# Patient Record
Sex: Female | Born: 1980 | Race: Black or African American | Hispanic: No | Marital: Single | Smoking: Former smoker
Health system: Southern US, Community
[De-identification: ages and names within clinical notes are randomized; demographics above are authoritative.]

## PROBLEM LIST (undated history)

## (undated) DIAGNOSIS — B009 Herpesviral infection, unspecified: Secondary | ICD-10-CM

## (undated) DIAGNOSIS — O139 Gestational [pregnancy-induced] hypertension without significant proteinuria, unspecified trimester: Secondary | ICD-10-CM

## (undated) DIAGNOSIS — R011 Cardiac murmur, unspecified: Secondary | ICD-10-CM

## (undated) DIAGNOSIS — G35 Multiple sclerosis: Secondary | ICD-10-CM

## (undated) DIAGNOSIS — Z332 Encounter for elective termination of pregnancy: Secondary | ICD-10-CM

## (undated) DIAGNOSIS — B999 Unspecified infectious disease: Secondary | ICD-10-CM

## (undated) DIAGNOSIS — D649 Anemia, unspecified: Secondary | ICD-10-CM

## (undated) DIAGNOSIS — E559 Vitamin D deficiency, unspecified: Secondary | ICD-10-CM

## (undated) DIAGNOSIS — R937 Abnormal findings on diagnostic imaging of other parts of musculoskeletal system: Secondary | ICD-10-CM

## (undated) HISTORY — PX: WISDOM TOOTH EXTRACTION: SHX21

## (undated) HISTORY — DX: Vitamin D deficiency, unspecified: E55.9

## (undated) HISTORY — PX: THERAPEUTIC ABORTION: SHX798

## (undated) HISTORY — PX: CHOLECYSTECTOMY: SHX55

## (undated) HISTORY — DX: Abnormal findings on diagnostic imaging of other parts of musculoskeletal system: R93.7

## (undated) HISTORY — PX: TUBAL LIGATION: SHX77

---

## 2004-04-04 ENCOUNTER — Emergency Department (HOSPITAL_COMMUNITY): Admission: EM | Admit: 2004-04-04 | Discharge: 2004-04-04 | Payer: Self-pay | Admitting: *Deleted

## 2004-08-05 ENCOUNTER — Emergency Department: Payer: Self-pay | Admitting: Emergency Medicine

## 2004-10-25 ENCOUNTER — Emergency Department: Payer: Self-pay | Admitting: Emergency Medicine

## 2005-02-11 ENCOUNTER — Emergency Department: Payer: Self-pay | Admitting: Emergency Medicine

## 2005-08-22 ENCOUNTER — Observation Stay: Payer: Self-pay | Admitting: Obstetrics and Gynecology

## 2005-09-26 ENCOUNTER — Observation Stay: Payer: Self-pay

## 2005-09-27 ENCOUNTER — Observation Stay: Payer: Self-pay | Admitting: Obstetrics and Gynecology

## 2005-09-28 ENCOUNTER — Observation Stay: Payer: Self-pay | Admitting: Obstetrics and Gynecology

## 2005-10-05 ENCOUNTER — Inpatient Hospital Stay: Payer: Self-pay

## 2006-10-25 ENCOUNTER — Observation Stay: Payer: Self-pay

## 2006-11-24 ENCOUNTER — Observation Stay: Payer: Self-pay

## 2006-12-21 ENCOUNTER — Other Ambulatory Visit: Payer: Self-pay

## 2006-12-21 ENCOUNTER — Inpatient Hospital Stay: Payer: Self-pay | Admitting: Obstetrics & Gynecology

## 2007-05-22 ENCOUNTER — Emergency Department: Payer: Self-pay | Admitting: Emergency Medicine

## 2009-03-19 ENCOUNTER — Emergency Department: Payer: Self-pay | Admitting: Emergency Medicine

## 2009-03-26 ENCOUNTER — Ambulatory Visit: Payer: Self-pay | Admitting: General Surgery

## 2009-03-31 ENCOUNTER — Ambulatory Visit: Payer: Self-pay | Admitting: General Surgery

## 2009-10-25 ENCOUNTER — Emergency Department: Payer: Self-pay | Admitting: Internal Medicine

## 2010-03-22 ENCOUNTER — Emergency Department: Payer: Self-pay | Admitting: Internal Medicine

## 2010-06-24 ENCOUNTER — Emergency Department: Payer: Self-pay | Admitting: Emergency Medicine

## 2011-01-18 ENCOUNTER — Emergency Department: Payer: Self-pay

## 2011-02-16 ENCOUNTER — Observation Stay: Payer: Self-pay

## 2011-03-17 ENCOUNTER — Observation Stay: Payer: Self-pay | Admitting: Obstetrics and Gynecology

## 2011-03-31 ENCOUNTER — Observation Stay: Payer: Self-pay

## 2011-04-05 ENCOUNTER — Encounter (HOSPITAL_COMMUNITY): Payer: Self-pay | Admitting: *Deleted

## 2011-04-05 ENCOUNTER — Emergency Department (HOSPITAL_COMMUNITY)
Admission: EM | Admit: 2011-04-05 | Discharge: 2011-04-05 | Disposition: A | Discharge status: Other Acute Inpatient Hospital | Payer: Medicaid Other | Attending: Emergency Medicine | Admitting: Emergency Medicine

## 2011-04-05 ENCOUNTER — Inpatient Hospital Stay (HOSPITAL_COMMUNITY)
Admission: AD | Admit: 2011-04-05 | Discharge: 2011-04-06 | Disposition: A | Discharge status: Home / Self Care | Payer: Medicaid Other | Source: Ambulatory Visit | Attending: Obstetrics and Gynecology | Admitting: Obstetrics and Gynecology

## 2011-04-05 ENCOUNTER — Inpatient Hospital Stay (HOSPITAL_COMMUNITY): Payer: Medicaid Other

## 2011-04-05 DIAGNOSIS — N390 Urinary tract infection, site not specified: Secondary | ICD-10-CM | POA: Insufficient documentation

## 2011-04-05 DIAGNOSIS — O4700 False labor before 37 completed weeks of gestation, unspecified trimester: Secondary | ICD-10-CM | POA: Insufficient documentation

## 2011-04-05 DIAGNOSIS — O2390 Unspecified genitourinary tract infection in pregnancy, unspecified trimester: Secondary | ICD-10-CM | POA: Insufficient documentation

## 2011-04-05 DIAGNOSIS — O2340 Unspecified infection of urinary tract in pregnancy, unspecified trimester: Secondary | ICD-10-CM

## 2011-04-05 HISTORY — DX: Anemia, unspecified: D64.9

## 2011-04-05 LAB — URINE MICROSCOPIC-ADD ON

## 2011-04-05 LAB — URINALYSIS, ROUTINE W REFLEX MICROSCOPIC
Glucose, UA: NEGATIVE mg/dL
Hgb urine dipstick: NEGATIVE
Specific Gravity, Urine: 1.025 (ref 1.005–1.030)
Urobilinogen, UA: 4 mg/dL — ABNORMAL HIGH (ref 0.0–1.0)

## 2011-04-05 LAB — WET PREP, GENITAL: Clue Cells Wet Prep HPF POC: NONE SEEN

## 2011-04-05 MED ORDER — BETAMETHASONE SOD PHOS & ACET 6 (3-3) MG/ML IJ SUSP
12.0000 mg | Freq: Once | INTRAMUSCULAR | Status: AC
  Administered 2011-04-06: 12 mg via INTRAMUSCULAR
  Filled 2011-04-05: qty 2

## 2011-04-05 MED ORDER — NITROFURANTOIN MONOHYD MACRO 100 MG PO CAPS
100.0000 mg | ORAL_CAPSULE | Freq: Once | ORAL | Status: AC
  Administered 2011-04-06: 100 mg via ORAL
  Filled 2011-04-05: qty 1

## 2011-04-05 MED ORDER — NITROFURANTOIN MONOHYD MACRO 100 MG PO CAPS: 100.0000 mg | ORAL_CAPSULE | Freq: Once | ORAL | Status: AC

## 2011-04-05 NOTE — Progress Notes (Signed)
[name] ED phoned OB RR RN for 32 wk 5 day G5P4 OB pt with c/o leaking fluid and contractions.

## 2011-04-05 NOTE — Progress Notes (Signed)
Arrived pt on EFM. MD in to assess pt. FHR 145 moderate variability. Toco adjusted.

## 2011-04-05 NOTE — Progress Notes (Signed)
Fern slide collected. No vaginal leakage noted on perineum. Fern negative.

## 2011-04-05 NOTE — H&P (Signed)
Physician Addendum to NP H&P.  Pt stated she felt like she leaked fluid tonight.  Good FM, no ctxes.  Was seen at [facility] for cervical change to 3 cm on 8-29 and given one dose of Betamethasone.  Left before second dose.  Filed Vitals:   04/05/11 1930 04/05/11 2018  BP:  139/54  Pulse:  79  Temp:  98.7 F (37.1 C)  TempSrc:  Oral  Resp: 20 18   FHTS NST R Toco none. SSE per NP negative for rupture. SVE by me confirmed ext os 3/int os 2/ long and high.  U/S AFI 16.3 and vertex.  Some funnel of cervix consistant with SVE.  A/P D/W no cervical change and no rupture.  Pt desires to go home with precautions.  Will finish BMZ course and start macrobid for UTI.  Denise Moses A

## 2011-04-05 NOTE — ED Provider Notes (Signed)
Denise Moses is a 30 y.o. female presenting for preterm labor. She was transferred to MAU at [facility] from [facility]. She has been receiving prenatal care  At [facility] in [place], but moved to [place] this weekend. She started cramping and passed her mucus plug this morning pain 7/10, then had a mod gush of clear fluid that splashed on the floor. She continued to leak and small amount of fluid throughout the day requiring that she change a panty liner 4 times. She has experienced frequency x 4 months. She denies VB, fever, chills, dysuria, hematuria, malodorous discharge. She states that her cervical exam last Wednesday 03/31/11 was 3 cm.  Maternal Medical History:  Reason for admission: Reason for admission: rupture of membranes and contractions.  Contractions: Onset was 6-12 hours ago.   Frequency: irregular.   Perceived severity is moderate.   Resolved spontaneously ~ 2 hours ago   Fetal activity: Perceived fetal activity is normal.    Prenatal complications: Preterm labor.   Prenatal Complications - Diabetes: none.    OB History    Grav Para Term Preterm Abortions TAB SAB Ect Mult Living   5 4 3 1      4      Past Medical History  Diagnosis Date  . Anemia   . Hypertension    Past Surgical History  Procedure Date  . Cholecystectomy    Family History: family history is not on file. Social History:  reports that she has been smoking.  She does not have any smokeless tobacco history on file. She reports that she does not drink alcohol or use illicit drugs.  Review of Systems  Constitutional: Negative for fever and chills.  Genitourinary: Positive for frequency. Negative for dysuria, urgency, hematuria and flank pain.    Dilation: 3 Effacement (%): Thick Station: -3 Exam by:: V Michell Giuliano CNM Blood pressure 139/54, pulse 79, temperature 98.7 F (37.1 C), temperature source Oral, resp. rate 18. Maternal Exam:  Uterine Assessment: Contraction strength is mild.  Contraction  frequency is rare.   Abdomen: Patient reports no abdominal tenderness. Fundal height is S=D.   Fetal presentation: vertex  Introitus: Normal vulva. Vagina is positive for vaginal discharge (mod amount of creamy, white, mildly malodorous discharge. Neg pooling.).  Ferning test: negative.   Pelvis: adequate for delivery.   Cervix: Cervix evaluated by sterile speculum exam and digital exam.     Fetal Exam Fetal Monitor Review: Mode: ultrasound.   Baseline rate: 130.  Variability: moderate (6-25 bpm).   Pattern: accelerations present and no decelerations.    Fetal State Assessment: Category I - tracings are normal.     Physical Exam  Constitutional: She is oriented to person, place, and time. She appears well-developed and well-nourished. No distress.  Cardiovascular: Normal rate.   Respiratory: Effort normal.  GI: Soft.  Genitourinary: Uterus is not tender. Cervix exhibits discharge (mucus). Cervix exhibits no friability. No bleeding around the vagina. Vaginal discharge (mod amount of creamy, white, mildly malodorous discharge. Neg pooling.) found.  Neurological: She is alert and oriented to person, place, and time.  Skin: Skin is warm and dry.  Psychiatric: She has a normal mood and affect.    Results for orders placed during the hospital encounter of 04/05/11 (from the past 24 hour(s))  AMNISURE RUPTURE OF MEMBRANE (ROM)     Status: Normal   Collection Time   04/05/11  8:45 PM      Component Value Range   Amnisure ROM NEGATIVE    WET  PREP, GENITAL     Status: Abnormal   Collection Time   04/05/11  8:45 PM      Component Value Range   Yeast, Wet Prep NONE SEEN  NONE SEEN    Trich, Wet Prep NONE SEEN  NONE SEEN    Clue Cells, Wet Prep NONE SEEN  NONE SEEN    WBC, Wet Prep HPF POC FEW (*) NONE SEEN   URINALYSIS, ROUTINE W REFLEX MICROSCOPIC     Status: Abnormal   Collection Time   04/05/11 10:00 PM      Component Value Range   Color, Urine YELLOW  YELLOW    Appearance CLEAR   CLEAR    Specific Gravity, Urine 1.025  1.005 - 1.030    pH 6.0  5.0 - 8.0    Glucose, UA NEGATIVE  NEGATIVE (mg/dL)   Hgb urine dipstick NEGATIVE  NEGATIVE    Bilirubin Urine NEGATIVE  NEGATIVE    Ketones, ur 40 (*) NEGATIVE (mg/dL)   Protein, ur NEGATIVE  NEGATIVE (mg/dL)   Urobilinogen, UA 4.0 (*) 0.0 - 1.0 (mg/dL)   Nitrite POSITIVE (*) NEGATIVE    Leukocytes, UA TRACE (*) NEGATIVE   URINE MICROSCOPIC-ADD ON     Status: Abnormal   Collection Time   04/05/11 10:00 PM      Component Value Range   Squamous Epithelial / LPF FEW (*) RARE    WBC, UA 7-10  <3 (WBC/hpf)   Bacteria, UA MANY (*) RARE    Urine-Other MUCOUS PRESENT     Ultrasound: AFI 16.3 cm. CL 1.2 cm w/ funnel length 2.3 cm and funnel width 1.3 cm. Some beaking visualized. The exam was dynamic. Cephalic presentation. Placenta anterior above os.      Prenatal labs: ABO, Rh:   Antibody:   Rubella:   RPR:    HBsAg:    HIV:    GBS:     Assessment/Plan: Assessment: 1. Preterm UC's w/ cervical change, funneling, dynamic cervix, digital cervical exam stable from exam 03/31/11. S/P BMZ x 1 per pt 2. FHR category I  Plan: 1. Per consult w/ Dr. Henderson Cloud, D/C home. 2. BMZ second dose. 3. F/U at [facility] as scheduled in one week 4. Macrobid first dose now and Rx for BID x 7 days. 5. PTL precautions   Aymee Fomby 04/05/2011, 9:20 PM

## 2011-04-05 NOTE — Progress Notes (Signed)
ED MD notified of Dr. Henderson Cloud request to transfer to [facility] for evaluation of PROM and PTL.

## 2011-04-11 ENCOUNTER — Observation Stay: Payer: Self-pay | Admitting: Obstetrics and Gynecology

## 2011-04-17 ENCOUNTER — Encounter (HOSPITAL_COMMUNITY): Payer: Self-pay | Admitting: *Deleted

## 2011-04-17 ENCOUNTER — Inpatient Hospital Stay (HOSPITAL_COMMUNITY)
Admission: AD | Admit: 2011-04-17 | Discharge: 2011-04-17 | Disposition: A | Discharge status: Home / Self Care | Payer: Medicaid Other | Source: Ambulatory Visit | Attending: Obstetrics and Gynecology | Admitting: Obstetrics and Gynecology

## 2011-04-17 DIAGNOSIS — O99891 Other specified diseases and conditions complicating pregnancy: Secondary | ICD-10-CM | POA: Insufficient documentation

## 2011-04-17 DIAGNOSIS — O09219 Supervision of pregnancy with history of pre-term labor, unspecified trimester: Secondary | ICD-10-CM

## 2011-04-17 DIAGNOSIS — O479 False labor, unspecified: Secondary | ICD-10-CM

## 2011-04-17 LAB — AMNISURE RUPTURE OF MEMBRANE (ROM) NOT AT ARMC: Amnisure ROM: NEGATIVE

## 2011-04-17 MED ORDER — LANSOPRAZOLE 15 MG PO CPDR: 15.0000 mg | DELAYED_RELEASE_CAPSULE | Freq: Every day | ORAL | Status: DC

## 2011-04-17 MED ORDER — ACETAMINOPHEN 325 MG PO TABS: 650.0000 mg | ORAL_TABLET | ORAL | Status: DC | PRN

## 2011-04-17 NOTE — Progress Notes (Signed)
Patient presents with c/o contractions, when she woke up this morning had fluid leaking down her legs X 2, [redacted] weeks pregnant gets prenatal care at [facility] in [place]

## 2011-04-17 NOTE — ED Provider Notes (Signed)
History     Chief Complaint  Patient presents with  . Contractions  . Vaginal Discharge   HPI Pt presents with complaint of worsening contractions and two gushes of white milky fluid per vagina this AM around 7. She has a history of preterm labor and delivery. She receives her prenatal care in [place], but has recently moved to [place] and plans  To deliver at [facility]. She states that contractions are irregular but cause moderate pain in her lower abdomen and back. She denies continuous leakage of fluid. She also denies vaginal bleeding. She admits to positive fetal movement but it is a bit decreased from baseline.   OB History    Grav Para Term Preterm Abortions TAB SAB Ect Mult Living   5 4 3 1      4       Past Medical History  Diagnosis Date  . Anemia   . Hypertension     Past Surgical History  Procedure Date  . Cholecystectomy     No family history on file.  History  Substance Use Topics  . Smoking status: Current Everyday Smoker -- 0.2 packs/day  . Smokeless tobacco: Not on file  . Alcohol Use: No    Allergies: No Known Allergies  Prescriptions prior to admission  Medication Sig Dispense Refill  . Iron Combinations (IRON COMPLEX PO) Take 1 tablet by mouth daily.        . prenatal vitamin w/FE, FA (PRENATAL 1 + 1) 27-1 MG TABS Take 1 tablet by mouth daily.          ROS As per HPI.  GI: reflux Otherwise negative  Physical Exam   Blood pressure 145/84, pulse 96, temperature 98.4 F (36.9 C), temperature source Oral, resp. rate 16, height 5' 5.5" (1.664 m), weight 228 lb 12.8 oz (103.783 kg).  Physical Exam  Nursing note and vitals reviewed. Constitutional: She is oriented to person, place, and time. She appears well-developed and well-nourished. No distress.  Cardiovascular: Normal heart sounds and intact distal pulses.   GI:       Gravid, vertex on Leopold's about 7.5 lbs. + FM.   Genitourinary: Vagina normal.  Musculoskeletal: Normal range of  motion.  Neurological: She is alert and oriented to person, place, and time.  Skin: Skin is warm and dry.   Dilation: 3 Effacement (%): Thick Station: -3 Presentation: Vertex Exam by:: Dr. Armen Pickup  Fetal monitoring: FHR:135, mod variablity, + accels, no decels.  Toco: Irreg q 5-8 MAU Course  Procedures Fern: negative  Amnisure: negative    Assessment and Plan  A: Multrigravida at 34w 5d presents for rule out rupture.  Rupture ruled out. No change in cervical exam. 1. D/C to home with labor precautions and instructions to obtain prenatal records.  2. Prevacid for reflux. 3. Tylenol prn pain.    Jameire Kouba 04/17/2011, 11:30 AM

## 2011-04-30 ENCOUNTER — Observation Stay: Payer: Self-pay | Admitting: Obstetrics and Gynecology

## 2011-05-06 ENCOUNTER — Encounter (HOSPITAL_COMMUNITY): Payer: Self-pay | Admitting: *Deleted

## 2011-05-06 ENCOUNTER — Inpatient Hospital Stay (HOSPITAL_COMMUNITY)
Admission: AD | Admit: 2011-05-06 | Discharge: 2011-05-06 | Disposition: A | Discharge status: Home / Self Care | Payer: Medicaid Other | Source: Ambulatory Visit | Attending: Obstetrics & Gynecology | Admitting: Obstetrics & Gynecology

## 2011-05-06 DIAGNOSIS — O479 False labor, unspecified: Secondary | ICD-10-CM | POA: Insufficient documentation

## 2011-05-06 HISTORY — DX: Gestational (pregnancy-induced) hypertension without significant proteinuria, unspecified trimester: O13.9

## 2011-05-06 LAB — WET PREP, GENITAL

## 2011-05-06 NOTE — ED Provider Notes (Signed)
History     Chief Complaint  Patient presents with  . Contractions   HPI  Pt arrived via EMS due to chasing dog that got out; while chasing dog felt like bag of water broke around 1230.  Shortly after began having contractions.  Feels the contractions have slowed down.  Denies vaginal bleeding.  Receives prenatal care in [place].    Past Medical History  Diagnosis Date  . Anemia   . Pregnancy induced hypertension     2001    Past Surgical History  Procedure Date  . Cholecystectomy   . No past surgeries     No family history on file.  History  Substance Use Topics  . Smoking status: Current Everyday Smoker -- 0.2 packs/day  . Smokeless tobacco: Not on file  . Alcohol Use: No    Allergies: No Known Allergies  Prescriptions prior to admission  Medication Sig Dispense Refill  . acetaminophen (TYLENOL) 325 MG tablet Take 2 tablets (650 mg total) by mouth every 4 (four) hours as needed for pain.  100 tablet  2  . Iron Combinations (IRON COMPLEX PO) Take 1 tablet by mouth daily.        . lansoprazole (PREVACID) 15 MG capsule Take 1 capsule (15 mg total) by mouth daily.  30 capsule  1  . prenatal vitamin w/FE, FA (PRENATAL 1 + 1) 27-1 MG TABS Take 1 tablet by mouth daily.          Review of Systems  Genitourinary:       Vaginal discharge  All other systems reviewed and are negative.   Physical Exam   Blood pressure 133/78, pulse 99, temperature 99 F (37.2 C), temperature source Oral, resp. rate 18.  Physical Exam  Constitutional: She is oriented to person, place, and time. She appears well-developed and well-nourished.  HENT:  Head: Normocephalic.  Neck: Normal range of motion. Neck supple.  Cardiovascular: Normal rate and regular rhythm.   Murmur (Grade I SEM) heard. Respiratory: Effort normal and breath sounds normal.  Genitourinary: No bleeding around the vagina. Vaginal discharge (mucusy; no pooling) found.  Neurological: She is alert and oriented to  person, place, and time. She has normal reflexes.  Skin: Skin is warm and dry.  Cervix - external os 3cm, internal os 1cm FHR 120's, +accels, reactive Toco 4-6/50/60  MAU Course  Procedures  Wet Prep - Neg for Trich, clue cells, yeast, +WBCs  Assessment and Plan  IUP @ 37.[redacted] wks EGA False Labor Labor precautions discussed; pt has appt to f/u at her provider in [place] next week.  [facility] 05/06/2011, 3:59 PM

## 2011-05-06 NOTE — Progress Notes (Signed)
Pt states her dog had got loose and she was trying to get the dog from outside and she felt a gush of fluid 'around 1200".

## 2011-05-13 ENCOUNTER — Observation Stay: Payer: Self-pay | Admitting: Obstetrics and Gynecology

## 2011-05-17 ENCOUNTER — Observation Stay: Payer: Self-pay

## 2011-05-19 ENCOUNTER — Inpatient Hospital Stay: Payer: Self-pay

## 2011-06-14 ENCOUNTER — Encounter (HOSPITAL_COMMUNITY): Payer: Self-pay | Admitting: *Deleted

## 2012-02-27 ENCOUNTER — Encounter (HOSPITAL_COMMUNITY): Payer: Self-pay | Admitting: *Deleted

## 2012-02-27 ENCOUNTER — Emergency Department (HOSPITAL_COMMUNITY)
Admission: EM | Admit: 2012-02-27 | Discharge: 2012-02-27 | Disposition: A | Discharge status: Home / Self Care | Payer: Self-pay | Attending: Emergency Medicine | Admitting: Emergency Medicine

## 2012-02-27 DIAGNOSIS — F17200 Nicotine dependence, unspecified, uncomplicated: Secondary | ICD-10-CM | POA: Insufficient documentation

## 2012-02-27 DIAGNOSIS — D649 Anemia, unspecified: Secondary | ICD-10-CM | POA: Insufficient documentation

## 2012-02-27 DIAGNOSIS — IMO0002 Reserved for concepts with insufficient information to code with codable children: Secondary | ICD-10-CM | POA: Insufficient documentation

## 2012-02-27 DIAGNOSIS — L02412 Cutaneous abscess of left axilla: Secondary | ICD-10-CM

## 2012-02-27 MED ORDER — TRAMADOL HCL 50 MG PO TABS: 50.0000 mg | ORAL_TABLET | Freq: Three times a day (TID) | ORAL | Status: AC | PRN

## 2012-02-27 MED ORDER — TRAMADOL HCL 50 MG PO TABS
50.0000 mg | ORAL_TABLET | Freq: Once | ORAL | Status: AC
  Administered 2012-02-27: 50 mg via ORAL
  Filled 2012-02-27: qty 1

## 2012-02-27 NOTE — ED Notes (Signed)
Reports having several knots under left arm that are causing pain, hx of same. No acute distress noted at triage.

## 2012-02-27 NOTE — ED Provider Notes (Signed)
History    Scribed for Gerhard Munch, MD, the patient was seen in room TR10C/TR10C. This chart was scribed by Katha Cabal.   CSN: [id number]  Arrival date & time 02/27/12  1045   First MD Initiated Contact with Patient 02/27/12 1106      Chief Complaint  Patient presents with  . Abscess    (Consider location/radiation/quality/duration/timing/severity/associated sxs/prior treatment) HPI Gerhard Munch, MD entered patient's room at 11:57 AM   Denise Moses is a 31 y.o. female who presents to the Emergency Department complaining of intermittent abcesses in left underarm for past few years.  This episode began 3 weeks ago and pain radiates from left armpit to chest.  Pain is moderate to severe.  Reports pain when trying to move left arm.  Patient tried warm compresses without relief.   Symptoms are not associated with fever, chills, abdominal pain, sob, disorientation or confusion.        Past Medical History  Diagnosis Date  . Anemia   . Pregnancy induced hypertension     2001    Past Surgical History  Procedure Date  . Cholecystectomy   . No past surgeries     History reviewed. No pertinent family history.  History  Substance Use Topics  . Smoking status: Current Everyday Smoker -- 0.2 packs/day  . Smokeless tobacco: Not on file  . Alcohol Use: No    OB History    Grav Para Term Preterm Abortions TAB SAB Ect Mult Living   5 4 3 1      4       Review of Systems  All other systems reviewed and are negative.  Remaining review of systems negative except as noted in the HPI.    Allergies  Review of patient's allergies indicates no known allergies.  Home Medications   Current Outpatient Rx  Name Route Sig Dispense Refill  . TRAMADOL HCL 50 MG PO TABS Oral Take 1 tablet (50 mg total) by mouth every 8 (eight) hours as needed for pain. 12 tablet 0    BP 120/58  Pulse 81  Temp 98.2 F (36.8 C) (Oral)  Resp 16  SpO2 94%  LMP 01/28/2012  Physical  Exam  Nursing note and vitals reviewed. Constitutional: She is oriented to person, place, and time. She appears well-developed and well-nourished. No distress.  HENT:  Head: Normocephalic and atraumatic.  Eyes: Conjunctivae and EOM are normal.  Cardiovascular: Normal rate and regular rhythm.   Pulmonary/Chest: Effort normal and breath sounds normal. No stridor. No respiratory distress.  Abdominal: She exhibits no distension.  Musculoskeletal: She exhibits no edema.  Neurological: She is alert and oriented to person, place, and time. No cranial nerve deficit.  Skin: Skin is warm and dry.       Left axilla: scattered areas fluctuants and induration, most inferior is freely draining pus  Psychiatric: She has a normal mood and affect.    ED Course  INCISION AND DRAINAGE Date/Time: 02/27/2012 1:00 PM Performed by: Gerhard Munch Authorized by: Gerhard Munch Consent: Verbal consent obtained. Written consent not obtained. The procedure was performed in an emergent situation. Risks and benefits: risks, benefits and alternatives were discussed Consent given by: patient Patient identity confirmed: verbally with patient Time out: Immediately prior to procedure a "time out" was called to verify the correct patient, procedure, equipment, support staff and site/side marked as required. Type: abscess Anesthesia: local infiltration Local anesthetic: lidocaine 1% with epinephrine Anesthetic total: 10 ml Patient sedated: no Scalpel size: 11  Incision type: single straight Complexity: complex Drainage: purulent Drainage amount: copious Wound treatment: wound left open Packing material: 1/4 in iodoform gauze Patient tolerance: Patient tolerated the procedure well with no immediate complications. Comments: Multiple abscesses were drained, and the most prominent had significant underlying septations.   (including critical care time)      COORDINATION OF CARE: 12:01 PM  Physical exam  complete.  Will perform  I&D.     1:29 PM   I&D procedure complete.  Procedure tolerated well. Plan to discharge patient.  Patient agrees with plan.      LABS / RADIOLOGY:   Labs Reviewed - No data to display No results found.       MDM  This generally well female presents with concern of left axillary pain, and on exam has multiple abscess use in her left arm.  The patient had incision and drainage of the most notable of these abscess use.  The procedure was well tolerated, and absent fever, other abnormal vital signs, and with little suspicion of distress or immunocompromised state was discharged in stable condition with antibiotics.  Patient will return in 2 days for a wound check.      MEDICATIONS GIVEN IN THE E.D. Scheduled Meds:    . traMADol  50 mg Oral Once   Continuous Infusions:      IMPRESSION: 1. Abscess of axilla, left      NEW MEDICATIONS: New Prescriptions   TRAMADOL (ULTRAM) 50 MG TABLET    Take 1 tablet (50 mg total) by mouth every 8 (eight) hours as needed for pain.      I personally performed the services described in this documentation, which was scribed in my presence. The recorded information has been reviewed and considered.     Gerhard Munch, MD 02/27/12 [phone]

## 2012-04-12 ENCOUNTER — Emergency Department (HOSPITAL_COMMUNITY)
Admission: EM | Admit: 2012-04-12 | Discharge: 2012-04-12 | Disposition: A | Discharge status: Home / Self Care | Payer: Self-pay | Attending: Emergency Medicine | Admitting: Emergency Medicine

## 2012-04-12 ENCOUNTER — Encounter (HOSPITAL_COMMUNITY): Payer: Self-pay | Admitting: Emergency Medicine

## 2012-04-12 DIAGNOSIS — IMO0002 Reserved for concepts with insufficient information to code with codable children: Secondary | ICD-10-CM | POA: Insufficient documentation

## 2012-04-12 DIAGNOSIS — F17200 Nicotine dependence, unspecified, uncomplicated: Secondary | ICD-10-CM | POA: Insufficient documentation

## 2012-04-12 DIAGNOSIS — D649 Anemia, unspecified: Secondary | ICD-10-CM | POA: Insufficient documentation

## 2012-04-12 DIAGNOSIS — L02412 Cutaneous abscess of left axilla: Secondary | ICD-10-CM

## 2012-04-12 MED ORDER — HYDROCODONE-ACETAMINOPHEN 5-325 MG PO TABS: 1.0000 | ORAL_TABLET | ORAL | Status: AC | PRN

## 2012-04-12 MED ORDER — HYDROCODONE-ACETAMINOPHEN 5-325 MG PO TABS
1.0000 | ORAL_TABLET | Freq: Once | ORAL | Status: AC
  Administered 2012-04-12: 1 via ORAL
  Filled 2012-04-12: qty 1

## 2012-04-12 NOTE — ED Notes (Signed)
C/o abscess under L arm x 2 days. 

## 2012-04-12 NOTE — ED Provider Notes (Signed)
History     CSN: [id number]  Arrival date & time 04/12/12  2029   First MD Initiated Contact with Patient 04/12/12 2041      Chief Complaint  Patient presents with  . Abscess    (Consider location/radiation/quality/duration/timing/severity/associated sxs/prior treatment) HPI History provided by pt.   Pt c/o abscess L axilla x 2 days.  Gradually increasing in size and severity of pain.  Has applied warm compresses but no drainage.  No associated fever and otherwise feeling well.  Has had multiple abscesses in the past.  Past Medical History  Diagnosis Date  . Anemia   . Pregnancy induced hypertension     2001    Past Surgical History  Procedure Date  . Cholecystectomy   . No past surgeries     No family history on file.  History  Substance Use Topics  . Smoking status: Current Every Day Smoker -- 0.2 packs/day  . Smokeless tobacco: Not on file  . Alcohol Use: No    OB History    Grav Para Term Preterm Abortions TAB SAB Ect Mult Living   5 4 3 1      4       Review of Systems  All other systems reviewed and are negative.    Allergies  Review of patient's allergies indicates no known allergies.  Home Medications  No current outpatient prescriptions on file.  BP 141/68  Pulse 73  Temp 98.4 F (36.9 C) (Oral)  Resp 18  SpO2 98%  LMP 03/25/2012  Physical Exam  Nursing note and vitals reviewed. Constitutional: She is oriented to person, place, and time. She appears well-developed and well-nourished. No distress.  HENT:  Head: Normocephalic and atraumatic.  Eyes:       Normal appearance  Neck: Normal range of motion.  Pulmonary/Chest: Effort normal.  Musculoskeletal: Normal range of motion.  Neurological: She is alert and oriented to person, place, and time.  Skin:       2x4cm abscess L axilla.  No surrounding erythema or induration.  Severely ttp.    Psychiatric: She has a normal mood and affect. Her behavior is normal.    ED Course  Procedures  (including critical care time)  INCISION AND DRAINAGE Performed by: Otilio Miu Consent: Verbal consent obtained. Risks and benefits: risks, benefits and alternatives were discussed Type: abscess  Body area: L axilla  Anesthesia: local infiltration  Local anesthetic: lidocaine 2%  W/ epinephrine  Anesthetic total: 7 ml  Complexity: complex Blunt dissection to break up loculations  Drainage: purulent  Drainage amount: large  Packing material: 1/4 in iodoform gauze  Patient tolerance: Patient tolerated the procedure well with no immediate complications.    Labs Reviewed - No data to display No results found.   1. Abscess of axilla, left       MDM  31yo F presents w/ abscess L axilla.  No surrounding cellulitis.  I&D'd and pt d/c'd home w/ vicodin for pain.  She will f/u with [facility] in 2 days.  Return precautions discussed.        Arie Sabina [place], [place] 04/12/12 2114

## 2012-04-12 NOTE — ED Notes (Signed)
Suture cart at the bedside, I&D tray ready for provider

## 2012-04-15 NOTE — ED Provider Notes (Signed)
Medical screening examination/treatment/procedure(s) were performed by non-physician practitioner and as supervising physician I was immediately available for consultation/collaboration.  Jones Skene, M.D.     Jones Skene, MD 04/15/12 1639

## 2012-08-05 ENCOUNTER — Encounter (HOSPITAL_COMMUNITY): Payer: Self-pay | Admitting: Adult Health

## 2012-08-05 DIAGNOSIS — Z9089 Acquired absence of other organs: Secondary | ICD-10-CM | POA: Insufficient documentation

## 2012-08-05 DIAGNOSIS — Z862 Personal history of diseases of the blood and blood-forming organs and certain disorders involving the immune mechanism: Secondary | ICD-10-CM | POA: Insufficient documentation

## 2012-08-05 DIAGNOSIS — IMO0002 Reserved for concepts with insufficient information to code with codable children: Secondary | ICD-10-CM | POA: Insufficient documentation

## 2012-08-05 DIAGNOSIS — F17200 Nicotine dependence, unspecified, uncomplicated: Secondary | ICD-10-CM | POA: Insufficient documentation

## 2012-08-05 DIAGNOSIS — Z8742 Personal history of other diseases of the female genital tract: Secondary | ICD-10-CM | POA: Insufficient documentation

## 2012-08-05 NOTE — ED Notes (Signed)
Presents with one week of softball sized abscess under left arm. Denies drainage. Redness and swelling noted.  Reports 3 days of chills and fevers.

## 2012-08-06 ENCOUNTER — Emergency Department (HOSPITAL_COMMUNITY)
Admission: EM | Admit: 2012-08-06 | Discharge: 2012-08-06 | Disposition: A | Discharge status: Home / Self Care | Payer: Medicaid Other | Attending: Emergency Medicine | Admitting: Emergency Medicine

## 2012-08-06 DIAGNOSIS — L02412 Cutaneous abscess of left axilla: Secondary | ICD-10-CM

## 2012-08-06 MED ORDER — SULFAMETHOXAZOLE-TMP DS 800-160 MG PO TABS
1.0000 | ORAL_TABLET | Freq: Once | ORAL | Status: AC
  Administered 2012-08-06: 1 via ORAL
  Filled 2012-08-06: qty 1

## 2012-08-06 MED ORDER — HYDROCODONE-ACETAMINOPHEN 5-500 MG PO TABS: 1.0000 | ORAL_TABLET | Freq: Four times a day (QID) | ORAL | Status: DC | PRN

## 2012-08-06 MED ORDER — CEPHALEXIN 250 MG PO CAPS
500.0000 mg | ORAL_CAPSULE | Freq: Once | ORAL | Status: AC
  Administered 2012-08-06: 500 mg via ORAL
  Filled 2012-08-06: qty 2

## 2012-08-06 MED ORDER — OXYCODONE-ACETAMINOPHEN 5-325 MG PO TABS
1.0000 | ORAL_TABLET | Freq: Once | ORAL | Status: AC
  Administered 2012-08-06: 1 via ORAL
  Filled 2012-08-06: qty 1

## 2012-08-06 MED ORDER — CEPHALEXIN 500 MG PO CAPS: 500.0000 mg | ORAL_CAPSULE | Freq: Four times a day (QID) | ORAL | Status: DC

## 2012-08-06 MED ORDER — SULFAMETHOXAZOLE-TRIMETHOPRIM 800-160 MG PO TABS: 1.0000 | ORAL_TABLET | Freq: Two times a day (BID) | ORAL | Status: DC

## 2012-08-06 NOTE — ED Provider Notes (Signed)
Medical screening examination/treatment/procedure(s) were performed by non-physician practitioner and as supervising physician I was immediately available for consultation/collaboration.   Lyanne Co, MD 08/06/12 [phone]

## 2012-08-06 NOTE — ED Provider Notes (Signed)
History     CSN: [id number]  Arrival date & time 08/05/12  2220   First MD Initiated Contact with Patient 08/06/12 0014      Chief Complaint  Patient presents with  . Abscess    (Consider location/radiation/quality/duration/timing/severity/associated sxs/prior treatment) HPI Denise Moses is a 32 y.o. female who presents to ED with complaint of an abscess to the left axilla. Pt states pain and swelling began about a week ago, worsened in last two days. States having chills at home, did not check her temperature. Pt has history of abscesses in the past, all in her axilla. States using warm compresses with no improvement. Denies drainage.     Past Medical History  Diagnosis Date  . Anemia   . Pregnancy induced hypertension     2001    Past Surgical History  Procedure Date  . Cholecystectomy   . No past surgeries     History reviewed. No pertinent family history.  History  Substance Use Topics  . Smoking status: Current Every Day Smoker -- 0.2 packs/day  . Smokeless tobacco: Not on file  . Alcohol Use: No    OB History    Grav Para Term Preterm Abortions TAB SAB Ect Mult Living   5 4 3 1      4       Review of Systems  Constitutional: Positive for chills. Negative for fever.  HENT: Negative for neck pain and neck stiffness.   Respiratory: Negative.   Cardiovascular: Negative.   Musculoskeletal: Negative for myalgias.  Skin: Positive for color change and wound.  Neurological: Negative for headaches.    Allergies  Review of patient's allergies indicates no known allergies.  Home Medications   Current Outpatient Rx  Name  Route  Sig  Dispense  Refill  . ACETAMINOPHEN 500 MG PO TABS   Oral   Take 1,000 mg by mouth every 6 (six) hours as needed. For pain         . IBUPROFEN 200 MG PO TABS   Oral   Take 600 mg by mouth every 6 (six) hours as needed. For pain           BP 129/63  Pulse 85  Temp 99.1 F (37.3 C) (Oral)  Resp 14  SpO2  100%  Physical Exam  Nursing note and vitals reviewed. Constitutional: She is oriented to person, place, and time. She appears well-developed and well-nourished. No distress.  HENT:  Head: Normocephalic.  Eyes: Conjunctivae normal are normal.  Neck: Neck supple.  Cardiovascular: Normal rate, regular rhythm and normal heart sounds.   Pulmonary/Chest: Effort normal and breath sounds normal. No respiratory distress. She has no wheezes. She has no rales.  Musculoskeletal: She exhibits no edema.  Neurological: She is alert and oriented to person, place, and time.  Skin: Skin is warm and dry.       About 4x4cm abscess to the left axilla, with mild surrounding cellulitis, extending about 2cm. Area tender, indurated, fluctuant.   Psychiatric: She has a normal mood and affect.    ED Course  Procedures (including critical care time)  Pt with large abscess to left axilla, hx of the same. Pt is afebrile, non toxic appearing.   INCISION AND DRAINAGE Performed by: Jaynie Crumble A Consent: Verbal consent obtained. Risks and benefits: risks, benefits and alternatives were discussed Type: abscess  Body area: left axilla  Anesthesia: local infiltration  Incision was made with a scalpel.  Local anesthetic: lidocaine 2% w epinephrine  Anesthetic total: 4 ml  Complexity: complex Blunt dissection to break up loculations  Drainage: purulent  Drainage amount: large  Packing material: 1/4 in iodoform gauze  Patient tolerance: Patient tolerated the procedure well with no immediate complications.     1. Abscess of left axilla       MDM  PT with large abscess to the left axilla. Abscess I&Ded. Started on antibiotics due to some cellulitis. Pt is non toxic appearing, afebrile. Will treat with antibiotics at home, pain medications, return in 48 hrs for recheck. Pt agrees with the plan.   Filed Vitals:   08/05/12 2240  BP: 129/63  Pulse: 85  Temp: 99.1 F (37.3 C)  Resp: 14            Mykle Pascua A Mike Hamre, PA 08/06/12 0130

## 2012-10-02 ENCOUNTER — Emergency Department (HOSPITAL_COMMUNITY)
Admission: EM | Admit: 2012-10-02 | Discharge: 2012-10-02 | Disposition: A | Discharge status: Home / Self Care | Payer: Medicaid Other | Attending: Emergency Medicine | Admitting: Emergency Medicine

## 2012-10-02 ENCOUNTER — Encounter (HOSPITAL_COMMUNITY): Payer: Self-pay | Admitting: *Deleted

## 2012-10-02 DIAGNOSIS — Z862 Personal history of diseases of the blood and blood-forming organs and certain disorders involving the immune mechanism: Secondary | ICD-10-CM | POA: Insufficient documentation

## 2012-10-02 DIAGNOSIS — IMO0002 Reserved for concepts with insufficient information to code with codable children: Secondary | ICD-10-CM | POA: Insufficient documentation

## 2012-10-02 DIAGNOSIS — F17200 Nicotine dependence, unspecified, uncomplicated: Secondary | ICD-10-CM | POA: Insufficient documentation

## 2012-10-02 MED ORDER — SULFAMETHOXAZOLE-TRIMETHOPRIM 800-160 MG PO TABS: 1.0000 | ORAL_TABLET | Freq: Two times a day (BID) | ORAL | Status: DC

## 2012-10-02 MED ORDER — HYDROCODONE-ACETAMINOPHEN 5-325 MG PO TABS: 2.0000 | ORAL_TABLET | ORAL | Status: DC | PRN

## 2012-10-02 NOTE — ED Notes (Signed)
Pt is here with bilateral multiple abscess under both arms.

## 2012-10-02 NOTE — ED Notes (Signed)
Patient advised she has a chronic problem with getting boils under her arms.  Patient says they are coming back and wanted to get an antibiotic.

## 2012-10-02 NOTE — ED Provider Notes (Signed)
History     CSN: [id number]  Arrival date & time 10/02/12  1237   First MD Initiated Contact with Patient 10/02/12 1351      Chief Complaint  Patient presents with  . Abscess    (Consider location/radiation/quality/duration/timing/severity/associated sxs/prior treatment) HPI Comments: This is a 32 year old female, who presents emergency department with chief complaint of abscess. Patient states that she has recurrent abscesses in bilateral axillas. Patient states that she just barely noticed some that are starting, and wanted to head them off before she needed to have them drained. She requests to try antibiotics and warm compresses. She states the pain is mild. She denies fever, nausea, and vomiting. The pain is worsened with movement.  The history is provided by the patient. No language interpreter was used.    Past Medical History  Diagnosis Date  . Anemia   . Pregnancy induced hypertension     2001    Past Surgical History  Procedure Laterality Date  . Cholecystectomy    . No past surgeries      No family history on file.  History  Substance Use Topics  . Smoking status: Current Every Day Smoker -- 0.25 packs/day  . Smokeless tobacco: Not on file  . Alcohol Use: No    OB History   Grav Para Term Preterm Abortions TAB SAB Ect Mult Living   5 4 3 1      4       Review of Systems  All other systems reviewed and are negative.    Allergies  Review of patient's allergies indicates no known allergies.  Home Medications   Current Outpatient Rx  Name  Route  Sig  Dispense  Refill  . ibuprofen (ADVIL,MOTRIN) 200 MG tablet   Oral   Take 600 mg by mouth every 6 (six) hours as needed for pain. For pain         . HYDROcodone-acetaminophen (NORCO/VICODIN) 5-325 MG per tablet   Oral   Take 2 tablets by mouth every 4 (four) hours as needed for pain.   15 tablet   0   . sulfamethoxazole-trimethoprim (SEPTRA DS) 800-160 MG per tablet   Oral   Take 1 tablet by  mouth every 12 (twelve) hours.   20 tablet   0     BP 124/73  Pulse 85  Temp(Src) 98.1 F (36.7 C) (Oral)  Resp 14  SpO2 99%  Physical Exam  Nursing note and vitals reviewed. Constitutional: She is oriented to person, place, and time. She appears well-developed and well-nourished.  HENT:  Head: Normocephalic and atraumatic.  Eyes: Conjunctivae and EOM are normal.  Neck: Normal range of motion.  Cardiovascular: Normal rate.   Pulmonary/Chest: Effort normal.  Abdominal: She exhibits no distension.  Musculoskeletal: Normal range of motion.  Left axilla: 1 x 2 cm abscess, without fluctuance, purulence, or surrounding erythema or signs of cellulitis  Right axilla: 2 very small abscesses, without fluctuance, purulence, or surrounding erythema or signs of cellulitis  Neurological: She is alert and oriented to person, place, and time.  Skin: Skin is dry.  Psychiatric: She has a normal mood and affect. Her behavior is normal. Judgment and thought content normal.    ED Course  Procedures (including critical care time)  Labs Reviewed - No data to display No results found.   1. Abscess       MDM  32 year old female with bilateral axillary abscesses. Patient states that she does not want to have them I and  D'd today. She should like to try antibiotics and warm compresses first. I told her that I cannot guarantee that the abscesses would resolve with antibiotics and warm compresses alone. I recommended that she have them drained today. She refused to have it drained. I will give her surgical followup. She understands and agrees with the plan.       Roxy Horseman, PA-C 10/02/12 1415

## 2012-10-05 NOTE — ED Provider Notes (Signed)
Medical screening examination/treatment/procedure(s) were performed by non-physician practitioner and as supervising physician I was immediately available for consultation/collaboration.   Richardean Canal, MD 10/05/12 0900

## 2012-10-10 ENCOUNTER — Emergency Department (HOSPITAL_COMMUNITY)
Admission: EM | Admit: 2012-10-10 | Discharge: 2012-10-10 | Disposition: A | Discharge status: Home / Self Care | Payer: Medicaid Other | Attending: Emergency Medicine | Admitting: Emergency Medicine

## 2012-10-10 ENCOUNTER — Encounter (HOSPITAL_COMMUNITY): Payer: Self-pay | Admitting: Nurse Practitioner

## 2012-10-10 DIAGNOSIS — IMO0002 Reserved for concepts with insufficient information to code with codable children: Secondary | ICD-10-CM | POA: Insufficient documentation

## 2012-10-10 DIAGNOSIS — F17200 Nicotine dependence, unspecified, uncomplicated: Secondary | ICD-10-CM | POA: Insufficient documentation

## 2012-10-10 DIAGNOSIS — Z79899 Other long term (current) drug therapy: Secondary | ICD-10-CM | POA: Insufficient documentation

## 2012-10-10 DIAGNOSIS — Z862 Personal history of diseases of the blood and blood-forming organs and certain disorders involving the immune mechanism: Secondary | ICD-10-CM | POA: Insufficient documentation

## 2012-10-10 MED ORDER — OXYCODONE-ACETAMINOPHEN 5-325 MG PO TABS: 1.0000 | ORAL_TABLET | Freq: Four times a day (QID) | ORAL | Status: DC | PRN

## 2012-10-10 MED ORDER — OXYCODONE-ACETAMINOPHEN 5-325 MG PO TABS
2.0000 | ORAL_TABLET | Freq: Once | ORAL | Status: AC
  Administered 2012-10-10: 2 via ORAL
  Filled 2012-10-10: qty 2

## 2012-10-10 NOTE — ED Provider Notes (Signed)
History  This chart was scribed for non-physician practitioner working with Raeford Razor, MD by Ardeen Jourdain, ED Scribe. This patient was seen in room TR10C/TR10C and the patient's care was started at 2112.  CSN: [id number]  Arrival date & time 10/10/12  1756   None     Chief Complaint  Patient presents with  . Wound Infection     Patient is a 32 y.o. female presenting with abscess. The history is provided by the patient. No language interpreter was used.  Abscess Location:  Shoulder/arm Shoulder/arm abscess location:  L axilla Abscess quality: painful   Abscess quality: not draining and no itching   Duration:  1 week Progression:  Worsening Pain details:    Quality:  Aching, dull and throbbing   Severity:  Moderate   Duration:  1 week   Timing:  Constant   Progression:  Worsening Chronicity:  Recurrent Relieved by:  Nothing Worsened by:  Draining/squeezing Ineffective treatments:  None tried Associated symptoms: no fatigue, no fever, no headaches, no nausea and no vomiting   Risk factors: prior abscess     Denise Moses is a 32 y.o. female who presents to the Emergency Department complaining of a gradually worsening abscess to her left axilla with associated pain that began last week. She states she was evaluated for the abscess last week and was prescribed antibiotics. She reports taking the antibiotics with no relief. Pt has a h/o similar abscesses. She states she has a number for a Careers adviser and is going to make an appointment soon.    Past Medical History  Diagnosis Date  . Anemia   . Pregnancy induced hypertension     2001    Past Surgical History  Procedure Laterality Date  . Cholecystectomy    . No past surgeries      History reviewed. No pertinent family history.  History  Substance Use Topics  . Smoking status: Current Every Day Smoker -- 0.25 packs/day  . Smokeless tobacco: Not on file  . Alcohol Use: No    OB History   Grav Para Term Preterm  Abortions TAB SAB Ect Mult Living   5 4 3 1      4       Review of Systems  Constitutional: Negative for fever, chills and fatigue.  Respiratory: Negative for shortness of breath.   Cardiovascular: Negative for chest pain.  Gastrointestinal: Negative for nausea and vomiting.  Skin:       Abscess   Neurological: Negative for weakness and headaches.  All other systems reviewed and are negative.    Allergies  Review of patient's allergies indicates no known allergies.  Home Medications   Current Outpatient Rx  Name  Route  Sig  Dispense  Refill  . HYDROcodone-acetaminophen (NORCO/VICODIN) 5-325 MG per tablet   Oral   Take 2 tablets by mouth every 4 (four) hours as needed for pain.   15 tablet   0   . ibuprofen (ADVIL,MOTRIN) 200 MG tablet   Oral   Take 600 mg by mouth every 6 (six) hours as needed for pain. For pain         . sulfamethoxazole-trimethoprim (SEPTRA DS) 800-160 MG per tablet   Oral   Take 1 tablet by mouth every 12 (twelve) hours.   20 tablet   0     Triage Vitals: BP 131/75  Pulse 96  Temp(Src) 98.1 F (36.7 C) (Oral)  Resp 20  SpO2 100%  Physical Exam  Nursing note and  vitals reviewed. Constitutional: She is oriented to person, place, and time. She appears well-developed and well-nourished. No distress.  HENT:  Head: Normocephalic and atraumatic.  Eyes: EOM are normal. Pupils are equal, round, and reactive to light.  Neck: Normal range of motion. Neck supple. No tracheal deviation present.  Cardiovascular: Normal rate, regular rhythm and normal heart sounds.   Pulmonary/Chest: Effort normal and breath sounds normal. No respiratory distress.  Abdominal: Soft. She exhibits no distension.  Musculoskeletal: Normal range of motion. She exhibits no edema.  Neurological: She is alert and oriented to person, place, and time.  Skin: Skin is warm and dry.  Abscess to left axilla with induration, 2 in by 1 in  Psychiatric: She has a normal mood and  affect. Her behavior is normal.    ED Course  Procedures (including critical care time) INCISION AND DRAINAGE Performed by: Jimmye Norman Consent: Verbal consent obtained. Risks and benefits: risks, benefits and alternatives were discussed Type: abscess  Body area: left axillar Anesthesia: local infiltration  Incision was made with a scalpel.  Local anesthetic: lidocaine 2% w/epinephrine  Anesthetic total: 4ml  Complexity: complex Blunt dissection to break up loculations  Drainage: purulent  Drainage amount: copious   Patient tolerance: Patient tolerated the procedure well with no immediate complications.   DIAGNOSTIC STUDIES: Oxygen Saturation is 100% on room air, normal by my interpretation.    COORDINATION OF CARE:  9:20 PM: Discussed treatment plan which includes pain medication and I&D  with pt at bedside and pt agreed to plan.    Labs Reviewed - No data to display No results found.   No diagnosis found.  Axillary abscess.  MDM    I personally performed the services described in this documentation, which was scribed in my presence. The recorded information has been reviewed and is accurate.        Jimmye Norman, NP 10/10/12 [phone]

## 2012-10-10 NOTE — ED Notes (Signed)
Pt called in main waiting area with no answer

## 2012-10-10 NOTE — ED Notes (Signed)
C/o abscess  under L armpit that is very painful since last week. Pt took abx prescribed by doctor here last week but abscess is worse

## 2012-10-11 NOTE — ED Provider Notes (Signed)
Medical screening examination/treatment/procedure(s) were performed by non-physician practitioner and as supervising physician I was immediately available for consultation/collaboration.  Raeford Razor, MD 10/11/12 [phone]

## 2013-03-31 ENCOUNTER — Encounter (HOSPITAL_COMMUNITY): Payer: Self-pay

## 2013-03-31 ENCOUNTER — Emergency Department (HOSPITAL_COMMUNITY)
Admission: EM | Admit: 2013-03-31 | Discharge: 2013-03-31 | Disposition: A | Discharge status: Home / Self Care | Payer: Medicaid Other | Attending: Emergency Medicine | Admitting: Emergency Medicine

## 2013-03-31 DIAGNOSIS — F17200 Nicotine dependence, unspecified, uncomplicated: Secondary | ICD-10-CM | POA: Insufficient documentation

## 2013-03-31 DIAGNOSIS — Z862 Personal history of diseases of the blood and blood-forming organs and certain disorders involving the immune mechanism: Secondary | ICD-10-CM | POA: Insufficient documentation

## 2013-03-31 DIAGNOSIS — L02411 Cutaneous abscess of right axilla: Secondary | ICD-10-CM

## 2013-03-31 DIAGNOSIS — IMO0002 Reserved for concepts with insufficient information to code with codable children: Secondary | ICD-10-CM | POA: Insufficient documentation

## 2013-03-31 MED ORDER — HYDROCODONE-ACETAMINOPHEN 5-325 MG PO TABS
2.0000 | ORAL_TABLET | Freq: Once | ORAL | Status: AC
  Administered 2013-03-31: 2 via ORAL
  Filled 2013-03-31: qty 2

## 2013-03-31 MED ORDER — HYDROCODONE-ACETAMINOPHEN 5-325 MG PO TABS: 1.0000 | ORAL_TABLET | Freq: Four times a day (QID) | ORAL | Status: DC | PRN

## 2013-03-31 NOTE — ED Provider Notes (Signed)
CSN: [id number]     Arrival date & time 03/31/13  4098 History   First MD Initiated Contact with Patient 03/31/13 [phone]     Chief Complaint  Patient presents with  . Abscess   (Consider location/radiation/quality/duration/timing/severity/associated sxs/prior Treatment) HPI Patient presents with pain in her right axilla.  She has a history of recurrent abscess ease.  She has not followed up with his specialists, as previously advised. This episode began approximately 3 days ago.  Since that time she has had increasing pain and that a focal abscess in her right axilla.  No new fever, chills, nausea, vomiting, chest pain, dyspnea, or any other complaints. Pain is minimally improved as the abscess drained spontaneously, but this stopped yesterday. Otherwise, no clear alleviating or exacerbating factors.  Past Medical History  Diagnosis Date  . Anemia   . Pregnancy induced hypertension     2001   Past Surgical History  Procedure Laterality Date  . Cholecystectomy    . No past surgeries     No family history on file. History  Substance Use Topics  . Smoking status: Current Every Day Smoker -- 0.25 packs/day  . Smokeless tobacco: Not on file  . Alcohol Use: No   OB History   Grav Para Term Preterm Abortions TAB SAB Ect Mult Living   5 4 3 1      4      Review of Systems  All other systems reviewed and are negative.    Allergies  Review of patient's allergies indicates no known allergies.  Home Medications  No current outpatient prescriptions on file. BP 135/75  Pulse 78  Temp(Src) 98.4 F (36.9 C) (Oral)  Resp 20  LMP 02/21/2013 Physical Exam  Nursing note and vitals reviewed. Constitutional: She is oriented to person, place, and time. She appears well-developed and well-nourished. No distress.  HENT:  Head: Normocephalic and atraumatic.  Eyes: Conjunctivae and EOM are normal.  Cardiovascular: Normal rate and regular rhythm.   Pulmonary/Chest: Effort normal and  breath sounds normal. No stridor. No respiratory distress.  Abdominal: She exhibits no distension.  Musculoskeletal: She exhibits no edema.  Neurological: She is alert and oriented to person, place, and time. No cranial nerve deficit.  Skin: Skin is warm and dry.  Right axillary abscess, approximately 4 cm x 1 cm, with minimal spontaneous drainage, fluctuant Minimal surrounding erythema  Psychiatric: She has a normal mood and affect.    ED Course  Procedures (including critical care time) Labs Review Labs Reviewed - No data to display Imaging Review No results found.  INCISION AND DRAINAGE Performed by: Gerhard Munch Consent: Verbal consent obtained. Risks and benefits: risks, benefits and alternatives were discussed Type: abscess  Body area: R axilla      Anesthesia: local infiltration   Incision was made with a  11 blade scalpel.  Local anesthetic: lidocaine 2% w/o epinephrine  Anesthetic total: 5 ml  Complexity: complex Blunt dissection to break up loculations  Drainage: purulent  Drainage amount: significant  Packing material: 1 in iodoform gauze  Patient tolerance: Patient tolerated the procedure well with no immediate complications.     MDM  No diagnosis found. Patient presents with right axillary abscess.  The patient has recurrent processes. This episode has not been complicated by fevers, chills, nausea, vomiting, surrounding erythema.  There is copious amounts of pus produced during incision and drainage of the wound.  With its location, the complexity, packing was indicated.  This should be removed in 2 days  and wound check if appropriate. Return precautions provided, patient discharged in stable condition.    Gerhard Munch, MD 03/31/13 1039

## 2013-03-31 NOTE — ED Notes (Signed)
Large abscess under rt. Arm draining a few days ago,. Stopped and has increased in size and intensity of pain

## 2013-03-31 NOTE — ED Notes (Signed)
Suture cart to bedside; MD informed

## 2013-07-16 ENCOUNTER — Encounter (HOSPITAL_COMMUNITY): Payer: Self-pay | Admitting: Emergency Medicine

## 2013-07-16 ENCOUNTER — Emergency Department (HOSPITAL_COMMUNITY)
Admission: EM | Admit: 2013-07-16 | Discharge: 2013-07-17 | Disposition: A | Discharge status: Home / Self Care | Payer: Medicaid Other | Attending: Emergency Medicine | Admitting: Emergency Medicine

## 2013-07-16 DIAGNOSIS — F17200 Nicotine dependence, unspecified, uncomplicated: Secondary | ICD-10-CM | POA: Insufficient documentation

## 2013-07-16 DIAGNOSIS — L02411 Cutaneous abscess of right axilla: Secondary | ICD-10-CM

## 2013-07-16 DIAGNOSIS — IMO0002 Reserved for concepts with insufficient information to code with codable children: Secondary | ICD-10-CM | POA: Insufficient documentation

## 2013-07-16 DIAGNOSIS — Z862 Personal history of diseases of the blood and blood-forming organs and certain disorders involving the immune mechanism: Secondary | ICD-10-CM | POA: Insufficient documentation

## 2013-07-16 MED ORDER — HYDROCODONE-ACETAMINOPHEN 5-325 MG PO TABS: 1.0000 | ORAL_TABLET | ORAL | Status: DC | PRN

## 2013-07-16 MED ORDER — OXYCODONE-ACETAMINOPHEN 5-325 MG PO TABS
1.0000 | ORAL_TABLET | Freq: Once | ORAL | Status: AC
  Administered 2013-07-16: 1 via ORAL
  Filled 2013-07-16: qty 1

## 2013-07-16 NOTE — Discharge Instructions (Signed)
Take vicodin as prescribed for severe pain.  Do not drive within four hours of taking this medication (may cause drowsiness or confusion).   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   Return to ER if you develop fever or spreading of redness around wound.

## 2013-07-16 NOTE — ED Notes (Signed)
Pt. reports abscess at right axilla onset yesterday with no drainage.

## 2013-07-16 NOTE — ED Provider Notes (Signed)
CSN: [id number]     Arrival date & time 07/16/13  2042 History   First MD Initiated Contact with Patient 07/16/13 2256     Chief Complaint  Patient presents with  . Abscess   (Consider location/radiation/quality/duration/timing/severity/associated sxs/prior Treatment) HPI History provided by pt.   Pt presents w/ abscess of right axilla since yesterday.  Severely painful.  Non-draining; has not applied warm compresses.  No associated fever or other skin changes.  This is a recurrent problem. Past Medical History  Diagnosis Date  . Anemia   . Pregnancy induced hypertension     2001   Past Surgical History  Procedure Laterality Date  . Cholecystectomy    . No past surgeries     No family history on file. History  Substance Use Topics  . Smoking status: Current Every Day Smoker -- 0.25 packs/day  . Smokeless tobacco: Not on file  . Alcohol Use: No   OB History   Grav Para Term Preterm Abortions TAB SAB Ect Mult Living   5 4 3 1      4      Review of Systems  All other systems reviewed and are negative.    Allergies  Review of patient's allergies indicates no known allergies.  Home Medications   Current Outpatient Rx  Name  Route  Sig  Dispense  Refill  . ibuprofen (ADVIL,MOTRIN) 200 MG tablet   Oral   Take 400 mg by mouth every 6 (six) hours as needed for moderate pain.         [name] HYDROcodone-acetaminophen (NORCO/VICODIN) 5-325 MG per tablet   Oral   Take 1 tablet by mouth every 4 (four) hours as needed for moderate pain.   15 tablet   0    BP 141/81  Pulse 79  Temp(Src) 98.1 F (36.7 C) (Oral)  Resp 18  Ht 5\' 6"  (1.676 m)  Wt 215 lb (97.523 kg)  BMI 34.72 kg/m2  SpO2 100%  LMP 07/01/2013 Physical Exam  Nursing note and vitals reviewed. Constitutional: She is oriented to person, place, and time. She appears well-developed and well-nourished. No distress.  HENT:  Head: Normocephalic and atraumatic.  Eyes:  Normal appearance  Neck: Normal range of  motion.  Pulmonary/Chest: Effort normal.  Musculoskeletal: Normal range of motion.  Neurological: She is alert and oriented to person, place, and time.  Skin:  1.5cm, fluctuant abscess right axilla.  No surrounding erythema or induration.  Very ttp.   Psychiatric: She has a normal mood and affect. Her behavior is normal.    ED Course  Procedures (including critical care time) INCISION AND DRAINAGE Performed by: Ruby Cola E Consent: Verbal consent obtained. Risks and benefits: risks, benefits and alternatives were discussed Type: abscess  Body area: right axilla  Anesthesia: local infiltration  Incision was made with a scalpel.  Local anesthetic: lidocaine 2% w/ epinephrine  Anesthetic total: 4 ml  Complexity: complex Blunt dissection to break up loculations  Drainage: purulent  Drainage amount: moderate  Packing material: none Patient tolerance: Patient tolerated the procedure well with no immediate complications.    Labs Review Labs Reviewed - No data to display Imaging Review No results found.  EKG Interpretation   None       MDM   1. Abscess of right axilla    32yo F presents w/ right axillary abscess w/out associated cellulitis.  This is a recurrent problem.  I&D'd and d/c'd home w/ 15 vicodin as well as referral to [facility].  Return precautions discussed.  11:58 PM   Otilio Miu, PA-C 07/16/13 2358

## 2013-07-17 LAB — GLUCOSE, CAPILLARY: Glucose-Capillary: 62 mg/dL — ABNORMAL LOW (ref 70–99)

## 2013-07-17 NOTE — ED Provider Notes (Signed)
Medical screening examination/treatment/procedure(s) were performed by non-physician practitioner and as supervising physician I was immediately available for consultation/collaboration.  EKG Interpretation   None         Loren Racer, MD 07/17/13 [phone]

## 2013-07-22 ENCOUNTER — Encounter (HOSPITAL_COMMUNITY): Payer: Self-pay | Admitting: Emergency Medicine

## 2013-07-22 ENCOUNTER — Emergency Department (HOSPITAL_COMMUNITY)
Admission: EM | Admit: 2013-07-22 | Discharge: 2013-07-23 | Disposition: A | Discharge status: Home / Self Care | Payer: Medicaid Other | Attending: Emergency Medicine | Admitting: Emergency Medicine

## 2013-07-22 DIAGNOSIS — R112 Nausea with vomiting, unspecified: Secondary | ICD-10-CM | POA: Insufficient documentation

## 2013-07-22 DIAGNOSIS — J111 Influenza due to unidentified influenza virus with other respiratory manifestations: Secondary | ICD-10-CM | POA: Insufficient documentation

## 2013-07-22 DIAGNOSIS — Z862 Personal history of diseases of the blood and blood-forming organs and certain disorders involving the immune mechanism: Secondary | ICD-10-CM | POA: Insufficient documentation

## 2013-07-22 DIAGNOSIS — F17200 Nicotine dependence, unspecified, uncomplicated: Secondary | ICD-10-CM | POA: Insufficient documentation

## 2013-07-22 DIAGNOSIS — R52 Pain, unspecified: Secondary | ICD-10-CM | POA: Insufficient documentation

## 2013-07-22 DIAGNOSIS — R197 Diarrhea, unspecified: Secondary | ICD-10-CM | POA: Insufficient documentation

## 2013-07-22 DIAGNOSIS — R000 Tachycardia, unspecified: Secondary | ICD-10-CM | POA: Insufficient documentation

## 2013-07-22 MED ORDER — ACETAMINOPHEN 325 MG PO TABS
650.0000 mg | ORAL_TABLET | Freq: Once | ORAL | Status: AC
  Administered 2013-07-22: 650 mg via ORAL

## 2013-07-22 NOTE — ED Notes (Signed)
The pt is c/o body   Aches headache  Diarrhea with shills .  Last fever reducer was yesterday lmp nov 24

## 2013-07-23 NOTE — ED Provider Notes (Signed)
CSN: [id number]     Arrival date & time 07/22/13  2143 History   First MD Initiated Contact with Patient 07/22/13 2343     Chief Complaint  Patient presents with  . multiple complaints    (Consider location/radiation/quality/duration/timing/severity/associated sxs/prior Treatment) HPI 32 yo female presents to the ER from home with complaint of 2 days of fever, body aches, headache, n/v/d, chills.  Pt last took tylenol yesterday.  No known sick contacts, no unusual foods, no travel.  No flu shot this year.  No neck stiffness, no confusion.  No n/v/d in last 12 hours.  Past Medical History  Diagnosis Date  . Anemia   . Pregnancy induced hypertension     2001   Past Surgical History  Procedure Laterality Date  . Cholecystectomy    . No past surgeries     No family history on file. History  Substance Use Topics  . Smoking status: Current Every Day Smoker -- 0.25 packs/day  . Smokeless tobacco: Not on file  . Alcohol Use: No   OB History   Grav Para Term Preterm Abortions TAB SAB Ect Mult Living   5 4 3 1      4      Review of Systems  All other systems reviewed and are negative.    Allergies  Review of patient's allergies indicates no known allergies.  Home Medications   Current Outpatient Rx  Name  Route  Sig  Dispense  Refill  . HYDROcodone-acetaminophen (NORCO/VICODIN) 5-325 MG per tablet   Oral   Take 1 tablet by mouth every 4 (four) hours as needed for moderate pain.   15 tablet   0   . ibuprofen (ADVIL,MOTRIN) 200 MG tablet   Oral   Take 400 mg by mouth every 6 (six) hours as needed for moderate pain.          BP 122/68  Pulse 89  Temp(Src) 99.4 F (37.4 C) (Oral)  Resp 22  Ht 5\' 6"  (1.676 m)  Wt 208 lb (94.348 kg)  BMI 33.59 kg/m2  SpO2 100%  LMP 07/01/2013 Physical Exam  Nursing note and vitals reviewed. Constitutional: She is oriented to person, place, and time. She appears well-developed and well-nourished. She appears distressed  (uncomfortable appearing).  HENT:  Head: Normocephalic and atraumatic.  Right Ear: External ear normal.  Left Ear: External ear normal.  Nose: Nose normal.  Mouth/Throat: Oropharynx is clear and moist.  Eyes: Conjunctivae and EOM are normal. Pupils are equal, round, and reactive to light.  Neck: Normal range of motion. Neck supple. No JVD present. No tracheal deviation present. No thyromegaly present.  No nuchal rigidity, normal ROM of neck  Cardiovascular: Regular rhythm, normal heart sounds and intact distal pulses.  Exam reveals no gallop and no friction rub.   No murmur heard. Tachycardia noted  Pulmonary/Chest: Effort normal and breath sounds normal. No stridor. No respiratory distress. She has no wheezes. She has no rales. She exhibits no tenderness.  Abdominal: Soft. Bowel sounds are normal. She exhibits no distension and no mass. There is no tenderness. There is no rebound and no guarding.  Musculoskeletal: Normal range of motion. She exhibits no edema and no tenderness.  Lymphadenopathy:    She has no cervical adenopathy.  Neurological: She is alert and oriented to person, place, and time. She exhibits normal muscle tone. Coordination normal.  Skin: Skin is warm and dry. No rash noted. No erythema. No pallor.  Psychiatric: She has a normal mood and  affect. Her behavior is normal. Judgment and thought content normal.    ED Course  Procedures (including critical care time) Labs Review Labs Reviewed  RESPIRATORY VIRUS PANEL   Imaging Review No results found.  EKG Interpretation   None       MDM   1. Influenza-like illness    32 year old female with a flu like illness or body aches, headache, fever, nausea, vomiting, and diarrhea.  No sick contacts.  No flu shot.  Patient refused influenza swab.  Physical exam unremarkable.  Recommended to alternate Tylenol and ibuprofen for fever and body aches.  Supportive care.    Olivia Mackie, MD 07/23/13 [phone]

## 2013-07-23 NOTE — ED Notes (Signed)
Pt refused influenza swab

## 2013-07-23 NOTE — ED Notes (Signed)
Pt requesting to leave due to wait.  Delay explained to pt by this RN.  Pt signed AMA form then agreed to wait 15 more minutes.  Dr. Norlene Campbell aware.

## 2013-11-11 ENCOUNTER — Encounter (HOSPITAL_COMMUNITY): Payer: Self-pay | Admitting: Emergency Medicine

## 2013-11-11 ENCOUNTER — Emergency Department (HOSPITAL_COMMUNITY)
Admission: EM | Admit: 2013-11-11 | Discharge: 2013-11-11 | Disposition: A | Discharge status: Home / Self Care | Payer: Medicaid Other | Attending: Emergency Medicine | Admitting: Emergency Medicine

## 2013-11-11 DIAGNOSIS — IMO0002 Reserved for concepts with insufficient information to code with codable children: Secondary | ICD-10-CM | POA: Insufficient documentation

## 2013-11-11 DIAGNOSIS — Z862 Personal history of diseases of the blood and blood-forming organs and certain disorders involving the immune mechanism: Secondary | ICD-10-CM | POA: Insufficient documentation

## 2013-11-11 DIAGNOSIS — F17200 Nicotine dependence, unspecified, uncomplicated: Secondary | ICD-10-CM | POA: Insufficient documentation

## 2013-11-11 DIAGNOSIS — L02412 Cutaneous abscess of left axilla: Secondary | ICD-10-CM

## 2013-11-11 MED ORDER — OXYCODONE-ACETAMINOPHEN 5-325 MG PO TABS: 1.0000 | ORAL_TABLET | ORAL | Status: DC | PRN

## 2013-11-11 MED ORDER — ONDANSETRON HCL 8 MG PO TABS
4.0000 mg | ORAL_TABLET | Freq: Once | ORAL | Status: AC
  Administered 2013-11-11: 4 mg via ORAL
  Filled 2013-11-11: qty 1

## 2013-11-11 MED ORDER — HYDROMORPHONE HCL PF 1 MG/ML IJ SOLN
1.0000 mg | Freq: Once | INTRAMUSCULAR | Status: AC
  Administered 2013-11-11: 1 mg via INTRAMUSCULAR
  Filled 2013-11-11: qty 1

## 2013-11-11 MED ORDER — SULFAMETHOXAZOLE-TRIMETHOPRIM 800-160 MG PO TABS: 1.0000 | ORAL_TABLET | Freq: Two times a day (BID) | ORAL | Status: DC

## 2013-11-11 NOTE — ED Provider Notes (Signed)
See prior note   Janice Norrie, MD 11/11/13 1544

## 2013-11-11 NOTE — Discharge Instructions (Signed)
Take the percocet for pain with ibuprofen 600 mg 4 times a day. Take the antibiotic until gone. The packing needs to be removed in about 2 days, you can pull it out while you are in the shower.  Recheck if it gets worse instead of better.    Abscess An abscess is an infected area that contains a collection of pus and debris.It can occur in almost any part of the body. An abscess is also known as a furuncle or boil. CAUSES  An abscess occurs when tissue gets infected. This can occur from blockage of oil or sweat glands, infection of hair follicles, or a minor injury to the skin. As the body tries to fight the infection, pus collects in the area and creates pressure under the skin. This pressure causes pain. People with weakened immune systems have difficulty fighting infections and get certain abscesses more often.  SYMPTOMS Usually an abscess develops on the skin and becomes a painful mass that is red, warm, and tender. If the abscess forms under the skin, you may feel a moveable soft area under the skin. Some abscesses break open (rupture) on their own, but most will continue to get worse without care. The infection can spread deeper into the body and eventually into the bloodstream, causing you to feel ill.  DIAGNOSIS  Your caregiver will take your medical history and perform a physical exam. A sample of fluid may also be taken from the abscess to determine what is causing your infection. TREATMENT  Your caregiver may prescribe antibiotic medicines to fight the infection. However, taking antibiotics alone usually does not cure an abscess. Your caregiver may need to make a small cut (incision) in the abscess to drain the pus. In some cases, gauze is packed into the abscess to reduce pain and to continue draining the area. HOME CARE INSTRUCTIONS   Only take over-the-counter or prescription medicines for pain, discomfort, or fever as directed by your caregiver.  If you were prescribed antibiotics,  take them as directed. Finish them even if you start to feel better.  If gauze is used, follow your caregiver's directions for changing the gauze.  To avoid spreading the infection:  Keep your draining abscess covered with a bandage.  Wash your hands well.  Do not share personal care items, towels, or whirlpools with others.  Avoid skin contact with others.  Keep your skin and clothes clean around the abscess.  Keep all follow-up appointments as directed by your caregiver. SEEK MEDICAL CARE IF:   You have increased pain, swelling, redness, fluid drainage, or bleeding.  You have muscle aches, chills, or a general ill feeling.  You have a fever. MAKE SURE YOU:   Understand these instructions.  Will watch your condition.  Will get help right away if you are not doing well or get worse. Document Released: 04/28/2005 Document Revised: 01/18/2012 Document Reviewed: 10/01/2011 [facility] Patient Information 2014 [place].  Abscess Care After An abscess (also called a boil or furuncle) is an infected area that contains a collection of pus. Signs and symptoms of an abscess include pain, tenderness, redness, or hardness, or you may feel a moveable soft area under your skin. An abscess can occur anywhere in the body. The infection may spread to surrounding tissues causing cellulitis. A cut (incision) by the surgeon was made over your abscess and the pus was drained out. Gauze may have been packed into the space to provide a drain that will allow the cavity to heal  from the inside outwards. The boil may be painful for 5 to 7 days. Most people with a boil do not have high fevers. Your abscess, if seen early, may not have localized, and may not have been lanced. If not, another appointment may be required for this if it does not get better on its own or with medications. HOME CARE INSTRUCTIONS   Only take over-the-counter or prescription medicines for pain, discomfort, or fever as  directed by your caregiver.  When you bathe, soak and then remove gauze or iodoform packs at least daily or as directed by your caregiver. You may then wash the wound gently with mild soapy water. Repack with gauze or do as your caregiver directs. SEEK IMMEDIATE MEDICAL CARE IF:   You develop increased pain, swelling, redness, drainage, or bleeding in the wound site.  You develop signs of generalized infection including muscle aches, chills, fever, or a general ill feeling.  An oral temperature above 102 F (38.9 C) develops, not controlled by medication. See your caregiver for a recheck if you develop any of the symptoms described above. If medications (antibiotics) were prescribed, take them as directed. Document Released: 02/04/2005 Document Revised: 10/11/2011 Document Reviewed: 10/02/2007 [facility] Patient Information 2014 [place].

## 2013-11-11 NOTE — ED Provider Notes (Signed)
This procedure was scribed for Denise Norrie, MD by Frederich Balding, ED Scribe. This patient was seen in room TR08C/TR08C and the patient's care was started at 11:23 AM.  Asked by PA to see patient because patient and brother would not let her proceed with her I & D. Pt has hx of abscesses in her axilla. States she doesn't use deoderant. States painful swelling in her left axilla for the past couple of days.  Pt has large abscess in her left axilla. There is a small incision at the peak of the swelling with some dried blood on the area.   Medications  HYDROmorphone (DILAUDID) injection 1 mg (1 mg Intramuscular Given 11/11/13 1043)  ondansetron (ZOFRAN) tablet 4 mg (4 mg Oral Given 11/11/13 1042)     Pt given IM pain meds. I and D done by me.      INCISION AND DRAINAGE Performed by: Rolland Porter, MD Consent: Verbal consent obtained. Risks and benefits: risks, benefits and alternatives were discussed Type: abscess  Body area: left axilla  Anesthesia: local infiltration  Incision was made with an 11 blade scalpel.  Local anesthetic: lidocaine 2% with 1% epinephrine  Anesthetic total: 4 ml  Complexity: complex Blunt dissection to break up loculations  Drainage: purulent  Drainage amount: copious  Packing material: 1/4 in iodoform gauze  Patient tolerance: Patient tolerated the procedure well with no immediate complications.  Diagnoses that have been ruled out:  None  Diagnoses that are still under consideration:  None  Final diagnoses:  Abscess of axilla, left     New Prescriptions   OXYCODONE-ACETAMINOPHEN (PERCOCET/ROXICET) 5-325 MG PER TABLET    Take 1 tablet by mouth every 4 (four) hours as needed for severe pain.   SULFAMETHOXAZOLE-TRIMETHOPRIM (SEPTRA DS) 800-160 MG PER TABLET    Take 1 tablet by mouth every 12 (twelve) hours.   Plan discharge  Rolland Porter, MD, [place] screening examination/treatment/procedure(s) were conducted as a shared visit with  non-physician practitioner(s) and myself.  I personally evaluated the patient during the encounter.   EKG Interpretation None      Rolland Porter, MD, FACEP    I personally performed the services described in this documentation, which was scribed in my presence. The recorded information has been reviewed and considered.  Rolland Porter, MD, FACEP   Denise Norrie, MD 11/11/13 1140

## 2013-11-11 NOTE — ED Notes (Signed)
Pt c/o painful abscess under L armpit for past few days. She applied warm compresses with no relief.

## 2013-11-11 NOTE — ED Provider Notes (Signed)
CSN: [id number]     Arrival date & time 11/11/13  2423 History  This chart was scribed for non-physician practitioner, Noland Fordyce, PA-C working with Janice Norrie, MD by Frederich Balding, ED scribe. This patient was seen in room TR08C/TR08C and the patient's care was started at 9:52 AM.   Chief Complaint  Patient presents with  . Wound Infection   The history is provided by the patient. No language interpreter was used.   HPI Comments: Denise Moses is a 33 y.o. female who presents to the Emergency Department complaining of an abscess to her left axilla that started 4 days ago. She has pain around the area and rates it 8/10. Pt has a history of the same and has had them packed in the past. She has done warm compresses with no relief. Denies fever, nausea.   Past Medical History  Diagnosis Date  . Anemia   . Pregnancy induced hypertension     2001   Past Surgical History  Procedure Laterality Date  . Cholecystectomy    . No past surgeries     History reviewed. No pertinent family history. History  Substance Use Topics  . Smoking status: Current Every Day Smoker -- 0.25 packs/day  . Smokeless tobacco: Not on file  . Alcohol Use: No   OB History   Grav Para Term Preterm Abortions TAB SAB Ect Mult Living   5 4 3 1      4      Review of Systems  Constitutional: Negative for fever.  Gastrointestinal: Negative for nausea.  Skin:       Abscess.  All other systems reviewed and are negative.  Allergies  Review of patient's allergies indicates no known allergies.  Home Medications   Current Outpatient Rx  Name  Route  Sig  Dispense  Refill  . oxyCODONE-acetaminophen (PERCOCET/ROXICET) 5-325 MG per tablet   Oral   Take 1 tablet by mouth every 4 (four) hours as needed for severe pain.   12 tablet   0   . sulfamethoxazole-trimethoprim (SEPTRA DS) 800-160 MG per tablet   Oral   Take 1 tablet by mouth every 12 (twelve) hours.   20 tablet   0    BP 137/70  Pulse 79   Temp(Src) 98.4 F (36.9 C) (Oral)  Resp 16  Ht 5\' 5"  (1.651 m)  Wt 208 lb (94.348 kg)  BMI 34.61 kg/m2  SpO2 100%  Physical Exam  Nursing note and vitals reviewed. Constitutional: She is oriented to person, place, and time. She appears well-developed and well-nourished.  HENT:  Head: Normocephalic and atraumatic.  Eyes: EOM are normal.  Neck: Normal range of motion.  Cardiovascular: Normal rate.   Pulmonary/Chest: Effort normal.  Musculoskeletal: Normal range of motion.  Neurological: She is alert and oriented to person, place, and time.  Skin: Skin is warm and dry.  3-4 cm area of induration and erythema to left axilla. Mild fluctuance noted.   Psychiatric: She has a normal mood and affect. Her behavior is normal.    ED Course  Procedures (including critical care time)  DIAGNOSTIC STUDIES: Oxygen Saturation is 100% on RA, normal by my interpretation.    COORDINATION OF CARE: 9:54 AM-Discussed treatment plan which includes I&D with pt at bedside and pt agreed to plan.   10:07 AM-Pt refused to finish the I&D procedure but then agreed to have attending try I&D as family member was trying to instruct PA-C on how to preform procedure.  INCISION AND DRAINAGE Performed by: Noland Fordyce, PA-C Consent: Verbal consent obtained. Risks and benefits: risks, benefits and alternatives were discussed Type: abscess  Body area: left axilla  Anesthesia: local infiltration  Incision was made with a scalpel.  Local anesthetic: lidocaine 2% without epinephrine  Anesthetic total: 1.5 ml  Rest of I&D preformed by Dr. Rolland Porter, per pt's request. See Dr. Johnsie Kindred note.   Labs Review Labs Reviewed - No data to display Imaging Review No results found.   EKG Interpretation None      MDM   Final diagnoses:  Abscess of axilla, left    I personally performed the services described in this documentation, which was scribed in my presence. The recorded information has been  reviewed and is accurate.  Noland Fordyce, PA-C 11/11/13 1145

## 2014-02-24 ENCOUNTER — Emergency Department (HOSPITAL_COMMUNITY)
Admission: EM | Admit: 2014-02-24 | Discharge: 2014-02-24 | Disposition: A | Discharge status: Home / Self Care | Payer: Medicaid Other | Attending: Emergency Medicine | Admitting: Emergency Medicine

## 2014-02-24 ENCOUNTER — Encounter (HOSPITAL_COMMUNITY): Payer: Self-pay | Admitting: Emergency Medicine

## 2014-02-24 ENCOUNTER — Emergency Department (HOSPITAL_COMMUNITY): Payer: Medicaid Other

## 2014-02-24 DIAGNOSIS — Z8679 Personal history of other diseases of the circulatory system: Secondary | ICD-10-CM | POA: Insufficient documentation

## 2014-02-24 DIAGNOSIS — M9985 Other biomechanical lesions of pelvic region: Secondary | ICD-10-CM

## 2014-02-24 DIAGNOSIS — F17200 Nicotine dependence, unspecified, uncomplicated: Secondary | ICD-10-CM | POA: Diagnosis not present

## 2014-02-24 DIAGNOSIS — Z3202 Encounter for pregnancy test, result negative: Secondary | ICD-10-CM | POA: Diagnosis not present

## 2014-02-24 DIAGNOSIS — M955 Acquired deformity of pelvis: Secondary | ICD-10-CM | POA: Insufficient documentation

## 2014-02-24 DIAGNOSIS — R109 Unspecified abdominal pain: Secondary | ICD-10-CM | POA: Insufficient documentation

## 2014-02-24 DIAGNOSIS — Z862 Personal history of diseases of the blood and blood-forming organs and certain disorders involving the immune mechanism: Secondary | ICD-10-CM | POA: Diagnosis not present

## 2014-02-24 DIAGNOSIS — M549 Dorsalgia, unspecified: Secondary | ICD-10-CM | POA: Insufficient documentation

## 2014-02-24 DIAGNOSIS — Z9089 Acquired absence of other organs: Secondary | ICD-10-CM | POA: Insufficient documentation

## 2014-02-24 LAB — COMPREHENSIVE METABOLIC PANEL
ALT: 6 U/L (ref 0–35)
AST: 11 U/L (ref 0–37)
Albumin: 3.8 g/dL (ref 3.5–5.2)
Alkaline Phosphatase: 54 U/L (ref 39–117)
Anion gap: 14 (ref 5–15)
BILIRUBIN TOTAL: 0.4 mg/dL (ref 0.3–1.2)
BUN: 5 mg/dL — ABNORMAL LOW (ref 6–23)
CALCIUM: 9.1 mg/dL (ref 8.4–10.5)
CHLORIDE: 104 meq/L (ref 96–112)
CO2: 21 meq/L (ref 19–32)
CREATININE: 0.62 mg/dL (ref 0.50–1.10)
GLUCOSE: 84 mg/dL (ref 70–99)
Potassium: 3.9 mEq/L (ref 3.7–5.3)
Sodium: 139 mEq/L (ref 137–147)
Total Protein: 7.1 g/dL (ref 6.0–8.3)

## 2014-02-24 LAB — URINALYSIS, ROUTINE W REFLEX MICROSCOPIC
Bilirubin Urine: NEGATIVE
GLUCOSE, UA: NEGATIVE mg/dL
HGB URINE DIPSTICK: NEGATIVE
KETONES UR: NEGATIVE mg/dL
Leukocytes, UA: NEGATIVE
Nitrite: NEGATIVE
PROTEIN: NEGATIVE mg/dL
Specific Gravity, Urine: 1.018 (ref 1.005–1.030)
UROBILINOGEN UA: 1 mg/dL (ref 0.0–1.0)
pH: 7.5 (ref 5.0–8.0)

## 2014-02-24 LAB — WET PREP, GENITAL
TRICH WET PREP: NONE SEEN
Yeast Wet Prep HPF POC: NONE SEEN

## 2014-02-24 LAB — CBC WITH DIFFERENTIAL/PLATELET
Basophils Absolute: 0.1 10*3/uL (ref 0.0–0.1)
Basophils Relative: 1 % (ref 0–1)
EOS PCT: 4 % (ref 0–5)
Eosinophils Absolute: 0.3 10*3/uL (ref 0.0–0.7)
HEMATOCRIT: 33.9 % — AB (ref 36.0–46.0)
HEMOGLOBIN: 11 g/dL — AB (ref 12.0–15.0)
LYMPHS ABS: 2.4 10*3/uL (ref 0.7–4.0)
LYMPHS PCT: 38 % (ref 12–46)
MCH: 27.2 pg (ref 26.0–34.0)
MCHC: 32.4 g/dL (ref 30.0–36.0)
MCV: 83.7 fL (ref 78.0–100.0)
MONO ABS: 0.3 10*3/uL (ref 0.1–1.0)
Monocytes Relative: 5 % (ref 3–12)
NEUTROS ABS: 3.3 10*3/uL (ref 1.7–7.7)
Neutrophils Relative %: 52 % (ref 43–77)
Platelets: 236 10*3/uL (ref 150–400)
RBC: 4.05 MIL/uL (ref 3.87–5.11)
RDW: 16.8 % — ABNORMAL HIGH (ref 11.5–15.5)
WBC: 6.3 10*3/uL (ref 4.0–10.5)

## 2014-02-24 LAB — HIV ANTIBODY (ROUTINE TESTING W REFLEX): HIV: NONREACTIVE

## 2014-02-24 LAB — PREGNANCY, URINE: PREG TEST UR: NEGATIVE

## 2014-02-24 LAB — RPR

## 2014-02-24 MED ORDER — HYDROCODONE-ACETAMINOPHEN 5-325 MG PO TABS: 1.0000 | ORAL_TABLET | ORAL | Status: DC | PRN

## 2014-02-24 MED ORDER — MORPHINE SULFATE 4 MG/ML IJ SOLN
4.0000 mg | Freq: Once | INTRAMUSCULAR | Status: AC
  Administered 2014-02-24: 4 mg via INTRAVENOUS
  Filled 2014-02-24: qty 1

## 2014-02-24 MED ORDER — IOHEXOL 300 MG/ML  SOLN
100.0000 mL | Freq: Once | INTRAMUSCULAR | Status: AC | PRN
  Administered 2014-02-24: 100 mL via INTRAVENOUS

## 2014-02-24 MED ORDER — IOHEXOL 300 MG/ML  SOLN
25.0000 mL | Freq: Once | INTRAMUSCULAR | Status: AC | PRN
  Administered 2014-02-24: 25 mL via ORAL

## 2014-02-24 NOTE — ED Notes (Signed)
Informed CT that pt is done with contrast

## 2014-02-24 NOTE — ED Provider Notes (Signed)
CSN: [id number]     Arrival date & time 02/24/14  4627 History   First MD Initiated Contact with Patient 02/24/14 [phone]     Chief Complaint  Patient presents with  . Abdominal Pain  . Back Pain   HPI  Denise Moses is a 33 y.o. female with a PMH of anemia and pregnancy induced HTN who presents to the ED for evaluation of abdominal and back pain. History was provided by the patient. Patient has a 2 day hx of lower abdominal pain, which radiates to her lower back bilaterally (L=R). Pain is a constant cramping and sharp pain. Nothing makes her pain better/worse. Patient took Tylenol with temporary relief in her pain. She denies similar pain in the past. Previous abdominal surgeries include a cholecystectomy. She has mild white vaginal discharge with no odor. Patient is currently sexually active with no new sexual partners. LNMP 02/09/14. Patient had an induced abortion two months ago. She denies any vaginal bleeding/spotting, fever, chills, nausea, emesis, diarrhea, constipation, dysuria, hematuria.      Past Medical History  Diagnosis Date  . Anemia   . Pregnancy induced hypertension     2001   Past Surgical History  Procedure Laterality Date  . Cholecystectomy    . No past surgeries     History reviewed. No pertinent family history. History  Substance Use Topics  . Smoking status: Current Every Day Smoker -- 0.25 packs/day  . Smokeless tobacco: Not on file  . Alcohol Use: Yes   OB History   Grav Para Term Preterm Abortions TAB SAB Ect Mult Living   _0 Review of Systems  Constitutional: Negative for fever, chills, activity change, appetite change and fatigue.  Cardiovascular: Negative for chest pain.  Gastrointestinal: Positive for abdominal pain. Negative for nausea, vomiting, diarrhea, constipation, blood in stool and rectal pain.  Genitourinary: Positive for pelvic pain. Negative for dysuria, urgency, frequency, hematuria, vaginal bleeding, vaginal  discharge, difficulty urinating, vaginal pain and menstrual problem.  Musculoskeletal: Positive for back pain. Negative for myalgias.  Neurological: Negative for dizziness, weakness, light-headedness and headaches.    Allergies  Review of patient's allergies indicates no known allergies.  Home Medications   Prior to Admission medications   Not on File   BP 146/83  Pulse 74  Temp(Src) 98.3 F (36.8 C) (Oral)  Resp 20  SpO2 99%  LMP 02/09/2014  Filed Vitals:   02/24/14 1359 02/24/14 1430 02/24/14 1530 02/24/14 1655  BP: 131/65 136/81 116/64 140/73  Pulse: 59 60 52 57  Temp:    98.2 F (36.8 C)  TempSrc:    Oral  Resp: _1 SpO2: 99% 99% 98% 100%    Physical Exam  Nursing note and vitals reviewed. Constitutional: She is oriented to person, place, and time. She appears well-developed and well-nourished. No distress.  Non-toxic  HENT:  Head: Normocephalic and atraumatic.  Right Ear: External ear normal.  Left Ear: External ear normal.  Mouth/Throat: Oropharynx is clear and moist.  Eyes: Conjunctivae are normal. Right eye exhibits no discharge. Left eye exhibits no discharge.  Neck: Neck supple.  Cardiovascular: Normal rate, regular rhythm and normal heart sounds.  Exam reveals no gallop and no friction rub.   No murmur heard. Pulmonary/Chest: Effort normal and breath sounds normal. No respiratory distress. She has no wheezes. She has no rales. She exhibits no tenderness.  Abdominal: Soft. She exhibits no distension  and no mass. There is tenderness. There is no rebound and no guarding.  Mild middle pelvic tenderness. No RLQ or LLQ tenderness to palpation.   Genitourinary:  Mild amount of thin white discharge present in the vaginal vault. Mild tenderness to the right adnexa. No CMT or left adnexal tenderness. Normal external genitalia.   Musculoskeletal: Normal range of motion. She exhibits no edema and no tenderness.  No CVA, lumbar, or flank tenderness  bilaterally.   Neurological: She is alert and oriented to person, place, and time.  Skin: Skin is warm and dry. She is not diaphoretic.    ED Course  Procedures (including critical care time) Labs Review Labs Reviewed - No data to display  Imaging Review US Transvaginal Non-ob  02/24/2014   CLINICAL DATA:  33 year old female with pelvic pain.  EXAM: TRANSABDOMINAL AND TRANSVAGINAL ULTRASOUND OF PELVIS  TECHNIQUE: Both transabdominal and transvaginal ultrasound examinations of the pelvis were performed. Transabdominal technique was performed for global imaging of the pelvis including uterus, ovaries, adnexal regions, and pelvic cul-de-sac. It was necessary to proceed with endovaginal exam following the transabdominal exam to visualize the ovaries and endometrium.  COMPARISON:  None  FINDINGS: Uterus  Measurements: Anteverted measuring 8.9 x 4.2 x 5.6 cm. No fibroids or other mass visualized.  Endometrium  Thickness: 7 mm. A small amount of fluid within the endometrial canal noted. No focal abnormality visualized.  Right ovary  Measurements: 4.1 x 3 x 3 cm. A corpus luteum is present. Normal appearance/no adnexal mass.  Left ovary  Measurements: 2.5 x 1.8 x 2.1 cm. Normal appearance/no adnexal mass.  Other findings  A small amount of free pelvic fluid may be physiologic. There is no evidence of adnexal mass.  IMPRESSION: Small amount of free pelvic fluid which may be physiologic.  Small amount of nonspecific fluid/ blood within the endometrial canal. Normal appearing endometrium.  Unremarkable ovaries.   Electronically Signed   By: Hassan Rowan M.D.   On: 02/24/2014 13:38   US Pelvis Complete  02/24/2014   CLINICAL DATA:  33 year old female with pelvic pain.  EXAM: TRANSABDOMINAL AND TRANSVAGINAL ULTRASOUND OF PELVIS  TECHNIQUE: Both transabdominal and transvaginal ultrasound examinations of the pelvis were performed. Transabdominal technique was performed for global imaging of the pelvis including uterus,  ovaries, adnexal regions, and pelvic cul-de-sac. It was necessary to proceed with endovaginal exam following the transabdominal exam to visualize the ovaries and endometrium.  COMPARISON:  None  FINDINGS: Uterus  Measurements: Anteverted measuring 8.9 x 4.2 x 5.6 cm. No fibroids or other mass visualized.  Endometrium  Thickness: 7 mm. A small amount of fluid within the endometrial canal noted. No focal abnormality visualized.  Right ovary  Measurements: 4.1 x 3 x 3 cm. A corpus luteum is present. Normal appearance/no adnexal mass.  Left ovary  Measurements: 2.5 x 1.8 x 2.1 cm. Normal appearance/no adnexal mass.  Other findings  A small amount of free pelvic fluid may be physiologic. There is no evidence of adnexal mass.  IMPRESSION: Small amount of free pelvic fluid which may be physiologic.  Small amount of nonspecific fluid/ blood within the endometrial canal. Normal appearing endometrium.  Unremarkable ovaries.   Electronically Signed   By: Hassan Rowan M.D.   On: 02/24/2014 13:38   Ct Abdomen Pelvis W Contrast  02/24/2014   CLINICAL DATA:  Two day history of lower abdominal pain which radiates and the back.  EXAM: CT ABDOMEN AND PELVIS WITH CONTRAST  TECHNIQUE: Multidetector CT imaging of  the abdomen and pelvis was performed using the standard protocol following bolus administration of intravenous contrast.  CONTRAST:  135m OMNIPAQUE IOHEXOL 300 MG/ML SOLN, 29mOMNIPAQUE IOHEXOL 300 MG/ML SOLN  COMPARISON:  No priors.  FINDINGS: Lung Bases: Trace bilateral pleural effusions. Minimal dependent subsegmental atelectasis in the lower lobes of the lungs bilaterally.  Abdomen/Pelvis: Two sub cm low-attenuation lesions are noted in the liver, too small to characterize, but favored to represent tiny cysts. Status post cholecystectomy. Common bile duct measures up to 9 mm in the porta hepatis, favored to reflect functional bile duct dilatation in a postcholecystectomy patient. No significant intrahepatic biliary ductal  dilatation. Pancreatic duct is not dilated. The appearance of the pancreas, bilateral adrenal glands and bilateral kidneys is unremarkable. There is a well-defined macrolobulated 3.8 x 2.9 cm lesion in the spleen anteriorly, which is indeterminate.  Normal appendix. No significant volume of ascites. No pneumoperitoneum. No pathologic distention of small bowel. No lymphadenopathy identified in the abdomen or pelvis. Uterus and ovaries are unremarkable in appearance. Urinary bladder is normal in appearance.  Musculoskeletal: There are numerous sclerotic lesions throughout the pelvis. The largest of these are centered around the right sacroiliac joint. No definite erosions are noted associated with the right sacroiliac joint. There is a mild irregularity along the anterior aspect of the right sacral ala, which could indicate an old sacral ala fracture, in which case this could be a hyperostotic response to prior trauma. However, there are additional sclerotic lesions in the pelvis bilaterally (right greater than left). While several of the smaller lesions are very dense and have sclerotic margins, others are less sclerotic, more ill-defined and do not have an appearance compatible with a typical bone island. No definite osseous lesions are noted in the spine or in the visualize ribs.  IMPRESSION: 1. No definite acute findings in the abdomen to account for the patient's symptoms. 2. Normal appendix. 3. Trace bilateral pleural effusions. 4. Indeterminate splenic lesion, strongly favored to be benign, potentially a lymphangioma. 5. Multiple indeterminate sclerotic lesions in the pelvis, as detailed above. Although some of these have an appearance that may indicate bone islands, the majority do not. The possibility of sclerotic metastases warrants consideration, and further evaluation with nonemergent MRI of the pelvis with and without IV gadolinium in the near future is strongly recommended. These results were called by  telephone at the time of interpretation on 02/24/2014 at 4:20 pm to PALearywho verbally acknowledged these results.   Electronically Signed   By: DaVinnie Langton.D.   On: 02/24/2014 16:22     EKG Interpretation None      Results for orders placed during the hospital encounter of 02/24/14  WET PREP, GENITAL      Result Value Ref Range   Yeast Wet Prep HPF POC NONE SEEN  NONE SEEN   Trich, Wet Prep NONE SEEN  NONE SEEN   Clue Cells Wet Prep HPF POC FEW (*) NONE SEEN   WBC, Wet Prep HPF POC FEW (*) NONE SEEN  CBC WITH DIFFERENTIAL      Result Value Ref Range   WBC 6.3  4.0 - 10.5 K/uL   RBC 4.05  3.87 - 5.11 MIL/uL   Hemoglobin 11.0 (*) 12.0 - 15.0 g/dL   HCT 33.9 (*) 36.0 - 46.0 %   MCV 83.7  78.0 - 100.0 fL   MCH 27.2  26.0 - 34.0 pg   MCHC 32.4  30.0 - 36.0 g/dL   RDW  16.8 (*) 11.5 - 15.5 %   Platelets 236  150 - 400 K/uL   Neutrophils Relative % 52  43 - 77 %   Neutro Abs 3.3  1.7 - 7.7 K/uL   Lymphocytes Relative 38  12 - 46 %   Lymphs Abs 2.4  0.7 - 4.0 K/uL   Monocytes Relative 5  3 - 12 %   Monocytes Absolute 0.3  0.1 - 1.0 K/uL   Eosinophils Relative 4  0 - 5 %   Eosinophils Absolute 0.3  0.0 - 0.7 K/uL   Basophils Relative 1  0 - 1 %   Basophils Absolute 0.1  0.0 - 0.1 K/uL  COMPREHENSIVE METABOLIC PANEL      Result Value Ref Range   Sodium 139  137 - 147 mEq/L   Potassium 3.9  3.7 - 5.3 mEq/L   Chloride 104  96 - 112 mEq/L   CO2 21  19 - 32 mEq/L   Glucose, Bld 84  70 - 99 mg/dL   BUN 5 (*) 6 - 23 mg/dL   Creatinine, Ser 0.62  0.50 - 1.10 mg/dL   Calcium 9.1  8.4 - 10.5 mg/dL   Total Protein 7.1  6.0 - 8.3 g/dL   Albumin 3.8  3.5 - 5.2 g/dL   AST 11  0 - 37 U/L   ALT 6  0 - 35 U/L   Alkaline Phosphatase 54  39 - 117 U/L   Total Bilirubin 0.4  0.3 - 1.2 mg/dL   GFR calc non Af Amer >90  >90 mL/min   GFR calc Af Amer >90  >90 mL/min   Anion gap 14  5 - 15  URINALYSIS, ROUTINE W REFLEX MICROSCOPIC      Result Value Ref Range   Color, Urine  YELLOW  YELLOW   APPearance CLEAR  CLEAR   Specific Gravity, Urine 1.018  1.005 - 1.030   pH 7.5  5.0 - 8.0   Glucose, UA NEGATIVE  NEGATIVE mg/dL   Hgb urine dipstick NEGATIVE  NEGATIVE   Bilirubin Urine NEGATIVE  NEGATIVE   Ketones, ur NEGATIVE  NEGATIVE mg/dL   Protein, ur NEGATIVE  NEGATIVE mg/dL   Urobilinogen, UA 1.0  0.0 - 1.0 mg/dL   Nitrite NEGATIVE  NEGATIVE   Leukocytes, UA NEGATIVE  NEGATIVE  PREGNANCY, URINE      Result Value Ref Range   Preg Test, Ur NEGATIVE  NEGATIVE  RPR      Result Value Ref Range   RPR NON REAC  NON REAC  HIV ANTIBODY (ROUTINE TESTING)      Result Value Ref Range   HIV 1&2 Ab, 4th Generation NONREACTIVE  NONREACTIVE     MDM   Denise Moses is a 33 y.o. female with a PMH of anemia and pregnancy induced HTN who presents to the ED for evaluation of abdominal and back pain. Etiology of back and abdominal pain unclear. Abdominal exam benign. Patient had pelvic US which showed a small amount of non-specific fluid within the endometrial canal. Otherwise remarkable. Pelvic exam unremarkable. Patient subsequently had a CT which showed multiple sclerotic lesions within the pelvis. Spoke with radiologist who is concerned for metastasis and recommended OP MRI with contrast to further evaluate. Patient informed of results. Patient met with case manager to OP plan for follow-up discussed. Labs unremarkable.  Vital signs stable. Return precautions, discharge instructions, and follow-up was discussed with the patient before discharge.    Rechecks  2:00 PM =  Pain returning. Patient states that her pain initially improved after morphine however worsened after her pelvic ultrasound. Repeat abdominal pain reveals tenderness to palpation in the right lower quadrant. Ordering CT scan. 4:30 PM = Pain controlled. Able to tolerate PO challenge.     Discharge Medication List as of 02/24/2014  4:49 PM    START taking these medications   Details   HYDROcodone-acetaminophen (NORCO/VICODIN) 5-325 MG per tablet Take 1-2 tablets by mouth every 4 (four) hours as needed for moderate pain or severe pain., Starting 02/24/2014, Until Discontinued, Print         Final impressions: 1. Abdominal pain, unspecified abdominal location   2. Other biomechanical lesions of pelvic region      Mercy Moore PA-C   This patient was discussed with Dr. Elicia Lamp, PA-C 02/24/14 2147

## 2014-02-24 NOTE — ED Notes (Signed)
Jessica, PA at bedside

## 2014-02-24 NOTE — ED Notes (Signed)
Pt to ED c/o lower abd pain since Friday.  Reports cramping dull pains; states she thought her menstrual cycle was beginning, but has not. LMP 02/09/14

## 2014-02-24 NOTE — Discharge Instructions (Signed)
Your results of your CT abdomen and pelvis 2. Normal appendix. 3. Trace bilateral pleural effusions. 4. Indeterminate splenic lesion, strongly favored to be benign, potentially a lymphangioma. 5. Multiple indeterminate sclerotic lesions in the pelvis, as detailed above. Although some of these have an appearance that may indicate bone islands, the majority do not. The possibility of sclerotic metastases warrants consideration, and further evaluation with nonemergent MRI of the pelvis with and without IV gadolinium in the near future is strongly recommended.  - Please follow-up with your doctor regarding the results above  - Take Vicodin for severe pain - Please be careful with this medication.  It can cause drowsiness.  Use caution while driving, operating machinery, drinking alcohol, or any other activities that may impair your physical or mental abilities.   - Return to the emergency department if you develop any changing/worsening condition, worsening pain, repeated vomiting, fever, or any other concerns (please read additional information regarding your condition below)     Abdominal Pain Many things can cause abdominal pain. Usually, abdominal pain is not caused by a disease and will improve without treatment. It can often be observed and treated at home. Your health care provider will do a physical exam and possibly order blood tests and X-rays to help determine the seriousness of your pain. However, in many cases, more time must pass before a clear cause of the pain can be found. Before that point, your health care provider may not know if you need more testing or further treatment. HOME CARE INSTRUCTIONS  Monitor your abdominal pain for any changes. The following actions may help to alleviate any discomfort you are experiencing:  Only take over-the-counter or prescription medicines as directed by your health care provider.  Do not take laxatives unless directed to do so by your health  care provider.  Try a clear liquid diet (broth, tea, or water) as directed by your health care provider. Slowly move to a bland diet as tolerated. SEEK MEDICAL CARE IF:  You have unexplained abdominal pain.  You have abdominal pain associated with nausea or diarrhea.  You have pain when you urinate or have a bowel movement.  You experience abdominal pain that wakes you in the night.  You have abdominal pain that is worsened or improved by eating food.  You have abdominal pain that is worsened with eating fatty foods.  You have a fever. SEEK IMMEDIATE MEDICAL CARE IF:   Your pain does not go away within 2 hours.  You keep throwing up (vomiting).  Your pain is felt only in portions of the abdomen, such as the right side or the left lower portion of the abdomen.  You pass bloody or black tarry stools. MAKE SURE YOU:  Understand these instructions.   Will watch your condition.   Will get help right away if you are not doing well or get worse.  Document Released: 04/28/2005 Document Revised: 07/24/2013 Document Reviewed: 03/28/2013 [facility] Patient Information 2015 [place], [place]. This information is not intended to replace advice given to you by your health care provider. Make sure you discuss any questions you have with your health care provider.  Pelvic Pain Female pelvic pain can be caused by many different things and start from a variety of places. Pelvic pain refers to pain that is located in the lower half of the abdomen and between your hips. The pain may occur over a short period of time (acute) or may be reoccurring (chronic). The cause of pelvic pain may  be related to disorders affecting the female reproductive organs (gynecologic), but it may also be related to the bladder, kidney stones, an intestinal complication, or muscle or skeletal problems. Getting help right away for pelvic pain is important, especially if there has been severe, sharp, or a sudden onset of  unusual pain. It is also important to get help right away because some types of pelvic pain can be life threatening.  CAUSES  Below are only some of the causes of pelvic pain. The causes of pelvic pain can be in one of several categories.   Gynecologic.  Pelvic inflammatory disease.  Sexually transmitted infection.  Ovarian cyst or a twisted ovarian ligament (ovarian torsion).  Uterine lining that grows outside the uterus (endometriosis).  Fibroids, cysts, or tumors.  Ovulation.  Pregnancy.  Pregnancy that occurs outside the uterus (ectopic pregnancy).  Miscarriage.  Labor.  Abruption of the placenta or ruptured uterus.  Infection.  Uterine infection (endometritis).  Bladder infection.  Diverticulitis.  Miscarriage related to a uterine infection (septic abortion).  Bladder.  Inflammation of the bladder (cystitis).  Kidney stone(s).  Gastrointestinal.  Constipation.  Diverticulitis.  Neurologic.  Trauma.  Feeling pelvic pain because of mental or emotional causes (psychosomatic).  Cancers of the bowel or pelvis. EVALUATION  Your caregiver will want to take a careful history of your concerns. This includes recent changes in your health, a careful gynecologic history of your periods (menses), and a sexual history. Obtaining your family history and medical history is also important. Your caregiver may suggest a pelvic exam. A pelvic exam will help identify the location and severity of the pain. It also helps in the evaluation of which organ system may be involved. In order to identify the cause of the pelvic pain and be properly treated, your caregiver may order tests. These tests may include:   A pregnancy test.  Pelvic ultrasonography.  An X-ray exam of the abdomen.  A urinalysis or evaluation of vaginal discharge.  Blood tests. HOME CARE INSTRUCTIONS   Only take over-the-counter or prescription medicines for pain, discomfort, or fever as directed  by your caregiver.   Rest as directed by your caregiver.   Eat a balanced diet.   Drink enough fluids to make your urine clear or pale yellow, or as directed.   Avoid sexual intercourse if it causes pain.   Apply warm or cold compresses to the lower abdomen depending on which one helps the pain.   Avoid stressful situations.   Keep a journal of your pelvic pain. Write down when it started, where the pain is located, and if there are things that seem to be associated with the pain, such as food or your menstrual cycle.  Follow up with your caregiver as directed.  SEEK MEDICAL CARE IF:  Your medicine does not help your pain.  You have abnormal vaginal discharge. SEEK IMMEDIATE MEDICAL CARE IF:   You have heavy bleeding from the vagina.   Your pelvic pain increases.   You feel light-headed or faint.   You have chills.   You have pain with urination or blood in your urine.   You have uncontrolled diarrhea or vomiting.   You have a fever or persistent symptoms for more than 3 days.  You have a fever and your symptoms suddenly get worse.   You are being physically or sexually abused.  MAKE SURE YOU:  Understand these instructions.  Will watch your condition.  Will get help if you are not doing well  or get worse. Document Released: 06/15/2004 Document Revised: 12/03/2013 Document Reviewed: 11/08/2011 [facility] Patient Information 2015 [place], [place]. This information is not intended to replace advice given to you by your health care provider. Make sure you discuss any questions you have with your health care provider.

## 2014-02-24 NOTE — Progress Notes (Signed)
ED CM consulted by Dr. Winfred Leeds concerning establishing care for f/u with suspicious Pelvis CT findings. Pt presented to [facility] ED with abdominal and back pains. Pt recently moved to the area from [place] and does not have a PCP or insurance presently.Patient is now a [facility] resident. Discussed the [facility] and services provided, patient agreeable with establishing care. Provided patient with [facility] brochure with address and phone number. Patient was instructed to walk in tomorrow morning to establish care.for f/u. Patient verbalized understanding teach back done,.and patient voiced appreciation for the assistance.  ED CM will contact clinic Practice Manager regarding patient f/u. Discussed plan with Dr. Winfred Leeds, he is agreeable. ED CM will continue to f/u with patient.

## 2014-02-24 NOTE — ED Notes (Addendum)
Pt c/o lower abdominal quadrants. Tender on palpation. Reports feels like menstrual cycle, but is not bleeding.  Pain described as cramping dull pain x 2 days. Denies GU/GI issues. Hx of abortion x 2 months ago, denies complications, states "My cramps are just different since the abortion." Has noticed small amount of yellow discharge, denies odor

## 2014-02-25 ENCOUNTER — Encounter: Payer: Self-pay | Admitting: Internal Medicine

## 2014-02-25 ENCOUNTER — Ambulatory Visit: Payer: Medicaid Other | Attending: Internal Medicine | Admitting: Internal Medicine

## 2014-02-25 VITALS — BP 134/85 | HR 63 | Temp 98.8°F | Resp 16 | Ht 66.0 in | Wt 210.0 lb

## 2014-02-25 DIAGNOSIS — R109 Unspecified abdominal pain: Secondary | ICD-10-CM

## 2014-02-25 DIAGNOSIS — R948 Abnormal results of function studies of other organs and systems: Secondary | ICD-10-CM | POA: Diagnosis not present

## 2014-02-25 DIAGNOSIS — M549 Dorsalgia, unspecified: Secondary | ICD-10-CM | POA: Diagnosis not present

## 2014-02-25 DIAGNOSIS — F17200 Nicotine dependence, unspecified, uncomplicated: Secondary | ICD-10-CM | POA: Diagnosis not present

## 2014-02-25 DIAGNOSIS — R935 Abnormal findings on diagnostic imaging of other abdominal regions, including retroperitoneum: Secondary | ICD-10-CM

## 2014-02-25 LAB — GC/CHLAMYDIA PROBE AMP
CT Probe RNA: NEGATIVE
GC PROBE AMP APTIMA: NEGATIVE

## 2014-02-25 NOTE — Progress Notes (Signed)
Patient ID: Denise Moses, female   DOB: 1980-10-25, 33 y.o.   MRN: [id number]   Denise Moses, is a 33 y.o. female  YQM:[id number]  BMW:[id number]  DOB - 1981/02/26  CC:  Chief Complaint  Patient presents with  . Establish Care  . Hospitalization Follow-up       HPI: Denise Moses is a 33 y.o. female here today to establish medical care. She has PMH of anemia and pregnancy induced HTN, was recently seen in the ED for evaluation of abdominal and back pain. Patient has a 2 day hx of lower abdominal pain, which radiates to her lower back bilaterally. Previous abdominal surgeries include a cholecystectomy. She has mild white vaginal discharge with no odor. LNMP 02/09/14. Patient had an induced abortion two months ago. She denies any vaginal bleeding/spotting, fever, chills, nausea, emesis, diarrhea, constipation, dysuria, hematuria. 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 The possibility of sclerotic metastases warrants consideration, and further evaluation with non-emergent MRI of the pelvis with and without IV gadolinium in the near future is strongly recommended. Patient is here today to be scheduled for the MRI and also to have medication for pain. She has no new complaints. She smoke cigarette about quarter of a pack per day, she drinks alcohol. She has 5 children, presently lives with her husband Patient has No headache, No chest pain, No Nausea, No new weakness tingling or numbness, No Cough - SOB.  No Known Allergies Past Medical History  Diagnosis Date  . Anemia   . Pregnancy induced hypertension     2001   Current Outpatient Prescriptions on File Prior to Visit  Medication Sig Dispense Refill  . HYDROcodone-acetaminophen (NORCO/VICODIN) 5-325 MG per tablet Take 1-2 tablets by mouth every 4 (four) hours as needed for moderate pain or severe pain.  6 tablet  0   No current facility-administered  medications on file prior to visit.   Family History  Problem Relation Age of Onset  . Cancer Mother   . Cancer Maternal Aunt   . Cancer Maternal Grandfather    History   Social History  . Marital Status: Single    Spouse Name: N/A    Number of Children: N/A  . Years of Education: N/A   Occupational History  . Not on file.   Social History Main Topics  . Smoking status: Current Every Day Smoker -- 0.25 packs/day  . Smokeless tobacco: Not on file  . Alcohol Use: Yes  . Drug Use: No  . Sexual Activity: Yes   Other Topics Concern  . Not on file   Social History Narrative  . No narrative on file    Review of Systems: Constitutional: Negative for fever, chills, diaphoresis, activity change, appetite change and fatigue. HENT: Negative for ear pain, nosebleeds, congestion, facial swelling, rhinorrhea, neck pain, neck stiffness and ear discharge.  Eyes: Negative for pain, discharge, redness, itching and visual disturbance. Respiratory: Negative for cough, choking, chest tightness, shortness of breath, wheezing and stridor.  Cardiovascular: Negative for chest pain, palpitations and leg swelling. Gastrointestinal: Negative for abdominal distention. Genitourinary: Negative for dysuria, urgency, frequency, hematuria, flank pain, decreased urine volume, difficulty urinating and dyspareunia.  Musculoskeletal: Negative for back pain, joint swelling, arthralgia and gait problem. Neurological: Negative for dizziness, tremors, seizures, syncope, facial asymmetry, speech difficulty, weakness, light-headedness, numbness and headaches.  Hematological: Negative for adenopathy. Does not bruise/bleed easily. Psychiatric/Behavioral: Negative for hallucinations, behavioral problems, confusion,  dysphoric mood, decreased concentration and agitation.    Objective:   Filed Vitals:   02/25/14 0938  BP: 134/85  Pulse: 63  Temp: 98.8 F (37.1 C)  Resp: 16    Physical Exam: Constitutional:  Patient appears well-developed and well-nourished. No distress. HENT: Normocephalic, atraumatic, External right and left ear normal. Oropharynx is clear and moist.  Eyes: Conjunctivae and EOM are normal. PERRLA, no scleral icterus. Neck: Normal ROM. Neck supple. No JVD. No tracheal deviation. No thyromegaly. CVS: RRR, S1/S2 +, no murmurs, no gallops, no carotid bruit.  Pulmonary: Effort and breath sounds normal, no stridor, rhonchi, wheezes, rales.  Abdominal: Soft. BS +, no distension, ++ tenderness, no rebound or guarding.  Musculoskeletal: Normal range of motion. No edema and no tenderness.  Lymphadenopathy: No lymphadenopathy noted, cervical, inguinal or axillary Neuro: Alert. Normal reflexes, muscle tone coordination. No cranial nerve deficit. Skin: Skin is warm and dry. No rash noted. Not diaphoretic. No erythema. No pallor. Psychiatric: Normal mood and affect. Behavior, judgment, thought content normal.  Lab Results  Component Value Date   WBC 6.3 02/24/2014   HGB 11.0* 02/24/2014   HCT 33.9* 02/24/2014   MCV 83.7 02/24/2014   PLT 236 02/24/2014   Lab Results  Component Value Date   CREATININE 0.62 02/24/2014   BUN 5* 02/24/2014   NA 139 02/24/2014   K 3.9 02/24/2014   CL 104 02/24/2014   CO2 21 02/24/2014    No results found for this basename: HGBA1C   Lipid Panel  No results found for this basename: chol, trig, hdl, cholhdl, vldl, ldlcalc       Assessment and plan:   1. Abdominal pain in female  - MR Abdomen W Contrast; Future - MR Pelvis W Contrast; Future  2. Abnormal CT scan, pelvis  - MR Abdomen W Contrast; Future - MR Pelvis W Contrast; Future  Patient was extensively counseled about smoking cessation  Return in about 6 months (around 08/28/2014), or if symptoms worsen or fail to improve, for Abdominal Pain.  The patient was given clear instructions to go to ER or return to medical center if symptoms don't improve, worsen or new problems develop. The patient  verbalized understanding. The patient was told to call to get lab results if they haven't heard anything in the next week.     This note has been created with Surveyor, quantity. Any transcriptional errors are unintentional.    Angelica Chessman, MD, [place], [place], [place], [place]   02/25/2014, 10:45 AM

## 2014-02-25 NOTE — Progress Notes (Signed)
HF Pt is here following up on her acute abdomen pain.  At the ED the found lesions in the pelvic region. Pt is on pain medications controlling her pain.

## 2014-02-25 NOTE — Patient Instructions (Signed)

## 2014-02-26 ENCOUNTER — Telehealth: Payer: Self-pay | Admitting: Internal Medicine

## 2014-02-26 NOTE — Telephone Encounter (Signed)
Pt. Needs more pain medication, [place] at [facility] Please, contact Pt.

## 2014-02-28 ENCOUNTER — Telehealth: Payer: Self-pay

## 2014-02-28 NOTE — ED Provider Notes (Signed)
Medical screening examination/treatment/procedure(s) were performed by non-physician practitioner and as supervising physician I was immediately available for consultation/collaboration.   EKG Interpretation None       Orlie Dakin, MD 02/28/14 1712

## 2014-02-28 NOTE — Telephone Encounter (Signed)
Returned patient call Patient not available Left message with family member to return our call

## 2014-03-19 ENCOUNTER — Ambulatory Visit (HOSPITAL_COMMUNITY): Admission: RE | Admit: 2014-03-19 | Payer: Medicaid Other | Source: Ambulatory Visit

## 2014-03-19 ENCOUNTER — Ambulatory Visit (HOSPITAL_COMMUNITY)
Admission: RE | Admit: 2014-03-19 | Discharge: 2014-03-19 | Disposition: A | Discharge status: Home / Self Care | Payer: Medicaid Other | Source: Ambulatory Visit | Attending: Internal Medicine | Admitting: Internal Medicine

## 2014-03-19 DIAGNOSIS — D739 Disease of spleen, unspecified: Secondary | ICD-10-CM | POA: Diagnosis not present

## 2014-03-19 DIAGNOSIS — R109 Unspecified abdominal pain: Secondary | ICD-10-CM

## 2014-03-19 DIAGNOSIS — R935 Abnormal findings on diagnostic imaging of other abdominal regions, including retroperitoneum: Secondary | ICD-10-CM | POA: Diagnosis present

## 2014-03-19 DIAGNOSIS — K7689 Other specified diseases of liver: Secondary | ICD-10-CM | POA: Insufficient documentation

## 2014-03-19 DIAGNOSIS — M899 Disorder of bone, unspecified: Secondary | ICD-10-CM | POA: Insufficient documentation

## 2014-03-19 DIAGNOSIS — M949 Disorder of cartilage, unspecified: Principal | ICD-10-CM

## 2014-03-19 MED ORDER — GADOBENATE DIMEGLUMINE 529 MG/ML IV SOLN
20.0000 mL | Freq: Once | INTRAVENOUS | Status: AC
  Administered 2014-03-19: 20 mL via INTRAVENOUS

## 2014-03-21 ENCOUNTER — Telehealth: Payer: Self-pay | Admitting: *Deleted

## 2014-03-21 NOTE — Telephone Encounter (Signed)
Pt. Calling about MRI results. Please f/u with pt.

## 2014-03-21 NOTE — Telephone Encounter (Signed)
Returned patient's call. No answer. Left a voicemail for patient to return the call.

## 2014-03-22 ENCOUNTER — Telehealth: Payer: Self-pay | Admitting: Internal Medicine

## 2014-03-22 NOTE — Telephone Encounter (Signed)
Pt calling for MRI results. Please f/u with pt.

## 2014-03-25 NOTE — Telephone Encounter (Signed)
Pt. Calling for MRI results. Please f/u with pt.

## 2014-03-26 ENCOUNTER — Telehealth: Payer: Self-pay | Admitting: Internal Medicine

## 2014-03-26 ENCOUNTER — Telehealth: Payer: Self-pay | Admitting: Emergency Medicine

## 2014-03-26 ENCOUNTER — Telehealth: Payer: Self-pay | Admitting: *Deleted

## 2014-03-26 NOTE — Telephone Encounter (Signed)
Pt called requesting MRI results, results not read. Will forward message to Dr Doreene Burke

## 2014-03-26 NOTE — Telephone Encounter (Signed)
Patient needs to know MRI results Please f/u with Patient

## 2014-03-26 NOTE — Telephone Encounter (Signed)
I had resulted the MRI. It did not show significant findings needing immediate actions. We will repeat the test in 6 months to make sure findings are stable.   Please give her a note to return to work. Thanks [employer]

## 2014-03-26 NOTE — Telephone Encounter (Signed)
Pt given MRI results. Informed we will repeat in 6 months

## 2014-03-26 NOTE — Telephone Encounter (Signed)
Pt comes in clinic requesting MRI results. Pt was informed report needs to be read by provider. We will call her when complete. Pt is now requesting return to work note.

## 2014-03-26 NOTE — Telephone Encounter (Signed)
Pt. Calling for MRI results Please f/u with pt.

## 2014-04-03 ENCOUNTER — Encounter: Payer: Medicaid Other | Admitting: Internal Medicine

## 2014-04-15 ENCOUNTER — Encounter (HOSPITAL_COMMUNITY): Payer: Self-pay | Admitting: Emergency Medicine

## 2014-04-15 ENCOUNTER — Emergency Department (HOSPITAL_COMMUNITY)
Admission: EM | Admit: 2014-04-15 | Discharge: 2014-04-15 | Disposition: A | Discharge status: Home / Self Care | Payer: Medicaid Other | Attending: Emergency Medicine | Admitting: Emergency Medicine

## 2014-04-15 DIAGNOSIS — H109 Unspecified conjunctivitis: Secondary | ICD-10-CM | POA: Insufficient documentation

## 2014-04-15 DIAGNOSIS — H5789 Other specified disorders of eye and adnexa: Secondary | ICD-10-CM | POA: Insufficient documentation

## 2014-04-15 DIAGNOSIS — F17200 Nicotine dependence, unspecified, uncomplicated: Secondary | ICD-10-CM | POA: Diagnosis not present

## 2014-04-15 DIAGNOSIS — Z862 Personal history of diseases of the blood and blood-forming organs and certain disorders involving the immune mechanism: Secondary | ICD-10-CM | POA: Insufficient documentation

## 2014-04-15 MED ORDER — TOBRAMYCIN 0.3 % OP SOLN: 2.0000 [drp] | OPHTHALMIC | Status: DC

## 2014-04-15 NOTE — ED Provider Notes (Signed)
Medical screening examination/treatment/procedure(s) were performed by non-physician practitioner and as supervising physician I was immediately available for consultation/collaboration.  Carmin Muskrat, MD 04/15/14 [phone]

## 2014-04-15 NOTE — ED Notes (Signed)
Patient states her L eye started swelling and was red on Friday.  Patient states a lot of discharge also.

## 2014-04-15 NOTE — ED Provider Notes (Signed)
CSN: [id number]     Arrival date & time 04/15/14  2440 History  This chart was scribed for non-physician practitioner, Michele Mcalpine, PA-C,working with Carmin Muskrat, MD, by Marlowe Kays, ED Scribe. This patient was seen in room TR04C/TR04C and the patient's care was started at 10:30 AM.  Chief Complaint  Patient presents with  . eye symptoms    The history is provided by the patient. No language interpreter was used.   HPI Comments:  Denise Moses is a 33 y.o. female who presents to the Emergency Department complaining of moderate left eye swelling, pain and redness that started three days ago. She reports associated itching and clear/green discharge from the eye as well. Upon waking this morning she endorses some crusting of the eye. Pt denies any known contact with anyone with similar symptoms. She denies visual disturbance, fever, chills, HA or facial swelling.  Past Medical History  Diagnosis Date  . Anemia   . Pregnancy induced hypertension     2001   Past Surgical History  Procedure Laterality Date  . Cholecystectomy    . No past surgeries     Family History  Problem Relation Age of Onset  . Cancer Mother   . Cancer Maternal Aunt   . Cancer Maternal Grandfather    History  Substance Use Topics  . Smoking status: Current Every Day Smoker -- 0.25 packs/day    Types: Cigarettes  . Smokeless tobacco: Not on file  . Alcohol Use: Yes   OB History   Grav Para Term Preterm Abortions TAB SAB Ect Mult Living   5 4 3 1      4      Review of Systems  Constitutional: Negative for fever and chills.  HENT: Negative for facial swelling.   Eyes: Positive for pain, discharge and redness. Negative for visual disturbance.  Neurological: Negative for headaches.  All other systems reviewed and are negative.   Allergies  Review of patient's allergies indicates no known allergies.  Home Medications   Prior to Admission medications   Medication Sig Start Date End Date  Taking? Authorizing Provider  HYDROcodone-acetaminophen (NORCO/VICODIN) 5-325 MG per tablet Take 1-2 tablets by mouth every 4 (four) hours as needed for moderate pain or severe pain. 02/24/14   Lucila [place], PA-C  tobramycin (TOBREX) 0.3 % ophthalmic solution Place 2 drops into the left eye every 4 (four) hours. 04/15/14   Illene Labrador, PA-C   Triage Vitals: BP 149/70  Pulse 85  Temp(Src) 98 F (36.7 C) (Oral)  Resp 18  Ht 5\' 6"  (1.676 m)  Wt 210 lb (95.255 kg)  BMI 33.91 kg/m2  SpO2 100%  LMP 04/14/2014 Physical Exam  Nursing note and vitals reviewed. Constitutional: She is oriented to person, place, and time. She appears well-developed and well-nourished. No distress.  HENT:  Head: Normocephalic and atraumatic.  Mouth/Throat: Oropharynx is clear and moist.  Eyes: EOM and lids are normal. Left conjunctiva is injected.  Purulent drainage at left nasal canthus.  Neck: Normal range of motion. Neck supple.  Cardiovascular: Normal rate, regular rhythm and normal heart sounds.   Pulmonary/Chest: Effort normal and breath sounds normal. No respiratory distress.  Musculoskeletal: Normal range of motion. She exhibits no edema.  Neurological: She is alert and oriented to person, place, and time. No sensory deficit.  Skin: Skin is warm and dry.  Psychiatric: She has a normal mood and affect. Her behavior is normal.    ED Course  Procedures (including critical  care time) DIAGNOSTIC STUDIES: Oxygen Saturation is 100% on RA, normal by my interpretation.   COORDINATION OF CARE: 10:34 AM- Will treat for conjunctivitis and provide work note. Pt verbalizes understanding and agrees to plan.  Medications - No data to display  Labs Review Labs Reviewed - No data to display  Imaging Review No results found.   EKG Interpretation None      MDM   Final diagnoses:  Conjunctivitis, left eye   Patient well-appearing in no apparent distress. Treat with TobraDex eyedrops, advised warm  compresses. Stable for discharge. Return precautions given. Patient states understanding of treatment care plan and is agreeable.  I personally performed the services described in this documentation, which was scribed in my presence. The recorded information has been reviewed and is accurate.    Illene Labrador, PA-C 04/15/14 1038

## 2014-04-15 NOTE — Discharge Instructions (Signed)
Apply antibiotic eyedrops as directed for 5 days. Apply warm compresses.  Conjunctivitis Conjunctivitis is commonly called "pink eye." Conjunctivitis can be caused by bacterial or viral infection, allergies, or injuries. There is usually redness of the lining of the eye, itching, discomfort, and sometimes discharge. There may be deposits of matter along the eyelids. A viral infection usually causes a watery discharge, while a bacterial infection causes a yellowish, thick discharge. Pink eye is very contagious and spreads by direct contact. You may be given antibiotic eyedrops as part of your treatment. Before using your eye medicine, remove all drainage from the eye by washing gently with warm water and cotton balls. Continue to use the medication until you have awakened 2 mornings in a row without discharge from the eye. Do not rub your eye. This increases the irritation and helps spread infection. Use separate towels from other household members. Wash your hands with soap and water before and after touching your eyes. Use cold compresses to reduce pain and sunglasses to relieve irritation from light. Do not wear contact lenses or wear eye makeup until the infection is gone. SEEK MEDICAL CARE IF:   Your symptoms are not better after 3 days of treatment.  You have increased pain or trouble seeing.  The outer eyelids become very red or swollen. Document Released: 08/26/2004 Document Revised: 10/11/2011 Document Reviewed: 07/19/2005 [facility] Patient Information 2015 [place], [place]. This information is not intended to replace advice given to you by your health care provider. Make sure you discuss any questions you have with your health care provider.

## 2014-04-21 ENCOUNTER — Emergency Department (HOSPITAL_COMMUNITY): Payer: Medicaid Other

## 2014-04-21 ENCOUNTER — Emergency Department (HOSPITAL_COMMUNITY)
Admission: EM | Admit: 2014-04-21 | Discharge: 2014-04-21 | Disposition: A | Discharge status: Home / Self Care | Payer: Medicaid Other | Attending: Emergency Medicine | Admitting: Emergency Medicine

## 2014-04-21 ENCOUNTER — Encounter (HOSPITAL_COMMUNITY): Payer: Self-pay | Admitting: Emergency Medicine

## 2014-04-21 DIAGNOSIS — H00039 Abscess of eyelid unspecified eye, unspecified eyelid: Secondary | ICD-10-CM | POA: Insufficient documentation

## 2014-04-21 DIAGNOSIS — F17200 Nicotine dependence, unspecified, uncomplicated: Secondary | ICD-10-CM | POA: Diagnosis not present

## 2014-04-21 DIAGNOSIS — H5710 Ocular pain, unspecified eye: Secondary | ICD-10-CM | POA: Diagnosis present

## 2014-04-21 DIAGNOSIS — Z3202 Encounter for pregnancy test, result negative: Secondary | ICD-10-CM | POA: Diagnosis not present

## 2014-04-21 DIAGNOSIS — H00036 Abscess of eyelid left eye, unspecified eyelid: Secondary | ICD-10-CM

## 2014-04-21 DIAGNOSIS — Z862 Personal history of diseases of the blood and blood-forming organs and certain disorders involving the immune mechanism: Secondary | ICD-10-CM | POA: Insufficient documentation

## 2014-04-21 DIAGNOSIS — H109 Unspecified conjunctivitis: Secondary | ICD-10-CM | POA: Diagnosis not present

## 2014-04-21 LAB — I-STAT CHEM 8, ED
BUN: 8 mg/dL (ref 6–23)
Calcium, Ion: 1.26 mmol/L — ABNORMAL HIGH (ref 1.12–1.23)
Chloride: 105 mEq/L (ref 96–112)
Creatinine, Ser: 0.6 mg/dL (ref 0.50–1.10)
Glucose, Bld: 82 mg/dL (ref 70–99)
HEMATOCRIT: 41 % (ref 36.0–46.0)
HEMOGLOBIN: 13.9 g/dL (ref 12.0–15.0)
Potassium: 3.9 mEq/L (ref 3.7–5.3)
SODIUM: 138 meq/L (ref 137–147)
TCO2: 22 mmol/L (ref 0–100)

## 2014-04-21 LAB — POC URINE PREG, ED: PREG TEST UR: NEGATIVE

## 2014-04-21 MED ORDER — FLUORESCEIN SODIUM 1 MG OP STRP
1.0000 | ORAL_STRIP | Freq: Once | OPHTHALMIC | Status: AC
  Administered 2014-04-21: 1 via OPHTHALMIC
  Filled 2014-04-21: qty 1

## 2014-04-21 MED ORDER — CLINDAMYCIN HCL 150 MG PO CAPS: 450.0000 mg | ORAL_CAPSULE | Freq: Three times a day (TID) | ORAL | Status: DC

## 2014-04-21 MED ORDER — IOHEXOL 300 MG/ML  SOLN
100.0000 mL | Freq: Once | INTRAMUSCULAR | Status: AC | PRN
  Administered 2014-04-21: 100 mL via INTRAVENOUS

## 2014-04-21 MED ORDER — TETRACAINE HCL 0.5 % OP SOLN
2.0000 [drp] | Freq: Once | OPHTHALMIC | Status: AC
  Administered 2014-04-21: 2 [drp] via OPHTHALMIC
  Filled 2014-04-21: qty 2

## 2014-04-21 NOTE — Discharge Instructions (Signed)
Please call Dr. Mellissa Kohut office tomorrow morning at 8:30 AM, he is expecting to see you in his office tomorrow. Please rest and stay hydrated Please massage the lacrimal sac, left aspect of the nose to aid in discomfort and swelling reduction Please avoid using contacts or touching the eye Please take antibiotics as prescribed and on a full stomach Please continue to monitor symptoms closely and if symptoms are to worsen or change (fever greater than 101, chills, sweating, nausea, vomiting, chest pain, shortness of breathe, difficulty breathing, weakness, numbness, tingling, worsening or changes to pain pattern, eye swelling, redness, drainage, bleeding, loss of vision) please report back to the Emergency Department immediately.   Periorbital Cellulitis Periorbital cellulitis is a common infection that can affect the eyelid and the soft tissues that surround the eyeball. The infection may also affect the structures that produce and drain tears. It does not affect the eyeball itself. Natural tissue barriers usually prevent the spread of this infection to the eyeball and other deeper areas of the eye socket.  CAUSES  Bacterial infection.  Long-term (chronic) sinus infections.  An object (foreign body) stuck behind the eye.  An injury that goes through the eyelid tissues.  An injury that causes an infection, such as an insect sting.  Fracture of the bone around the eye.  Infections which have spread from the eyelid or other structures around the eye.  Bite wounds.  Inflammation or infection of the lining membranes of the brain (meningitis).  An infection in the blood (septicemia).  Dental infection (abscess).  Viral infection (this is rare). SYMPTOMS Symptoms usually come on suddenly.  Pain in the eye.  Red, hot, and swollen eyelids and possibly cheeks. The swelling is sometimes bad enough that the eyelids cannot open. Some infections make the eyelids look purple.  Fever and  feeling generally ill.  Pain when touching the area around the eye. DIAGNOSIS  Periorbital cellulitis can be diagnosed from an eye exam. In severe cases, your caregiver might suggest:  Blood tests.  Imaging tests (such as a CT scan) to examine the sinuses and the area around and behind the eyeball. TREATMENT If your caregiver feels that you do not have any signs of serious infection, treatment may include:  Antibiotics.  Nasal decongestants to reduce swelling.  Referral to a dentist if it is suspected that the infection was caused by a prior tooth infection.  Examination every day to make sure the problem is improving. HOME CARE INSTRUCTIONS  Take your antibiotics as directed. Finish them even if you start to feel better.  Some pain is normal with this condition. Take pain medicine as directed by your caregiver. Only take pain medicines approved by your caregiver.  It is important to drink fluids. Drink enough water and fluids to keep your urine clear or pale yellow.  Do not smoke.  Rest and get plenty of sleep.  Mild or moderate fevers generally have no long-term effects and often do not require treatment.  If your caregiver has given you a follow-up appointment, it is very important to keep that appointment. Your caregiver will need to make sure that the infection is getting better. It is important to check that a more serious infection is not developing. SEEK IMMEDIATE MEDICAL CARE IF:  Your eyelids become more painful, red, warm, or swollen.  You develop double vision or your vision becomes blurred or worsens in any way.  You have trouble moving your eyes.  The eye looks like it is popping  out (proptosis).  You develop a severe headache, severe neck pain, or neck stiffness.  You develop repeated vomiting.  You have a fever or persistent symptoms for more than 72 hours.  You have a fever and your symptoms suddenly get worse. MAKE SURE YOU:  Understand these  instructions.  Will watch your condition.  Will get help right away if you are not doing well or get worse. Document Released: 08/21/2010 Document Revised: 10/11/2011 Document Reviewed: 08/21/2010 [facility] Patient Information 2015 [place], [place]. This information is not intended to replace advice given to you by your health care provider. Make sure you discuss any questions you have with your health care provider.   Emergency Department Resource Guide 1) Find a Doctor and Pay Out of Pocket Although you won't have to find out who is covered by your insurance plan, it is a good idea to ask around and get recommendations. You will then need to call the office and see if the doctor you have chosen will accept you as a new patient and what types of options they offer for patients who are self-pay. Some doctors offer discounts or will set up payment plans for their patients who do not have insurance, but you will need to ask so you aren't surprised when you get to your appointment.  2) Contact Your Local Health Department Not all health departments have doctors that can see patients for sick visits, but many do, so it is worth a call to see if yours does. If you don't know where your local health department is, you can check in your phone book. The CDC also has a tool to help you locate your state's health department, and many state websites also have listings of all of their local health departments.  3) Find a [facility] If your illness is not likely to be very severe or complicated, you may want to try a walk in clinic. These are popping up all over the country in pharmacies, drugstores, and shopping centers. They're usually staffed by nurse practitioners or physician assistants that have been trained to treat common illnesses and complaints. They're usually fairly quick and inexpensive. However, if you have serious medical issues or chronic medical problems, these are probably not your best  option.  No Primary Care Doctor: - Call Health Connect at  [phone] - they can help you locate a primary care doctor that  accepts your insurance, provides certain services, etc. - Physician Referral Service- [phone]  Chronic Pain Problems: Organization         Address  Phone   Notes  [facility]  [phone] Patients need to be referred by their primary care doctor.   Medication Assistance: Organization         Address  Phone   Notes  [facility] Medication [facility] [address] [place], [place] [address] [phone] --Must be a resident of [facility] -- Must have NO insurance coverage whatsoever (no Medicaid/ Medicare, etc.) -- The pt. MUST have a primary care doctor that directs their care regularly and follows them in the community   MedAssist  [phone]   [employer]  [phone]    Agencies that provide inexpensive medical care: Organization         Address  Phone   Notes  [place]  [phone]   [name]    [phone]   [facility] [place]  [place], [place] [address] [phone]   [place] [address], [place] [phone]   Planned Parenthood    [phone]   [facility]    [phone]   [place] and [address], [place] Phone:  [phone], Fax:  [phone] Hours of Operation:  9 am - 6 pm, M-F.  Also accepts Medicaid/Medicare and self-pay.  [facility] for [place], [address], [place] Phone: [phone], Fax: [phone]. Hours of Operation:  8:30 am - 5:30 pm, M-F.  Also accepts Medicaid and self-pay.  [facility] [address] [place] Phone: [phone]   [facility] [place], [place], [place] [phone], Ext. [phone] Mondays & Thursdays: 7-9 AM.  First 15  patients are seen on a first come, first serve basis.    [place] Providers:  Organization         Address  Phone   Notes  [facility] [address], [place] [phone] Also accepts self-pay patients.  [facility] [address], [place]  [phone]   [place], [address], [place] [phone]   [facility] [address], [place] [phone]   Lucianne Lei [address], [place]   [phone] Only accepts [place] Access [place] patients after they have their name applied to their card.   Self-Pay (no insurance) in [facility]:  Organization         Address  Phone   Notes  Sickle Cell Patients, [facility] [place] [phone]   [facility]gent Care [place] [phone]   [name] [place]  [place], [place], [place] [phone]   [facility]/Dr. Osei-Bonsu  [address] [place] or [address], [place] [phone] Phone number for both [place] and [place] locations is the same.  [facility] [address], [place] [phone]   [facility] [address] [place] or [address] [phone]   [facility] [address], [place] [phone], phone; [phone], fax Sees patients 1st and 3rd Saturday of every month.  Must not qualify for public or private insurance (i.e. Medicaid, Medicare, [place] Health Choice, Veterans' Benefits)  Household income should be no more than 200% of the poverty level The clinic cannot treat you if you are pregnant or think you are pregnant  Sexually transmitted diseases are not treated at the clinic.    Dental  Care: Organization         Address  Phone  Notes  [facility] Department of [facility] [place] [phone] Accepts children up to age 43 who are enrolled in [place] or [place]; pregnant women with a Medicaid card; and children who have applied for Medicaid or [place] Health Choice, but were declined, whose parents can pay a reduced fee at time of service.  [facility] Department of [facility]  [address], [place] [phone] Accepts children up to age 69 who are enrolled in [place] or [place]  Health Choice; pregnant women with a Medicaid card; and children who have applied for Medicaid or [place] Health Choice, but were declined, whose parents can pay a reduced fee at time of service.  [place] Adult [facility] Access PROGRAM  [place] [phone] Patients are seen by appointment only. Walk-ins are not accepted. [place] will see patients 76 years of age and older. Monday - Tuesday (8am-5pm) Most Wednesdays (8:30-5pm) $30 per visit, cash only  [facility] Adult Dental Access PROGRAM  [address], [facility] [phone] Patients are seen by appointment only. Walk-ins are not accepted. [place] will see patients 4 years of age and older. One Wednesday Evening (Monthly: Volunteer Based).  $30 per visit, cash only  [place]  [phone] for adults; Children under age 76, call Graduate Pediatric Dentistry at [phone]. Children aged 22-14, please call [phone] to request a pediatric application.  Dental services are provided in all areas of dental care including fillings, crowns and bridges, complete and partial dentures, implants, gum treatment, root canals, and extractions. Preventive care is also provided. Treatment is provided to both adults and children. Patients are selected via a lottery and there is often a waiting list.   [facility] [address], [place]  [phone] [email or web address]   [facility] [address] [place], [place] [phone], Ext. [phone] Second and Fourth Thursday of each month, opens at 6:30 AM; Clinic ends at 9 AM.  Patients are seen on a first-come first-served basis, and a limited number are seen during each clinic.   [facility]  [address] Hillard Danker [place], [place] [phone]   Eligibility Requirements You must have lived in [place], [place], or [place] counties for at least the last three months.   You cannot be eligible for state or federal sponsored [employer], including [employer], [place], or [employer].   You generally cannot be eligible for healthcare insurance through your employer.    How to apply: Eligibility screenings are held every Tuesday and Wednesday afternoon from 1:00 pm until 4:00 pm. You do not need an appointment for the interview!  [facility] [address], [place], [place]   [place]  [place] Department  [place]  [phone]    Behavioral Health Resources in the Community: Intensive Outpatient Programs Organization         Address  Phone  Notes  [place]. [address], [place], [place] [phone]   [facility] [address], [place], [place]   ADS: [facility] [address] [place], [place]   [place] [address],  [place], [place] or [phone]   Substance Abuse Resources Organization         Address  Phone  Notes  Alcohol and Drug Services  [phone]   [place]  [phone]   [facility] [place]   Chinita Pester  [phone]   Residential & Outpatient Substance Abuse Program  [phone]    Psychological Services Organization         Address  Phone  Notes  [facility] [place]  [place]  [phone]   [place] [address] [place] or [phone]  Mobile Crisis Teams Organization         Address  Phone  Notes  [facility], Mobile Crisis Care Unit  [phone]   [facility] Psychotherapeutic Services  [address] [place], [place]   [place] Levels [address] [place] [phone]    Self-Help/Support Groups Organization         Address  Phone             Notes  [place]. of [place] - variety of support groups  [place] Call for more information  Narcotics Anonymous (NA), [facility] [address], [employer] [place]  2 meetings at this location   Special educational needs teacher         Address  Phone  Notes  [facility] [place],    [place]  [phone]   [facility]  [address] [place] [phone], [place], [place]   [place], [place] [phone] Admissions: 8am-3pm M-F  Incentives Substance [place] [address]    [place], [place] [phone]   [facility] [address] [place], [place], [place]   [facility] [address]  [place], [place]   [facility] Programs - Intensive Outpatient [address] Kristeen [address], [place], [place]   [facility] ([place].,  [place], [place] [phone] or [phone]   [facility] Services (RTS) [address] [place], [place] Accepts Medicaid  [facility] [place] [address]  [place] [phone] Substance Abuse/Addiction Treatment   [facility] Organization         Address  Phone  Notes  [facility]  [phone]   Domenic Schwab, PhD [address] Arlis Porta [place], [place]   [phone] or [phone]   [place], [place] [phone]   [facility] [address], [place], [place] [phone] Insurance/Medicaid/sponsorship through [facility] and Families [address]                                    [place], [place] [phone] [place] [address], [place] [phone]    Dr. Adele Schilder  [phone]   [facility] Dept. 1) [address], [place] 2) [place] 3)  [place] [address], [place] [phone]  [phone]   [place] [phone] or [phone] (After Hours)

## 2014-04-21 NOTE — ED Notes (Signed)
Pt c/o left eye pain; pt sts seen here for same and treated for pink eye; pt sts continued pain and sts medicine stings eye

## 2014-04-21 NOTE — ED Provider Notes (Signed)
CSN: [id number]     Arrival date & time 04/21/14  1035 History  This chart was scribed for Jamse Mead, PA, working with Dot Lanes, MD found by Starleen Arms, ED Scribe. This patient was seen in room TR08C/TR08C and the patient's care was started at 12:06 PM.   Chief Complaint  Patient presents with  . Eye Pain   The history is provided by the patient. No language interpreter was used.    HPI Comments: Denise Moses is a 33 y.o. female who was seen on 9/14 and diagnosed with left conjunctivitis and prescribed Tobrex.  She presents to the Emergency Department complaining of constant left eye pain and swelling with associated photophobia. Patient reports she is having difficulty seeing out of the eye due to pain upon opening the eye. Patient reports she has had drainage that is clear and sticky which causes her eye to seal shut at times, especially in the morning. She reports that she discontinued Tobrex two days ago after taking it for three days due to the pain they were causing upon use. Patient reports she has been using Visine since yesterday as well as hot/cold compresses for 6 days without relief.  Patient denies recent sick contacts.  Patient denies use of contacts/glass.  Patient denies history of DM.  Patient denies right eye complaints, nasal congestion, cough, fever, neck stiffness, ear pain.  PCP none    Past Medical History  Diagnosis Date  . Anemia   . Pregnancy induced hypertension     2001   Past Surgical History  Procedure Laterality Date  . Cholecystectomy    . No past surgeries     Family History  Problem Relation Age of Onset  . Cancer Mother   . Cancer Maternal Aunt   . Cancer Maternal Grandfather    History  Substance Use Topics  . Smoking status: Current Every Day Smoker -- 0.25 packs/day    Types: Cigarettes  . Smokeless tobacco: Not on file  . Alcohol Use: Yes   OB History   Grav Para Term Preterm Abortions TAB SAB Ect Mult Living   5 4  3 1      4      Review of Systems  Constitutional: Negative for fever.  HENT: Negative for congestion and ear pain.   Eyes: Positive for pain, redness and visual disturbance.  Respiratory: Negative for cough.   Musculoskeletal: Negative for neck stiffness.      Allergies  Review of patient's allergies indicates no known allergies.  Home Medications   Prior to Admission medications   Medication Sig Start Date End Date Taking? Authorizing Provider  ibuprofen (ADVIL,MOTRIN) 200 MG tablet Take 600 mg by mouth every 8 (eight) hours as needed (pain).   Yes Historical Provider, MD  clindamycin (CLEOCIN) 150 MG capsule Take 3 capsules (450 mg total) by mouth 3 (three) times daily. 04/21/14   Evalyne Cortopassi, PA-C   BP 124/77  Pulse 82  Temp(Src) 98.1 F (36.7 C) (Oral)  Resp 20  Ht 5\' 6"  (1.676 m)  Wt 200 lb (90.719 kg)  BMI 32.30 kg/m2  SpO2 100%  LMP 04/07/2014 Physical Exam  Nursing note and vitals reviewed. Constitutional: She is oriented to person, place, and time. She appears well-developed and well-nourished. No distress.  HENT:  Head: Normocephalic and atraumatic.  Mouth/Throat: Oropharynx is clear and moist. No oropharyngeal exudate.  Eyes: EOM are normal. Lids are everted and swept, no foreign bodies found. Right eye exhibits no chemosis,  no discharge and no exudate. No foreign body present in the right eye. Left eye exhibits discharge. Left eye exhibits no chemosis and no exudate. No foreign body present in the left eye. Right conjunctiva is injected. Right conjunctiva has no hemorrhage. Left conjunctiva is not injected. Left conjunctiva has no hemorrhage. Right eye exhibits normal extraocular motion and no nystagmus. Left eye exhibits normal extraocular motion and no nystagmus.  Fundoscopic exam:      The right eye shows no arteriolar narrowing, no AV nicking, no exudate, no hemorrhage and no papilledema.       The left eye shows no arteriolar narrowing, no AV nicking, no  exudate, no hemorrhage and no papilledema.  Left eye: Mild swelling identified to the upper and lower eyelids with mild erythema noted. Negative warmth upon palpation. Injection to the sclera of the left eye noted. Discharge identified to the medial canthus of a thick greenish discharge the left eye. Mild swelling identified to left lacrimal sac - negative inflammation or erythema noted, negative warmth upon palpation. EOMs intact with discomfort upon motion. Negative discomfort when light is placed into the right eye. Discomfort when might/into the left eye-positive photosensitivity.  Neck: Normal range of motion. Neck supple. No tracheal deviation present.  Negative neck stiffness Negative nuchal rigidity Negative cervical lymphadenopathy Negative meningeal signs  Cardiovascular: Normal rate, regular rhythm and normal heart sounds.  Exam reveals no friction rub.   No murmur heard. Pulmonary/Chest: Effort normal and breath sounds normal. No respiratory distress. She has no wheezes. She has no rales.  Musculoskeletal: Normal range of motion.  Lymphadenopathy:    She has no cervical adenopathy.  Neurological: She is alert and oriented to person, place, and time. No cranial nerve deficit. She exhibits normal muscle tone. Coordination normal.  Skin: Skin is warm and dry.  Psychiatric: She has a normal mood and affect. Her behavior is normal.    ED Course  Procedures (including critical care time)  DIAGNOSTIC STUDIES: Oxygen Saturation is 99% on RA, normal by my interpretation.    COORDINATION OF CARE:  12:13 PM Discussed need to use cold compress over eye and warm compress over nose.  Discussed need to r/o cellulitis by way of imaging.  Patient acknowledges and agrees with plan.    2:49 PM This provider spoke with Dr. Satira Sark, ophthalmologist - discussed case in great detail, imaging and results. Recommended clindamycin and for patient to follow up in his office tomorrow morning - recommended  patient to call after 8:30AM.     Visual Acuity  Right Eye Distance: 20/20 Left Eye Distance: 20/30 Bilateral Distance: 20/20  Right Eye Near:   Left Eye Near:    Bilateral Near:      Results for orders placed during the hospital encounter of 04/21/14  POC URINE PREG, ED      Result Value Ref Range   Preg Test, Ur NEGATIVE  NEGATIVE  I-STAT CHEM 8, ED      Result Value Ref Range   Sodium 138  137 - 147 mEq/L   Potassium 3.9  3.7 - 5.3 mEq/L   Chloride 105  96 - 112 mEq/L   BUN 8  6 - 23 mg/dL   Creatinine, Ser 0.60  0.50 - 1.10 mg/dL   Glucose, Bld 82  70 - 99 mg/dL   Calcium, Ion 1.26 (*) 1.12 - 1.23 mmol/L   TCO2 22  0 - 100 mmol/L   Hemoglobin 13.9  12.0 - 15.0 g/dL   HCT  41.0  36.0 - 46.0 %    Labs Review Labs Reviewed  I-STAT CHEM 8, ED - Abnormal; Notable for the following:    Calcium, Ion 1.26 (*)    All other components within normal limits  POC URINE PREG, ED    Imaging Review Ct Orbits W/cm  04/21/2014   CLINICAL DATA:  Diagnosed with he sided 1 week ago. Still with left mobile swelling and pain.  EXAM: CT ORBITS WITH CONTRAST  TECHNIQUE: Multidetector CT imaging of the orbits was performed following the bolus administration of intravenous contrast.  CONTRAST:  75 mL OMNIPAQUE IOHEXOL 300 MG/ML  SOLN  COMPARISON:  None.  FINDINGS: Globes are unremarkable. There is mild preseptal soft tissue swelling on the left. There is no postseptal inflammation. No fluid collections are seen to suggest a orbital were periorbital abscess. Small curved calcifications are noted in the anterior medial orbits bilaterally reflecting phleboliths in the superior orbital vein. The extraocular muscles are normal in size in attenuation and are symmetric.  Small mucous retention cyst in the left maxillary sinus. Sinuses are otherwise clear. Ostiomeatal complexes are patent. Septum is near midline.  Structures of the skullbase are unremarkable. No soft tissue masses or adenopathy.   IMPRESSION: 1. Mild preseptal left periorbital soft tissue swelling consistent with edema, cellulitis or a combination. 2. No abscess. 3. No postseptal inflammation. 4. Normal appearance of both globes.   Electronically Signed   By: Lajean Manes M.D.   On: 04/21/2014 14:29     EKG Interpretation None      MDM   Final diagnoses:  Left conjunctivitis  Preseptal cellulitis, left   Filed Vitals:   04/21/14 1049 04/21/14 1529  BP: 126/75 124/77  Pulse: 82 82  Temp: 98.1 F (36.7 C)   TempSrc: Oral   Resp: 20   Height: 5\' 6"  (1.676 m)   Weight: 200 lb (90.719 kg)   SpO2: 99% 100%   I personally performed the services described in this documentation, which was scribed in my presence. The recorded information has been reviewed and is accurate.  Urine pregnancy negative. I-STAT chem 8 unremarkable. CT orbits with contrast identified left mild preseptal periorbital cellulitis-no abscess, post-septal inflammation identified. Normal appearance to the globes. Doubt iritis. Doubt uveitis. Doubt corneal abrasion-negative uptake of fluorescein. Suspicion to be conjunctivitis along with preseptal cellulitis. This provider spoke with ophthalmologist, Dr. Satira Sark. As per Dr. Satira Sark, recommended patient be started on clindamycin and for patient followup in the office as an outpatient tomorrow. Discussed with patient to rest and apply compressions to the left side. Patient stable, afebrile. Patient not septic appearing. Discharged patient. Discussed with patient to closely monitor symptoms and if symptoms are to worsen or change to report back to the ED - strict return instructions given.  Patient agreed to plan of care, understood, all questions answered.   Jamse Mead, PA-C 04/21/14 1757

## 2014-04-23 NOTE — ED Provider Notes (Signed)
Medical screening examination/treatment/procedure(s) were performed by non-physician practitioner and as supervising physician I was immediately available for consultation/collaboration.   Dot Lanes, MD 04/23/14 2149

## 2014-04-27 ENCOUNTER — Emergency Department (HOSPITAL_COMMUNITY)
Admission: EM | Admit: 2014-04-27 | Discharge: 2014-04-27 | Disposition: A | Discharge status: Home / Self Care | Payer: Medicaid Other | Attending: Emergency Medicine | Admitting: Emergency Medicine

## 2014-04-27 ENCOUNTER — Encounter (HOSPITAL_COMMUNITY): Payer: Self-pay | Admitting: Emergency Medicine

## 2014-04-27 DIAGNOSIS — IMO0002 Reserved for concepts with insufficient information to code with codable children: Secondary | ICD-10-CM | POA: Insufficient documentation

## 2014-04-27 DIAGNOSIS — Z8679 Personal history of other diseases of the circulatory system: Secondary | ICD-10-CM | POA: Diagnosis not present

## 2014-04-27 DIAGNOSIS — Z862 Personal history of diseases of the blood and blood-forming organs and certain disorders involving the immune mechanism: Secondary | ICD-10-CM | POA: Insufficient documentation

## 2014-04-27 DIAGNOSIS — L02412 Cutaneous abscess of left axilla: Secondary | ICD-10-CM

## 2014-04-27 DIAGNOSIS — F17200 Nicotine dependence, unspecified, uncomplicated: Secondary | ICD-10-CM | POA: Insufficient documentation

## 2014-04-27 DIAGNOSIS — Z792 Long term (current) use of antibiotics: Secondary | ICD-10-CM | POA: Insufficient documentation

## 2014-04-27 MED ORDER — TRAMADOL HCL 50 MG PO TABS: 50.0000 mg | ORAL_TABLET | Freq: Four times a day (QID) | ORAL | Status: DC | PRN

## 2014-04-27 MED ORDER — TRAMADOL HCL 50 MG PO TABS
50.0000 mg | ORAL_TABLET | Freq: Once | ORAL | Status: AC
  Administered 2014-04-27: 50 mg via ORAL
  Filled 2014-04-27: qty 1

## 2014-04-27 MED ORDER — LIDOCAINE-EPINEPHRINE (PF) 2 %-1:200000 IJ SOLN
10.0000 mL | Freq: Once | INTRAMUSCULAR | Status: AC
  Administered 2014-04-27: 10 mL
  Filled 2014-04-27: qty 20

## 2014-04-27 NOTE — ED Notes (Signed)
Pt. Stated, i have an abscess under my left arm

## 2014-04-27 NOTE — Discharge Instructions (Signed)
Please follow up with your primary care physician in 1-2 days. If you do not have one please call the [place] number listed above. Please use warm compresses to the area to help with symptoms. Please take pain medication and/or muscle relaxants as prescribed and as needed for pain. Please do not drive on narcotic pain medication or on muscle relaxants.  Please read all discharge instructions and return precautions.    Abscess Care After An abscess (also called a boil or furuncle) is an infected area that contains a collection of pus. Signs and symptoms of an abscess include pain, tenderness, redness, or hardness, or you may feel a moveable soft area under your skin. An abscess can occur anywhere in the body. The infection may spread to surrounding tissues causing cellulitis. A cut (incision) by the surgeon was made over your abscess and the pus was drained out. Gauze may have been packed into the space to provide a drain that will allow the cavity to heal from the inside outwards. The boil may be painful for 5 to 7 days. Most people with a boil do not have high fevers. Your abscess, if seen early, may not have localized, and may not have been lanced. If not, another appointment may be required for this if it does not get better on its own or with medications. HOME CARE INSTRUCTIONS   Only take over-the-counter or prescription medicines for pain, discomfort, or fever as directed by your caregiver.  When you bathe, soak and then remove gauze or iodoform packs at least daily or as directed by your caregiver. You may then wash the wound gently with mild soapy water. Repack with gauze or do as your caregiver directs. SEEK IMMEDIATE MEDICAL CARE IF:   You develop increased pain, swelling, redness, drainage, or bleeding in the wound site.  You develop signs of generalized infection including muscle aches, chills, fever, or a general ill feeling.  An oral temperature above 102 F  (38.9 C) develops, not controlled by medication. See your caregiver for a recheck if you develop any of the symptoms described above. If medications (antibiotics) were prescribed, take them as directed. Document Released: 02/04/2005 Document Revised: 10/11/2011 Document Reviewed: 10/02/2007 [facility] Patient Information 2015 [place], [place]. This information is not intended to replace advice given to you by your health care provider. Make sure you discuss any questions you have with your health care provider.

## 2014-04-27 NOTE — ED Provider Notes (Signed)
CSN: [id number]     Arrival date & time 04/27/14  1355 History  This chart was scribed for non-physician practitioner working with Tanna Furry, MD by Mercy Moore, ED Scribe. This patient was seen in room TR11C/TR11C and the patient's care was started at 2:48 PM.   Chief Complaint  Patient presents with  . Abscess   The history is provided by the patient. No language interpreter was used.   HPI Comments: Denise Moses is a 33 y.o. female who presents to the Emergency Department complaining of left axillary abscess, which she noticed one week ago. Patient states that the abscess initiated as a small pimple which she treated with warm compresses. Despite her treatment she reports progressively worsening pain and growth. Patient reports history of axillary abscess and surgical removal of past abscess.   Past Medical History  Diagnosis Date  . Anemia   . Pregnancy induced hypertension     2001   Past Surgical History  Procedure Laterality Date  . Cholecystectomy    . No past surgeries     Family History  Problem Relation Age of Onset  . Cancer Mother   . Cancer Maternal Aunt   . Cancer Maternal Grandfather    History  Substance Use Topics  . Smoking status: Current Every Day Smoker -- 0.25 packs/day    Types: Cigarettes  . Smokeless tobacco: Not on file  . Alcohol Use: Yes   OB History   Grav Para Term Preterm Abortions TAB SAB Ect Mult Living   5 4 3 1      4      Review of Systems  Skin: Positive for wound.       Abscess   Neurological: Negative for weakness and numbness.  All other systems reviewed and are negative.   Allergies  Review of patient's allergies indicates no known allergies.  Home Medications   Prior to Admission medications   Medication Sig Start Date End Date Taking? Authorizing Provider  clindamycin (CLEOCIN) 150 MG capsule Take 3 capsules (450 mg total) by mouth 3 (three) times daily. 04/21/14   Marissa Sciacca, PA-C  ibuprofen  (ADVIL,MOTRIN) 200 MG tablet Take 600 mg by mouth every 8 (eight) hours as needed (pain).    Historical Provider, MD  traMADol (ULTRAM) 50 MG tablet Take 1 tablet (50 mg total) by mouth every 6 (six) hours as needed. 04/27/14   Stephani Police [employer], PA-C   Triage Vitals: BP 128/64  Pulse 80  Temp(Src) 98.2 F (36.8 C) (Oral)  Resp 16  Ht 5\' 6"  (1.676 m)  Wt 200 lb (90.719 kg)  BMI 32.30 kg/m2  SpO2 100%  LMP 04/07/2014  Physical Exam  Nursing note and vitals reviewed. Constitutional: She is oriented to person, place, and time. She appears well-developed and well-nourished. No distress.  HENT:  Head: Normocephalic and atraumatic.  Right Ear: External ear normal.  Left Ear: External ear normal.  Nose: Nose normal.  Mouth/Throat: Oropharynx is clear and moist.  Eyes: Conjunctivae are normal.  Neck: Normal range of motion. Neck supple.  Cardiovascular: Normal rate.   Pulmonary/Chest: Effort normal.  Abdominal: Soft.  Musculoskeletal: Normal range of motion.  Neurological: She is alert and oriented to person, place, and time.  Skin: Skin is warm and dry. She is not diaphoretic.  2x2 cm left axillary abscess. Fluctuant area with mild eryethema. No purulent drainage.   Psychiatric: She has a normal mood and affect.    ED Course  Procedures (including critical care  time)  INCISION AND DRAINAGE Performed by: Jossie Ng PA-S Authorized by: Harlow Mares Consent: Verbal consent obtained. Risks and benefits: risks, benefits and alternatives were discussed Type: abscess  Body area: left axilla  Anesthesia: local infiltration  Incision was made with a scalpel.  Local anesthetic: lidocaine 2% w/o epinephrine  Anesthetic total: 1 ml  Complexity: complex Blunt dissection to break up loculations  Drainage: purulent  Drainage amount: copious  Packing material: NA  Patient tolerance: Patient tolerated the procedure well with no immediate  complications.    COORDINATION OF CARE: 2:50 PM- Plans to incise and drain. Discussed treatment plan with patient at bedside and patient agreed to plan.   Labs Review Labs Reviewed - No data to display  Imaging Review No results found.   EKG Interpretation None      MDM   Final diagnoses:  Abscess of left axilla    Filed Vitals:   04/27/14 1410  BP: 128/64  Pulse: 80  Temp: 98.2 F (36.8 C)  Resp: 16   Afebrile, NAD, non-toxic appearing, AAOx4.  Patient with skin abscess amenable to incision and drainage.  Abscess was not large enough to packing or drain,  wound recheck in 2 days. Encouraged home warm soaks and flushing.  Mild signs of cellulitis is surrounding skin.  Will d/c to home.  No antibiotic therapy is indicated.   I personally performed the services described in this documentation, which was scribed in my presence. The recorded information has been reviewed and is accurate.    Harlow Mares, PA-C 04/27/14 1544

## 2014-04-28 NOTE — ED Provider Notes (Signed)
Medical screening examination/treatment/procedure(s) were performed by non-physician practitioner and as supervising physician I was immediately available for consultation/collaboration.   EKG Interpretation None        Tanna Furry, MD 04/28/14 1521

## 2014-05-01 ENCOUNTER — Emergency Department: Payer: Self-pay | Admitting: Emergency Medicine

## 2014-05-12 ENCOUNTER — Encounter (HOSPITAL_COMMUNITY): Payer: Self-pay | Admitting: Emergency Medicine

## 2014-05-12 ENCOUNTER — Emergency Department (HOSPITAL_COMMUNITY): Payer: No Typology Code available for payment source

## 2014-05-12 ENCOUNTER — Emergency Department (HOSPITAL_COMMUNITY)
Admission: EM | Admit: 2014-05-12 | Discharge: 2014-05-12 | Disposition: A | Discharge status: Home / Self Care | Payer: No Typology Code available for payment source | Attending: Emergency Medicine | Admitting: Emergency Medicine

## 2014-05-12 DIAGNOSIS — Z720 Tobacco use: Secondary | ICD-10-CM | POA: Diagnosis not present

## 2014-05-12 DIAGNOSIS — Y9389 Activity, other specified: Secondary | ICD-10-CM | POA: Insufficient documentation

## 2014-05-12 DIAGNOSIS — S3992XA Unspecified injury of lower back, initial encounter: Secondary | ICD-10-CM | POA: Insufficient documentation

## 2014-05-12 DIAGNOSIS — Z862 Personal history of diseases of the blood and blood-forming organs and certain disorders involving the immune mechanism: Secondary | ICD-10-CM | POA: Insufficient documentation

## 2014-05-12 DIAGNOSIS — S8991XA Unspecified injury of right lower leg, initial encounter: Secondary | ICD-10-CM | POA: Insufficient documentation

## 2014-05-12 DIAGNOSIS — S79911A Unspecified injury of right hip, initial encounter: Secondary | ICD-10-CM | POA: Insufficient documentation

## 2014-05-12 DIAGNOSIS — Z792 Long term (current) use of antibiotics: Secondary | ICD-10-CM | POA: Insufficient documentation

## 2014-05-12 DIAGNOSIS — M545 Low back pain, unspecified: Secondary | ICD-10-CM

## 2014-05-12 DIAGNOSIS — Y92410 Unspecified street and highway as the place of occurrence of the external cause: Secondary | ICD-10-CM | POA: Diagnosis not present

## 2014-05-12 MED ORDER — CYCLOBENZAPRINE HCL 10 MG PO TABS: 10.0000 mg | ORAL_TABLET | Freq: Two times a day (BID) | ORAL | Status: DC | PRN

## 2014-05-12 MED ORDER — TRAMADOL HCL 50 MG PO TABS: 50.0000 mg | ORAL_TABLET | Freq: Four times a day (QID) | ORAL | Status: DC | PRN

## 2014-05-12 MED ORDER — IBUPROFEN 600 MG PO TABS: 600.0000 mg | ORAL_TABLET | Freq: Four times a day (QID) | ORAL | Status: DC | PRN

## 2014-05-12 MED ORDER — TRAMADOL HCL 50 MG PO TABS
50.0000 mg | ORAL_TABLET | Freq: Once | ORAL | Status: AC
  Administered 2014-05-12: 50 mg via ORAL
  Filled 2014-05-12: qty 1
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
## 2014-05-12 NOTE — ED Notes (Signed)
Declined W/C at D/C and was escorted to lobby by RN. 

## 2014-05-12 NOTE — ED Provider Notes (Signed)
Medical screening examination/treatment/procedure(s) were performed by non-physician practitioner and as supervising physician I was immediately available for consultation/collaboration.   EKG Interpretation None        Ezequiel Essex, MD 05/12/14 1806
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
## 2014-06-03 ENCOUNTER — Encounter (HOSPITAL_COMMUNITY): Payer: Self-pay | Admitting: Emergency Medicine

## 2014-07-03 ENCOUNTER — Encounter (HOSPITAL_COMMUNITY): Payer: Self-pay | Admitting: *Deleted

## 2014-07-03 ENCOUNTER — Inpatient Hospital Stay (HOSPITAL_COMMUNITY)
Admission: AD | Admit: 2014-07-03 | Discharge: 2014-07-04 | Disposition: A | Discharge status: Home / Self Care | Payer: Medicaid Other | Source: Ambulatory Visit | Attending: Obstetrics & Gynecology | Admitting: Obstetrics & Gynecology

## 2014-07-03 DIAGNOSIS — I97418 Intraoperative hemorrhage and hematoma of a circulatory system organ or structure complicating other circulatory system procedure: Secondary | ICD-10-CM

## 2014-07-03 DIAGNOSIS — Z3A12 12 weeks gestation of pregnancy: Secondary | ICD-10-CM | POA: Diagnosis not present

## 2014-07-03 DIAGNOSIS — O209 Hemorrhage in early pregnancy, unspecified: Secondary | ICD-10-CM | POA: Diagnosis present

## 2014-07-03 DIAGNOSIS — O99331 Smoking (tobacco) complicating pregnancy, first trimester: Secondary | ICD-10-CM | POA: Diagnosis not present

## 2014-07-03 DIAGNOSIS — F17200 Nicotine dependence, unspecified, uncomplicated: Secondary | ICD-10-CM | POA: Diagnosis present

## 2014-07-03 DIAGNOSIS — O200 Threatened abortion: Secondary | ICD-10-CM | POA: Diagnosis not present

## 2014-07-03 DIAGNOSIS — F17210 Nicotine dependence, cigarettes, uncomplicated: Secondary | ICD-10-CM | POA: Diagnosis not present

## 2014-07-03 DIAGNOSIS — Z3490 Encounter for supervision of normal pregnancy, unspecified, unspecified trimester: Secondary | ICD-10-CM

## 2014-07-03 DIAGNOSIS — R58 Hemorrhage, not elsewhere classified: Secondary | ICD-10-CM

## 2014-07-03 LAB — URINALYSIS, ROUTINE W REFLEX MICROSCOPIC
Bilirubin Urine: NEGATIVE
GLUCOSE, UA: NEGATIVE mg/dL
KETONES UR: NEGATIVE mg/dL
LEUKOCYTES UA: NEGATIVE
NITRITE: NEGATIVE
Protein, ur: NEGATIVE mg/dL
Specific Gravity, Urine: 1.02 (ref 1.005–1.030)
UROBILINOGEN UA: 2 mg/dL — AB (ref 0.0–1.0)
pH: 6.5 (ref 5.0–8.0)

## 2014-07-03 LAB — CBC
HEMATOCRIT: 32.7 % — AB (ref 36.0–46.0)
Hemoglobin: 11.2 g/dL — ABNORMAL LOW (ref 12.0–15.0)
MCH: 29.2 pg (ref 26.0–34.0)
MCHC: 34.3 g/dL (ref 30.0–36.0)
MCV: 85.2 fL (ref 78.0–100.0)
Platelets: 224 10*3/uL (ref 150–400)
RBC: 3.84 MIL/uL — ABNORMAL LOW (ref 3.87–5.11)
RDW: 15.6 % — ABNORMAL HIGH (ref 11.5–15.5)
WBC: 7.9 10*3/uL (ref 4.0–10.5)

## 2014-07-03 LAB — URINE MICROSCOPIC-ADD ON

## 2014-07-03 LAB — WET PREP, GENITAL
Clue Cells Wet Prep HPF POC: NONE SEEN
Trich, Wet Prep: NONE SEEN
Yeast Wet Prep HPF POC: NONE SEEN

## 2014-07-03 LAB — POCT PREGNANCY, URINE: PREG TEST UR: POSITIVE — AB

## 2014-07-03 MED ORDER — PROMETHAZINE HCL 25 MG PO TABS
25.0000 mg | ORAL_TABLET | Freq: Once | ORAL | Status: AC
  Administered 2014-07-03: 25 mg via ORAL
  Filled 2014-07-03: qty 1

## 2014-07-03 MED ORDER — OXYCODONE-ACETAMINOPHEN 5-325 MG PO TABS
1.0000 | ORAL_TABLET | Freq: Once | ORAL | Status: AC
Start: 2014-07-03 — End: 2014-07-03
  Administered 2014-07-03: 1 via ORAL
  Filled 2014-07-03: qty 1
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
## 2014-07-03 NOTE — MAU Note (Signed)
Pt presented to MAU via EMS c/o bleeding that began this evening after she was decorating her Christmas tree and reached up. Pt is currently under the care of a physician across from [facility] but does not know the name.

## 2014-07-04 ENCOUNTER — Inpatient Hospital Stay (HOSPITAL_COMMUNITY): Payer: Medicaid Other

## 2014-07-04 LAB — HIV ANTIBODY (ROUTINE TESTING W REFLEX): HIV 1&2 Ab, 4th Generation: NONREACTIVE

## 2014-07-04 LAB — ABO/RH: ABO/RH(D): O POS

## 2014-07-04 LAB — GC/CHLAMYDIA PROBE AMP
CT Probe RNA: NEGATIVE
GC Probe RNA: NEGATIVE

## 2014-07-04 LAB — HCG, QUANTITATIVE, PREGNANCY: HCG, BETA CHAIN, QUANT, S: 7897 m[IU]/mL — AB (ref <5)

## 2014-07-04 MED ORDER — OXYCODONE-ACETAMINOPHEN 5-325 MG PO TABS: 2.0000 | ORAL_TABLET | ORAL | Status: DC | PRN

## 2014-07-04 MED ORDER — PROMETHAZINE HCL 25 MG PO TABS: 25.0000 mg | ORAL_TABLET | Freq: Four times a day (QID) | ORAL | Status: DC | PRN

## 2014-07-04 NOTE — Discharge Instructions (Signed)

## 2014-07-18 ENCOUNTER — Ambulatory Visit (HOSPITAL_COMMUNITY): Admission: RE | Admit: 2014-07-18 | Payer: Medicaid Other | Source: Ambulatory Visit

## 2014-09-15 ENCOUNTER — Emergency Department (HOSPITAL_COMMUNITY)
Admission: EM | Admit: 2014-09-15 | Discharge: 2014-09-15 | Disposition: A | Discharge status: Home / Self Care | Payer: Medicaid Other | Attending: Emergency Medicine | Admitting: Emergency Medicine

## 2014-09-15 ENCOUNTER — Encounter (HOSPITAL_COMMUNITY): Payer: Self-pay | Admitting: Emergency Medicine

## 2014-09-15 DIAGNOSIS — L02412 Cutaneous abscess of left axilla: Secondary | ICD-10-CM | POA: Insufficient documentation

## 2014-09-15 DIAGNOSIS — L089 Local infection of the skin and subcutaneous tissue, unspecified: Secondary | ICD-10-CM | POA: Diagnosis present

## 2014-09-15 DIAGNOSIS — Z862 Personal history of diseases of the blood and blood-forming organs and certain disorders involving the immune mechanism: Secondary | ICD-10-CM | POA: Diagnosis not present

## 2014-09-15 DIAGNOSIS — Z720 Tobacco use: Secondary | ICD-10-CM | POA: Insufficient documentation

## 2014-09-15 MED ORDER — HYDROCODONE-ACETAMINOPHEN 5-325 MG PO TABS: 2.0000 | ORAL_TABLET | ORAL | Status: DC | PRN

## 2014-09-15 MED ORDER — SULFAMETHOXAZOLE-TRIMETHOPRIM 800-160 MG PO TABS: 1.0000 | ORAL_TABLET | Freq: Two times a day (BID) | ORAL | Status: DC

## 2014-09-15 MED ORDER — HYDROCODONE-ACETAMINOPHEN 5-325 MG PO TABS
2.0000 | ORAL_TABLET | Freq: Once | ORAL | Status: AC
Start: 2014-09-15 — End: 2014-09-15
  Administered 2014-09-15: 2 via ORAL
  Filled 2014-09-15: qty 2

## 2014-09-15 MED ORDER — LIDOCAINE HCL 2 % IJ SOLN
10.0000 mL | Freq: Once | INTRAMUSCULAR | Status: AC
  Administered 2014-09-15: 200 mg
  Filled 2014-09-15: qty 20
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
## 2014-09-15 NOTE — ED Notes (Signed)
Declined W/C at D/C and was escorted to lobby by RN. 

## 2014-09-15 NOTE — Discharge Instructions (Signed)

## 2014-09-15 NOTE — ED Provider Notes (Signed)
CSN: [id number]     Arrival date & time 09/15/14  1051 History  This chart was scribed for non-physician practitioner, Fransico Meadow, PA-C,  working with Dorie Rank, MD, by Ian Bushman, ED Scribe. This patient was seen in room TR06C/TR06C and the patient's care was started at 11:17 AM.   None    Chief Complaint  Patient presents with  . Recurrent Skin Infections     (Consider location/radiation/quality/duration/timing/severity/associated sxs/prior Treatment) HPI  HPI Comments: Denise Moses is a 34 y.o. female who presents to the Emergency Department complaining of a boil which she describes to be bigger than a golf ball under her right arm onset 3-4 days ago. Patient has a history of a boil in the same spot and has been drained in the past. Denies any drainage from her boil today.  Patient is not a diabetic and does not have any other medical conditions.  Patient does not have any other complaints today.       Past Medical History  Diagnosis Date  . Anemia   . Pregnancy induced hypertension     2001   Past Surgical History  Procedure Laterality Date  . Cholecystectomy     Family History  Problem Relation Age of Onset  . Cancer Mother   . Cancer Maternal Aunt   . Cancer Maternal Grandfather    History  Substance Use Topics  . Smoking status: Current Every Day Smoker -- 0.25 packs/day    Types: Cigarettes  . Smokeless tobacco: Not on file  . Alcohol Use: Yes   OB History    Gravida Para Term Preterm AB TAB SAB Ectopic Multiple Living   6 5 4 1      5      Review of Systems  Constitutional: Negative for fever and chills.  Respiratory: Negative for cough, chest tightness and shortness of breath.       Allergies  Review of patient's allergies indicates no known allergies.  Home Medications   Prior to Admission medications   Medication Sig Start Date End Date Taking? Authorizing Provider  oxyCODONE-acetaminophen (PERCOCET/ROXICET) 5-325 MG per tablet  Take 2 tablets by mouth every 4 (four) hours as needed for moderate pain or severe pain. 07/04/14   Olegario Messier, NP  promethazine (PHENERGAN) 25 MG tablet Take 1 tablet (25 mg total) by mouth every 6 (six) hours as needed for nausea or vomiting. 07/04/14   Olegario Messier, NP   BP 142/72 mmHg  Pulse 80  Temp(Src) 97.8 F (36.6 C) (Oral)  Resp 20  Ht 5\' 6"  (1.676 m)  Wt 220 lb (99.791 kg)  BMI 35.53 kg/m2  SpO2 100%  LMP 09/15/2014  Breastfeeding? Unknown Physical Exam  Constitutional: She is oriented to person, place, and time. She appears well-developed and well-nourished. No distress.  HENT:  Head: Normocephalic and atraumatic.  Eyes: Conjunctivae and EOM are normal.  Neck: Neck supple.  Cardiovascular: Normal rate.   Pulmonary/Chest: Effort normal. No respiratory distress.  Musculoskeletal: Normal range of motion.  Neurological: She is alert and oriented to person, place, and time.  Skin: Skin is warm and dry.  Four by two cm swollen pointing area right axilla.   Psychiatric: She has a normal mood and affect. Her behavior is normal.  Nursing note and vitals reviewed.   ED Course  INCISION AND DRAINAGE Date/Time: 09/15/2014 4:25 PM Performed by: Fransico Meadow Authorized by: Fransico Meadow Consent: Verbal consent not obtained. Risks and benefits: risks, benefits  and alternatives were discussed Consent given by: patient Patient understanding: patient does not state understanding of the procedure being performed Required items: required blood products, implants, devices, and special equipment available Time out: Immediately prior to procedure a "time out" was called to verify the correct patient, procedure, equipment, support staff and site/side marked as required. Type: abscess Body area: upper extremity Anesthesia: local infiltration Patient sedated: no Scalpel size: 11 Incision type: single straight Complexity: simple Drainage: purulent Drainage amount:  moderate Wound treatment: wound left open Patient tolerance: Patient tolerated the procedure well with no immediate complications Comments: Large amount of purulent drainage   (including critical care time) DIAGNOSTIC STUDIES: Oxygen Saturation is 100% on RA, normal by my interpretation.    COORDINATION OF CARE: 11:20 AM Discussed treatment plan with patient at beside, the patient agrees with the plan and has no further questions at this time.  Labs Review Labs Reviewed - No data to display  Imaging Review No results found.   EKG Interpretation None      MDM   Final diagnoses:  Abscess of left axilla   Bactrim ds Hydrocodone Return if any problems.  I personally performed the services in this documentation, which was scribed in my presence.  The recorded information has been reviewed and considered.   Ronnald Collum.     Hollace Kinnier [place], PA-C 09/15/14 [place], MD 09/17/14 [phone]

## 2014-11-24 ENCOUNTER — Encounter (HOSPITAL_COMMUNITY): Payer: Self-pay | Admitting: *Deleted

## 2014-11-24 ENCOUNTER — Emergency Department (HOSPITAL_COMMUNITY)
Admission: EM | Admit: 2014-11-24 | Discharge: 2014-11-24 | Disposition: A | Discharge status: Home / Self Care | Payer: Medicaid Other | Attending: Emergency Medicine | Admitting: Emergency Medicine

## 2014-11-24 DIAGNOSIS — D649 Anemia, unspecified: Secondary | ICD-10-CM | POA: Insufficient documentation

## 2014-11-24 DIAGNOSIS — Z720 Tobacco use: Secondary | ICD-10-CM | POA: Diagnosis not present

## 2014-11-24 DIAGNOSIS — J111 Influenza due to unidentified influenza virus with other respiratory manifestations: Secondary | ICD-10-CM | POA: Insufficient documentation

## 2014-11-24 DIAGNOSIS — Z79899 Other long term (current) drug therapy: Secondary | ICD-10-CM | POA: Diagnosis not present

## 2014-11-24 MED ORDER — GUAIFENESIN 100 MG/5ML PO LIQD: 100.0000 mg | ORAL | Status: DC | PRN

## 2014-11-24 MED ORDER — OSELTAMIVIR PHOSPHATE 75 MG PO CAPS: 75.0000 mg | ORAL_CAPSULE | Freq: Two times a day (BID) | ORAL | Status: DC

## 2014-11-24 MED ORDER — NAPROXEN 500 MG PO TABS: 500.0000 mg | ORAL_TABLET | Freq: Two times a day (BID) | ORAL | Status: DC

## 2014-11-24 NOTE — ED Provider Notes (Signed)
CSN: [id number]     Arrival date & time 11/24/14  1114 History   First MD Initiated Contact with Patient 11/24/14 1138     Chief Complaint  Patient presents with  . Influenza   Denise Moses is a 34 y.o. who is otherwise healthy presents to the emergency department complaining of flulike symptoms ongoing since yesterday morning. The patient reports she woke up yesterday with body aches which progressed to a runny nose and active cough associated with fevers and chills. The patient reports she had fever yesterday of 101.2. 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 Patient complains of 8 out of 10 body aches. Patient reports taking Tylenol most recently at 7 AM this morning with some relief. The patient denies wheezing, shortness of breath, abdominal pain, nausea, vomiting, diarrhea, ear pain, rashes, urinary symptoms, or trouble swallowing.  (Consider location/radiation/quality/duration/timing/severity/associated sxs/prior Treatment) HPI  Past Medical History  Diagnosis Date  . Anemia   . Pregnancy induced hypertension     2001   Past Surgical History  Procedure Laterality Date  . Cholecystectomy     Family History  Problem Relation Age of Onset  . Cancer Mother   . Cancer Maternal Aunt   . Cancer Maternal Grandfather    History  Substance Use Topics  . Smoking status: Current Every Day Smoker -- 0.25 packs/day    Types: Cigarettes  . Smokeless tobacco: Not on file  . Alcohol Use: Yes   OB History    Gravida Para Term Preterm AB TAB SAB Ectopic Multiple Living   6 5 4 1      5      Review of Systems  Constitutional: Positive for fever, chills and fatigue.  HENT: Positive for congestion, rhinorrhea, sneezing and sore throat. Negative for ear pain, postnasal drip, sinus pressure and trouble swallowing.   Eyes: Negative for pain and visual disturbance.  Respiratory: Positive for cough. Negative for shortness of breath and  wheezing.   Cardiovascular: Negative for chest pain and palpitations.  Gastrointestinal: Negative for nausea, vomiting, abdominal pain and diarrhea.  Genitourinary: Negative for dysuria, urgency, frequency, hematuria and difficulty urinating.  Musculoskeletal: Negative for back pain and neck pain.  Skin: Negative for rash.  Neurological: Negative for weakness, light-headedness and headaches.      Allergies  Review of patient's allergies indicates no known allergies.  Home Medications   Prior to Admission medications   Medication Sig Start Date End Date Taking? Authorizing Provider  guaiFENesin (ROBITUSSIN) 100 MG/5ML liquid Take 5-10 mLs (100-200 mg total) by mouth every 4 (four) hours as needed for cough. 11/24/14   Waynetta Pean, PA-C  HYDROcodone-acetaminophen (NORCO/VICODIN) 5-325 MG per tablet Take 2 tablets by mouth every 4 (four) hours as needed. 09/15/14   Fransico Meadow, PA-C  naproxen (NAPROSYN) 500 MG tablet Take 1 tablet (500 mg total) by mouth 2 (two) times daily with a meal. 11/24/14   Waynetta Pean, PA-C  oseltamivir (TAMIFLU) 75 MG capsule Take 1 capsule (75 mg total) by mouth every 12 (twelve) hours. 11/24/14   Waynetta Pean, PA-C  oxyCODONE-acetaminophen (PERCOCET/ROXICET) 5-325 MG per tablet Take 2 tablets by mouth every 4 (four) hours as needed for moderate pain or severe pain. 07/04/14   Olegario Messier, NP  promethazine (PHENERGAN) 25 MG tablet Take 1 tablet (25 mg total) by mouth every 6 (six) hours as needed for nausea or vomiting. 07/04/14   Olegario Messier, NP  sulfamethoxazole-trimethoprim (SEPTRA DS) 800-160 MG per tablet Take 1 tablet by mouth every 12 (twelve) hours. 09/15/14   Fransico Meadow, PA-C   BP 128/65 mmHg  Pulse 74  Temp(Src) 98.5 F (36.9 C) (Oral)  Resp 18  Ht 5\' 6"  (1.676 m)  Wt 220 lb (99.791 kg)  BMI 35.53 kg/m2  SpO2 100%  LMP 11/05/2014 Physical Exam  Constitutional: She is oriented to person, place, and time. She appears  well-developed and well-nourished. No distress.  Nontoxic appearing.  HENT:  Head: Normocephalic and atraumatic.  Right Ear: External ear normal.  Left Ear: External ear normal.  Mouth/Throat: Oropharynx is clear and moist. No oropharyngeal exudate.  No tonsillar hypertrophy or exudates. Bilateral tympanic membranes are pearly-gray without erythema or loss of landmarks.  Eyes: Conjunctivae are normal. Pupils are equal, round, and reactive to light. Right eye exhibits no discharge. Left eye exhibits no discharge.  Neck: Normal range of motion. Neck supple. No JVD present. No tracheal deviation present.  Cardiovascular: Normal rate, regular rhythm, normal heart sounds and intact distal pulses.  Exam reveals no gallop and no friction rub.   No murmur heard. Pulmonary/Chest: Effort normal and breath sounds normal. No respiratory distress. She has no wheezes. She has no rales.  Lungs are clear to auscultation bilaterally. Coughing during my interview.   Abdominal: Soft. She exhibits no distension. There is no tenderness.  Abdomen is soft and nontender to palpation.  Musculoskeletal: She exhibits no edema.  Lymphadenopathy:    She has no cervical adenopathy.  Neurological: She is alert and oriented to person, place, and time. Coordination normal.  Skin: Skin is warm and dry. No rash noted. She is not diaphoretic. No erythema. No pallor.  Psychiatric: She has a normal mood and affect. Her behavior is normal.  Nursing note and vitals reviewed.   ED Course  Procedures (including critical care time) Labs Review Labs Reviewed - No data to display  Imaging Review No results found.   EKG Interpretation None      Filed Vitals:   11/24/14 1125  BP: 128/65  Pulse: 74  Temp: 98.5 F (36.9 C)  TempSrc: Oral  Resp: 18  Height: 5\' 6"  (1.676 m)  Weight: 220 lb (99.791 kg)  SpO2: 100%     MDM   Meds given in ED:  Medications - No data to display  New Prescriptions   GUAIFENESIN  (ROBITUSSIN) 100 MG/5ML LIQUID    Take 5-10 mLs (100-200 mg total) by mouth every 4 (four) hours as needed for cough.   NAPROXEN (NAPROSYN) 500 MG TABLET    Take 1 tablet (500 mg total) by mouth 2 (two) times daily with a meal.   OSELTAMIVIR (TAMIFLU) 75 MG CAPSULE    Take 1 capsule (75 mg total) by mouth every 12 (twelve) hours.    Final diagnoses:  Influenza   Patient with symptoms consistent with influenza.  Vitals are stable, afebrile in the ED. Nontoxic appearing. No signs of dehydration, tolerating PO's.  Lungs are clear. Due to patient's presentation and physical exam a chest x-ray was not ordered bc likely diagnosis of flu.  Discussed the cost versus benefit of Tamiflu treatment with the patient.  As patient's symptoms began less than 48 hours ago will discharge with prescription for Tamiflu.  Patient will be discharged with instructions to orally hydrate, rest, and use over-the-counter medications such as anti-inflammatories ibuprofen and Aleve for muscle aches and Tylenol for fever.  Patient will also be given a cough suppressant.  Strict return precautions provided. I advised the patient to follow-up with their primary care provider this week. I advised the patient to return to the emergency department with new or worsening symptoms or new concerns. The patient verbalized understanding and agreement with plan.       Waynetta Pean, PA-C 11/24/14 1232  Leonard Schwartz, MD 11/27/14 1016

## 2014-11-24 NOTE — Discharge Instructions (Signed)
Influenza Influenza ("the flu") is a viral infection of the respiratory tract. It occurs more often in winter months because people spend more time in close contact with one another. Influenza can make you feel very sick. Influenza easily spreads from person to person (contagious). CAUSES  Influenza is caused by a virus that infects the respiratory tract. You can catch the virus by breathing in droplets from an infected person's cough or sneeze. You can also catch the virus by touching something that was recently contaminated with the virus and then touching your mouth, nose, or eyes. RISKS AND COMPLICATIONS You may be at risk for a more severe case of influenza if you smoke cigarettes, have diabetes, have chronic heart disease (such as heart failure) or lung disease (such as asthma), or if you have a weakened immune system. Elderly people and pregnant women are also at risk for more serious infections. The most common problem of influenza is a lung infection (pneumonia). Sometimes, this problem can require emergency medical care and may be life threatening. SIGNS AND SYMPTOMS  Symptoms typically last 4 to 10 days and may include:  Fever.  Chills.  Headache, body aches, and muscle aches.  Sore throat.  Chest discomfort and cough.  Poor appetite.  Weakness or feeling tired.  Dizziness.  Nausea or vomiting. DIAGNOSIS  Diagnosis of influenza is often made based on your history and a physical exam. A nose or throat swab test can be done to confirm the diagnosis. TREATMENT  In mild cases, influenza goes away on its own. Treatment is directed at relieving symptoms. For more severe cases, your health care provider may prescribe antiviral medicines to shorten the sickness. Antibiotic medicines are not effective because the infection is caused by a virus, not by bacteria. HOME CARE INSTRUCTIONS  Take medicines only as directed by your health care provider.  Use a cool mist humidifier to make  breathing easier.  Get plenty of rest until your temperature returns to normal. This usually takes 3 to 4 days.  Drink enough fluid to keep your urine clear or pale yellow.  Cover yourmouth and nosewhen coughing or sneezing,and wash your handswellto prevent thevirusfrom spreading.  Stay homefromwork orschool untilthe fever is gonefor at least 14full day. PREVENTION  An annual influenza vaccination (flu shot) is the best way to avoid getting influenza. An annual flu shot is now routinely recommended for all adults in the [place] IF:  You experiencechest pain, yourcough worsens,or you producemore mucus.  Youhave nausea,vomiting, ordiarrhea.  Your fever returns or gets worse. SEEK IMMEDIATE MEDICAL CARE IF:  You havetrouble breathing, you become short of breath,or your skin ornails becomebluish.  You have severe painor stiffnessin the neck.  You develop a sudden headache, or pain in the face or ear.  You have nausea or vomiting that you cannot control. MAKE SURE YOU:   Understand these instructions.  Will watch your condition.  Will get help right away if you are not doing well or get worse. Document Released: 07/16/2000 Document Revised: 12/03/2013 Document Reviewed: 10/18/2011 [facility] Patient Information 2015 [place], [place]. This information is not intended to replace advice given to you by your health care provider. Make sure you discuss any questions you have with your health care provider. Oseltamivir capsules What is this medicine? OSELTAMIVIR (os el TAM i vir) is an antiviral medicine. It is used to prevent and to treat some kinds of influenza or the flu. It will not work for colds or other viral infections.  This medicine may be used for other purposes; ask your health care provider or pharmacist if you have questions. COMMON BRAND NAME(S): Tamiflu What should I tell my health care provider before I take this medicine? They need  to know if you have any of the following conditions: -heart disease -immune system problems -kidney disease -liver disease -lung disease -an unusual or allergic reaction to oseltamivir, other medicines, foods, dyes, or preservatives -pregnant or trying to get pregnant -breast-feeding How should I use this medicine? Take this medicine by mouth with a glass of water. Follow the directions on the prescription label. Start this medicine at the first sign of flu symptoms. You can take it with or without food. If it upsets your stomach, take it with food. Take your medicine at regular intervals. Do not take your medicine more often than directed. Take all of your medicine as directed even if you think you are better. Do not skip doses or stop your medicine early. Talk to your pediatrician regarding the use of this medicine in children. While this drug may be prescribed for children as young as 14 days for selected conditions, precautions do apply. Overdosage: If you think you have taken too much of this medicine contact a poison control center or emergency room at once. NOTE: This medicine is only for you. Do not share this medicine with others. What if I miss a dose? If you miss a dose, take it as soon as you remember. If it is almost time for your next dose (within 2 hours), take only that dose. Do not take double or extra doses. What may interact with this medicine? Interactions are not expected. This list may not describe all possible interactions. Give your health care provider a list of all the medicines, herbs, non-prescription drugs, or dietary supplements you use. Also tell them if you smoke, drink alcohol, or use illegal drugs. Some items may interact with your medicine. What should I watch for while using this medicine? Visit your doctor or health care professional for regular check ups. Tell your doctor if your symptoms do not start to get better or if they get worse. If you have the flu,  you may be at an increased risk of developing seizures, confusion, or abnormal behavior. This occurs early in the illness, and more frequently in children and teens. These events are not common, but may result in accidental injury to the patient. Families and caregivers of patients should watch for signs of unusual behavior and contact a doctor or health care professional right away if the patient shows signs of unusual behavior. This medicine is not a substitute for the flu shot. Talk to your doctor each year about an annual flu shot. What side effects may I notice from receiving this medicine? Side effects that you should report to your doctor or health care professional as soon as possible: -allergic reactions like skin rash, itching or hives, swelling of the face, lips, or tongue -anxiety, confusion, unusual behavior -breathing problems -hallucination, loss of contact with reality -redness, blistering, peeling or loosening of the skin, including inside the mouth -seizures Side effects that usually do not require medical attention (report to your doctor or health care professional if they continue or are bothersome): -cough -diarrhea -dizziness -headache -nausea, vomiting -stomach pain This list may not describe all possible side effects. Call your doctor for medical advice about side effects. You may report side effects to FDA at 1-800-FDA-1088. Where should I keep my medicine? Keep out  of the reach of children. Store at room temperature between 15 and 30 degrees C (59 and 86 degrees F). Throw away any unused medicine after the expiration date. NOTE: This sheet is a summary. It may not cover all possible information. If you have questions about this medicine, talk to your doctor, pharmacist, or health care provider.  2015, [employer]. (2011-07-23 19:43:38)

## 2014-11-24 NOTE — ED Notes (Signed)
Pt states she has generalized weakness all over her body, aches, running nose, cough, since yesterday.  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

## 2014-11-24 NOTE — ED Notes (Signed)
Pt reports waking up yesterday with bodyaches, fever, n/v, headache and sore throat.

## 2015-03-21 ENCOUNTER — Inpatient Hospital Stay (HOSPITAL_COMMUNITY): Payer: Medicaid Other

## 2015-03-21 ENCOUNTER — Inpatient Hospital Stay (HOSPITAL_COMMUNITY)
Admission: AD | Admit: 2015-03-21 | Discharge: 2015-03-21 | Disposition: A | Discharge status: Home / Self Care | Payer: Medicaid Other | Source: Ambulatory Visit | Attending: Family Medicine | Admitting: Family Medicine

## 2015-03-21 ENCOUNTER — Ambulatory Visit: Payer: Medicaid Other | Attending: Internal Medicine | Admitting: Internal Medicine

## 2015-03-21 ENCOUNTER — Encounter: Payer: Self-pay | Admitting: Internal Medicine

## 2015-03-21 ENCOUNTER — Encounter (HOSPITAL_COMMUNITY): Payer: Self-pay

## 2015-03-21 VITALS — BP 113/76 | HR 80 | Temp 98.7°F | Resp 18 | Ht 66.0 in | Wt 218.0 lb

## 2015-03-21 DIAGNOSIS — O4691 Antepartum hemorrhage, unspecified, first trimester: Secondary | ICD-10-CM

## 2015-03-21 DIAGNOSIS — N926 Irregular menstruation, unspecified: Secondary | ICD-10-CM

## 2015-03-21 DIAGNOSIS — N8320 Unspecified ovarian cysts: Secondary | ICD-10-CM

## 2015-03-21 DIAGNOSIS — Z3201 Encounter for pregnancy test, result positive: Secondary | ICD-10-CM | POA: Diagnosis not present

## 2015-03-21 DIAGNOSIS — F17210 Nicotine dependence, cigarettes, uncomplicated: Secondary | ICD-10-CM | POA: Insufficient documentation

## 2015-03-21 DIAGNOSIS — O039 Complete or unspecified spontaneous abortion without complication: Secondary | ICD-10-CM | POA: Insufficient documentation

## 2015-03-21 DIAGNOSIS — O26899 Other specified pregnancy related conditions, unspecified trimester: Secondary | ICD-10-CM

## 2015-03-21 DIAGNOSIS — O3481 Maternal care for other abnormalities of pelvic organs, first trimester: Secondary | ICD-10-CM

## 2015-03-21 DIAGNOSIS — R109 Unspecified abdominal pain: Secondary | ICD-10-CM

## 2015-03-21 DIAGNOSIS — N939 Abnormal uterine and vaginal bleeding, unspecified: Secondary | ICD-10-CM | POA: Diagnosis not present

## 2015-03-21 DIAGNOSIS — O99331 Smoking (tobacco) complicating pregnancy, first trimester: Secondary | ICD-10-CM | POA: Diagnosis not present

## 2015-03-21 DIAGNOSIS — N83202 Unspecified ovarian cyst, left side: Secondary | ICD-10-CM

## 2015-03-21 DIAGNOSIS — Z3A01 Less than 8 weeks gestation of pregnancy: Secondary | ICD-10-CM | POA: Diagnosis not present

## 2015-03-21 DIAGNOSIS — O9989 Other specified diseases and conditions complicating pregnancy, childbirth and the puerperium: Secondary | ICD-10-CM | POA: Diagnosis not present

## 2015-03-21 DIAGNOSIS — O209 Hemorrhage in early pregnancy, unspecified: Secondary | ICD-10-CM | POA: Diagnosis present

## 2015-03-21 LAB — WET PREP, GENITAL
Trich, Wet Prep: NONE SEEN
YEAST WET PREP: NONE SEEN

## 2015-03-21 LAB — URINALYSIS, ROUTINE W REFLEX MICROSCOPIC
BILIRUBIN URINE: NEGATIVE
Glucose, UA: NEGATIVE mg/dL
Ketones, ur: NEGATIVE mg/dL
LEUKOCYTES UA: NEGATIVE
NITRITE: NEGATIVE
PH: 6.5 (ref 5.0–8.0)
Protein, ur: NEGATIVE mg/dL
SPECIFIC GRAVITY, URINE: 1.025 (ref 1.005–1.030)
Urobilinogen, UA: 0.2 mg/dL (ref 0.0–1.0)

## 2015-03-21 LAB — URINE MICROSCOPIC-ADD ON

## 2015-03-21 LAB — HCG, QUANTITATIVE, PREGNANCY: hCG, Beta Chain, Quant, S: 379 m[IU]/mL — ABNORMAL HIGH (ref <5)

## 2015-03-21 LAB — CBC
HEMATOCRIT: 31.7 % — AB (ref 36.0–46.0)
HEMOGLOBIN: 10.3 g/dL — AB (ref 12.0–15.0)
MCH: 27.2 pg (ref 26.0–34.0)
MCHC: 32.5 g/dL (ref 30.0–36.0)
MCV: 83.9 fL (ref 78.0–100.0)
Platelets: 306 10*3/uL (ref 150–400)
RBC: 3.78 MIL/uL — ABNORMAL LOW (ref 3.87–5.11)
RDW: 17.4 % — ABNORMAL HIGH (ref 11.5–15.5)
WBC: 8.4 10*3/uL (ref 4.0–10.5)

## 2015-03-21 LAB — POCT URINE PREGNANCY: Preg Test, Ur: POSITIVE — AB

## 2015-03-21 LAB — ABO/RH: ABO/RH(D): O POS

## 2015-03-21 NOTE — MAU Note (Signed)
Patient present pregnant. Unsure of weeks gestation with vaginal bleeding. Patient reported for annual OBGYN appointment and pregnancy test was done; results positive. Patient states she is on a menstrual period.  Patient reports periods lasting 9-10 days.  Reports feeling sharp pain in lower abdomen and began bleeding the next morning; believed to be regular menstrual period. Denies vaginal discharge. Reports no pain.

## 2015-03-21 NOTE — Progress Notes (Signed)
Pt's here routine Pap. No pain reported. Pt reports not taking any meds regularly. Pt last reported was Oct 2014  In [place]. Pt last menses was June 2016. Pt states she not concern of being pregnant.

## 2015-03-21 NOTE — Discharge Instructions (Signed)
Pelvic Rest Pelvic rest is sometimes recommended for women when:   The placenta is partially or completely covering the opening of the cervix (placenta previa).  There is bleeding between the uterine wall and the amniotic sac in the first trimester (subchorionic hemorrhage).  The cervix begins to open without labor starting (incompetent cervix, cervical insufficiency).  The labor is too early (preterm labor). HOME CARE INSTRUCTIONS  Do not have sexual intercourse, stimulation, or an orgasm.  Do not use tampons, douche, or put anything in the vagina.  Do not lift anything over 10 pounds (4.5 kg).  Avoid strenuous activity or straining your pelvic muscles. SEEK MEDICAL CARE IF:  You have any vaginal bleeding during pregnancy. Treat this as a potential emergency.  You have cramping pain felt low in the stomach (stronger than menstrual cramps).  You notice vaginal discharge (watery, mucus, or bloody).  You have a low, dull backache.  There are regular contractions or uterine tightening. SEEK IMMEDIATE MEDICAL CARE IF: You have vaginal bleeding and have placenta previa.  Document Released: 11/13/2010 Document Revised: 10/11/2011 Document Reviewed: 11/13/2010 [facility] Patient Information 2015 [place], [place]. This information is not intended to replace advice given to you by your health care provider. Make sure you discuss any questions you have with your health care provider.   Ectopic Pregnancy  WE DO NOT KNOW THAT YOU HAVE THIS YET!   THESE ARE INSTRUCTIONS FOR WHAT TO WATCH FOR  An ectopic pregnancy is when the fertilized egg attaches (implants) outside the uterus. Most ectopic pregnancies occur in the fallopian tube. Rarely do ectopic pregnancies occur on the ovary, intestine, pelvis, or cervix. In an ectopic pregnancy, the fertilized egg does not have the ability to develop into a normal, healthy baby.  A ruptured ectopic pregnancy is one in which the fallopian tube gets  torn or bursts and results in internal bleeding. Often there is intense abdominal pain, and sometimes, vaginal bleeding. Having an ectopic pregnancy can be life threatening. If left untreated, this dangerous condition can lead to a blood transfusion, abdominal surgery, or even death. CAUSES  Damage to the fallopian tubes is the suspected cause in most ectopic pregnancies.  RISK FACTORS Depending on your circumstances, the risk of having an ectopic pregnancy will vary. The level of risk can be divided into three categories. High Risk  You have gone through infertility treatment.  You have had a previous ectopic pregnancy.  You have had previous tubal surgery.  You have had previous surgery to have the fallopian tubes tied (tubal ligation).  You have tubal problems or diseases.  You have been exposed to DES. DES is a medicine that was used until 1971 and had effects on babies whose mothers took the medicine.  You become pregnant while using an intrauterine device (IUD) for birth control. Moderate Risk  You have a history of infertility.  You have a history of a sexually transmitted infection (STI).  You have a history of pelvic inflammatory disease (PID).  You have scarring from endometriosis.  You have multiple sexual partners.  You smoke. Low Risk  You have had previous pelvic surgery.  You use vaginal douching.  You became sexually active before 35 years of age.   SIGNS AND SYMPTOMS  An ectopic pregnancy should be suspected in anyone who has missed a period and has abdominal pain or bleeding.  You may experience normal pregnancy symptoms, such as:  Nausea.  Tiredness.  Breast tenderness.  Other symptoms may include:  Pain with  intercourse.  Irregular vaginal bleeding or spotting.  Cramping or pain on one side or in the lower abdomen.  Fast heartbeat.  Passing out while having a bowel movement.  Symptoms of a ruptured ectopic pregnancy and internal  bleeding may include:  Sudden, severe pain in the abdomen and pelvis.  Dizziness or fainting.  Pain in the shoulder area.   DIAGNOSIS  Tests that may be performed include:  A pregnancy test.  An ultrasound test.  Testing the specific level of pregnancy hormone in the bloodstream.  Taking a sample of uterus tissue (dilation and curettage, D&C).  Surgery to perform a visual exam of the inside of the abdomen using a thin, lighted tube with a tiny camera on the end (laparoscope). TREATMENT  An injection of a medicine called methotrexate may be given. This medicine causes the pregnancy tissue to be absorbed. It is given if:  The diagnosis is made early.  The fallopian tube has not ruptured.  You are considered to be a good candidate for the medicine. Usually, pregnancy hormone blood levels are checked after methotrexate treatment. This is to be sure the medicine is effective. It may take 4-6 weeks for the pregnancy to be absorbed (though most pregnancies will be absorbed by 3 weeks). Surgical treatment may be needed. A laparoscope may be used to remove the pregnancy tissue. If severe internal bleeding occurs, a cut (incision) may be made in the lower abdomen (laparotomy), and the ectopic pregnancy is removed. This stops the bleeding. Part of the fallopian tube, or the whole tube, may be removed as well (salpingectomy). After surgery, pregnancy hormone tests may be done to be sure there is no pregnancy tissue left. You may receive a Rho (D) immune globulin shot if you are Rh negative and the father is Rh positive, or if you do not know the Rh type of the father. This is to prevent problems with any future pregnancy. SEEK IMMEDIATE MEDICAL CARE IF:  You have any symptoms of an ectopic pregnancy. This is a medical emergency. MAKE SURE YOU:  Understand these instructions.  Will watch your condition.  Will get help right away if you are not doing well or get worse. Document Released:  08/26/2004 Document Revised: 12/03/2013 Document Reviewed: 02/15/2013 [facility] Patient Information 2015 [place], [place]. This information is not intended to replace advice given to you by your health care provider. Make sure you discuss any questions you have with your health care provider. Vaginal Bleeding During Pregnancy, First Trimester A small amount of bleeding (spotting) from the vagina is relatively common in early pregnancy. It usually stops on its own. Various things may cause bleeding or spotting in early pregnancy. Some bleeding may be related to the pregnancy, and some may not. In most cases, the bleeding is normal and is not a problem. However, bleeding can also be a sign of something serious. Be sure to tell your health care provider about any vaginal bleeding right away. Some possible causes of vaginal bleeding during the first trimester include:  Infection or inflammation of the cervix.  Growths (polyps) on the cervix.  Miscarriage or threatened miscarriage.  Pregnancy tissue has developed outside of the uterus and in a fallopian tube (tubal pregnancy).  Tiny cysts have developed in the uterus instead of pregnancy tissue (molar pregnancy). HOME CARE INSTRUCTIONS  Watch your condition for any changes. The following actions may help to lessen any discomfort you are feeling:  Follow your health care provider's instructions for limiting your activity. If  your health care provider orders bed rest, you may need to stay in bed and only get up to use the bathroom. However, your health care provider may allow you to continue light activity.  If needed, make plans for someone to help with your regular activities and responsibilities while you are on bed rest.  Keep track of the number of pads you use each day, how often you change pads, and how soaked (saturated) they are. Write this down.  Do not use tampons. Do not douche.  Do not have sexual intercourse or orgasms until approved  by your health care provider.  If you pass any tissue from your vagina, save the tissue so you can show it to your health care provider.  Only take over-the-counter or prescription medicines as directed by your health care provider.  Do not take aspirin because it can make you bleed.  Keep all follow-up appointments as directed by your health care provider. SEEK MEDICAL CARE IF:  You have any vaginal bleeding during any part of your pregnancy.  You have cramps or labor pains.  You have a fever, not controlled by medicine. SEEK IMMEDIATE MEDICAL CARE IF:   You have severe cramps in your back or belly (abdomen).  You pass large clots or tissue from your vagina.  Your bleeding increases.  You feel light-headed or weak, or you have fainting episodes.  You have chills.  You are leaking fluid or have a gush of fluid from your vagina.  You pass out while having a bowel movement. MAKE SURE YOU:  Understand these instructions.  Will watch your condition.  Will get help right away if you are not doing well or get worse. Document Released: 04/28/2005 Document Revised: 07/24/2013 Document Reviewed: 03/26/2013 [facility] Patient Information 2015 [place], [place]. This information is not intended to replace advice given to you by your health care provider. Make sure you discuss any questions you have with your health care provider.

## 2015-03-21 NOTE — MAU Provider Note (Signed)
History     CSN: [id number]  Arrival date and time: 03/21/15 1219   First Provider Initiated Contact with Patient 03/21/15 1307      Chief Complaint  Patient presents with  . Vaginal Bleeding   This is a 34 y.o. female at [redacted]w[redacted]d who presents with c/o bleeding.  States had normal period on 02/10/15.  THen started bleeding about 10 days ago, at the expected time of her period. Had sharp pain that first day (10 days ago) but then no further pain after that. Recently had a SAB in December of 2015.  Has 5 living children. States all were term except one at 31 weeks who weighed 5 lbs.   Vaginal Bleeding The patient's primary symptoms include vaginal bleeding. The patient's pertinent negatives include no genital itching, genital lesions, genital odor or pelvic pain. This is a new problem. The current episode started 1 to 4 weeks ago. The problem occurs constantly. The problem has been unchanged. The patient is experiencing no pain. She is pregnant. Pertinent negatives include no abdominal pain, chills, constipation, diarrhea, fever, headaches, nausea or vomiting. The vaginal discharge was bloody. The vaginal bleeding is typical of menses. She has not been passing clots. She has not been passing tissue. Nothing aggravates the symptoms. She has tried nothing for the symptoms. She is sexually active. No, her partner does not have an STD.   RN Note: Patient present pregnant. Unsure of weeks gestation with vaginal bleeding. Patient reported for annual OBGYN appointment and pregnancy test was done; results positive. Patient states she is on a menstrual period. Patient reports periods lasting 9-10 days. Reports feeling sharp pain in lower abdomen and began bleeding the next morning; believed to be regular menstrual period. Denies vaginal discharge. Reports no pain.           Past Medical History  Diagnosis Date  . Anemia   . Pregnancy induced hypertension     2001    Past Surgical History   Procedure Laterality Date  . Cholecystectomy      Family History  Problem Relation Age of Onset  . Cancer Mother   . Cancer Maternal Aunt   . Cancer Maternal Grandfather     Social History  Substance Use Topics  . Smoking status: Current Every Day Smoker -- 0.50 packs/day for 7 years    Types: Cigarettes  . Smokeless tobacco: Current User  . Alcohol Use: Yes     Comment: social drinker    Allergies: No Known Allergies  Prescriptions prior to admission  Medication Sig Dispense Refill Last Dose  . guaiFENesin (ROBITUSSIN) 100 MG/5ML liquid Take 5-10 mLs (100-200 mg total) by mouth every 4 (four) hours as needed for cough. 60 mL 0 Taking   Medical, Surgical, Family and Social histories reviewed and are listed above.  Medications and allergies reviewed.   Review of Systems  Constitutional: Negative for fever, chills and malaise/fatigue.  Gastrointestinal: Negative for nausea, vomiting, abdominal pain, diarrhea and constipation.  Genitourinary: Positive for vaginal bleeding. Negative for pelvic pain.       Vaginal bleeding   Neurological: Negative for dizziness, weakness and headaches.  Other systems negative  Physical Exam   Blood pressure 142/76, pulse 74, temperature 98.2 F (36.8 C), temperature source Oral, resp. rate 18, height 5\' 6"  (1.676 m), weight 217 lb (98.431 kg), last menstrual period 02/10/2015, unknown if currently breastfeeding.  Physical Exam  Constitutional: She is oriented to person, place, and time. She appears well-developed and well-nourished. No  distress.  HENT:  Head: Normocephalic.  Cardiovascular: Normal rate and regular rhythm.   Respiratory: Effort normal. No respiratory distress.  GI: Soft. She exhibits no distension. There is no tenderness. There is no rebound and no guarding.  Genitourinary: Vaginal discharge (moderate dark blood, cervix closed, uterus and adnexae difficult to palpate secondary to habitus) found.  Musculoskeletal: Normal  range of motion.  Neurological: She is alert and oriented to person, place, and time.  Skin: Skin is warm and dry.  Psychiatric: She has a normal mood and affect.    MAU Course  Procedures  MDM This bleeding could represent a normal pregnancy with bleeding, spontaneous abortion or even an ectopic which can be life-threatening.   Cultures were done to rule out pelvic infection Blood drawn for Quant HCG, CBC, ABO/Rh Results for orders placed or performed during the hospital encounter of 03/21/15 (from the past 24 hour(s))  Urinalysis, Routine w reflex microscopic (not at [facility])     Status: Abnormal   Collection Time: 03/21/15 12:38 PM  Result Value Ref Range   Color, Urine YELLOW YELLOW   APPearance CLEAR CLEAR   Specific Gravity, Urine 1.025 1.005 - 1.030   pH 6.5 5.0 - 8.0   Glucose, UA NEGATIVE NEGATIVE mg/dL   Hgb urine dipstick LARGE (A) NEGATIVE   Bilirubin Urine NEGATIVE NEGATIVE   Ketones, ur NEGATIVE NEGATIVE mg/dL   Protein, ur NEGATIVE NEGATIVE mg/dL   Urobilinogen, UA 0.2 0.0 - 1.0 mg/dL   Nitrite NEGATIVE NEGATIVE   Leukocytes, UA NEGATIVE NEGATIVE  Urine microscopic-add on     Status: Abnormal   Collection Time: 03/21/15 12:38 PM  Result Value Ref Range   Squamous Epithelial / LPF FEW (A) RARE   WBC, UA 0-2 <3 WBC/hpf   RBC / HPF 0-2 <3 RBC/hpf   Urine-Other MUCOUS PRESENT   Wet prep, genital     Status: Abnormal   Collection Time: 03/21/15  1:10 PM  Result Value Ref Range   Yeast Wet Prep HPF POC NONE SEEN NONE SEEN   Trich, Wet Prep NONE SEEN NONE SEEN   Clue Cells Wet Prep HPF POC FEW (A) NONE SEEN   WBC, Wet Prep HPF POC FEW (A) NONE SEEN  CBC     Status: Abnormal   Collection Time: 03/21/15  1:20 PM  Result Value Ref Range   WBC 8.4 4.0 - 10.5 K/uL   RBC 3.78 (L) 3.87 - 5.11 MIL/uL   Hemoglobin 10.3 (L) 12.0 - 15.0 g/dL   HCT 31.7 (L) 36.0 - 46.0 %   MCV 83.9 78.0 - 100.0 fL   MCH 27.2 26.0 - 34.0 pg   MCHC 32.5 30.0 - 36.0 g/dL   RDW 17.4 (H) 11.5  - 15.5 %   Platelets 306 150 - 400 K/uL  ABO/Rh     Status: None   Collection Time: 03/21/15  1:20 PM  Result Value Ref Range   ABO/RH(D) O POS   hCG, quantitative, pregnancy     Status: Abnormal   Collection Time: 03/21/15  1:20 PM  Result Value Ref Range   hCG, Beta Chain, Quant, S 379 (H) <5 mIU/mL   US Ob Comp Less 14 Wks  03/21/2015   CLINICAL DATA:  Bleeding.  EXAM: OBSTETRIC <14 WK [place] AND TRANSVAGINAL OB US  TECHNIQUE: Both transabdominal and transvaginal ultrasound examinations were performed for complete evaluation of the gestation as well as the maternal uterus, adnexal regions, and pelvic cul-de-sac. Transvaginal technique was performed to assess  early pregnancy.  COMPARISON:  None.  FINDINGS: Intrauterine gestational sac: None visualized  Yolk sac:  Not visualized  Embryo:  Not visualized  Maternal uterus/adnexae: Medially abutting the left ovary is a 1.6 x 1.5 x 2.2 cm hypoechoic area, difficult to completely connects to the left ovary. This could represent a corpus luteum cyst. It is difficult at this time to completely exclude ectopic pregnancy. Small amount of free fluid.  IMPRESSION: Hypoechoic area immediately adjacent to the left ovary which could reflect corpus luteum cyst, but difficult to completely exclude ectopic pregnancy. Recommend close clinical follow-up with serial quantitative HCG use and ultrasounds as clinically indicated.   Electronically Signed   By: Rolm Baptise M.D.   On: 03/21/2015 13:57   US Ob Transvaginal  03/21/2015   CLINICAL DATA:  Bleeding.  EXAM: OBSTETRIC <14 WK [place] AND TRANSVAGINAL OB US  TECHNIQUE: Both transabdominal and transvaginal ultrasound examinations were performed for complete evaluation of the gestation as well as the maternal uterus, adnexal regions, and pelvic cul-de-sac. Transvaginal technique was performed to assess early pregnancy.  COMPARISON:  None.  FINDINGS: Intrauterine gestational sac: None visualized  Yolk sac:  Not visualized   Embryo:  Not visualized  Maternal uterus/adnexae: Medially abutting the left ovary is a 1.6 x 1.5 x 2.2 cm hypoechoic area, difficult to completely connects to the left ovary. This could represent a corpus luteum cyst. It is difficult at this time to completely exclude ectopic pregnancy. Small amount of free fluid.  IMPRESSION: Hypoechoic area immediately adjacent to the left ovary which could reflect corpus luteum cyst, but difficult to completely exclude ectopic pregnancy. Recommend close clinical follow-up with serial quantitative HCG use and ultrasounds as clinically indicated.   Electronically Signed   By: Rolm Baptise M.D.   On: 03/21/2015 13:57     Assessment and Plan  A:  Pregnancy at [redacted]w[redacted]d by LMP       No gestational sac seen in uterus with Quant HCG of 379       Hypoechoic area adjacent to left ovary      Cannot rule out ectopic pregnancy  P:   Discussed findings with patient        Strict ectopic precautions reviewed in detail        Return to MAU Sunday for repeat Quant HCG        Repeat US in 7-10 days depending on results  [facility] 03/21/2015, 1:20 PM

## 2015-03-21 NOTE — Progress Notes (Signed)
Patient ID: Denise Moses, female   DOB: 03-13-1981, 34 y.o.   MRN: [id number]  CC: physical   HPI: Denise Moses is a 34 y.o. female here today for a follow up visit.  Patient has past medical history of anemia and recent miscarriage. Patient reports that her LMP was June 2016. She states that 2 weeks ago she noticed severe abdominal pain and heavy vaginal bleeding. Today she reports that she is still having vaginal bleeding but no pain. Of note patient reports that she had a miscarriage recently. She is requesting a physical exam.    No Known Allergies Past Medical History  Diagnosis Date  . Anemia   . Pregnancy induced hypertension     2001   Current Outpatient Prescriptions on File Prior to Visit  Medication Sig Dispense Refill  . guaiFENesin (ROBITUSSIN) 100 MG/5ML liquid Take 5-10 mLs (100-200 mg total) by mouth every 4 (four) hours as needed for cough. 60 mL 0   No current facility-administered medications on file prior to visit.   Family History  Problem Relation Age of Onset  . Cancer Mother   . Cancer Maternal Aunt   . Cancer Maternal Grandfather    Social History   Social History  . Marital Status: Single    Spouse Name: N/A  . Number of Children: N/A  . Years of Education: N/A   Occupational History  . Not on file.   Social History Main Topics  . Smoking status: Current Every Day Smoker -- 0.25 packs/day    Types: Cigarettes  . Smokeless tobacco: Not on file  . Alcohol Use: Yes     Comment: social drinker  . Drug Use: Yes    Special: Marijuana  . Sexual Activity: Yes    Birth Control/ Protection: None   Other Topics Concern  . Not on file   Social History Narrative    Review of Systems  Constitutional: Negative for fever, chills and malaise/fatigue.  Gastrointestinal: Positive for abdominal pain. Negative for nausea and vomiting.  Genitourinary: Negative.        Vaginal bleeding   Neurological: Negative for weakness.  All other systems  reviewed and are negative.  Objective:   Filed Vitals:   03/21/15 1118  BP: 113/76  Pulse: 80  Temp: 98.7 F (37.1 C)  Resp: 18    Physical Exam  Constitutional: She is oriented to person, place, and time.  Cardiovascular: Normal rate, regular rhythm and normal heart sounds.   Pulmonary/Chest: Effort normal and breath sounds normal.  Neurological: She is alert and oriented to person, place, and time.  Skin: Skin is warm and dry.     Lab Results  Component Value Date   WBC 7.9 07/03/2014   HGB 11.2* 07/03/2014   HCT 32.7* 07/03/2014   MCV 85.2 07/03/2014   PLT 224 07/03/2014   Lab Results  Component Value Date   CREATININE 0.60 04/21/2014   BUN 8 04/21/2014   NA 138 04/21/2014   K 3.9 04/21/2014   CL 105 04/21/2014   CO2 21 02/24/2014    No results found for: HGBA1C Lipid Panel  No results found for: CHOL, TRIG, HDL, CHOLHDL, VLDL, LDLCALC     Assessment and plan:   Denise Moses was seen today for gynecologic exam.  Diagnoses and all orders for this visit:  Missed period -     POCT urine pregnancy---positive   Pregnancy confirmed by positive urine test LMP was June. Unable to calculate a EDD.   Vaginal  bleeding/Abdominal pain in pregnancy I have deferred exam today and sent patient over to [facility]. Due to history and patients recent miscarriage I am sending her for evaluation. I have explained severity of condition and possible consequences of not seeking care now.   Return if symptoms worsen or fail to improve.       Lance Bosch, [place] [facility] [phone] 03/21/2015, 11:44 AM

## 2015-03-22 LAB — HIV ANTIBODY (ROUTINE TESTING W REFLEX): HIV Screen 4th Generation wRfx: NONREACTIVE

## 2015-03-23 ENCOUNTER — Inpatient Hospital Stay (HOSPITAL_COMMUNITY)
Admission: AD | Admit: 2015-03-23 | Discharge: 2015-03-23 | Disposition: A | Discharge status: Home / Self Care | Payer: Medicaid Other | Source: Ambulatory Visit | Attending: Obstetrics & Gynecology | Admitting: Obstetrics & Gynecology

## 2015-03-23 DIAGNOSIS — O209 Hemorrhage in early pregnancy, unspecified: Secondary | ICD-10-CM | POA: Diagnosis present

## 2015-03-23 DIAGNOSIS — Z3A01 Less than 8 weeks gestation of pregnancy: Secondary | ICD-10-CM | POA: Insufficient documentation

## 2015-03-23 DIAGNOSIS — O0281 Inappropriate change in quantitative human chorionic gonadotropin (hCG) in early pregnancy: Secondary | ICD-10-CM

## 2015-03-23 DIAGNOSIS — O200 Threatened abortion: Secondary | ICD-10-CM | POA: Diagnosis not present

## 2015-03-23 DIAGNOSIS — O99331 Smoking (tobacco) complicating pregnancy, first trimester: Secondary | ICD-10-CM | POA: Insufficient documentation

## 2015-03-23 DIAGNOSIS — F17210 Nicotine dependence, cigarettes, uncomplicated: Secondary | ICD-10-CM | POA: Diagnosis not present

## 2015-03-23 DIAGNOSIS — O280 Abnormal hematological finding on antenatal screening of mother: Secondary | ICD-10-CM | POA: Diagnosis not present

## 2015-03-23 LAB — HCG, QUANTITATIVE, PREGNANCY: HCG, BETA CHAIN, QUANT, S: 244 m[IU]/mL — AB (ref <5)

## 2015-03-23 NOTE — Discharge Instructions (Signed)

## 2015-03-23 NOTE — MAU Provider Note (Signed)
  History     CSN: [id number]  Arrival date and time: 03/23/15 1315   None     Chief Complaint  Patient presents with  . Labwork    HPI  Denise Moses is 34 y.o. [phone] [redacted]w[redacted]d weeks presents for repeat BHCG.  She was seen 03/21/15 for vaginal bleeding in early pregnancy.  BHCG on that visit was 379, U/S showed hypoechogic on left thought to represent Duck but ectopic could not be ruled out.  Blood type is O+.  Hx of SAB 07/2014.  She states she is having "only 3-4 drops" of blood today.  Neg for abdominal/pelvic pain.  Past Medical History  Diagnosis Date  . Anemia   . Pregnancy induced hypertension     2001    Past Surgical History  Procedure Laterality Date  . Cholecystectomy      Family History  Problem Relation Age of Onset  . Cancer Mother   . Cancer Maternal Aunt   . Cancer Maternal Grandfather     Social History  Substance Use Topics  . Smoking status: Current Every Day Smoker -- 0.50 packs/day for 7 years    Types: Cigarettes  . Smokeless tobacco: Current User  . Alcohol Use: Yes     Comment: social drinker    Allergies: No Known Allergies  Prescriptions prior to admission  Medication Sig Dispense Refill Last Dose  . guaiFENesin (ROBITUSSIN) 100 MG/5ML liquid Take 5-10 mLs (100-200 mg total) by mouth every 4 (four) hours as needed for cough. (Patient not taking: Reported on 03/21/2015) 60 mL 0 Taking    Review of Systems  Constitutional: Negative for fever and chills.  Respiratory: Negative for shortness of breath.   Gastrointestinal: Negative for abdominal pain.  Genitourinary:       Occasional spotting-light.  Neurological: Negative for headaches.   Physical Exam   Blood pressure 123/61, pulse 83, temperature 98.1 F (36.7 C), temperature source Oral, resp. rate 18, height 5\' 6"  (1.676 m), weight 217 lb (98.431 kg), last menstrual period 02/10/2015, unknown if currently breastfeeding.  Physical Exam  Constitutional: She is oriented to  person, place, and time. She appears well-developed and well-nourished. No distress.  HENT:  Head: Normocephalic.  Neurological: She is alert and oriented to person, place, and time.  Psychiatric: She has a normal mood and affect. Her behavior is normal. Thought content normal.   Results for orders placed or performed during the hospital encounter of 03/23/15 (from the past 24 hour(s))  hCG, quantitative, pregnancy     Status: Abnormal   Collection Time: 03/23/15  1:35 PM  Result Value Ref Range   hCG, Beta Chain, Quant, S 244 (H) <5 mIU/mL   MAU Course  Procedures  MDM MSE Labs Consulted with Dr. Vira Agar follow up [facility] on Tuesday am in the clinic. Assessment and Plan  A;  Bleeding in first trimester pregnancy      Decreasing BHCG     Threatened spontaneous abortion  P:  Discussed labs and follow up plan with the patient.  She agrees to come to clinic on Tuesday for repeat lab  KEY,EVE M 03/23/2015, 2:03 PM

## 2015-03-23 NOTE — MAU Note (Signed)
Patient presents at [redacted] weeks gestation for repeat labwork. Denies pain, bleeding or discharge.

## 2015-03-24 LAB — GC/CHLAMYDIA PROBE AMP (~~LOC~~) NOT AT ARMC
Chlamydia: NEGATIVE
Neisseria Gonorrhea: NEGATIVE

## 2015-03-25 ENCOUNTER — Other Ambulatory Visit: Payer: Medicaid Other

## 2015-04-17 ENCOUNTER — Encounter: Payer: Medicaid Other | Admitting: Internal Medicine

## 2015-04-17 ENCOUNTER — Other Ambulatory Visit: Payer: Self-pay | Admitting: Internal Medicine

## 2015-04-17 ENCOUNTER — Telehealth: Payer: Self-pay | Admitting: Internal Medicine

## 2015-04-17 DIAGNOSIS — O039 Complete or unspecified spontaneous abortion without complication: Secondary | ICD-10-CM

## 2015-04-17 NOTE — Telephone Encounter (Signed)
I have called patient and explained that since she was last evaluated at the ER for threatened spontaneous abortion and never re-seen she needs to go to GYN as soon as possible for evaluation. She states that 3 days after last ER visit she felt like she had the actual miscarriage but did not feel she need re-evaluation. She is requesting a pap smear. I will defer pap and evaluation of dizziness to GYN.   Lance Bosch, NP 04/17/2015 8:41 AM

## 2015-04-21 ENCOUNTER — Encounter: Payer: Self-pay | Admitting: Medical

## 2015-04-28 ENCOUNTER — Encounter: Payer: Medicaid Other | Admitting: Medical

## 2015-05-19 ENCOUNTER — Encounter: Payer: Medicaid Other | Admitting: Obstetrics & Gynecology

## 2015-05-22 ENCOUNTER — Other Ambulatory Visit (HOSPITAL_COMMUNITY)
Admission: RE | Admit: 2015-05-22 | Discharge: 2015-05-22 | Disposition: A | Discharge status: Home / Self Care | Payer: Medicaid Other | Source: Ambulatory Visit | Attending: Certified Nurse Midwife | Admitting: Certified Nurse Midwife

## 2015-05-22 ENCOUNTER — Ambulatory Visit (INDEPENDENT_AMBULATORY_CARE_PROVIDER_SITE_OTHER): Payer: Medicaid Other | Admitting: Certified Nurse Midwife

## 2015-05-22 ENCOUNTER — Encounter: Payer: Self-pay | Admitting: Certified Nurse Midwife

## 2015-05-22 VITALS — BP 134/78 | HR 87 | Temp 98.8°F | Ht 65.0 in | Wt 209.9 lb

## 2015-05-22 DIAGNOSIS — Z0000 Encounter for general adult medical examination without abnormal findings: Secondary | ICD-10-CM | POA: Diagnosis not present

## 2015-05-22 DIAGNOSIS — Z01419 Encounter for gynecological examination (general) (routine) without abnormal findings: Secondary | ICD-10-CM | POA: Insufficient documentation

## 2015-05-22 DIAGNOSIS — Z1151 Encounter for screening for human papillomavirus (HPV): Secondary | ICD-10-CM | POA: Diagnosis present

## 2015-05-22 LAB — COMPREHENSIVE METABOLIC PANEL
ALT: 12 U/L (ref 6–29)
AST: 14 U/L (ref 10–30)
Albumin: 4.2 g/dL (ref 3.6–5.1)
Alkaline Phosphatase: 67 U/L (ref 33–115)
BUN: 8 mg/dL (ref 7–25)
CO2: 24 mmol/L (ref 20–31)
Calcium: 9.7 mg/dL (ref 8.6–10.2)
Chloride: 105 mmol/L (ref 98–110)
Creat: 0.74 mg/dL (ref 0.50–1.10)
Glucose, Bld: 80 mg/dL (ref 65–99)
Potassium: 4.3 mmol/L (ref 3.5–5.3)
Sodium: 136 mmol/L (ref 135–146)
Total Bilirubin: 0.4 mg/dL (ref 0.2–1.2)
Total Protein: 7.9 g/dL (ref 6.1–8.1)

## 2015-05-22 LAB — CBC
HCT: 35 % — ABNORMAL LOW (ref 36.0–46.0)
Hemoglobin: 11.3 g/dL — ABNORMAL LOW (ref 12.0–15.0)
MCH: 26 pg (ref 26.0–34.0)
MCHC: 32.3 g/dL (ref 30.0–36.0)
MCV: 80.6 fL (ref 78.0–100.0)
MPV: 11.5 fL (ref 8.6–12.4)
Platelets: 307 10*3/uL (ref 150–400)
RBC: 4.34 MIL/uL (ref 3.87–5.11)
RDW: 16 % — ABNORMAL HIGH (ref 11.5–15.5)
WBC: 9.3 10*3/uL (ref 4.0–10.5)

## 2015-05-22 MED ORDER — FLUCONAZOLE 150 MG PO TABS: 150.0000 mg | ORAL_TABLET | Freq: Once | ORAL | Status: DC

## 2015-05-22 NOTE — Patient Instructions (Signed)
Cutaneous Candidiasis °Cutaneous candidiasis is a condition in which there is an overgrowth of yeast (candida) on the skin. Yeast normally live on the skin, but in small enough numbers not to cause any symptoms. In certain cases, increased growth of the yeast may cause an actual yeast infection. This kind of infection usually occurs in areas of the skin that are constantly warm and moist, such as the armpits or the groin. Yeast is the most common cause of diaper rash in babies and in people who cannot control their bowel movements (incontinence). °CAUSES  °The fungus that most often causes cutaneous candidiasis is Candida albicans. Conditions that can increase the risk of getting a yeast infection of the skin include: °· Obesity. °· Pregnancy. °· Diabetes. °· Taking antibiotic medicine. °· Taking birth control pills. °· Taking steroid medicines. °· Thyroid disease. °· An iron or zinc deficiency. °· Problems with the immune system. °SYMPTOMS  °· Red, swollen area of the skin. °· Bumps on the skin. °· Itchiness. °DIAGNOSIS  °The diagnosis of cutaneous candidiasis is usually based on its appearance. Light scrapings of the skin may also be taken and viewed under a microscope to identify the presence of yeast. °TREATMENT  °Antifungal creams may be applied to the infected skin. In severe cases, oral medicines may be needed.  °HOME CARE INSTRUCTIONS  °· Keep your skin clean and dry. °· Maintain a healthy weight. °· If you have diabetes, keep your blood sugar under control. °SEEK IMMEDIATE MEDICAL CARE IF: °· Your rash continues to spread despite treatment. °· You have a fever, chills, or abdominal pain. °  °This information is not intended to replace advice given to you by your health care provider. Make sure you discuss any questions you have with your health care provider. °  °Document Released: 04/06/2011 Document Revised: 10/11/2011 Document Reviewed: 01/20/2015 °Elsevier Interactive Patient Education ©2016 Elsevier  Inc. ° °

## 2015-05-22 NOTE — Progress Notes (Signed)
Patient ID: Jazzman Loughmiller, female   DOB: [birth year], 34 y.o.   MRN: [id number]    GYNECOLOGY CLINIC ANNUAL PREVENTATIVE CARE ENCOUNTER NOTE  Subjective:   Boluwatife Mutchler is a 34 y.o. [phone] female here for a routine annual gynecologic exam.  Current complaints: none.   Denies abnormal vaginal bleeding, discharge, pelvic pain, problems with intercourse or other gynecologic concerns.    Gynecologic History Patient's last menstrual period was 02/10/2015.  Obstetric History OB History  Gravida Para Term Preterm AB SAB TAB Ectopic Multiple Living  7 5 4 1 1 1  0   5    # Outcome Date GA Lbr Len/2nd Weight Sex Delivery Anes PTL Lv  7 Current           6 SAB 07/03/14 [redacted]w[redacted]d   U SAB   FD  5 Term 12/22/06 [redacted]w[redacted]d  6 lb 11 oz (3.033 kg) F Vag-Spont EPI  Y  4 Term 10/06/05 [redacted]w[redacted]d  8 lb 8 oz (3.856 kg) F Vag-Spont EPI  Y  3 Preterm 12/17/99 [redacted]w[redacted]d  5 lb 11 oz (2.58 kg) M Vag-Spont None Y Y  2 Term 11/03/98 [redacted]w[redacted]d  7 lb 14 oz (3.572 kg) M Vag-Spont EPI  Y  1 Term  [redacted]w[redacted]d   M    Y     Comments: System Generated. Please review and update pregnancy details.      Past Medical History  Diagnosis Date  . Anemia   . Pregnancy induced hypertension     2001    Past Surgical History  Procedure Laterality Date  . Cholecystectomy      No current outpatient prescriptions on file prior to visit.   No current facility-administered medications on file prior to visit.    No Known Allergies  Social History   Social History  . Marital Status: Single    Spouse Name: N/A  . Number of Children: N/A  . Years of Education: N/A   Occupational History  . Not on file.   Social History Main Topics  . Smoking status: Current Every Day Smoker -- 0.50 packs/day for 7 years    Types: Cigarettes  . Smokeless tobacco: Current User  . Alcohol Use: Yes     Comment: social drinker  . Drug Use: No  . Sexual Activity: Yes    Birth Control/ Protection: None   Other Topics Concern  . Not on file    Social History Narrative    Family History  Problem Relation Age of Onset  . Cancer Mother   . Cancer Maternal Aunt   . Cancer Maternal Grandfather     The following portions of the patient's history were reviewed and updated as appropriate: allergies, current medications, past family history, past medical history, past social history, past surgical history and problem list.  Review of Systems Pertinent items are noted in HPI.   Objective:  BP 134/78 mmHg  Pulse 87  Temp(Src) 98.8 F (37.1 C) (Oral)  Ht 5\' 5"  (1.651 m)  Wt 209 lb 14.4 oz (95.21 kg)  BMI 34.93 kg/m2  LMP 02/10/2015 CONSTITUTIONAL: Well-developed, well-nourished female in no acute distress.  HENT:  Normocephalic, atraumatic, External right and left ear normal. Oropharynx is clear and moist EYES: Conjunctivae and EOM are normal. Pupils are equal, round, and reactive to light. No scleral icterus.  NECK: Normal range of motion, supple, no masses.  Normal thyroid.  SKIN: Skin is warm and dry. No rash noted. Not diaphoretic. No erythema. No pallor. [place]:  Alert and oriented to person, place, and time. Normal reflexes, muscle tone coordination. No cranial nerve deficit noted. PSYCHIATRIC: Normal mood and affect. Normal behavior. Normal judgment and thought content. CARDIOVASCULAR: Normal heart rate noted, regular rhythm RESPIRATORY: Clear to auscultation bilaterally. Effort and breath sounds normal, no problems with respiration noted. BREASTS: Symmetric in size. No masses, skin changes, nipple drainage, or lymphadenopathy. ABDOMEN: Soft, normal bowel sounds, no distention noted.  No tenderness, rebound or guarding.  PELVIC: Normal appearing external genitalia; normal appearing vaginal mucosa and cervix.  No abnormal discharge noted.  Pap smear obtained.  Normal uterine size, no other palpable masses, no uterine or adnexal tenderness. MUSCULOSKELETAL: Normal range of motion. No tenderness.  No cyanosis, clubbing,  or edema.  2+ distal pulses.   Assessment:  Annual gynecologic examination with pap smear Yeast Vaginitis   Plan:  Will follow up results of pap smear and manage accordingly. CBC and CMP Routine preventative health maintenance measures emphasized. Please refer to After Visit Summary for other counseling recommendations.    [place] [facility] [employer]

## 2015-05-26 LAB — CYTOLOGY - PAP

## 2015-06-13 ENCOUNTER — Ambulatory Visit (INDEPENDENT_AMBULATORY_CARE_PROVIDER_SITE_OTHER): Payer: Medicaid Other | Admitting: Obstetrics & Gynecology

## 2015-06-13 ENCOUNTER — Encounter: Payer: Self-pay | Admitting: Obstetrics & Gynecology

## 2015-06-13 VITALS — BP 120/71 | HR 80 | Temp 98.6°F | Ht 66.0 in | Wt 209.1 lb

## 2015-06-13 DIAGNOSIS — Z3202 Encounter for pregnancy test, result negative: Secondary | ICD-10-CM

## 2015-06-13 DIAGNOSIS — Z309 Encounter for contraceptive management, unspecified: Secondary | ICD-10-CM

## 2015-06-13 DIAGNOSIS — Z30430 Encounter for insertion of intrauterine contraceptive device: Secondary | ICD-10-CM

## 2015-06-13 LAB — POCT URINE PREGNANCY: Preg Test, Ur: NEGATIVE

## 2015-06-13 NOTE — Patient Instructions (Signed)
Levonorgestrel intrauterine device (IUD) What is this medicine? LEVONORGESTREL IUD (LEE voe nor jes trel) is a contraceptive (birth control) device. The device is placed inside the uterus by a healthcare professional. It is used to prevent pregnancy and can also be used to treat heavy bleeding that occurs during your period. Depending on the device, it can be used for 3 to 5 years. This medicine may be used for other purposes; ask your health care provider or pharmacist if you have questions. What should I tell my health care provider before I take this medicine? They need to know if you have any of these conditions: -abnormal Pap smear -cancer of the breast, uterus, or cervix -diabetes -endometritis -genital or pelvic infection now or in the past -have more than one sexual partner or your partner has more than one partner -heart disease -history of an ectopic or tubal pregnancy -immune system problems -IUD in place -liver disease or tumor -problems with blood clots or take blood-thinners -use intravenous drugs -uterus of unusual shape -vaginal bleeding that has not been explained -an unusual or allergic reaction to levonorgestrel, other hormones, silicone, or polyethylene, medicines, foods, dyes, or preservatives -pregnant or trying to get pregnant -breast-feeding How should I use this medicine? This device is placed inside the uterus by a health care professional. Talk to your pediatrician regarding the use of this medicine in children. Special care may be needed. Overdosage: If you think you have taken too much of this medicine contact a poison control center or emergency room at once. NOTE: This medicine is only for you. Do not share this medicine with others. What if I miss a dose? This does not apply. What may interact with this medicine? Do not take this medicine with any of the following medications: -amprenavir -bosentan -fosamprenavir This medicine may also interact with  the following medications: -aprepitant -barbiturate medicines for inducing sleep or treating seizures -bexarotene -griseofulvin -medicines to treat seizures like carbamazepine, ethotoin, felbamate, oxcarbazepine, phenytoin, topiramate -modafinil -pioglitazone -rifabutin -rifampin -rifapentine -some medicines to treat HIV infection like atazanavir, indinavir, lopinavir, nelfinavir, tipranavir, ritonavir -St. John's wort -warfarin This list may not describe all possible interactions. Give your health care provider a list of all the medicines, herbs, non-prescription drugs, or dietary supplements you use. Also tell them if you smoke, drink alcohol, or use illegal drugs. Some items may interact with your medicine. What should I watch for while using this medicine? Visit your doctor or health care professional for regular check ups. See your doctor if you or your partner has sexual contact with others, becomes HIV positive, or gets a sexual transmitted disease. This product does not protect you against HIV infection (AIDS) or other sexually transmitted diseases. You can check the placement of the IUD yourself by reaching up to the top of your vagina with clean fingers to feel the threads. Do not pull on the threads. It is a good habit to check placement after each menstrual period. Call your doctor right away if you feel more of the IUD than just the threads or if you cannot feel the threads at all. The IUD may come out by itself. You may become pregnant if the device comes out. If you notice that the IUD has come out use a backup birth control method like condoms and call your health care provider. Using tampons will not change the position of the IUD and are okay to use during your period. What side effects may I notice from receiving this medicine?   Side effects that you should report to your doctor or health care professional as soon as possible: -allergic reactions like skin rash, itching or  hives, swelling of the face, lips, or tongue -fever, flu-like symptoms -genital sores -high blood pressure -no menstrual period for 6 weeks during use -pain, swelling, warmth in the leg -pelvic pain or tenderness -severe or sudden headache -signs of pregnancy -stomach cramping -sudden shortness of breath -trouble with balance, talking, or walking -unusual vaginal bleeding, discharge -yellowing of the eyes or skin Side effects that usually do not require medical attention (report to your doctor or health care professional if they continue or are bothersome): -acne -breast pain -change in sex drive or performance -changes in weight -cramping, dizziness, or faintness while the device is being inserted -headache -irregular menstrual bleeding within first 3 to 6 months of use -nausea This list may not describe all possible side effects. Call your doctor for medical advice about side effects. You may report side effects to FDA at 1-800-FDA-1088. Where should I keep my medicine? This does not apply. NOTE: This sheet is a summary. It may not cover all possible information. If you have questions about this medicine, talk to your doctor, pharmacist, or health care provider.    2016, Elsevier/Gold Standard. (2011-08-19 13:54:04)  

## 2015-06-13 NOTE — Progress Notes (Signed)
Patient ID: Denise Moses, female   DOB: [birth year], 34 y.o.   MRN: [id number] Patient's last menstrual period was 02/10/2015. 34 y.o. Returns today for IUD insertion and requests Liletta. She has not had intercourse since LMP or after miscarriage 03/2015   Patient identified, informed consent performed, signed copy in chart, time out was performed.  Urine pregnancy test negative.  Speculum placed in the vagina.  Cervix visualized.  Cleaned with Betadine x 2.  Grasped anteriourly with a single tooth tenaculum.  Uterus sounded to 9 cm.  Liletta IUD placed per manufacturer's recommendations.  Strings trimmed to 3 cm.   Patient given post procedure instructions and Liletta care card with expiration date.  Patient is asked to check IUD strings periodically and follow up in 4-6 weeks for IUD check.  Woodroe Mode, MD 06/13/2015

## 2015-07-11 ENCOUNTER — Ambulatory Visit: Payer: Medicaid Other | Admitting: Obstetrics & Gynecology

## 2015-10-10 ENCOUNTER — Ambulatory Visit: Payer: Medicaid Other | Admitting: Internal Medicine

## 2015-10-22 ENCOUNTER — Ambulatory Visit: Payer: Medicaid Other | Admitting: Internal Medicine

## 2015-12-23 ENCOUNTER — Encounter: Payer: Self-pay | Admitting: Family Medicine

## 2015-12-23 ENCOUNTER — Ambulatory Visit: Payer: Medicaid Other | Attending: Family Medicine | Admitting: Family Medicine

## 2015-12-23 VITALS — BP 126/76 | HR 83 | Temp 98.3°F | Resp 16 | Ht 66.0 in | Wt 202.0 lb

## 2015-12-23 DIAGNOSIS — L089 Local infection of the skin and subcutaneous tissue, unspecified: Secondary | ICD-10-CM | POA: Insufficient documentation

## 2015-12-23 DIAGNOSIS — F17210 Nicotine dependence, cigarettes, uncomplicated: Secondary | ICD-10-CM | POA: Insufficient documentation

## 2015-12-23 DIAGNOSIS — A4902 Methicillin resistant Staphylococcus aureus infection, unspecified site: Secondary | ICD-10-CM | POA: Diagnosis not present

## 2015-12-23 DIAGNOSIS — B9562 Methicillin resistant Staphylococcus aureus infection as the cause of diseases classified elsewhere: Secondary | ICD-10-CM | POA: Insufficient documentation

## 2015-12-23 MED ORDER — FLUCONAZOLE 150 MG PO TABS: 150.0000 mg | ORAL_TABLET | Freq: Once | ORAL | Status: DC

## 2015-12-23 MED ORDER — MUPIROCIN 2 % EX OINT: TOPICAL_OINTMENT | CUTANEOUS | Status: DC

## 2015-12-23 MED ORDER — SULFAMETHOXAZOLE-TRIMETHOPRIM 800-160 MG PO TABS: 1.0000 | ORAL_TABLET | Freq: Two times a day (BID) | ORAL | Status: DC

## 2015-12-23 NOTE — Progress Notes (Signed)
C/C bump on body  Bump painful with discharge Skin turn black spot after healing  Pain scale #0 Tobacco user 4 cigarette per day  No suicidal thoughts in the past two weeks

## 2015-12-23 NOTE — Assessment & Plan Note (Signed)
Will treat with bactrim followed by diflucan to prevent yeast Nasal mupirocin to eradicate MRSA nasal swab done

## 2015-12-23 NOTE — Progress Notes (Signed)
Subjective:  Patient ID: Denise Moses, female    DOB: [birth year]  Age: 35 y.o. MRN: [id number]  CC: Rash   HPI Denise Moses presents for    1. Rash: x 6-9 months. On legs. Starts with pustule with erythematous base then progress to hyperpigmented scars. She has one lesion on her R lower leg that seems to recur.  She has had them multiple times. They are painful. She has not been hospitalized in the past year. She has not been treated with antibiotics. No close contacts with rash.   Social History  Substance Use Topics  . Smoking status: Current Every Day Smoker -- 0.50 packs/day for 7 years    Types: Cigarettes  . Smokeless tobacco: Current User  . Alcohol Use: Yes     Comment: social drinker    Outpatient Prescriptions Prior to Visit  Medication Sig Dispense Refill  . fluconazole (DIFLUCAN) 150 MG tablet Take 1 tablet (150 mg total) by mouth once. (Patient not taking: Reported on 12/23/2015) 2 tablet 0   No facility-administered medications prior to visit.    ROS Review of Systems  Constitutional: Negative for fever and chills.  Eyes: Negative for visual disturbance.  Respiratory: Negative for shortness of breath.   Cardiovascular: Negative for chest pain.  Gastrointestinal: Negative for abdominal pain and blood in stool.  Musculoskeletal: Negative for back pain and arthralgias.  Skin: Positive for color change and rash.  Allergic/Immunologic: Negative for immunocompromised state.  Hematological: Negative for adenopathy. Does not bruise/bleed easily.  Psychiatric/Behavioral: Negative for suicidal ideas and dysphoric mood.    Objective:  BP 126/76 mmHg  Pulse 83  Temp(Src) 98.3 F (36.8 C) (Oral)  Resp 16  Ht 5\' 6"  (1.676 m)  Wt 202 lb (91.627 kg)  BMI 32.62 kg/m2  SpO2 100%  LMP 02/10/2015  BP/Weight 12/23/2015 06/13/2015 [id number]  Systolic BP 123XX123 [id number]  Diastolic BP 76 71 78  Wt. (Lbs) 202 209.1 209.9  BMI 32.62 33.77 34.93     Physical Exam  Constitutional: She is oriented to person, place, and time. She appears well-developed and well-nourished. No distress.  HENT:  Head: Normocephalic and atraumatic.  Pulmonary/Chest: Effort normal.  Musculoskeletal: She exhibits no edema.  Neurological: She is alert and oriented to person, place, and time.  Skin: Skin is warm and dry. Rash noted. Rash is pustular. There is erythema.     Psychiatric: She has a normal mood and affect.     Assessment & Plan:   There are no diagnoses linked to this encounter. Denise Moses was seen today for rash.  Diagnoses and all orders for this visit:  Infection of skin due to methicillin resistant Staphylococcus aureus (MRSA) -     mupirocin ointment (BACTROBAN) 2 %; Apply into both nostrils twice daily for 5 days -     sulfamethoxazole-trimethoprim (BACTRIM DS,SEPTRA DS) 800-160 MG tablet; Take 1 tablet by mouth 2 (two) times daily. -     Nasal culture -     fluconazole (DIFLUCAN) 150 MG tablet; Take 1 tablet (150 mg total) by mouth once. Repeat in 72 hrs of discharge or vaginal itching occurs   Meds ordered this encounter  Medications  . mupirocin ointment (BACTROBAN) 2 %    Sig: Apply into both nostrils twice daily for 5 days    Dispense:  22 g    Refill:  0  . sulfamethoxazole-trimethoprim (BACTRIM DS,SEPTRA DS) 800-160 MG tablet    Sig: Take 1 tablet by mouth 2 (two)  times daily.    Dispense:  20 tablet    Refill:  0  . fluconazole (DIFLUCAN) 150 MG tablet    Sig: Take 1 tablet (150 mg total) by mouth once. Repeat in 72 hrs of discharge or vaginal itching occurs    Dispense:  2 tablet    Refill:  0    Follow-up: No Follow-up on file.   Boykin Nearing MD

## 2015-12-23 NOTE — Patient Instructions (Addendum)
Denise Moses was seen today for rash.  Diagnoses and all orders for this visit:  Infection of skin due to methicillin resistant Staphylococcus aureus (MRSA) -     mupirocin ointment (BACTROBAN) 2 %; Apply into both nostrils twice daily for 5 days -     sulfamethoxazole-trimethoprim (BACTRIM DS,SEPTRA DS) 800-160 MG tablet; Take 1 tablet by mouth 2 (two) times daily. -     Nasal culture -     fluconazole (DIFLUCAN) 150 MG tablet; Take 1 tablet (150 mg total) by mouth once. Repeat in 72 hrs of discharge or vaginal itching occurs    Take bactrim to treat current MRSA skin infection Followed by mupirocin into both nostrils twice daily for 5 days  to eradicate MRSA Take diflucan after  Bactrim to prevent yeast   F/u in 6 weeks for recheck   Dr. Adrian Blackwater   Community-Associated MRSA CA-MRSA stands for community-associated methicillin-resistant Staphylococcus aureus. MRSA is a type of bacteria that is resistant to some common antibiotic medicines. It can cause infections in the skin and many other places in the body. Staphylococcus aureus, which is often called staph, is a bacterium that normally lives on the skin or in the nose of some people. Staph on the surface of the skin or in the nose does not cause problems. However, if the staph enters the body through a cut, wound, or break in the skin, an infection can happen. Until recently, infections with the MRSA type of staph occurred mainly in hospitals and other health care settings. Now there are increasing problems with MRSA infections in the community as well. Infections with MRSA may be very serious or even life-threatening. CAUSES  All staph bacteria, including MRSA:  Are normally harmless unless they enter the body through a scratch, cut, or wound, such as with surgery.  Can be spread from person to person by touching contaminated objects or through direct contact.  Cases of MRSA diseases in the community have been associated with:  Recent  antibiotic use.  Sharing contaminated towels or clothes.  Having active skin diseases.  Participating in contact sports.  Living in crowded settings.  IV drug use.  Community-associated MRSA infections are usually skin infections, but they may cause other severe illnesses, such as:  Pneumonia.  Bone or joint infections.  Bloodstream infections (sepsis). SYMPTOMS This condition usually starts with a skin infection. Signs and symptoms may vary and can include:  An infected area of skin that is red and swollen, feels painful, and is warm to the touch.  Pus under the skin or pus draining from the infected area.  Fever. DIAGNOSIS This condition is diagnosed by cultures of fluid samples that may come from:  Swabs that are taken from cuts or wounds in infected areas.  Nasal swabs.  Saliva or deep-cough specimens from the lungs (sputum).  Urine.  Blood. TREATMENT  Treatment varies and is based on how serious, how deep, or how extensive the infection is. For example:  Some skin infections, such as a small boil or abscess, may be treated by draining pus from the site of the infection.  Deeper or more widespread soft tissue infections are usually treated with surgery to drain pus and with antibiotics that are given through a vein or by mouth. This may be recommended even if you are pregnant.  Serious infections may require a hospital stay. If antibiotics are prescribed, they may be needed for several weeks. HOME CARE INSTRUCTIONS  Take your antibiotics as directed by your health  care provider. Finish the antibiotic even if you start to feel better.  Avoid close contact with people around you as much as possible. Do not use towels, razors, toothbrushes, bedding, or other items that will be used by others.  To fight the infection, follow your health care provider's instructions for wound care. Wash your hands before and after changing your bandages (dressings).  If you have  an intravascular device, such as a catheter or port, make sure that you know how to care for it. This might include:  Keeping the skin around the area clean.  Avoiding having your blood pressure taken on the side where the catheter is located.  Knowing when to report any suspected problems to your health care provider.  Be sure to tell any health care providers who care for you that you have MRSA so they are aware of your infection. PREVENTION  Wash your hands frequently with soap and water for at least 15 seconds. If soap and water are not available, use alcohol-based hand disinfectants.  Make sure that people who live with you wash their hands often, too.  Do not share personal items. For example, avoid sharing razors and other personal hygiene items, towels, clothing, and athletic equipment.  Wash and dry your clothes and bedding at the warmest temperatures that are recommended on the labels.  Keep wounds covered. Pus from infected sores may contain MRSA and other bacteria. Keep cuts and abrasions clean and covered with germ-free (sterile), dry bandages until they are healed.  If you have a wound that appears infected, ask your health care provider if a culture should be done for MRSA and other bacteria.  If you are breastfeeding, talk to your health care provider about MRSA. You may be asked to temporarily stop breastfeeding. SEEK IMMEDIATE MEDICAL CARE IF:  The infection appears to be getting worse. Signs include:  Increased warmth, redness, or tenderness around the wound site.  A red line that extends from the infection site.  A dark color in the area around the infection.  Wound drainage that is tan, yellow, or green.  A bad smell coming from the wound.  You feel nauseous, you vomit, or you cannot keep medicine down.  You have a fever.  You have difficulty breathing.   This information is not intended to replace advice given to you by your health care provider. Make  sure you discuss any questions you have with your health care provider.   Document Released: 10/22/2005 Document Revised: 08/09/2014 Document Reviewed: 10/22/2010 Elsevier Interactive Patient Education [employer].

## 2015-12-25 LAB — NASAL CULTURE (N/P): Organism ID, Bacteria: NORMAL

## 2015-12-30 ENCOUNTER — Telehealth: Payer: Self-pay | Admitting: *Deleted

## 2015-12-30 NOTE — Telephone Encounter (Signed)
LVM to return call.

## 2015-12-30 NOTE — Telephone Encounter (Signed)
-----   Message from Boykin Nearing, MD sent at 12/25/2015 11:22 AM EDT ----- Normal nasal culture, no MRSA colonization in nose

## 2016-04-12 IMAGING — MR MR ABDOMEN WO/W CM
16 of 24 series · 23 of 48 positions shown · IV contrast (multihance)
Comparison: Abdominal pelvic CT 02/24/2014. Pelvic ultrasound
02/24/2014.

CLINICAL DATA: 33-year-old with indeterminate splenic lesion and
sclerotic osseous lesions on abdominal CT performed recently for
abdominal pain. No history of malignancy.

EXAM:
MRI ABDOMEN AND PELVIS WITHOUT AND WITH CONTRAST
TECHNIQUE: Multiplanar multisequence MR imaging of the abdomen and pelvis was
performed both before and after the administration of intravenous
contrast.
CONTRAST:  20mL MULTIHANCE GADOBENATE DIMEGLUMINE 529 MG/ML IV SOLN

[Series 2: cor ssfse pelvis · coronal · 6.0mm · 0.78mm/px · 2 of 28 slices shown]
[im 1/28]
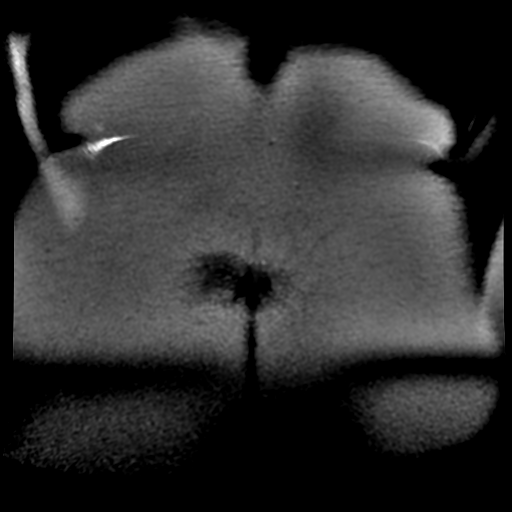
[im 28/28]
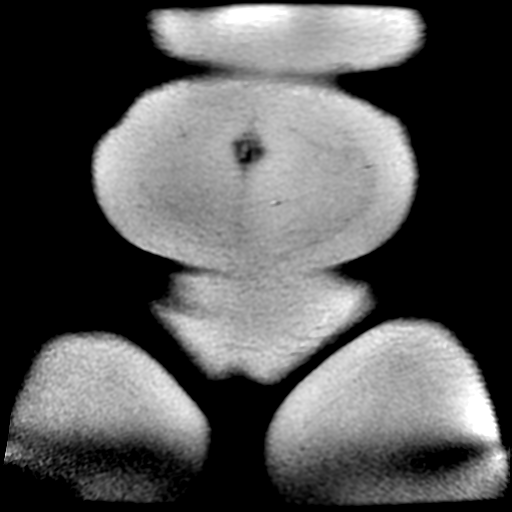

[Series 4: T2 fat-sat · axial · 5.0mm · 0.64mm/px · z∈[-234,-24]mm · 2 of 36 slices shown (1 of 2)]
[im 1/36]
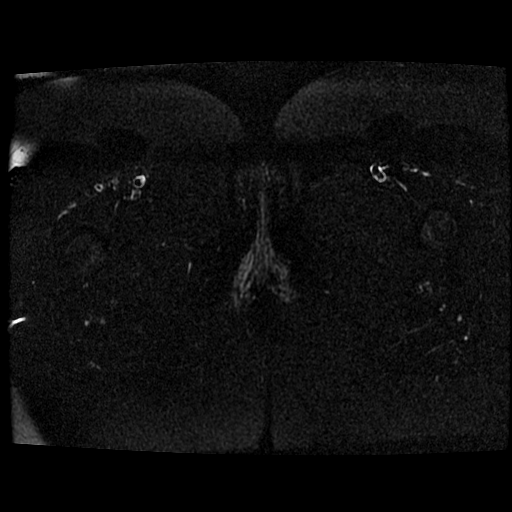
[im 36/36]
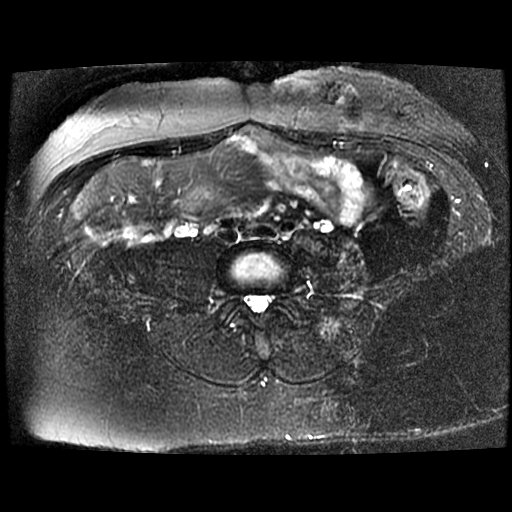

[Series 5: T2 · sagittal · 5.0mm · 0.55mm/px · 1 of 48 slices shown]
[im 1/48]
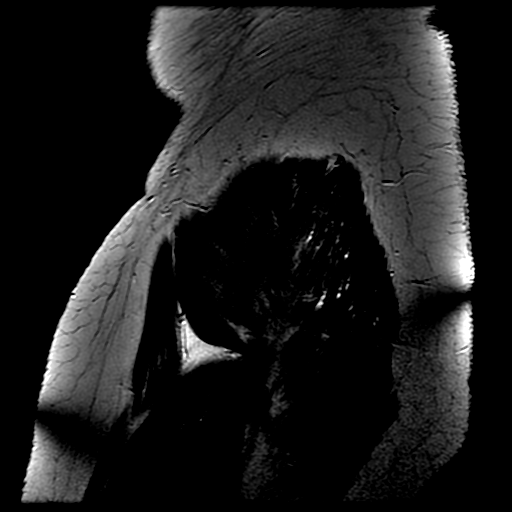

[Series 6: T1 · axial · 5.0mm · 0.64mm/px · 1 of 36 slices shown (1 of 3)]
[im 1/36]
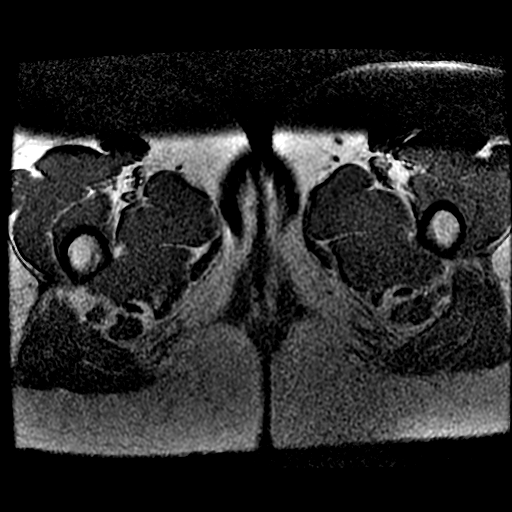

[Series 7: T1 · axial · 5.0mm · 1.29mm/px · 1 of 36 slices shown (2 of 3)]
[im 1/36]
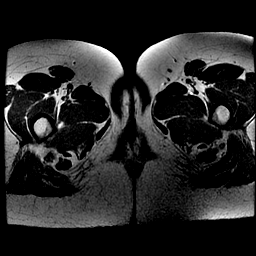

[Series 8: T1 fat-sat · axial · non-contrast · 5.0mm · 1.29mm/px · 1 of 36 slices shown]
[im 1/36]
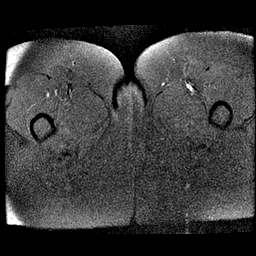

[Series 9: STIR · coronal · 6.0mm · 1.60mm/px · 1 of 28 slices shown]
[im 1/28]
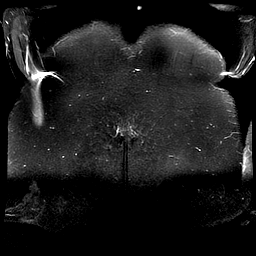

[Series 10: cor ssfse (abd) · coronal · 5.0mm · 1.56mm/px · 1 of 39 slices shown]
[im 1/39]
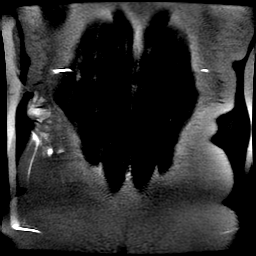

[Series 13: T2 fat-sat · axial · 6.0mm · 0.76mm/px · 1 of 31 slices shown (2 of 2)]
[im 1/31]
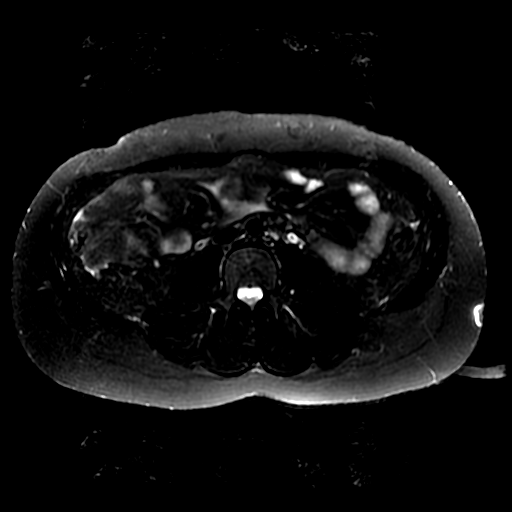

[Series 14: bSSFP · axial · 3.2mm · 0.78mm/px · z∈[+31,+258]mm · 2 of 55 slices shown]
[im 1/55]
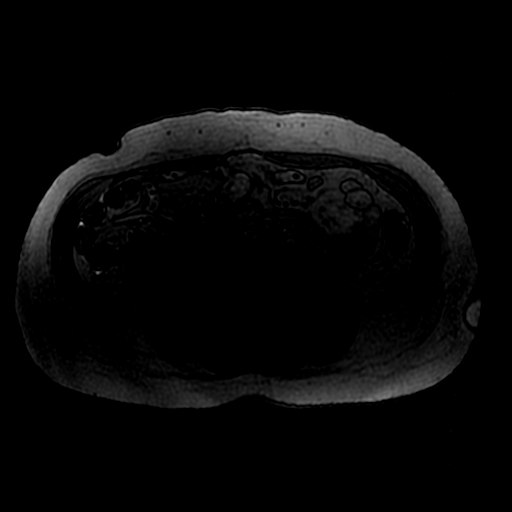
[im 55/55]
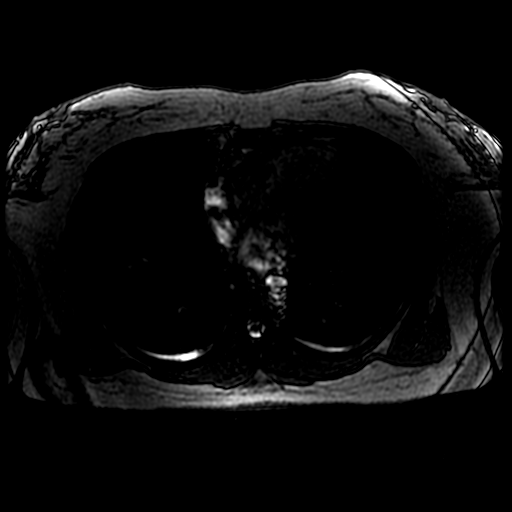

[Series 15: T1 · axial · 6.0mm · 0.76mm/px · z∈[+42,+252]mm · 2 of 62 slices shown (3 of 3)]
[im 1/62]
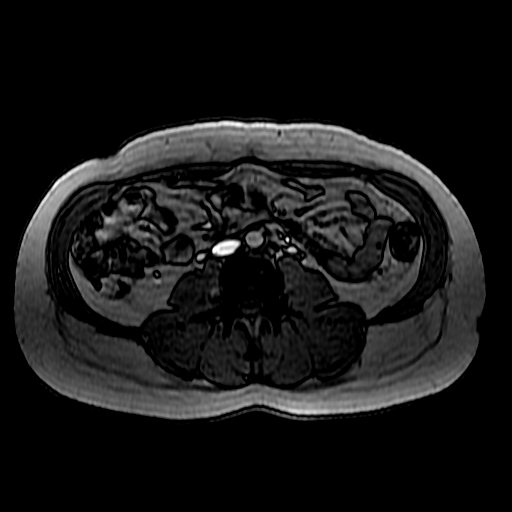
[im 62/62]
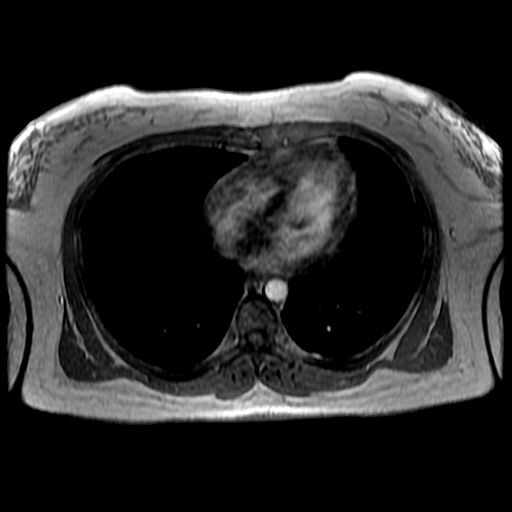

[Series 17: ax ssfse (abd) · axial · 6.0mm · 1.52mm/px · 1 of 31 slices shown]
[im 1/31]
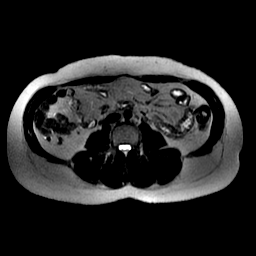

[Series 18: T1 dynamic · axial · 4.0mm · 0.78mm/px · z∈[+49,+254]mm · 3 of 104 slices shown (1 of 2)]
[im 1/104]
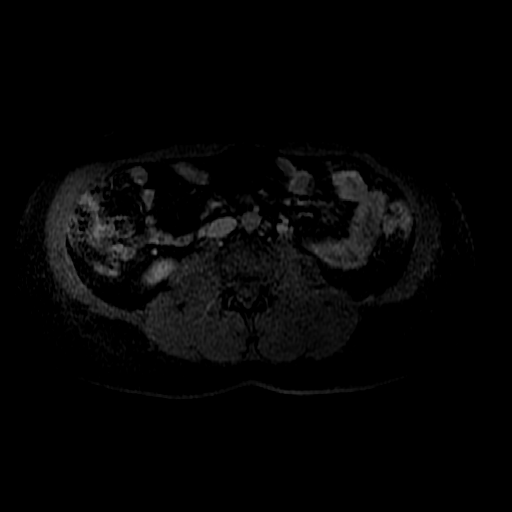
[im 52/104]
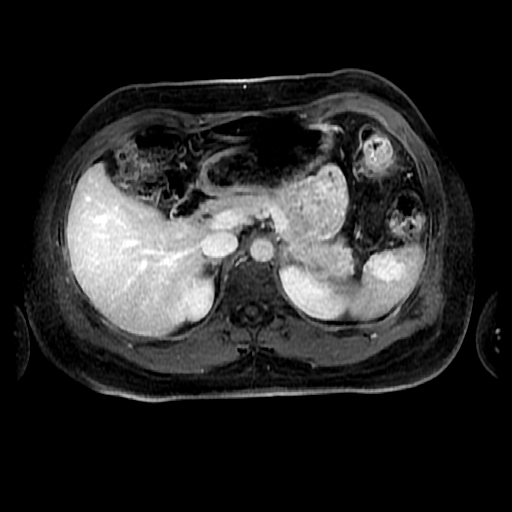
[im 104/104]
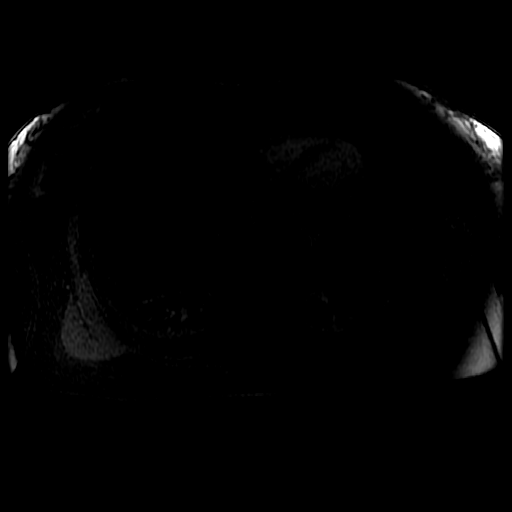

[Series 19: T1 fat-sat post-contrast · axial · 5.0mm · 1.29mm/px · 1 of 36 slices shown (1 of 2)]
[im 1/36]
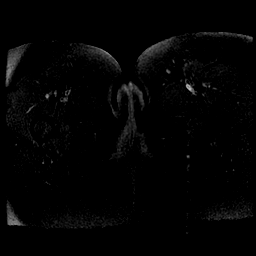

[Series 20: T1 fat-sat post-contrast · coronal · 6.0mm · 1.56mm/px · 1 of 28 slices shown (2 of 2)]
[im 1/28]
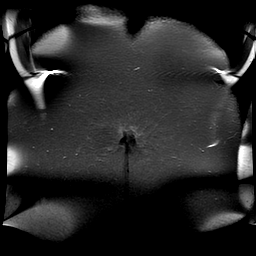

[Series 1600: T1 dynamic · axial · 4.0mm · 0.78mm/px · z∈[+49,+150]mm · 2 of 104 slices shown (2 of 2)]
[im 1/104]
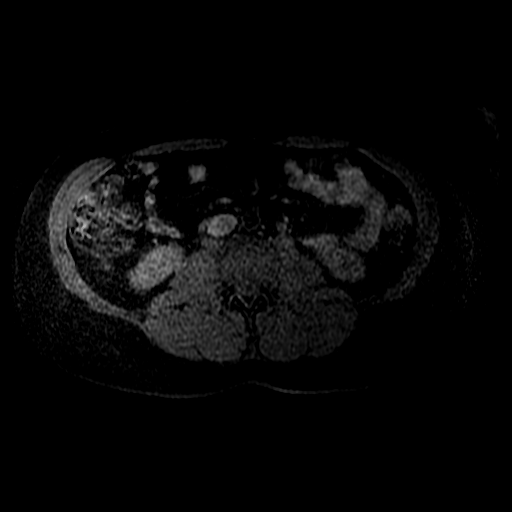
[im 52/104]
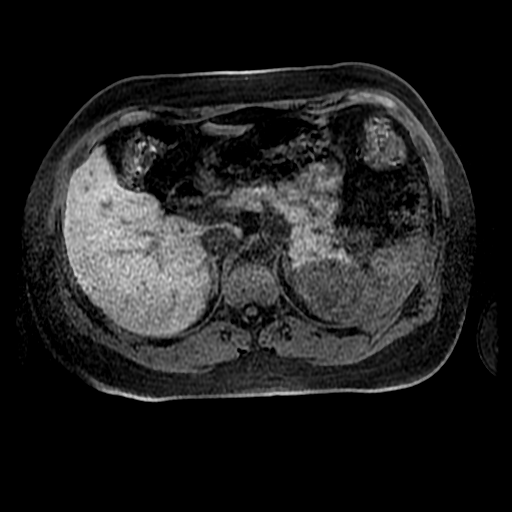

[23 of 48 positions shown; findings below may reference images not displayed]

FINDINGS: MRI ABDOMEN FINDINGS

Tiny hepatic cysts are noted, measuring 8 mm in the left lobe (image
5) and 5 mm in the right lobe (image 17) of series 13. The liver
otherwise appears unremarkable. There is no significant intra or
extrahepatic biliary dilatation status post cholecystectomy.

Corresponding with the lesion anteriorly in the spleen is a
lobulated 3.3 x 2.8 cm lesion which demonstrates mildly increased T1
signal and heterogeneously decreased T2 signal. This lesion enhances
following contrast and is homogeneous to blood pool on delayed phase
imaging. No other splenic lesions are identified. The spleen is
normal in size. There is no perisplenic fluid collection.

The adrenal glands, kidneys and pancreas appear normal. There is no
adenopathy or ascites. There is a probable small hemangioma within
the right aspect of the L2 vertebral body, best seen on the coronal
images. No suspicious osseous findings are demonstrated.

MRI PELVIS FINDINGS

The uterus, ovaries and bladder appear normal. There is no ureteral
dilatation, pelvic adenopathy or ascites.

The sclerotic lesions demonstrated within the iliac bones on recent
CT demonstrate low signal on all pulse sequences. There is no
surrounding T2 hyperintensity or abnormal enhancement. There is
asymmetric sclerosis surrounding the right sacroiliac joint. No
erosive changes are identified.
IMPRESSION: 1. The splenic lesion demonstrates homogeneous enhancement following
contrast. Although nonspecific, this is most likely an atypical
hemangioma.
2. There are small hepatic cysts. No acute abdominal findings
demonstrated.
3. The sclerotic lesions within the bony pelvis on CT demonstrate
low signal and no enhancement, most likely representing bone
islands.
4. Asymmetric sclerosis surrounding the right sacroiliac joint,
likely representing chronic reactive sclerosis. No erosive changes
identified.
5. The stability of these findings could be addressed with follow up
CT in 6-12 months as clinically warranted.

## 2016-04-13 ENCOUNTER — Encounter: Payer: Self-pay | Admitting: Family Medicine

## 2016-04-13 ENCOUNTER — Ambulatory Visit: Payer: Medicaid Other | Attending: Family Medicine | Admitting: Family Medicine

## 2016-04-13 VITALS — BP 111/73 | HR 74 | Temp 98.2°F | Ht 66.0 in | Wt 202.8 lb

## 2016-04-13 DIAGNOSIS — Z87891 Personal history of nicotine dependence: Secondary | ICD-10-CM | POA: Diagnosis not present

## 2016-04-13 DIAGNOSIS — R894 Abnormal immunological findings in specimens from other organs, systems and tissues: Secondary | ICD-10-CM

## 2016-04-13 DIAGNOSIS — L0293 Carbuncle, unspecified: Secondary | ICD-10-CM | POA: Diagnosis not present

## 2016-04-13 DIAGNOSIS — Z803 Family history of malignant neoplasm of breast: Secondary | ICD-10-CM | POA: Insufficient documentation

## 2016-04-13 MED ORDER — SULFAMETHOXAZOLE-TRIMETHOPRIM 800-160 MG PO TABS: 1.0000 | ORAL_TABLET | Freq: Two times a day (BID) | ORAL | 0 refills | Status: DC

## 2016-04-13 MED ORDER — FLUCONAZOLE 150 MG PO TABS: 150.0000 mg | ORAL_TABLET | Freq: Once | ORAL | 0 refills | Status: AC

## 2016-04-13 NOTE — Progress Notes (Signed)
Bumps on inner thigh for 2 weeks

## 2016-04-13 NOTE — Progress Notes (Signed)
Subjective:  Patient ID: Denise Moses, female    DOB: [birth year]  Age: 35 y.o. MRN: [id number]  CC: Mass (bump on inner thigh)   HPI Denise Moses presents for    1. Boils: x  One year. In groin area on L side. Has hx of similar boils under her b/l axilla that started 5 years ago but no longer recur. She has scarring in her axilla. She responded well to bactrim 4 montsh ago for boil on her leg.    2. Family history of breast cancer: mom and 2 maternal cancer. Mom at age 20. One Aunt age 61/49. One currently living age 48.   Social History  Substance Use Topics  . Smoking status: Current Every Day Smoker    Packs/day: 0.50    Years: 7.00    Types: Cigarettes  . Smokeless tobacco: Current User  . Alcohol use Yes     Comment: social drinker    Outpatient Medications Prior to Visit  Medication Sig Dispense Refill  . mupirocin ointment (BACTROBAN) 2 % Apply into both nostrils twice daily for 5 days 22 g 0  . fluconazole (DIFLUCAN) 150 MG tablet Take 1 tablet (150 mg total) by mouth once. Repeat in 72 hrs of discharge or vaginal itching occurs (Patient not taking: Reported on 04/13/2016) 2 tablet 0  . sulfamethoxazole-trimethoprim (BACTRIM DS,SEPTRA DS) 800-160 MG tablet Take 1 tablet by mouth 2 (two) times daily. (Patient not taking: Reported on 04/13/2016) 20 tablet 0   No facility-administered medications prior to visit.     ROS Review of Systems  Constitutional: Negative for chills and fever.  Eyes: Negative for visual disturbance.  Respiratory: Negative for shortness of breath.   Cardiovascular: Negative for chest pain.  Gastrointestinal: Negative for abdominal pain and blood in stool.  Musculoskeletal: Negative for arthralgias and back pain.  Skin: Positive for color change and rash.  Allergic/Immunologic: Negative for immunocompromised state.  Hematological: Negative for adenopathy. Does not bruise/bleed easily.  Psychiatric/Behavioral: Negative for  dysphoric mood and suicidal ideas.    Objective:  BP 111/73 (BP Location: Right Arm, Patient Position: Sitting, Cuff Size: Large)   Pulse 74   Temp 98.2 F (36.8 C) (Oral)   Ht 5\' 6"  (1.676 m)   Wt 202 lb 12.8 oz (92 kg)   SpO2 97%   BMI 32.73 kg/m   BP/Weight 04/13/2016 12/23/2015 [id number]  Systolic BP [id number]  Diastolic BP 73 76 71  Wt. (Lbs) 202.8 202 209.1  BMI 32.73 32.62 33.77    Physical Exam  Constitutional: She is oriented to person, place, and time. She appears well-developed and well-nourished. No distress.  HENT:  Head: Normocephalic and atraumatic.  Pulmonary/Chest: Effort normal.  Musculoskeletal: She exhibits no edema.  Neurological: She is alert and oriented to person, place, and time.  Skin: Skin is warm and dry. Rash noted. Rash is pustular. There is erythema.     Psychiatric: She has a normal mood and affect.     Assessment & Plan:   There are no diagnoses linked to this encounter. Denise Moses was seen today for mass.  Diagnoses and all orders for this visit:  Recurrent boils -     sulfamethoxazole-trimethoprim (BACTRIM DS,SEPTRA DS) 800-160 MG tablet; Take 1 tablet by mouth 2 (two) times daily. -     fluconazole (DIFLUCAN) 150 MG tablet; Take 1 tablet (150 mg total) by mouth once. Repeat in 72 hrs of discharge or vaginal itching occurs -  Ambulatory referral to Dermatology -     HSV(herpes smplx)abs-1+2(IgG+IgM)-bld  Family history of breast cancer in first degree relative   No orders of the defined types were placed in this encounter.   Follow-up: Return in about 3 months (around 07/13/2016) for boils .   Boykin Nearing MD

## 2016-04-13 NOTE — Assessment & Plan Note (Signed)
A: recurrent boils in L groin with hx of boils in axilla, MRSA nasal PCR negative. Suspect hidradenitis suppurativa  P: Course of bactrim Screen for HSV 1 and 2 Dermatology referral

## 2016-04-13 NOTE — Assessment & Plan Note (Signed)
Strong family hx of breast cancer  start mammograms at age 35 You will be called if genetic testing needed

## 2016-04-13 NOTE — Patient Instructions (Addendum)
Denise Moses was seen today for mass.  Diagnoses and all orders for this visit:  Recurrent boils -     sulfamethoxazole-trimethoprim (BACTRIM DS,SEPTRA DS) 800-160 MG tablet; Take 1 tablet by mouth 2 (two) times daily. -     fluconazole (DIFLUCAN) 150 MG tablet; Take 1 tablet (150 mg total) by mouth once. Repeat in 72 hrs of discharge or vaginal itching occurs -     Ambulatory referral to Dermatology  Family history of breast cancer in first degree relative  start mammograms at age 67 You will be called if genetic testing needed   F/u in   Dr. Adrian Blackwater  Hidradenitis Suppurativa Hidradenitis suppurativa is a long-term (chronic) skin disease that starts with blocked sweat glands or hair follicles. Bacteria may grow in these blocked openings of your skin. Hidradenitis suppurativa is like a severe form of acne that develops in areas of your body where acne would be unusual. It is most likely to affect the areas of your body where skin rubs against skin and becomes moist. This includes your:  Underarms.  Groin.  Genital areas.  Buttocks.  Upper thighs.  Breasts. Hidradenitis suppurativa may start out with small pimples. The pimples can develop into deep sores that break open (rupture) and drain pus. Over time your skin may thicken and become scarred. Hidradenitis suppurativa cannot be passed from person to person.  CAUSES  The exact cause of hidradenitis suppurativa is not known. This condition may be due to:  Female and female hormones. The condition is rare before and after puberty.  An overactive body defense system (immune system). Your immune system may overreact to the blocked hair follicles or sweat glands and cause swelling and pus-filled sores. RISK FACTORS You may have a higher risk of hidradenitis suppurativa if you:  Are a woman.  Are between ages 45 and 79.  Have a family history of hidradenitis suppurativa.  Have a personal history of acne.  Are  overweight.  Smoke.  Take the drug lithium. SIGNS AND SYMPTOMS  The first signs of an outbreak are usually painful skin bumps that look like pimples. As the condition progresses:  Skin bumps may get bigger and grow deeper into the skin.  Bumps under the skin may rupture and drain smelly pus.  Skin may become itchy and infected.  Skin may thicken and scar.  Drainage may continue through tunnels under the skin (fistulas).  Walking and moving your arms can become painful. DIAGNOSIS  Your health care provider may diagnose hidradenitis suppurativa based on your medical history and your signs and symptoms. A physical exam will also be done. You may need to see a health care provider who specializes in skin diseases (dermatologist). You may also have tests done to confirm the diagnosis. These can include:  Swabbing a sample of pus or drainage from your skin so it can be sent to the lab and tested for infection.  Blood tests to check for infection. TREATMENT  The same treatment will not work for everybody with hidradenitis suppurativa. Your treatment will depend on how severe your symptoms are. You may need to try several treatments to find what works best for you. Part of your treatment may include cleaning and bandaging (dressing) your wounds. You may also have to take medicines, such as the following:  Antibiotics.  Acne medicines.  Medicines to block or suppress the immune system.  A diabetes medicine (metformin) is sometimes used to treat this condition.  For women, birth control pills can sometimes  help relieve symptoms. You may need surgery if you have a severe case of hidradenitis suppurativa that does not respond to medicine. Surgery may involve:   Using a laser to clear the skin and remove hair follicles.  Opening and draining deep sores.  Removing the areas of skin that are diseased and scarred. HOME CARE INSTRUCTIONS  Learn as much as you can about your disease, and  work closely with your health care providers.  Take medicines only as directed by your health care provider.  If you were prescribed an antibiotic medicine, finish it all even if you start to feel better.  If you are overweight, losing weight may be very helpful. Try to reach and maintain a healthy weight.  Do not use any tobacco products, including cigarettes, chewing tobacco, or electronic cigarettes. If you need help quitting, ask your health care provider.  Do not shave the areas where you get hidradenitis suppurativa.  Do not wear deodorant.  Wear loose-fitting clothes.  Try not to overheat and get sweaty.  Take a daily bleach bath as directed by your health care provider.  Fill your bathtub halfway with water.  Pour in  cup of unscented household bleach.  Soak for 5-10 minutes.  Cover sore areas with a warm, clean washcloth (compress) for 5-10 minutes. SEEK MEDICAL CARE IF:   You have a flare-up of hidradenitis suppurativa.  You have chills or a fever.  You are having trouble controlling your symptoms at home.   This information is not intended to replace advice given to you by your health care provider. Make sure you discuss any questions you have with your health care provider.   Document Released: 03/02/2004 Document Revised: 08/09/2014 Document Reviewed: 10/19/2013 Elsevier Interactive Patient Education [employer].

## 2016-04-15 ENCOUNTER — Telehealth: Payer: Self-pay

## 2016-04-15 DIAGNOSIS — R894 Abnormal immunological findings in specimens from other organs, systems and tissues: Secondary | ICD-10-CM | POA: Insufficient documentation

## 2016-04-15 LAB — HSV(HERPES SMPLX)ABS-I+II(IGG+IGM)-BLD
HERPES SIMPLEX VRS I-IGM AB (EIA): 2.1 {index} — AB
HSV 1 Glycoprotein G Ab, IgG: 40.4 Index — ABNORMAL HIGH (ref <0.90)
HSV 2 GLYCOPROTEIN G AB, IGG: 9.38 {index} — AB (ref <0.90)

## 2016-04-15 MED ORDER — VALACYCLOVIR HCL 1 G PO TABS: 1000.0000 mg | ORAL_TABLET | Freq: Two times a day (BID) | ORAL | 0 refills | Status: DC

## 2016-04-15 NOTE — Telephone Encounter (Signed)
Patient hipaa verif per guidelines.  RN advised patient per Dr. 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   Patient verbalized understanding; and no further questions at this time.

## 2016-04-15 NOTE — Telephone Encounter (Signed)
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

## 2016-04-15 NOTE — Addendum Note (Signed)
Addended by: Boykin Nearing on: 04/15/2016 01:34 PM   Modules accepted: Orders

## 2016-06-07 ENCOUNTER — Emergency Department (HOSPITAL_COMMUNITY)
Admission: EM | Admit: 2016-06-07 | Discharge: 2016-06-08 | Disposition: A | Discharge status: Home / Self Care | Payer: Medicaid Other | Attending: Emergency Medicine | Admitting: Emergency Medicine

## 2016-06-07 ENCOUNTER — Encounter (HOSPITAL_COMMUNITY): Payer: Self-pay | Admitting: Emergency Medicine

## 2016-06-07 ENCOUNTER — Emergency Department (HOSPITAL_COMMUNITY): Payer: Medicaid Other

## 2016-06-07 DIAGNOSIS — O26891 Other specified pregnancy related conditions, first trimester: Secondary | ICD-10-CM | POA: Diagnosis present

## 2016-06-07 DIAGNOSIS — N938 Other specified abnormal uterine and vaginal bleeding: Secondary | ICD-10-CM | POA: Diagnosis not present

## 2016-06-07 DIAGNOSIS — F17210 Nicotine dependence, cigarettes, uncomplicated: Secondary | ICD-10-CM | POA: Diagnosis not present

## 2016-06-07 DIAGNOSIS — Z3A01 Less than 8 weeks gestation of pregnancy: Secondary | ICD-10-CM | POA: Diagnosis not present

## 2016-06-07 DIAGNOSIS — N939 Abnormal uterine and vaginal bleeding, unspecified: Secondary | ICD-10-CM

## 2016-06-07 DIAGNOSIS — O99331 Smoking (tobacco) complicating pregnancy, first trimester: Secondary | ICD-10-CM | POA: Diagnosis not present

## 2016-06-07 DIAGNOSIS — O208 Other hemorrhage in early pregnancy: Secondary | ICD-10-CM | POA: Diagnosis not present

## 2016-06-07 LAB — URINALYSIS, ROUTINE W REFLEX MICROSCOPIC
BILIRUBIN URINE: NEGATIVE
GLUCOSE, UA: NEGATIVE mg/dL
Hgb urine dipstick: NEGATIVE
KETONES UR: NEGATIVE mg/dL
NITRITE: NEGATIVE
PH: 7 (ref 5.0–8.0)
Protein, ur: NEGATIVE mg/dL
Specific Gravity, Urine: 1.02 (ref 1.005–1.030)

## 2016-06-07 LAB — CBC WITH DIFFERENTIAL/PLATELET
BASOS PCT: 1 %
Basophils Absolute: 0.1 10*3/uL (ref 0.0–0.1)
EOS ABS: 0.2 10*3/uL (ref 0.0–0.7)
EOS PCT: 3 %
HCT: 31 % — ABNORMAL LOW (ref 36.0–46.0)
Hemoglobin: 10.1 g/dL — ABNORMAL LOW (ref 12.0–15.0)
LYMPHS ABS: 2.3 10*3/uL (ref 0.7–4.0)
Lymphocytes Relative: 29 %
MCH: 27 pg (ref 26.0–34.0)
MCHC: 32.6 g/dL (ref 30.0–36.0)
MCV: 82.9 fL (ref 78.0–100.0)
Monocytes Absolute: 0.5 10*3/uL (ref 0.1–1.0)
Monocytes Relative: 6 %
NEUTROS PCT: 61 %
Neutro Abs: 5 10*3/uL (ref 1.7–7.7)
PLATELETS: 238 10*3/uL (ref 150–400)
RBC: 3.74 MIL/uL — AB (ref 3.87–5.11)
RDW: 17 % — ABNORMAL HIGH (ref 11.5–15.5)
WBC: 8.1 10*3/uL (ref 4.0–10.5)

## 2016-06-07 LAB — BASIC METABOLIC PANEL
Anion gap: 6 (ref 5–15)
BUN: 6 mg/dL (ref 6–20)
CO2: 22 mmol/L (ref 22–32)
CREATININE: 0.71 mg/dL (ref 0.44–1.00)
Calcium: 8.9 mg/dL (ref 8.9–10.3)
Chloride: 107 mmol/L (ref 101–111)
Glucose, Bld: 96 mg/dL (ref 65–99)
POTASSIUM: 3.2 mmol/L — AB (ref 3.5–5.1)
SODIUM: 135 mmol/L (ref 135–145)

## 2016-06-07 LAB — URINE MICROSCOPIC-ADD ON

## 2016-06-07 LAB — WET PREP, GENITAL
Sperm: NONE SEEN
Trich, Wet Prep: NONE SEEN
Yeast Wet Prep HPF POC: NONE SEEN

## 2016-06-07 LAB — I-STAT BETA HCG BLOOD, ED (MC, WL, AP ONLY): HCG, QUANTITATIVE: 1010.9 m[IU]/mL — AB (ref <5)

## 2016-06-07 MED ORDER — OXYCODONE-ACETAMINOPHEN 5-325 MG PO TABS
1.0000 | ORAL_TABLET | Freq: Once | ORAL | Status: AC
  Administered 2016-06-07: 1 via ORAL
  Filled 2016-06-07: qty 1

## 2016-06-07 NOTE — ED Triage Notes (Signed)
Abd pn since yesterday; intermittent; worse today, constant for 3 hrs; pt sts feels like "contraction pain," but pt is not pregnant; c/o belly and "booty" hurt per female companion on scene. Pt also c/o "maroon" when she wipes; no dysuria or other urinary symptoms.  No hx, no meds, no allergies reported to EMS.

## 2016-06-07 NOTE — ED Provider Notes (Signed)
[facility] DEPT Provider Note   CSN: [id number] Arrival date & time: 06/07/16  2105     History   Chief Complaint Chief Complaint  Patient presents with  . Abdominal Pain    HPI Brendalynn Danesi is a 35 y.o. female.  The history is provided by the patient and medical records.  Abdominal Pain      35 year old female with history of anemia, hypertension in pregnancy, presenting to the ED for abdominal pain. States she felt some mild pain yesterday evening, however this resolved. She states while fixing dinner tonight for her family she developed some lower abdominal cramping which progressively worsened. States eventually it got to the point where she can only get comfortable being curled up on her side, so her boyfriend called EMS. She denies any nausea, vomiting, or diarrhea. She's not had any fever or chills. Does report today that she urinated and denies any blood in her urine, but states there was some "maroon colored material" on the tissue. She denies any blood in the stools or with bowel movements. States she does have an IUD, reports that her menstrual cycles have been irregular, but she is due for it sometime soon. Denies any vaginal discharge or concern for STD. No prior abdominal surgeries.  Past Medical History:  Diagnosis Date  . Anemia   . Pregnancy induced hypertension    2001    Patient Active Problem List   Diagnosis Date Noted  . 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 Recurrent boils 04/13/2016  . Family history of breast cancer in first degree relative 04/13/2016  . Smoker 07/03/2014    Past Surgical History:  Procedure Laterality Date  . CHOLECYSTECTOMY      OB History    Gravida Para Term Preterm AB Living   7 5 4 1 2 5    SAB TAB Ectopic Multiple Live Births   2 0     5       Home Medications    Prior to Admission medications   Medication Sig Start Date End Date Taking? Authorizing Provider  mupirocin ointment (BACTROBAN) 2  % Apply into both nostrils twice daily for 5 days Patient not taking: Reported on 06/07/2016 12/23/15   Boykin Nearing, MD  sulfamethoxazole-trimethoprim (BACTRIM DS,SEPTRA DS) 800-160 MG tablet Take 1 tablet by mouth 2 (two) times daily. Patient not taking: Reported on 06/07/2016 04/13/16   Boykin Nearing, MD  valACYclovir (VALTREX) 1000 MG tablet Take 1 tablet (1,000 mg total) by mouth 2 (two) times daily. Patient not taking: Reported on 06/07/2016 04/15/16   Boykin Nearing, MD    Family History Family History  Problem Relation Age of Onset  . Breast cancer Mother 39    was a smoker, alcohol drinker in early life   . Breast cancer Maternal Aunt   . Cancer Maternal Grandfather   . Breast cancer Maternal Aunt     Social History Social History  Substance Use Topics  . Smoking status: Current Every Day Smoker    Packs/day: 0.50    Years: 7.00    Types: Cigarettes  . Smokeless tobacco: Current User     Comment: 2-3 cigs/day  . Alcohol use No     Comment: social drinker     Allergies   Patient has no known allergies.   Review of Systems Review of Systems  Gastrointestinal: Positive for abdominal pain.  All other systems reviewed and are negative.    Physical Exam Updated Vital Signs BP 147/100 (BP Location:  Left Arm)   Pulse 74   Temp 98.3 F (36.8 C) (Oral)   Resp 18   Ht 5\' 6"  (1.676 m)   Wt 86.6 kg   SpO2 100%   BMI 30.83 kg/m   Physical Exam  Constitutional: She is oriented to person, place, and time. She appears well-developed and well-nourished.  HENT:  Head: Normocephalic and atraumatic.  Mouth/Throat: Oropharynx is clear and moist.  Eyes: Conjunctivae and EOM are normal. Pupils are equal, round, and reactive to light.  Neck: Normal range of motion.  Cardiovascular: Normal rate, regular rhythm and normal heart sounds.   Pulmonary/Chest: Effort normal and breath sounds normal. No respiratory distress. She has no wheezes.  Abdominal: Soft. Bowel sounds  are normal. There is no tenderness. There is no rebound.  Points to suprapubic region as area of pain, nontender to palpation  Genitourinary:  Genitourinary Comments: Normal female external genitalia without visible lesions or rashes; moderate amount of blood noted in vaginal vault; cervix is not completely visualized, nor are IUD strings; no clots or membranous tissue noted  Musculoskeletal: Normal range of motion.  Neurological: She is alert and oriented to person, place, and time.  Skin: Skin is warm and dry.  Psychiatric: She has a normal mood and affect.  Nursing note and vitals reviewed.    ED Treatments / Results  Labs (all labs ordered are listed, but only abnormal results are displayed) Labs Reviewed  WET PREP, GENITAL - Abnormal; Notable for the following:       Result Value   Clue Cells Wet Prep HPF POC PRESENT (*)    WBC, Wet Prep HPF POC FEW (*)    All other components within normal limits  CBC WITH DIFFERENTIAL/PLATELET - Abnormal; Notable for the following:    RBC 3.74 (*)    Hemoglobin 10.1 (*)    HCT 31.0 (*)    RDW 17.0 (*)    All other components within normal limits  BASIC METABOLIC PANEL - Abnormal; Notable for the following:    Potassium 3.2 (*)    All other components within normal limits  URINALYSIS, ROUTINE W REFLEX MICROSCOPIC (NOT AT [facility]) - Abnormal; Notable for the following:    APPearance CLOUDY (*)    Leukocytes, UA SMALL (*)    All other components within normal limits  URINE MICROSCOPIC-ADD ON - Abnormal; Notable for the following:    Squamous Epithelial / LPF 6-30 (*)    Bacteria, UA FEW (*)    All other components within normal limits  I-STAT BETA HCG BLOOD, ED ([facility], [facility], [facility] ONLY) - Abnormal; Notable for the following:    I-stat hCG, quantitative 1,010.9 (*)    All other components within normal limits  HIV ANTIBODY (ROUTINE TESTING)  RPR  ABO/RH  GC/CHLAMYDIA PROBE AMP ([place]) NOT AT [facility]    EKG  EKG Interpretation None        Radiology US Ob Comp Less 14 Wks  Result Date: 06/08/2016 CLINICAL DATA:  Vaginal bleeding and pain for 1 day.  Pregnant. EXAM: OBSTETRIC <14 WK [place] AND TRANSVAGINAL OB US TECHNIQUE: Both transabdominal and transvaginal ultrasound examinations were performed for complete evaluation of the gestation as well as the maternal uterus, adnexal regions, and pelvic cul-de-sac. Transvaginal technique was performed to assess early pregnancy. COMPARISON:  None. FINDINGS: Intrauterine gestational sac: None Yolk sac:  Not Visualized. Embryo:  Not Visualized. Cardiac Activity: Not Visualized. Heart Rate: Not applicable Subchorionic hemorrhage:  Not applicable. Maternal uterus/adnexae: The right ovary  measures 3.7 x 2.4 x 3 cm and there is a simple 2.5 x 2 x 1.9 cm cyst seen within. Heterogeneous complex fluid noted within the endometrial cavity measuring up to 3.1 cm in thickness. Findings may reflect blood products or products of conception. Linear echogenic focus in the lower uterine segment raises the possibility of an intrauterine device. Correlate clinically. Along the posterior left border of the uterus is what appears to be the left ovary measuring 3.4 x 1.6 x 2.9 cm. There is heterogeneous soft tissue in the left adnexal which is nonspecific. This overall measures approximately 7.3 x 6.3 x 6.8 cm. No definite findings of an ectopic pregnancy but should be correlated with serial HCG and follow-up ultrasound. IMPRESSION: No intrauterine or definite ectopic pregnancy is noted. Complex fluid in the endometrial cavity measuring up to 3.1 cm in thickness may reflect products of conception or blood products. Heterogeneous prominence of soft tissue in the left adnexa measuring up to 7.3 x 6.3 x 6.8 cm is identified, nonspecific. As an ectopic cannot be entirely excluded, serial HCG and follow-up ultrasound from an imaging perspective is recommended. Question of an intrauterine device in the lower uterine segment.  Correlate clinically. Electronically Signed   By: Ashley Royalty M.D.   On: 06/08/2016 00:41   US Ob Transvaginal  Result Date: 06/08/2016 CLINICAL DATA:  Vaginal bleeding and pain for 1 day.  Pregnant. EXAM: OBSTETRIC <14 WK [place] AND TRANSVAGINAL OB US TECHNIQUE: Both transabdominal and transvaginal ultrasound examinations were performed for complete evaluation of the gestation as well as the maternal uterus, adnexal regions, and pelvic cul-de-sac. Transvaginal technique was performed to assess early pregnancy. COMPARISON:  None. FINDINGS: Intrauterine gestational sac: None Yolk sac:  Not Visualized. Embryo:  Not Visualized. Cardiac Activity: Not Visualized. Heart Rate: Not applicable Subchorionic hemorrhage:  Not applicable. Maternal uterus/adnexae: The right ovary measures 3.7 x 2.4 x 3 cm and there is a simple 2.5 x 2 x 1.9 cm cyst seen within. Heterogeneous complex fluid noted within the endometrial cavity measuring up to 3.1 cm in thickness. Findings may reflect blood products or products of conception. Linear echogenic focus in the lower uterine segment raises the possibility of an intrauterine device. Correlate clinically. Along the posterior left border of the uterus is what appears to be the left ovary measuring 3.4 x 1.6 x 2.9 cm. There is heterogeneous soft tissue in the left adnexal which is nonspecific. This overall measures approximately 7.3 x 6.3 x 6.8 cm. No definite findings of an ectopic pregnancy but should be correlated with serial HCG and follow-up ultrasound. IMPRESSION: No intrauterine or definite ectopic pregnancy is noted. Complex fluid in the endometrial cavity measuring up to 3.1 cm in thickness may reflect products of conception or blood products. Heterogeneous prominence of soft tissue in the left adnexa measuring up to 7.3 x 6.3 x 6.8 cm is identified, nonspecific. As an ectopic cannot be entirely excluded, serial HCG and follow-up ultrasound from an imaging perspective is recommended.  Question of an intrauterine device in the lower uterine segment. Correlate clinically. Electronically Signed   By: Ashley Royalty M.D.   On: 06/08/2016 00:41    Procedures Procedures (including critical care time)  Medications Ordered in ED   Initial Impression / Assessment and Plan / ED Course  I have reviewed the triage vital signs and the nursing notes.  Pertinent labs & imaging results that were available during my care of the patient were reviewed by me and considered in my medical  decision making (see chart for details).  Clinical Course    35 year old female here with abdominal pain. Describes  this as abdominal cramping. Here she is afebrile and nontoxic. Endorses pain in the suprapubic region. There is no rebound or guarding. Labwork consistent with early pregnancy. Patient does report normal menstrual cycle last month. She does have an IUD in place. On pelvic exam there is moderate amount of bleeding without clots or membranous tissue. Her cervix is not completely visualized nor are her IUD strings. Ultrasound was obtained, no definitive intrauterine or ectopic pregnancy noted. Her IUD is noted on ultrasound.  Wet prep does show clue cells, will treat with metrogel.  HIV, RPR, Gc/chl pending.  ABO is O+, not a candidate for rhogam.  Will refer back to OB-GYN for follow-up ASAP.  With her IUD in place, she is high risk for potential complications and she may need IUD removed in the near future but discussed why this should be done by OB-GYN.  She acknowledged understanding of this and need for follow-up.  Will also start prenatals.  Discussed plan with patient, she acknowledged understanding and agreed with plan of care.  [facility] pertaining to pregnancy-- increased pain, discharge, increased bleeding, etc.    Final Clinical Impressions(s) / ED Diagnoses   Final diagnoses:  Vaginal bleeding  Less than [redacted] weeks gestation of  pregnancy    New Prescriptions Discharge Medication List as of 06/08/2016  1:02 AM    START taking these medications   Details  metroNIDAZOLE (METROGEL VAGINAL) 0.75 % vaginal gel Place 1 Applicatorful vaginally 2 (two) times daily., Starting Tue 06/08/2016, Print    Prenatal Vit-Fe Fumarate-FA (PRENATAL COMPLETE) 14-0.4 MG TABS Take 1 tablet by mouth daily., Starting Tue 06/08/2016, Print         Larene Pickett, PA-C 06/08/16 0143    Fatima Blank, MD 06/08/16 2356

## 2016-06-07 NOTE — ED Triage Notes (Signed)
VS per EMS 144/92, 76, 18 Resp, 97% on RA.

## 2016-06-08 ENCOUNTER — Telehealth: Payer: Self-pay | Admitting: General Practice

## 2016-06-08 LAB — RPR: RPR: NONREACTIVE

## 2016-06-08 LAB — GC/CHLAMYDIA PROBE AMP (~~LOC~~) NOT AT ARMC
CHLAMYDIA, DNA PROBE: NEGATIVE
Neisseria Gonorrhea: NEGATIVE

## 2016-06-08 LAB — HIV ANTIBODY (ROUTINE TESTING W REFLEX): HIV SCREEN 4TH GENERATION: NONREACTIVE

## 2016-06-08 LAB — ABO/RH: ABO/RH(D): O POS

## 2016-06-08 MED ORDER — PRENATAL COMPLETE 14-0.4 MG PO TABS: 1.0000 | ORAL_TABLET | Freq: Every day | ORAL | 0 refills | Status: DC

## 2016-06-08 MED ORDER — METRONIDAZOLE 0.75 % VA GEL
1.0000 | Freq: Two times a day (BID) | VAGINAL | 0 refills | Status: DC
Start: 2016-06-08 — End: 2016-06-09

## 2016-06-08 NOTE — ED Notes (Signed)
PA at bedside.

## 2016-06-08 NOTE — Discharge Instructions (Signed)
As we discussed, your lab work indicates early pregnancy. There was no definitive intrauterine pregnancy noted on ultrasound today. Given your IUD in place, you do have a higher risk for complications. I do recommend that you follow-up with your OB/GYN as soon as possible. You will need follow-up lab work as well as ultrasound. Please go to [facility] for any new or worsening symptoms in regards to pregnanct vaginal bleeding, pelvic pain, etc.

## 2016-06-08 NOTE — ED Notes (Signed)
Pt back in room. No distress observed. 

## 2016-06-08 NOTE — Telephone Encounter (Signed)
Patient states she was in the ER yesterday and she was told to call here to follow up. Patient states her pain has improved today and denies bleeding. Reviewed patient's chart with Dr Ihor Dow who states patient needs stat bhcg tomorrow & needs to see a provider. Made appt for tomorrow @ 9am and informed patient. Also discussed with patient that she should report to [facility] if her pain becomes severe again, she starts to bleed or becomes short of breath. Patient verbalized understanding to all.

## 2016-06-09 ENCOUNTER — Ambulatory Visit (HOSPITAL_COMMUNITY)
Admission: RE | Admit: 2016-06-09 | Discharge: 2016-06-09 | Disposition: A | Discharge status: Home / Self Care | Payer: Medicaid Other | Source: Ambulatory Visit | Attending: Obstetrics & Gynecology | Admitting: Obstetrics & Gynecology

## 2016-06-09 ENCOUNTER — Other Ambulatory Visit: Payer: Self-pay | Admitting: Obstetrics & Gynecology

## 2016-06-09 ENCOUNTER — Encounter (HOSPITAL_COMMUNITY): Payer: Self-pay | Admitting: *Deleted

## 2016-06-09 ENCOUNTER — Encounter (HOSPITAL_COMMUNITY)
Admission: AD | Disposition: A | Discharge status: Home / Self Care | Payer: Self-pay | Source: Ambulatory Visit | Attending: Obstetrics & Gynecology

## 2016-06-09 ENCOUNTER — Ambulatory Visit: Payer: Medicaid Other | Admitting: Obstetrics & Gynecology

## 2016-06-09 ENCOUNTER — Ambulatory Visit (HOSPITAL_COMMUNITY)
Admission: AD | Admit: 2016-06-09 | Discharge: 2016-06-09 | Disposition: A | Discharge status: Home / Self Care | Payer: Medicaid Other | Source: Ambulatory Visit | Attending: Obstetrics & Gynecology | Admitting: Obstetrics & Gynecology

## 2016-06-09 ENCOUNTER — Inpatient Hospital Stay (HOSPITAL_COMMUNITY): Payer: Medicaid Other | Admitting: Anesthesiology

## 2016-06-09 VITALS — BP 138/69 | HR 77 | Wt 199.2 lb

## 2016-06-09 DIAGNOSIS — O00102 Left tubal pregnancy without intrauterine pregnancy: Secondary | ICD-10-CM

## 2016-06-09 DIAGNOSIS — O09522 Supervision of elderly multigravida, second trimester: Secondary | ICD-10-CM | POA: Diagnosis not present

## 2016-06-09 DIAGNOSIS — F17210 Nicotine dependence, cigarettes, uncomplicated: Secondary | ICD-10-CM | POA: Diagnosis not present

## 2016-06-09 DIAGNOSIS — K661 Hemoperitoneum: Secondary | ICD-10-CM | POA: Diagnosis present

## 2016-06-09 DIAGNOSIS — O00109 Unspecified tubal pregnancy without intrauterine pregnancy: Secondary | ICD-10-CM

## 2016-06-09 DIAGNOSIS — D649 Anemia, unspecified: Secondary | ICD-10-CM | POA: Diagnosis not present

## 2016-06-09 DIAGNOSIS — O3680X0 Pregnancy with inconclusive fetal viability, not applicable or unspecified: Secondary | ICD-10-CM

## 2016-06-09 DIAGNOSIS — Z3A14 14 weeks gestation of pregnancy: Secondary | ICD-10-CM | POA: Diagnosis not present

## 2016-06-09 DIAGNOSIS — O162 Unspecified maternal hypertension, second trimester: Secondary | ICD-10-CM | POA: Insufficient documentation

## 2016-06-09 DIAGNOSIS — O99332 Smoking (tobacco) complicating pregnancy, second trimester: Secondary | ICD-10-CM | POA: Diagnosis not present

## 2016-06-09 DIAGNOSIS — O99012 Anemia complicating pregnancy, second trimester: Secondary | ICD-10-CM | POA: Diagnosis not present

## 2016-06-09 HISTORY — PX: LAPAROSCOPY: SHX197

## 2016-06-09 HISTORY — DX: Cardiac murmur, unspecified: R01.1

## 2016-06-09 HISTORY — PX: UNILATERAL SALPINGECTOMY: SHX6160

## 2016-06-09 HISTORY — DX: Herpesviral infection, unspecified: B00.9

## 2016-06-09 HISTORY — DX: Unspecified infectious disease: B99.9

## 2016-06-09 LAB — CBC
HCT: 31.3 % — ABNORMAL LOW (ref 36.0–46.0)
Hemoglobin: 10.4 g/dL — ABNORMAL LOW (ref 12.0–15.0)
MCH: 27.5 pg (ref 26.0–34.0)
MCHC: 33.2 g/dL (ref 30.0–36.0)
MCV: 82.8 fL (ref 78.0–100.0)
PLATELETS: 254 10*3/uL (ref 150–400)
RBC: 3.78 MIL/uL — AB (ref 3.87–5.11)
RDW: 17.4 % — ABNORMAL HIGH (ref 11.5–15.5)
WBC: 7.8 10*3/uL (ref 4.0–10.5)

## 2016-06-09 LAB — TYPE AND SCREEN
ABO/RH(D): O POS
Antibody Screen: NEGATIVE

## 2016-06-09 LAB — HCG, QUANTITATIVE, PREGNANCY: hCG, Beta Chain, Quant, S: 497 m[IU]/mL — ABNORMAL HIGH (ref <5)

## 2016-06-09 SURGERY — LAPAROSCOPY OPERATIVE
Anesthesia: General | Site: Abdomen

## 2016-06-09 MED ORDER — LACTATED RINGERS IR SOLN
Status: DC | PRN
Start: 2016-06-09 — End: 2016-06-09
  Administered 2016-06-09: 3000 mL

## 2016-06-09 MED ORDER — KETOROLAC TROMETHAMINE 30 MG/ML IJ SOLN
30.0000 mg | Freq: Once | INTRAMUSCULAR | Status: AC
  Administered 2016-06-09: 30 mg via INTRAVENOUS
  Filled 2016-06-09: qty 1

## 2016-06-09 MED ORDER — DOCUSATE SODIUM 100 MG PO CAPS: 100.0000 mg | ORAL_CAPSULE | Freq: Two times a day (BID) | ORAL | 2 refills | Status: DC | PRN

## 2016-06-09 MED ORDER — SUGAMMADEX SODIUM 200 MG/2ML IV SOLN
INTRAVENOUS | Status: DC | PRN
  Administered 2016-06-09: 200 mg via INTRAVENOUS

## 2016-06-09 MED ORDER — MIDAZOLAM HCL 2 MG/2ML IJ SOLN
INTRAMUSCULAR | Status: DC | PRN
  Administered 2016-06-09: 2 mg via INTRAVENOUS

## 2016-06-09 MED ORDER — BUPIVACAINE HCL (PF) 0.5 % IJ SOLN
INTRAMUSCULAR | Status: DC | PRN
  Administered 2016-06-09: 30 mL

## 2016-06-09 MED ORDER — IBUPROFEN 600 MG PO TABS: 600.0000 mg | ORAL_TABLET | Freq: Four times a day (QID) | ORAL | 3 refills | Status: DC | PRN

## 2016-06-09 MED ORDER — LIDOCAINE HCL (CARDIAC) 20 MG/ML IV SOLN
INTRAVENOUS | Status: DC | PRN
  Administered 2016-06-09: 70 mg via INTRAVENOUS

## 2016-06-09 MED ORDER — CEFAZOLIN SODIUM-DEXTROSE 2-4 GM/100ML-% IV SOLN
2.0000 g | Freq: Once | INTRAVENOUS | Status: AC
  Administered 2016-06-09 (×2): 2 g via INTRAVENOUS
  Filled 2016-06-09: qty 100

## 2016-06-09 MED ORDER — PROPOFOL 10 MG/ML IV BOLUS
INTRAVENOUS | Status: DC | PRN
  Administered 2016-06-09: 160 mg via INTRAVENOUS

## 2016-06-09 MED ORDER — GLYCOPYRROLATE 0.2 MG/ML IJ SOLN
INTRAMUSCULAR | Status: DC | PRN
  Administered 2016-06-09: 0.1 mg via INTRAVENOUS

## 2016-06-09 MED ORDER — LACTATED RINGERS IV SOLN
INTRAVENOUS | Status: DC | PRN
  Administered 2016-06-09 (×3): via INTRAVENOUS

## 2016-06-09 MED ORDER — ONDANSETRON HCL 4 MG/2ML IJ SOLN
INTRAMUSCULAR | Status: DC | PRN
  Administered 2016-06-09: 4 mg via INTRAVENOUS

## 2016-06-09 MED ORDER — FENTANYL CITRATE (PF) 100 MCG/2ML IJ SOLN
INTRAMUSCULAR | Status: DC | PRN
  Administered 2016-06-09 (×2): 100 ug via INTRAVENOUS

## 2016-06-09 MED ORDER — ROCURONIUM BROMIDE 100 MG/10ML IV SOLN
INTRAVENOUS | Status: DC | PRN
  Administered 2016-06-09: 40 mg via INTRAVENOUS
  Administered 2016-06-09: 10 mg via INTRAVENOUS

## 2016-06-09 MED ORDER — DEXAMETHASONE SODIUM PHOSPHATE 4 MG/ML IJ SOLN
INTRAMUSCULAR | Status: DC | PRN
  Administered 2016-06-09: 4 mg via INTRAVENOUS

## 2016-06-09 MED ORDER — SOD CITRATE-CITRIC ACID 500-334 MG/5ML PO SOLN
30.0000 mL | Freq: Once | ORAL | Status: AC
  Administered 2016-06-09: 30 mL via ORAL
  Filled 2016-06-09: qty 15

## 2016-06-09 MED ORDER — KETOROLAC TROMETHAMINE 30 MG/ML IJ SOLN
INTRAMUSCULAR | Status: DC | PRN
  Administered 2016-06-09: 30 mg via INTRAVENOUS

## 2016-06-09 MED ORDER — FAMOTIDINE IN NACL 20-0.9 MG/50ML-% IV SOLN
20.0000 mg | Freq: Once | INTRAVENOUS | Status: AC
  Administered 2016-06-09: 20 mg via INTRAVENOUS
  Filled 2016-06-09: qty 50

## 2016-06-09 MED ORDER — LACTATED RINGERS IV BOLUS (SEPSIS)
1000.0000 mL | Freq: Once | INTRAVENOUS | Status: AC
  Administered 2016-06-09: 1000 mL via INTRAVENOUS

## 2016-06-09 MED ORDER — OXYCODONE-ACETAMINOPHEN 5-325 MG PO TABS: 1.0000 | ORAL_TABLET | Freq: Four times a day (QID) | ORAL | 0 refills | Status: DC | PRN

## 2016-06-09 SURGICAL SUPPLY — 29 items
CABLE HIGH FREQUENCY MONO STRZ (ELECTRODE) IMPLANT
CLOTH BEACON ORANGE TIMEOUT ST (SAFETY) ×4 IMPLANT
DRSG OPSITE POSTOP 3X4 (GAUZE/BANDAGES/DRESSINGS) ×4 IMPLANT
DURAPREP 26ML APPLICATOR (WOUND CARE) ×4 IMPLANT
GLOVE BIOGEL PI IND STRL 7.0 (GLOVE) ×8 IMPLANT
GLOVE BIOGEL PI INDICATOR 7.0 (GLOVE) ×8
GLOVE ECLIPSE 7.0 STRL STRAW (GLOVE) ×4 IMPLANT
GOWN STRL REUS W/TWL LRG LVL3 (GOWN DISPOSABLE) ×12 IMPLANT
LIQUID BAND (GAUZE/BANDAGES/DRESSINGS) ×4 IMPLANT
NEEDLE INSUFFLATION 120MM (ENDOMECHANICALS) IMPLANT
NS IRRIG 1000ML POUR BTL (IV SOLUTION) IMPLANT
PACK LAPAROSCOPY BASIN (CUSTOM PROCEDURE TRAY) ×4 IMPLANT
PACK TRENDGUARD 450 HYBRID PRO (MISCELLANEOUS) ×2 IMPLANT
PACK TRENDGUARD 600 HYBRD PROC (MISCELLANEOUS) IMPLANT
POUCH SPECIMEN RETRIEVAL 10MM (ENDOMECHANICALS) ×4 IMPLANT
PROTECTOR NERVE ULNAR (MISCELLANEOUS) ×8 IMPLANT
SET IRRIG TUBING LAPAROSCOPIC (IRRIGATION / IRRIGATOR) ×4 IMPLANT
SHEARS HARMONIC ACE PLUS 36CM (ENDOMECHANICALS) ×4 IMPLANT
SLEEVE XCEL OPT CAN 5 100 (ENDOMECHANICALS) ×4 IMPLANT
SUT VICRYL 0 UR6 27IN ABS (SUTURE) ×8 IMPLANT
SUT VICRYL 4-0 PS2 18IN ABS (SUTURE) ×4 IMPLANT
TOWEL OR 17X24 6PK STRL BLUE (TOWEL DISPOSABLE) ×8 IMPLANT
TRAY FOLEY CATH SILVER 14FR (SET/KITS/TRAYS/PACK) ×4 IMPLANT
TRENDGUARD 450 HYBRID PRO PACK (MISCELLANEOUS) ×4
TRENDGUARD 600 HYBRID PROC PK (MISCELLANEOUS)
TROCAR XCEL NON-BLD 11X100MML (ENDOMECHANICALS) ×4 IMPLANT
TROCAR XCEL NON-BLD 5MMX100MML (ENDOMECHANICALS) ×4 IMPLANT
WARMER LAPAROSCOPE (MISCELLANEOUS) ×4 IMPLANT
WATER STERILE IRR 1000ML POUR (IV SOLUTION) IMPLANT

## 2016-06-09 NOTE — H&P (View-Only) (Signed)
GYNECOLOGY VISIT NOTE  35 y.o. [id number] here today for repeat HCG in the setting of pregnancy of unknown location; was initially evaluated at [facility] ED.  There her H+BHCG was 1011, ultrasound did not show definite IUP but showed IUD in place, ? left adnexa soft tissue lesion of unclear etiology.   She reported increased lower abdominal pain today, rated at 8/10.  Still has minimal bleeding.    06/08/2016 OBSTETRIC <14 WK [place] AND TRANSVAGINAL OB US CLINICAL DATA:  Vaginal bleeding and pain for 1 day.  Pregnant.  COMPARISON:  None. FINDINGS: Intrauterine gestational sac: None Yolk sac:  Not Visualized. Embryo:  Not Visualized. Cardiac Activity: Not Visualized. Heart Rate: Not applicable Subchorionic hemorrhage:  Not applicable. Maternal uterus/adnexae: The right ovary measures 3.7 x 2.4 x 3 cm and there is a simple 2.5 x 2 x 1.9 cm cyst seen within. Heterogeneous complex fluid noted within the endometrial cavity measuring up to 3.1 cm in thickness. Findings may reflect blood products or products of conception. Linear echogenic focus in the lower uterine segment raises the possibility of an intrauterine device. Correlate clinically. Along the posterior left border of the uterus is what appears to be the left ovary measuring 3.4 x 1.6 x 2.9 cm. There is heterogeneous soft tissue in the left adnexal which is nonspecific. This overall measures approximately 7.3 x 6.3 x 6.8 cm. No definite findings of an ectopic pregnancy but should be correlated with serial HCG and follow-up ultrasound. IMPRESSION: No intrauterine or definite ectopic pregnancy is noted. Complex fluid in the endometrial cavity measuring up to 3.1 cm in thickness may reflect products of conception or blood products. Heterogeneous prominence of soft tissue in the left adnexa measuring up to 7.3 x 6.3 x 6.8 cm is identified, nonspecific. As an ectopic cannot be entirely excluded, serial HCG and follow-up ultrasound from an imaging perspective is  recommended. Question of an intrauterine device in the lower uterine segment. Correlate clinically. Electronically Signed   By: Ashley Royalty M.D.   On: 06/08/2016 00:41   Ultrasound on 06/07/16 reviewed with [facility] Radiologist on call, repeat STAT transvaginal ordered given equivocal read and patient's pain today.  Stat BHCG repeated; will follow up results and manage accordingly.  Osborne Oman, MD 06/09/2016  10:00 AM  Follow up Note  BHCG today is 497 down from 1011 on 11/16  06/09/2016 OBSTETRIC <14 WK [place] AND TRANSVAGINAL OB US  CLINICAL DATA:  Follow-up pregnancy of unknown location  COMPARISON:  06/07/2016 FINDINGS: Intrauterine gestational sac: None Yolk sac:  Not Visualized. Embryo:  Not Visualized. Cardiac Activity: Not Visualized. Maternal uterus/adnexae: Subchorionic hemorrhage: Right ovary: Appears normal measuring 4.2 x 2.2 x 2.2 cm. Left ovary: 4.1 x 1.9 x 1.6 cm. Within the left adnexa there is a complex and heterogeneous solid-appearing mass which is decreased in size from previous exam. Currently this measures 5.0 x 2.0 by 1.8 cm. On the previous exam this measured 7.3 x 6.3 x 6.8 cm. Other :Complex hypoechoic fluid within the endometrial cavity measures 4.8 by 3.5 x 2.7 cm. Decreased in echogenicity when compared with the previous exam. IUD is identified within the cervix. Free fluid:  Small amount of free fluid noted within the pelvis. IMPRESSION: 1. No evidence for intrauterine pregnancy. 2. Complex, heterogeneous solid appearing mass in the left adnexa is decreased in size when compared with previous exam. In the setting of positive pregnancy test with declining beta HCG this is favored to represent ectopic/tubal pregnancy with resolving hematoma.  3. Subacute hemorrhage identified within the endometrium. 4. Ectopic location of IUD within the cervix. Electronically Signed   By: Kerby Moors M.D.   On: 06/09/2016 12:03    Results reviewed with patient. She has a resolving ectopic  pregnancy, expectant management can be an option. Patient declined this option, desires surgical management with removal of the affected tube.  She is concerned about worsening pain; has not been able to "do anything" in past two days. Will proceed with surgery. House Coverage aware.   Medical History: Past Medical History:  Diagnosis Date  . Anemia   . Pregnancy induced hypertension    2001   Past Surgical History:  Procedure Laterality Date  . CHOLECYSTECTOMY     Social History   Social History  . Marital status: Single    Spouse name: N/A  . Number of children: N/A  . Years of education: N/A   Occupational History  . Not on file.   Social History Main Topics  . Smoking status: Current Every Day Smoker    Packs/day: 0.50    Years: 7.00    Types: Cigarettes  . Smokeless tobacco: Current User     Comment: 2-3 cigs/day  . Alcohol use No     Comment: social drinker  . Drug use: No  . Sexual activity: Yes    Birth control/ protection: IUD   Other Topics Concern  . Not on file   Social History Narrative  . No narrative on file   Current Outpatient Prescriptions on File Prior to Visit  Medication Sig Dispense Refill  . metroNIDAZOLE (METROGEL VAGINAL) 0.75 % vaginal gel Place 1 Applicatorful vaginally 2 (two) times daily. (Patient not taking: Reported on 06/09/2016) 70 g 0  . mupirocin ointment (BACTROBAN) 2 % Apply into both nostrils twice daily for 5 days (Patient not taking: Reported on 06/09/2016) 22 g 0  . Prenatal Vit-Fe Fumarate-FA (PRENATAL COMPLETE) 14-0.4 MG TABS Take 1 tablet by mouth daily. (Patient not taking: Reported on 06/09/2016) 60 each 0  . sulfamethoxazole-trimethoprim (BACTRIM DS,SEPTRA DS) 800-160 MG tablet Take 1 tablet by mouth 2 (two) times daily. (Patient not taking: Reported on 06/09/2016) 20 tablet 0  . valACYclovir (VALTREX) 1000 MG tablet Take 1 tablet (1,000 mg total) by mouth 2 (two) times daily. (Patient not taking: Reported on 06/09/2016) 14  tablet 0   No current facility-administered medications on file prior to visit.    No Known Allergies  I have reviewed patient's Past Medical Hx, Surgical Hx, Family Hx, Social Hx, medications and allergies.   Review of Systems Review of Systems  Constitutional: Negative for fever and chills.  Gastrointestinal: Negative for nausea, vomiting, abdominal pain, diarrhea and constipation.  Genitourinary: Negative for dysuria.  Musculoskeletal: Negative for back pain.  Neurological: Negative for dizziness and weakness.    Physical Exam  BP 138/69   Pulse 77   Wt 199 lb 3.2 oz (90.4 kg)   BMI 32.15 kg/m   GENERAL: Well-developed, well-nourished female in no acute distress.  HEENT: Normocephalic, atraumatic.   LUNGS: Effort normal ABDOMEN: soft, diffuse lower abdominal tenderness, no rebound or guarding. HEART: Regular rate  SKIN: Warm, dry and without erythema PSYCH: Normal mood and affect NEURO: Alert and oriented x 4  LAB RESULTS Results for orders placed or performed in visit on 06/09/16 (from the past 24 hour(s))  hCG, quantitative, pregnancy     Status: Abnormal   Collection Time: 06/09/16  9:25 AM  Result Value Ref Range  hCG, Beta Chain, Quant, S 497 (H) <5 mIU/mL    IMAGING US Ob Comp Less 14 Wks  Result Date: 06/08/2016 CLINICAL DATA:  Vaginal bleeding and pain for 1 day.  Pregnant. EXAM: OBSTETRIC <14 WK [place] AND TRANSVAGINAL OB US TECHNIQUE: Both transabdominal and transvaginal ultrasound examinations were performed for complete evaluation of the gestation as well as the maternal uterus, adnexal regions, and pelvic cul-de-sac. Transvaginal technique was performed to assess early pregnancy. COMPARISON:  None. FINDINGS: Intrauterine gestational sac: None Yolk sac:  Not Visualized. Embryo:  Not Visualized. Cardiac Activity: Not Visualized. Heart Rate: Not applicable Subchorionic hemorrhage:  Not applicable. Maternal uterus/adnexae: The right ovary measures 3.7 x 2.4 x 3 cm  and there is a simple 2.5 x 2 x 1.9 cm cyst seen within. Heterogeneous complex fluid noted within the endometrial cavity measuring up to 3.1 cm in thickness. Findings may reflect blood products or products of conception. Linear echogenic focus in the lower uterine segment raises the possibility of an intrauterine device. Correlate clinically. Along the posterior left border of the uterus is what appears to be the left ovary measuring 3.4 x 1.6 x 2.9 cm. There is heterogeneous soft tissue in the left adnexal which is nonspecific. This overall measures approximately 7.3 x 6.3 x 6.8 cm. No definite findings of an ectopic pregnancy but should be correlated with serial HCG and follow-up ultrasound. IMPRESSION: No intrauterine or definite ectopic pregnancy is noted. Complex fluid in the endometrial cavity measuring up to 3.1 cm in thickness may reflect products of conception or blood products. Heterogeneous prominence of soft tissue in the left adnexa measuring up to 7.3 x 6.3 x 6.8 cm is identified, nonspecific. As an ectopic cannot be entirely excluded, serial HCG and follow-up ultrasound from an imaging perspective is recommended. Question of an intrauterine device in the lower uterine segment. Correlate clinically. Electronically Signed   By: Ashley Royalty M.D.   On: 06/08/2016 00:41   US Ob Transvaginal  Result Date: 06/09/2016 CLINICAL DATA:  Follow-up pregnancy of unknown location EXAM: OBSTETRIC <14 WK [place] AND TRANSVAGINAL OB US TECHNIQUE: Both transabdominal and transvaginal ultrasound examinations were performed for complete evaluation of the gestation as well as the maternal uterus, adnexal regions, and pelvic cul-de-sac. Transvaginal technique was performed to assess early pregnancy. COMPARISON:  06/07/2016 FINDINGS: Intrauterine gestational sac: None Yolk sac:  Not Visualized. Embryo:  Not Visualized. Cardiac Activity: Not Visualized. Maternal uterus/adnexae: Subchorionic hemorrhage: Right ovary: Appears  normal measuring 4.2 x 2.2 x 2.2 cm. Left ovary: 4.1 x 1.9 x 1.6 cm. Within the left adnexa there is a complex and heterogeneous solid-appearing mass which is decreased in size from previous exam. Currently this measures 5.0 x 2.0 by 1.8 cm. On the previous exam this measured 7.3 x 6.3 x 6.8 cm. Other :Complex hypoechoic fluid within the endometrial cavity measures 4.8 by 3.5 x 2.7 cm. Decreased in echogenicity when compared with the previous exam. IUD is identified within the cervix. Free fluid:  Small amount of free fluid noted within the pelvis. IMPRESSION: 1. No evidence for intrauterine pregnancy. 2. Complex, heterogeneous solid appearing mass in the left adnexa is decreased in size when compared with previous exam. In the setting of positive pregnancy test with declining beta HCG this is favored to represent ectopic/tubal pregnancy with resolving hematoma. 3. Subacute hemorrhage identified within the endometrium. 4. Ectopic location of IUD within the cervix. Electronically Signed   By: Kerby Moors M.D.   On: 06/09/2016 12:03   US  Ob Transvaginal  Result Date: 06/08/2016 CLINICAL DATA:  Vaginal bleeding and pain for 1 day.  Pregnant. EXAM: OBSTETRIC <14 WK [place] AND TRANSVAGINAL OB US TECHNIQUE: Both transabdominal and transvaginal ultrasound examinations were performed for complete evaluation of the gestation as well as the maternal uterus, adnexal regions, and pelvic cul-de-sac. Transvaginal technique was performed to assess early pregnancy. COMPARISON:  None. FINDINGS: Intrauterine gestational sac: None Yolk sac:  Not Visualized. Embryo:  Not Visualized. Cardiac Activity: Not Visualized. Heart Rate: Not applicable Subchorionic hemorrhage:  Not applicable. Maternal uterus/adnexae: The right ovary measures 3.7 x 2.4 x 3 cm and there is a simple 2.5 x 2 x 1.9 cm cyst seen within. Heterogeneous complex fluid noted within the endometrial cavity measuring up to 3.1 cm in thickness. Findings may reflect blood  products or products of conception. Linear echogenic focus in the lower uterine segment raises the possibility of an intrauterine device. Correlate clinically. Along the posterior left border of the uterus is what appears to be the left ovary measuring 3.4 x 1.6 x 2.9 cm. There is heterogeneous soft tissue in the left adnexal which is nonspecific. This overall measures approximately 7.3 x 6.3 x 6.8 cm. No definite findings of an ectopic pregnancy but should be correlated with serial HCG and follow-up ultrasound. IMPRESSION: No intrauterine or definite ectopic pregnancy is noted. Complex fluid in the endometrial cavity measuring up to 3.1 cm in thickness may reflect products of conception or blood products. Heterogeneous prominence of soft tissue in the left adnexa measuring up to 7.3 x 6.3 x 6.8 cm is identified, nonspecific. As an ectopic cannot be entirely excluded, serial HCG and follow-up ultrasound from an imaging perspective is recommended. Question of an intrauterine device in the lower uterine segment. Correlate clinically. Electronically Signed   By: Ashley Royalty M.D.   On: 06/08/2016 00:41    ASSESSMENT 1. Tubal pregnancy without intrauterine pregnancy, unspecified laterality     PLAN Patient was counseled regarding need for laparoscopic salpingectomy and removal of ectopic pregnancy. Risks of surgery including bleeding which may require transfusion or reoperation, infection, injury to bowel or other surrounding organs, need for additional procedures including (laparoscopy or) laparotomy were explained to patient and written informed consent was obtained.  Patient has been NPO since 1900 last night and she will remain NPO for procedure. Anesthesia and OR aware.    To OR when ready.    Osborne Oman, MD  06/09/2016  12:25 PM

## 2016-06-09 NOTE — Progress Notes (Addendum)
GYNECOLOGY VISIT NOTE  35 y.o. [id number] here today for repeat HCG in the setting of pregnancy of unknown location; was initially evaluated at [facility] ED.  There her HCG was 1011, ultrasound did not show definite IUP but showed IUD in place, ? left adnexa soft tissue lesion of unclear etiology.   She reported increased lower abdominal pain today, rated at 8/10.  Still has minimal bleeding.    06/08/2016 OBSTETRIC <14 WK [place] AND TRANSVAGINAL OB US CLINICAL DATA:  Vaginal bleeding and pain for 1 day.  Pregnant.  COMPARISON:  None. FINDINGS: Intrauterine gestational sac: None Yolk sac:  Not Visualized. Embryo:  Not Visualized. Cardiac Activity: Not Visualized. Heart Rate: Not applicable Subchorionic hemorrhage:  Not applicable. Maternal uterus/adnexae: The right ovary measures 3.7 x 2.4 x 3 cm and there is a simple 2.5 x 2 x 1.9 cm cyst seen within. Heterogeneous complex fluid noted within the endometrial cavity measuring up to 3.1 cm in thickness. Findings may reflect blood products or products of conception. Linear echogenic focus in the lower uterine segment raises the possibility of an intrauterine device. Correlate clinically. Along the posterior left border of the uterus is what appears to be the left ovary measuring 3.4 x 1.6 x 2.9 cm. There is heterogeneous soft tissue in the left adnexal which is nonspecific. This overall measures approximately 7.3 x 6.3 x 6.8 cm. No definite findings of an ectopic pregnancy but should be correlated with serial HCG and follow-up ultrasound. IMPRESSION: No intrauterine or definite ectopic pregnancy is noted. Complex fluid in the endometrial cavity measuring up to 3.1 cm in thickness may reflect products of conception or blood products. Heterogeneous prominence of soft tissue in the left adnexa measuring up to 7.3 x 6.3 x 6.8 cm is identified, nonspecific. As an ectopic cannot be entirely excluded, serial HCG and follow-up ultrasound from an imaging perspective is recommended.  Question of an intrauterine device in the lower uterine segment. Correlate clinically. Electronically Signed   By: Ashley Royalty M.D.   On: 06/08/2016 00:41   Ultrasound on 06/07/16 reviewed with [facility] Radiologist on call, repeat STAT transvaginal ordered given equivocal read and patient's pain today.  Stat BHCG repeated; will follow up results and manage accordingly.  Osborne Oman, MD 06/09/2016  10:00 AM  Follow up Note  BHCG today is 497 down from 1011 on 11/16  06/09/2016 OBSTETRIC <14 WK [place] AND TRANSVAGINAL OB US  CLINICAL DATA:  Follow-up pregnancy of unknown location  COMPARISON:  06/07/2016 FINDINGS: Intrauterine gestational sac: None Yolk sac:  Not Visualized. Embryo:  Not Visualized. Cardiac Activity: Not Visualized. Maternal uterus/adnexae: Subchorionic hemorrhage: Right ovary: Appears normal measuring 4.2 x 2.2 x 2.2 cm. Left ovary: 4.1 x 1.9 x 1.6 cm. Within the left adnexa there is a complex and heterogeneous solid-appearing mass which is decreased in size from previous exam. Currently this measures 5.0 x 2.0 by 1.8 cm. On the previous exam this measured 7.3 x 6.3 x 6.8 cm. Other :Complex hypoechoic fluid within the endometrial cavity measures 4.8 by 3.5 x 2.7 cm. Decreased in echogenicity when compared with the previous exam. IUD is identified within the cervix. Free fluid:  Small amount of free fluid noted within the pelvis. IMPRESSION: 1. No evidence for intrauterine pregnancy. 2. Complex, heterogeneous solid appearing mass in the left adnexa is decreased in size when compared with previous exam. In the setting of positive pregnancy test with declining beta HCG this is favored to represent ectopic/tubal pregnancy with resolving hematoma.  3. Subacute hemorrhage identified within the endometrium. 4. Ectopic location of IUD within the cervix. Electronically Signed   By: Kerby Moors M.D.   On: 06/09/2016 12:03    Results reviewed with patient. She has a resolving ectopic pregnancy,  expectant management can be an option. Patient declined this option, desires surgical management with removal of the affected tube.  She is concerned about worsening pain; has not been able to "do anything" in past two days. Will proceed with surgery. House Coverage aware.   Medical History: Past Medical History:  Diagnosis Date  . Anemia   . Pregnancy induced hypertension    2001   Past Surgical History:  Procedure Laterality Date  . CHOLECYSTECTOMY     Social History   Social History  . Marital status: Single    Spouse name: N/A  . Number of children: N/A  . Years of education: N/A   Occupational History  . Not on file.   Social History Main Topics  . Smoking status: Current Every Day Smoker    Packs/day: 0.50    Years: 7.00    Types: Cigarettes  . Smokeless tobacco: Current User     Comment: 2-3 cigs/day  . Alcohol use No     Comment: social drinker  . Drug use: No  . Sexual activity: Yes    Birth control/ protection: IUD   Other Topics Concern  . Not on file   Social History Narrative  . No narrative on file   Current Outpatient Prescriptions on File Prior to Visit  Medication Sig Dispense Refill  . metroNIDAZOLE (METROGEL VAGINAL) 0.75 % vaginal gel Place 1 Applicatorful vaginally 2 (two) times daily. (Patient not taking: Reported on 06/09/2016) 70 g 0  . mupirocin ointment (BACTROBAN) 2 % Apply into both nostrils twice daily for 5 days (Patient not taking: Reported on 06/09/2016) 22 g 0  . Prenatal Vit-Fe Fumarate-FA (PRENATAL COMPLETE) 14-0.4 MG TABS Take 1 tablet by mouth daily. (Patient not taking: Reported on 06/09/2016) 60 each 0  . sulfamethoxazole-trimethoprim (BACTRIM DS,SEPTRA DS) 800-160 MG tablet Take 1 tablet by mouth 2 (two) times daily. (Patient not taking: Reported on 06/09/2016) 20 tablet 0  . valACYclovir (VALTREX) 1000 MG tablet Take 1 tablet (1,000 mg total) by mouth 2 (two) times daily. (Patient not taking: Reported on 06/09/2016) 14 tablet 0    No current facility-administered medications on file prior to visit.    No Known Allergies  I have reviewed patient's Past Medical Hx, Surgical Hx, Family Hx, Social Hx, medications and allergies.   Review of Systems Review of Systems  Constitutional: Negative for fever and chills.  Gastrointestinal: Negative for nausea, vomiting, abdominal pain, diarrhea and constipation.  Genitourinary: Negative for dysuria.  Musculoskeletal: Negative for back pain.  Neurological: Negative for dizziness and weakness.    Physical Exam  BP 138/69   Pulse 77   Wt 199 lb 3.2 oz (90.4 kg)   BMI 32.15 kg/m   GENERAL: Well-developed, well-nourished female in no acute distress.  HEENT: Normocephalic, atraumatic.   LUNGS: Effort normal ABDOMEN: soft, diffuse lower abdominal tenderness, no rebound or guarding. HEART: Regular rate  SKIN: Warm, dry and without erythema PSYCH: Normal mood and affect NEURO: Alert and oriented x 4  LAB RESULTS Results for orders placed or performed in visit on 06/09/16 (from the past 24 hour(s))  hCG, quantitative, pregnancy     Status: Abnormal   Collection Time: 06/09/16  9:25 AM  Result Value Ref Range  hCG, Beta Chain, Quant, S 497 (H) <5 mIU/mL    IMAGING US Ob Comp Less 14 Wks  Result Date: 06/08/2016 CLINICAL DATA:  Vaginal bleeding and pain for 1 day.  Pregnant. EXAM: OBSTETRIC <14 WK [place] AND TRANSVAGINAL OB US TECHNIQUE: Both transabdominal and transvaginal ultrasound examinations were performed for complete evaluation of the gestation as well as the maternal uterus, adnexal regions, and pelvic cul-de-sac. Transvaginal technique was performed to assess early pregnancy. COMPARISON:  None. FINDINGS: Intrauterine gestational sac: None Yolk sac:  Not Visualized. Embryo:  Not Visualized. Cardiac Activity: Not Visualized. Heart Rate: Not applicable Subchorionic hemorrhage:  Not applicable. Maternal uterus/adnexae: The right ovary measures 3.7 x 2.4 x 3 cm and there  is a simple 2.5 x 2 x 1.9 cm cyst seen within. Heterogeneous complex fluid noted within the endometrial cavity measuring up to 3.1 cm in thickness. Findings may reflect blood products or products of conception. Linear echogenic focus in the lower uterine segment raises the possibility of an intrauterine device. Correlate clinically. Along the posterior left border of the uterus is what appears to be the left ovary measuring 3.4 x 1.6 x 2.9 cm. There is heterogeneous soft tissue in the left adnexal which is nonspecific. This overall measures approximately 7.3 x 6.3 x 6.8 cm. No definite findings of an ectopic pregnancy but should be correlated with serial HCG and follow-up ultrasound. IMPRESSION: No intrauterine or definite ectopic pregnancy is noted. Complex fluid in the endometrial cavity measuring up to 3.1 cm in thickness may reflect products of conception or blood products. Heterogeneous prominence of soft tissue in the left adnexa measuring up to 7.3 x 6.3 x 6.8 cm is identified, nonspecific. As an ectopic cannot be entirely excluded, serial HCG and follow-up ultrasound from an imaging perspective is recommended. Question of an intrauterine device in the lower uterine segment. Correlate clinically. Electronically Signed   By: Ashley Royalty M.D.   On: 06/08/2016 00:41   US Ob Transvaginal  Result Date: 06/09/2016 CLINICAL DATA:  Follow-up pregnancy of unknown location EXAM: OBSTETRIC <14 WK [place] AND TRANSVAGINAL OB US TECHNIQUE: Both transabdominal and transvaginal ultrasound examinations were performed for complete evaluation of the gestation as well as the maternal uterus, adnexal regions, and pelvic cul-de-sac. Transvaginal technique was performed to assess early pregnancy. COMPARISON:  06/07/2016 FINDINGS: Intrauterine gestational sac: None Yolk sac:  Not Visualized. Embryo:  Not Visualized. Cardiac Activity: Not Visualized. Maternal uterus/adnexae: Subchorionic hemorrhage: Right ovary: Appears normal  measuring 4.2 x 2.2 x 2.2 cm. Left ovary: 4.1 x 1.9 x 1.6 cm. Within the left adnexa there is a complex and heterogeneous solid-appearing mass which is decreased in size from previous exam. Currently this measures 5.0 x 2.0 by 1.8 cm. On the previous exam this measured 7.3 x 6.3 x 6.8 cm. Other :Complex hypoechoic fluid within the endometrial cavity measures 4.8 by 3.5 x 2.7 cm. Decreased in echogenicity when compared with the previous exam. IUD is identified within the cervix. Free fluid:  Small amount of free fluid noted within the pelvis. IMPRESSION: 1. No evidence for intrauterine pregnancy. 2. Complex, heterogeneous solid appearing mass in the left adnexa is decreased in size when compared with previous exam. In the setting of positive pregnancy test with declining beta HCG this is favored to represent ectopic/tubal pregnancy with resolving hematoma. 3. Subacute hemorrhage identified within the endometrium. 4. Ectopic location of IUD within the cervix. Electronically Signed   By: Kerby Moors M.D.   On: 06/09/2016 12:03   US  Ob Transvaginal  Result Date: 06/08/2016 CLINICAL DATA:  Vaginal bleeding and pain for 1 day.  Pregnant. EXAM: OBSTETRIC <14 WK [place] AND TRANSVAGINAL OB US TECHNIQUE: Both transabdominal and transvaginal ultrasound examinations were performed for complete evaluation of the gestation as well as the maternal uterus, adnexal regions, and pelvic cul-de-sac. Transvaginal technique was performed to assess early pregnancy. COMPARISON:  None. FINDINGS: Intrauterine gestational sac: None Yolk sac:  Not Visualized. Embryo:  Not Visualized. Cardiac Activity: Not Visualized. Heart Rate: Not applicable Subchorionic hemorrhage:  Not applicable. Maternal uterus/adnexae: The right ovary measures 3.7 x 2.4 x 3 cm and there is a simple 2.5 x 2 x 1.9 cm cyst seen within. Heterogeneous complex fluid noted within the endometrial cavity measuring up to 3.1 cm in thickness. Findings may reflect blood products  or products of conception. Linear echogenic focus in the lower uterine segment raises the possibility of an intrauterine device. Correlate clinically. Along the posterior left border of the uterus is what appears to be the left ovary measuring 3.4 x 1.6 x 2.9 cm. There is heterogeneous soft tissue in the left adnexal which is nonspecific. This overall measures approximately 7.3 x 6.3 x 6.8 cm. No definite findings of an ectopic pregnancy but should be correlated with serial HCG and follow-up ultrasound. IMPRESSION: No intrauterine or definite ectopic pregnancy is noted. Complex fluid in the endometrial cavity measuring up to 3.1 cm in thickness may reflect products of conception or blood products. Heterogeneous prominence of soft tissue in the left adnexa measuring up to 7.3 x 6.3 x 6.8 cm is identified, nonspecific. As an ectopic cannot be entirely excluded, serial HCG and follow-up ultrasound from an imaging perspective is recommended. Question of an intrauterine device in the lower uterine segment. Correlate clinically. Electronically Signed   By: Ashley Royalty M.D.   On: 06/08/2016 00:41    ASSESSMENT 1. Tubal pregnancy without intrauterine pregnancy, unspecified laterality     PLAN Patient was counseled regarding need for laparoscopic salpingectomy and removal of ectopic pregnancy. Risks of surgery including bleeding which may require transfusion or reoperation, infection, injury to bowel or other surrounding organs, need for additional procedures including (laparoscopy or) laparotomy were explained to patient and written informed consent was obtained.  Patient has been NPO since 1900 last night and she will remain NPO for procedure. Anesthesia and OR aware.    To OR when ready.    Osborne Oman, MD  06/09/2016  12:25 PM

## 2016-06-09 NOTE — Anesthesia Postprocedure Evaluation (Signed)
Anesthesia Post Note  Patient: Denise Moses  Procedure(s) Performed: Procedure(s) (LRB): LAPAROSCOPY OPERATIVE (N/A) UNILATERAL SALPINGECTOMY, REMOVAL OF IUD (Left)  Patient location during evaluation: PACU Anesthesia Type: General Level of consciousness: sedated and patient cooperative Pain management: pain level controlled Vital Signs Assessment: post-procedure vital signs reviewed and stable Respiratory status: spontaneous breathing Cardiovascular status: stable Anesthetic complications: no     Last Vitals:  Vitals:   06/09/16 1730 06/09/16 1745  BP: (!) 145/79 120/68  Pulse: 92 71  Resp: 20 16  Temp:  36.8 C    Last Pain:  Vitals:   06/09/16 1745  TempSrc:   PainSc: 0-No pain   Pain Goal:                 [name]

## 2016-06-09 NOTE — Progress Notes (Signed)
Patient also has a malpositioned IUD; will also remove IUD.  Patient agreed with this plan.  Osborne Oman, MD

## 2016-06-09 NOTE — Interval H&P Note (Signed)
History and Physical Interval Note 06/09/2016 2:40 PM  Denise Moses  has presented today for surgery, with the diagnosis of ruptured ectopic   The various methods of treatment have been discussed with the patient and family. After consideration of risks, benefits and other options for treatment, the patient has consented to [place] as a surgical intervention .  The patient's history has been reviewed, patient examined, no change in status, stable for surgery.  I have reviewed the patient's chart and labs.  Questions were answered to the patient's satisfaction.  To OR soon.   Verita Schneiders, MD, [place] Attending [place], [facility] for [employer], [place]

## 2016-06-09 NOTE — MAU Note (Signed)
1440 IV LR #2 up after pt voided.

## 2016-06-09 NOTE — Op Note (Signed)
[name] PROCEDURE DATE: 06/09/2016  PREOPERATIVE DIAGNOSIS: Ruptured ectopic pregnancy POSTOPERATIVE DIAGNOSIS: Ruptured left fallopian tube ectopic pregnancy PROCEDURE: Laparoscopic left salpingectomy and removal of ectopic pregnancy SURGEON:  Dr. Verita Schneiders ANESTHESIOLOGIST: [name], MD Anesthesiologist: [name], MD CRNA: Asher Muir, CRNA  INDICATIONS: 35 y.o. [id number] at Unknown here with the preoperative diagnoses as listed above.  Please refer to preoperative notes for more details. Patient was counseled regarding need for laparoscopic salpingectomy. Risks of surgery including bleeding which may require transfusion or reoperation, infection, injury to bowel or other surrounding organs, need for additional procedures including laparotomy and other postoperative/anesthesia complications were explained to patient.  Written informed consent was obtained.  FINDINGS:  Moderate amount of hemoperitoneum estimated to be about 300 ml of blood and clots.  Dilated left fallopian tube containing ectopic gestation. Small normal appearing uterus, normal right fallopian tube, right ovary and left ovary.  ANESTHESIA: General INTRAVENOUS FLUIDS: 700 ml ESTIMATED BLOOD LOSS: 10 ml URINE OUTPUT: 100 ml SPECIMENS: Left fallopian tube containing ectopic gestation COMPLICATIONS: None immediate  PROCEDURE IN DETAIL:  The patient was taken to the operating room where general anesthesia was administered and was found to be adequate.  She was placed in the dorsal lithotomy position, and was prepped and draped in a sterile manner.  A Foley catheter was inserted into her bladder and attached to constant drainage and a uterine manipulator was then advanced into the uterus .    After an adequate timeout was performed, attention was turned to the abdomen where an umbilical incision was made with the scalpel.  The Optiview 11-mm trocar and sleeve were then advanced without difficulty  with the laparoscope under direct visualization into the abdomen.  The abdomen was then insufflated with carbon dioxide gas and adequate pneumoperitoneum was obtained.  A survey of the patient's pelvis and abdomen revealed the findings above.  Two 5-mm left lower quadrant ports were then placed under direct visualization.  The Nezhat suction irrigator was then used to suction the hemoperitoneum and irrigate the pelvis.  Attention was then turned to the left fallopian tube which was grasped and ligated from the underlying mesosalpinx and uterine attachment using the Harmonic instrument.  Good hemostasis was noted.  The specimen was placed in an EndoCatch bag and removed from the abdomen intact.  The abdomen was desufflated, and all instruments were removed.  The fascial incision of the 10-mm site was reapproximated with a 0 Vicryl figure-of-eight stitch; and all skin incisions were closed with 3-0 Vicryl and Dermabond. The patient tolerated the procedure well.  All instruments, needles, and sponge counts were correct x 2. The patient was taken to the recovery room in stable condition.   The patient will be discharged to home as per PACU criteria.  Routine postoperative instructions given.  She was prescribed Percocet, Ibuprofen and Colace.  She will follow up in the clinic in about 3-4 weeks for postoperative evaluation.    Verita Schneiders, MD, [place] Attending [place], [facility]

## 2016-06-09 NOTE — Anesthesia Preprocedure Evaluation (Signed)
Anesthesia Evaluation  Patient identified by MRN, date of birth, ID band Patient awake    Reviewed: Allergy & Precautions, NPO status , Patient's Chart, lab work & pertinent test results  Airway Mallampati: II  TM Distance: >3 FB Neck ROM: Full    Dental no notable dental hx.    Pulmonary Current Smoker,    Pulmonary exam normal breath sounds clear to auscultation       Cardiovascular hypertension, Normal cardiovascular exam Rhythm:Regular Rate:Normal     Neuro/Psych negative neurological ROS  negative psych ROS   GI/Hepatic negative GI ROS, Neg liver ROS,   Endo/Other  negative endocrine ROS  Renal/GU negative Renal ROS     Musculoskeletal negative musculoskeletal ROS (+)   Abdominal   Peds  Hematology negative hematology ROS (+) anemia ,   Anesthesia Other Findings   Reproductive/Obstetrics negative OB ROS Ectopic                             Anesthesia Physical Anesthesia Plan  ASA: II and emergent  Anesthesia Plan: General   Post-op Pain Management:    Induction: Intravenous  Airway Management Planned: Oral ETT  Additional Equipment:   Intra-op Plan:   Post-operative Plan: Extubation in OR  Informed Consent: I have reviewed the patients History and Physical, chart, labs and discussed the procedure including the risks, benefits and alternatives for the proposed anesthesia with the patient or authorized representative who has indicated his/her understanding and acceptance.   Dental advisory given  Plan Discussed with: CRNA  Anesthesia Plan Comments: (2x piv)        Anesthesia Quick Evaluation

## 2016-06-09 NOTE — Discharge Instructions (Addendum)
Laparoscopic Surgery - Care After Laparoscopy is a surgical procedure. It is used to diagnose and treat diseases inside the belly(abdomen). It is usually a brief, common, and relatively simple procedure. The laparoscopeis a thin, lighted, pencil-sized instrument. It is like a telescope. It is inserted into your abdomen through a small cut (incision). Your caregiver can look at the organs inside your body through this instrument.  She can see if there is anything abnormal. Laparoscopy can be done either in a hospital or outpatient clinic. You may be given a mild sedative to help you relax before the procedure. Once in the operating room, you will be given a drug to make you sleep (general anesthesia). Laparoscopy usually lasts about 1 hour. After the procedure, you will be monitored in a recovery area until you are stable and doing well. Once you are home, it may take 3 to 7 days to fully recover.  Laparoscopy has relatively few risks. Your caregiver will discuss the risks with you before the procedure. Some problems that can occur include: RISKS AND COMPLICATIONS  Allergies to medicines. Difficulty breathing. Bleeding. Infection. Damage to other surrounding structures HOME CARE INSTRUCTIONS  Infection. Bleeding. Damage to other organs. Anesthetic side effects.  Need for additional procedures such as open procedures/laparotomy PROCEDURE Once you receive anesthesia, your surgeon inflates the abdomen with a harmless gas (carbon dioxide). This makes the organs easier to see. The laparoscope is inserted into the abdomen through a small incision. This allows your surgeon to see into the abdomen. Other small instruments are also inserted into the abdomen through other small openings. Many surgeons attach a video camera to the laparoscope to enlarge the view. During a laparoscopy, the surgeon may be looking for inflammation, infection, or cancer.  The surgeon may also need to take out certain organs or  take tissue samples (biopsies). The specimens are sent to a specialist in looking at cells and tissue samples (pathologist). The pathologist examines them under a microscope to help to diagnose or confirm a disease. AFTER THE PROCEDURE  The incisions are closed with stitches (sutures) and Dermabond. Because these incisions are small (usually less than 1/2 inch), there is usually minimal discomfort after the procedure. There may also be discomfort from the instrument placement incisions in the abdomen. You will be given pain medicine to ease any discomfort. You will rest in a recovery room for 1-2 hours until you are stable and doing well. You may have some mild discomfort in the throat. This is from the tube placed in your throat while you were sleeping. You may experience discomfort in the shoulder area from some trapped air between the liver and diaphragm. This sensation is normal and will slowly go away on its own. The recovery time is shortened as long as there are no complications. You will rest in a recovery room until stable and doing well. As long as there are no complications, you may be allowed to go home. Someone will need to drive you home and be with you for at least 24 hours once home. FINDING OUT THE RESULTS You will be called with the results of the pathology and will discuss these results with  your caregiver during your postoperative appointment. Do not assume everything is normal if you have not heard from your caregiver or the medical facility. It is important for you to follow up on all of your results. HOME CARE INSTRUCTIONS  Take all medicines as directed. Only take over-the-counter or prescription medicines for pain, discomfort,   CARE INSTRUCTIONS   Take all medicines as directed.  Only take over-the-counter or prescription medicines for pain, discomfort, or fever as directed by your caregiver.  Resume daily activities as directed.  Showers are preferred over baths.  You may resume sexual activities in 1 week or as directed.  Do not drive while taking  narcotics. SEEK MEDICAL CARE IF:  There is increasing abdominal pain.  You feel lightheaded or faint.  You have the chills.  You have an oral temperature above 102 F (38.9 C).  There is pus-like (purulent) drainage from any of the wounds.  You are unable to pass gas or have a bowel movement.  You feel sick to your stomach (nauseous) or throw up (vomit). MAKE SURE YOU:   Understand these instructions.  Will watch your condition.  Will get help right away if you are not doing well or get worse.  ExitCare Patient Information 2013 [place].  Ruptured Ectopic Pregnancy An ectopic pregnancy is when the fertilized egg attaches (implants) outside the uterus. Most ectopic pregnancies occur in the fallopian tube. Rarely do ectopic pregnancies occur on the ovary, intestine, pelvis, or cervix. An ectopic pregnancy does not have the ability to develop into a normal, healthy baby.  A ruptured ectopic pregnancy is one in which the fallopian tube gets torn or bursts and results in internal bleeding. Often there is intense abdominal pain, and sometimes, vaginal bleeding. Having an ectopic pregnancy can be a life-threatening experience. If left untreated, this dangerous condition can lead to a blood transfusion, abdominal surgery, or even death.  CAUSES  Damage to the fallopian tubes is the suspected cause in most ectopic pregnancies.  RISK FACTORS Depending on your circumstances, the amount of risk of having an ectopic pregnancy will vary. There are 3 categories that may help you identify whether you are potentially at risk. High Risk  You have gone through infertility treatment.  You have had a previous ectopic pregnancy.  You have had previous tubal surgery.  You have had previous surgery to have the fallopian tubes tied (tubal ligation).  You have tubal problems or diseases.  You have been exposed to DES. DES is a medicine that was used until 1971 and had effects on babies  whose mothers took the medicine.  You become pregnant while using an intrauterine device (IUD) for birth control. Moderate Risk  You have a history of infertility.  You have a history of a sexually transmitted infection (STI).  You have a history of pelvic inflammatory disease (PID).  You have scarring from endometriosis.  You have multiple sexual partners.  You smoke. Low Risk  You have had previous pelvic surgery.  You use vaginal douching.  You became sexually active before 36 years of age. SYMPTOMS An ectopic pregnancy should be suspected in anyone who has missed a period and has abdominal pain or bleeding.  You may experience normal pregnancy symptoms, such as:  Nausea.  Tiredness.  Breast tenderness.  Symptoms that are not normal include:  Pain with intercourse.  Irregular vaginal bleeding or spotting.  Cramping or pain on one side, or in the lower abdomen.  Fast heartbeat.  Passing out while having a bowel movement.  Symptoms of a ruptured ectopic pregnancy and internal bleeding may include:  Sudden, severe pain in the abdomen and pelvis.  Dizziness or fainting.  Pain in the shoulder area. DIAGNOSIS  Tests that may be performed include:  A pregnancy test.  An ultrasound.  Testing the specific level of  pregnancy hormone in the bloodstream.  Taking a sample of uterus tissue (dilation and curettage, D&C).  Surgery to perform a visual exam of the inside of the abdomen using a lighted tube (laparoscopy). TREATMENT  Laparoscopic surgery or abdominal surgery is recommended for a ruptured ectopic pregnancy.   The whole fallopian tube may need to be removed (salpingectomy).  If the tube is not too damaged, the tube may be saved, and the pregnancy will be surgically removed. Intime, the tube may still function.  If you have lost a lot of blood, you may need a blood transfusion.  You may receive a Rho (D) immune globulin shot if you are Rh  negative and the father is Rh positive, or if you do not know the Rh type of the father. This is to prevent problems with any future pregnancy. SEEK IMMEDIATE MEDICAL CARE IF:  You have any symptoms of an ectopic or ruptured ectopic pregnancy. This is a medical emergency. MAKE SURE YOU:  Understand these instructions.  Will watch your condition.  Will get help right away if you are not doing well or get worse.   This information is not intended to replace advice given to you by your health care provider. Make sure you discuss any questions you have with your health care provider.   Document Released: 07/16/2000 Document Revised: 07/24/2013 Document Reviewed: 04/30/2013 Elsevier Interactive Patient Education 2016 [place] INSTRUCTIONS: Laparoscopy  The following instructions have been prepared to help you care for yourself upon your return home today.  Wound care:  Do not get the incision wet for the first 24 hours. The incision should be kept clean and dry.  The Band-Aids or dressings may be removed the day after surgery.  Should the incision become sore, red, and swollen after the first week, check with your doctor.  Personal hygiene:  Shower the day after your procedure.  Activity and limitations:  Do NOT drive or operate any equipment today.  Do NOT lift anything more than 15 pounds for 2-3 weeks after surgery.  Do NOT rest in bed all day.  Walking is encouraged. Walk each day, starting slowly with 5-minute walks 3 or 4 times a day. Slowly increase the length of your walks.  Walk up and down stairs slowly.  Do NOT do strenuous activities, such as golfing, playing tennis, bowling, running, biking, weight lifting, gardening, mowing, or vacuuming for 2-4 weeks. Ask your doctor when it is okay to start.  Diet: Eat a light meal as desired this evening. You may resume your usual diet tomorrow.  Return to work: This is dependent on the type of work you do.  For the most part you can return to a desk job within a week of surgery. If you are more active at work, please discuss this with your doctor.  What to expect after your surgery: You may have a slight burning sensation when you urinate on the first day. You may have a very small amount of blood in the urine. Expect to have a small amount of vaginal discharge/light bleeding for 1-2 weeks. It is not unusual to have abdominal soreness and bruising for up to 2 weeks. You may be tired and need more rest for about 1 week. You may experience shoulder pain for 24-72 hours. Lying flat in bed may relieve it.  Call your doctor for any of the following:  Develop a fever of 100.4 or greater  Inability to urinate 6 hours after discharge from  hospital  Severe pain not relieved by pain medications  Persistent of heavy bleeding at incision site  Redness or swelling around incision site after a week  Increasing nausea or vomiting  Patient Signature________________________________________ Nurse Signature_________________________________________   Post Anesthesia Home Care Instructions  NO IBUPROFEN PRODUCTS UNTIL: 10:30 TONIGHT  Activity: Get plenty of rest for the remainder of the day. A responsible adult should stay with you for 24 hours following the procedure.  For the next 24 hours, DO NOT: -Drive a car -Paediatric nurse -Drink alcoholic beverages -Take any medication unless instructed by your physician -Make any legal decisions or sign important papers.  Meals: Start with liquid foods such as gelatin or soup. Progress to regular foods as tolerated. Avoid greasy, spicy, heavy foods. If nausea and/or vomiting occur, drink only clear liquids until the nausea and/or vomiting subsides. Call your physician if vomiting continues.  Special Instructions/Symptoms: Your throat may feel dry or sore from the anesthesia or the breathing tube placed in your throat during surgery. If this causes  discomfort, gargle with warm salt water. The discomfort should disappear within 24 hours.  If you had a scopolamine patch placed behind your ear for the management of post- operative nausea and/or vomiting:  1. The medication in the patch is effective for 72 hours, after which it should be removed.  Wrap patch in a tissue and discard in the trash. Wash hands thoroughly with soap and water. 2. You may remove the patch earlier than 72 hours if you experience unpleasant side effects which may include dry mouth, dizziness or visual disturbances. 3. Avoid touching the patch. Wash your hands with soap and water after contact with the patch.    Post Anesthesia Home Care Instructions  Activity: Get plenty of rest for the remainder of the day. A responsible adult should stay with you for 24 hours following the procedure.  For the next 24 hours, DO NOT: -Drive a car -Paediatric nurse -Drink alcoholic beverages -Take any medication unless instructed by your physician -Make any legal decisions or sign important papers.  Meals: Start with liquid foods such as gelatin or soup. Progress to regular foods as tolerated. Avoid greasy, spicy, heavy foods. If nausea and/or vomiting occur, drink only clear liquids until the nausea and/or vomiting subsides. Call your physician if vomiting continues.  Special Instructions/Symptoms: Your throat may feel dry or sore from the anesthesia or the breathing tube placed in your throat during surgery. If this causes discomfort, gargle with warm salt water. The discomfort should disappear within 24 hours.  If you had a scopolamine patch placed behind your ear for the management of post- operative nausea and/or vomiting:  1. The medication in the patch is effective for 72 hours, after which it should be removed.  Wrap patch in a tissue and discard in the trash. Wash hands thoroughly with soap and water. 2. You may remove the patch earlier than 72 hours if you experience  unpleasant side effects which may include dry mouth, dizziness or visual disturbances. 3. Avoid touching the patch. Wash your hands with soap and water after contact with the patch.

## 2016-06-09 NOTE — MAU Note (Signed)
Pt sent up from clinic, add-on surgical case.

## 2016-06-09 NOTE — Anesthesia Procedure Notes (Signed)
Procedure Name: Intubation Date/Time: 06/09/2016 3:10 PM Performed by: Casimer Lanius A Pre-anesthesia Checklist: Patient identified, Emergency Drugs available, Suction available and Patient being monitored Patient Re-evaluated:Patient Re-evaluated prior to inductionOxygen Delivery Method: Circle system utilized and Simple face mask Preoxygenation: Pre-oxygenation with 100% oxygen Intubation Type: IV induction and Inhalational induction Ventilation: Mask ventilation without difficulty Laryngoscope Size: Mac and 3 Grade View: Grade I Tube type: Oral Tube size: 7.0 mm Number of attempts: 1 Airway Equipment and Method: Stylet and Patient positioned with wedge pillow Placement Confirmation: ETT inserted through vocal cords under direct vision,  positive ETCO2 and breath sounds checked- equal and bilateral Secured at: 20 (right lip) cm Tube secured with: Tape Dental Injury: Teeth and Oropharynx as per pre-operative assessment

## 2016-06-09 NOTE — Transfer of Care (Signed)
Immediate Anesthesia Transfer of Care Note  Patient: Denise Moses  Procedure(s) Performed: Procedure(s) with comments: LAPAROSCOPY OPERATIVE (N/A) - ectopic  UNILATERAL SALPINGECTOMY, REMOVAL OF IUD (Left)  Patient Location: PACU  Anesthesia Type:General  Level of Consciousness: sedated  Airway & Oxygen Therapy: Patient Spontanous Breathing and Patient connected to nasal cannula oxygen  Post-op Assessment: Report given to RN  Post vital signs: Reviewed and stable  Last Vitals:  Vitals:   06/09/16 1255  BP: 126/61  Pulse: 60  Resp: 16  Temp: 37.3 C    Last Pain:  Vitals:   06/09/16 1445  TempSrc:   PainSc: 8          Complications: No apparent anesthesia complications

## 2016-06-10 ENCOUNTER — Encounter (HOSPITAL_COMMUNITY): Payer: Self-pay | Admitting: Obstetrics & Gynecology

## 2016-06-10 LAB — RPR: RPR Ser Ql: NONREACTIVE

## 2016-07-02 ENCOUNTER — Ambulatory Visit: Payer: Medicaid Other | Admitting: Obstetrics & Gynecology

## 2016-12-15 ENCOUNTER — Encounter: Payer: Self-pay | Admitting: Family Medicine

## 2017-01-13 ENCOUNTER — Other Ambulatory Visit: Payer: Medicaid Other | Admitting: Family Medicine

## 2017-04-12 ENCOUNTER — Emergency Department (HOSPITAL_COMMUNITY)
Admission: EM | Admit: 2017-04-12 | Discharge: 2017-04-12 | Disposition: A | Discharge status: Home / Self Care | Payer: Medicaid Other | Attending: Emergency Medicine | Admitting: Emergency Medicine

## 2017-04-12 ENCOUNTER — Encounter (HOSPITAL_COMMUNITY): Payer: Self-pay | Admitting: Nurse Practitioner

## 2017-04-12 DIAGNOSIS — R51 Headache: Secondary | ICD-10-CM | POA: Insufficient documentation

## 2017-04-12 DIAGNOSIS — R42 Dizziness and giddiness: Secondary | ICD-10-CM | POA: Insufficient documentation

## 2017-04-12 DIAGNOSIS — R110 Nausea: Secondary | ICD-10-CM | POA: Insufficient documentation

## 2017-04-12 DIAGNOSIS — H53149 Visual discomfort, unspecified: Secondary | ICD-10-CM | POA: Insufficient documentation

## 2017-04-12 DIAGNOSIS — Z5321 Procedure and treatment not carried out due to patient leaving prior to being seen by health care provider: Secondary | ICD-10-CM | POA: Diagnosis not present

## 2017-04-12 NOTE — ED Notes (Signed)
No answer x 4  

## 2017-04-12 NOTE — ED Triage Notes (Signed)
Pt presents with c/o headache. The headache is a throbbing pain across her forehead. The headache began yesterday. She c/o dizziness, photophobia, nausea. She tired rest, ibuprofen with no improvement

## 2017-06-03 ENCOUNTER — Encounter: Payer: Self-pay | Admitting: Family Medicine

## 2017-06-30 ENCOUNTER — Encounter: Payer: Self-pay | Admitting: Family Medicine

## 2017-06-30 ENCOUNTER — Ambulatory Visit: Payer: Medicaid Other | Attending: Family Medicine | Admitting: Family Medicine

## 2017-06-30 VITALS — BP 122/79 | HR 69 | Temp 97.4°F | Resp 16 | Ht 66.0 in | Wt 193.4 lb

## 2017-06-30 DIAGNOSIS — L700 Acne vulgaris: Secondary | ICD-10-CM

## 2017-06-30 DIAGNOSIS — Z0000 Encounter for general adult medical examination without abnormal findings: Secondary | ICD-10-CM | POA: Diagnosis not present

## 2017-06-30 DIAGNOSIS — F17210 Nicotine dependence, cigarettes, uncomplicated: Secondary | ICD-10-CM | POA: Insufficient documentation

## 2017-06-30 DIAGNOSIS — R011 Cardiac murmur, unspecified: Secondary | ICD-10-CM | POA: Diagnosis not present

## 2017-06-30 DIAGNOSIS — Z79899 Other long term (current) drug therapy: Secondary | ICD-10-CM | POA: Diagnosis not present

## 2017-06-30 DIAGNOSIS — L729 Follicular cyst of the skin and subcutaneous tissue, unspecified: Secondary | ICD-10-CM

## 2017-06-30 MED ORDER — DAPSONE 5 % EX GEL: 1.0000 "application " | Freq: Two times a day (BID) | CUTANEOUS | 1 refills | Status: DC

## 2017-06-30 MED ORDER — CETAPHIL GENTLE CLEANSER EX LIQD: 1.0000 "application " | Freq: Two times a day (BID) | CUTANEOUS | 0 refills | Status: DC

## 2017-06-30 MED ORDER — DOXYCYCLINE HYCLATE 100 MG PO TABS: 100.0000 mg | ORAL_TABLET | Freq: Two times a day (BID) | ORAL | 0 refills | Status: DC

## 2017-06-30 NOTE — Progress Notes (Signed)
Subjective:   Patient ID: Denise Moses, female    DOB: 07-Dec-1980, 36 y.o.   MRN: [id number]  Chief Complaint  Patient presents with  . Annual Exam   HPI Denise Moses 36 y.o. female presents to establish care and for comprehensive physical examination.  No significant past medical history.  She complains of recurrent boil to left buttock.  Onset 1 year ago.  She reports spontaneous drainage after a few months but recurrence thereafter.  Associated symptoms include tenderness and pain.  She denies taking anything for symptoms.  Acne: Symptoms have gradually worsened. Lesions are described as nodules, scars and superficial pustules. Acne is primarily located on the face.  She denies currently taking  anything for symptoms.  She denies family history of diabetes, heart disease, or hypertension.  She reports family history of cancer-mother, paternal aunt, maternal l grandfather.  She is a current smoker she reports smoking 3-4 cigarettes/day.  She denies any chest pain, dyspnea, or abdominal pain. Denies symptoms of all other pertinent systems.    Past Medical History:  Diagnosis Date  . Heart murmur   . Herpes   . Pregnancy induced hypertension    2001    Past Surgical History:  Procedure Laterality Date  . CHOLECYSTECTOMY    . LAPAROSCOPY N/A 06/09/2016   Procedure: LAPAROSCOPY OPERATIVE;  Surgeon: Osborne Oman, MD;  Location: [facility];  Service: Gynecology;  Laterality: N/A;  ectopic   . THERAPEUTIC ABORTION     x3  . UNILATERAL SALPINGECTOMY Left 06/09/2016   Procedure: UNILATERAL SALPINGECTOMY, REMOVAL OF IUD;  Surgeon: Osborne Oman, MD;  Location: [facility];  Service: Gynecology;  Laterality: Left;    Family History  Problem Relation Age of Onset  . Breast cancer Mother 65       was a smoker, alcohol drinker in early life   . Breast cancer Maternal Aunt   . Cancer Maternal Grandfather   . Breast cancer Maternal Aunt     Social History   Socioeconomic  History  . Marital status: Single    Spouse name: Not on file  . Number of children: Not on file  . Years of education: Not on file  . Highest education level: Not on file  Social Needs  . Financial resource strain: Not on file  . Food insecurity - worry: Not on file  . Food insecurity - inability: Not on file  . Transportation needs - medical: Not on file  . Transportation needs - non-medical: Not on file  Occupational History  . Not on file  Tobacco Use  . Smoking status: Current Every Day Smoker    Packs/day: 0.25    Years: 10.00    Pack years: 2.50    Types: Cigarettes  . Smokeless tobacco: Never Used  . Tobacco comment: 2-3 cigs/day  Substance and Sexual Activity  . Alcohol use: No    Comment: social drinker  . Drug use: Yes    Types: Marijuana  . Sexual activity: Not Currently  Other Topics Concern  . Not on file  Social History Narrative  . Not on file    Outpatient Medications Prior to Visit  Medication Sig Dispense Refill  . Acetaminophen (TYLENOL PO) Take 2 tablets by mouth every 6 (six) hours as needed (pain).    [name] docusate sodium (COLACE) 100 MG capsule Take 1 capsule (100 mg total) by mouth 2 (two) times daily as needed. (Patient not taking: Reported on 06/30/2017) 30 capsule 2  .  ibuprofen (ADVIL,MOTRIN) 600 MG tablet Take 1 tablet (600 mg total) by mouth every 6 (six) hours as needed. (Patient not taking: Reported on 06/30/2017) 60 tablet 3  . oxyCODONE-acetaminophen (PERCOCET/ROXICET) 5-325 MG tablet Take 1-2 tablets by mouth every 6 (six) hours as needed. (Patient not taking: Reported on 06/30/2017) 30 tablet 0   No facility-administered medications prior to visit.     No Known Allergies  Review of Systems  Constitutional: Negative.   HENT: Negative.   Eyes: Negative.   Respiratory: Negative.   Cardiovascular: Negative.   Gastrointestinal: Negative.   Genitourinary: Negative.   Musculoskeletal: Negative.   Skin:       Lesion to buttock. Face  lesions.  Neurological: Negative.   Endo/Heme/Allergies: Negative.   Psychiatric/Behavioral: Negative.        Objective:    Physical Exam  Constitutional: She is oriented to person, place, and time. She appears well-developed and well-nourished.  HENT:  Head: Normocephalic and atraumatic.  Right Ear: External ear normal.  Left Ear: External ear normal.  Nose: Nose normal.  Mouth/Throat: Oropharynx is clear and moist.  Eyes: Conjunctivae and EOM are normal. Pupils are equal, round, and reactive to light.  Neck: Normal range of motion. Neck supple.  Cardiovascular: Normal rate, regular rhythm, normal heart sounds and intact distal pulses.  Pulmonary/Chest: Effort normal and breath sounds normal.  Abdominal: Soft. Bowel sounds are normal. There is no tenderness.  Musculoskeletal: Normal range of motion.  Neurological: She is alert and oriented to person, place, and time. She has normal reflexes.  Skin: Skin is warm and dry. Lesion noted.     Papules and small cyst scattered throughout face, with areas of hyperpigmentation and scarring present.  Psychiatric: She has a normal mood and affect.  Nursing note and vitals reviewed.   BP 122/79 (BP Location: Left Arm, Patient Position: Sitting, Cuff Size: Normal)   Pulse 69   Temp (!) 97.4 F (36.3 C) (Oral)   Resp 16   Ht 5\' 6"  (1.676 m)   Wt 193 lb 6.4 oz (87.7 kg)   LMP 06/30/2017   SpO2 100%   BMI 31.22 kg/m  Wt Readings from Last 3 Encounters:  06/30/17 193 lb 6.4 oz (87.7 kg)  06/09/16 200 lb (90.7 kg)  06/09/16 199 lb 3.2 oz (90.4 kg)    There is no immunization history for the selected administration types on file for this patient.    Lab Results  Component Value Date   TSH 0.974 06/30/2017   Lab Results  Component Value Date   WBC 9.1 06/30/2017   HGB 10.2 (L) 06/30/2017   HCT 33.0 (L) 06/30/2017   MCV 82 06/30/2017   PLT 336 06/30/2017   Lab Results  Component Value Date   NA 140 06/30/2017   K 3.7  06/30/2017   CO2 22 06/30/2017   GLUCOSE 72 06/30/2017   BUN 8 06/30/2017   CREATININE 0.73 06/30/2017   BILITOT 0.3 06/30/2017   ALKPHOS 73 06/30/2017   AST 9 06/30/2017   ALT 10 06/30/2017   PROT 7.5 06/30/2017   ALBUMIN 4.5 06/30/2017   CALCIUM 9.7 06/30/2017   ANIONGAP 6 06/07/2016   Lab Results  Component Value Date   CHOL 146 06/30/2017   Lab Results  Component Value Date   HDL 54 06/30/2017   Lab Results  Component Value Date   LDLCALC 70 06/30/2017   Lab Results  Component Value Date   TRIG 110 06/30/2017   Lab Results  Component Value Date   CHOLHDL 2.7 06/30/2017   Lab Results  Component Value Date   HGBA1C 5.3 06/30/2017       Assessment & Plan:   1. Annual physical exam  - CMP and Liver - CBC - Lipid Panel - TSH - Hemoglobin A1c - Vitamin D, 25-hydroxy  2. Cyst of buttocks  - Ambulatory referral to General Surgery  3. Cystic acne - Dapsone 5 % topical gel; Apply 1 application topically 2 (two) times daily. - Soap & Cleansers (CETAPHIL GENTLE CLEANSER) LIQD; Apply 1 application topically 2 (two) times daily. Use as directed.  Dispense: 237 mL; Refill: 0 - doxycycline (VIBRA-TABS) 100 MG tablet; Take 1 tablet (100 mg total) by mouth 2 (two) times daily.  Dispense: 60 tablet; Refill: 0      Follow up: Return in about 1 month (around 07/30/2017) for PAP.    Fredia Beets, FNP  `

## 2017-06-30 NOTE — Progress Notes (Signed)
Patient is here for a physical. Patient would like to talk about breaks out on her face she is having.

## 2017-06-30 NOTE — Patient Instructions (Signed)
Acne  Acne is a skin problem that causes small, red bumps (pimples). Acne happens when the tiny holes in your skin (pores) get blocked. Your pores may become red, sore, and swollen. They may also become infected. Acne is a common skin problem. It is especially common in teenagers. Acne usually goes away over time.  Follow these instructions at home:  Good skin care is the most important thing you can do to treat your acne. Take care of your skin as told by your doctor. You may be told to do these things:  · Wash your skin gently at least two times each day. You should also wash your skin:  ? After you exercise.  ? Before you go to bed.  · Use mild soap.  · Use a water-based skin moisturizer after you wash your skin.  · Use a sunscreen or sunblock with SPF 30 or greater. This is very important if you are using acne medicines.  · Choose cosmetics that will not plug your oil glands (are noncomedogenic).    Medicines  · Take over-the-counter and prescription medicines only as told by your doctor.  · If you were prescribed an antibiotic medicine, apply or take it as told by your doctor. Do not stop using the antibiotic even if your acne improves.  General instructions  · Keep your hair clean and off of your face. Shampoo your hair regularly. If you have oily hair, you may need to wash it every day.  · Avoid leaning your chin or forehead on your hands.  · Avoid wearing tight headbands or hats.  · Avoid picking or squeezing your pimples. That can make your acne worse and cause scarring.  · Keep all follow-up visits as told by your doctor. This is important.  · Shave gently. Only shave when it is necessary.  · Keep a food journal. This can help you to see if any foods are linked with your acne.  Contact a doctor if:  · Your acne is not better after eight weeks.  · Your acne gets worse.  · You have a large area of skin that is red or tender.  · You think that you are having side effects from any acne medicine.  This  information is not intended to replace advice given to you by your health care provider. Make sure you discuss any questions you have with your health care provider.  Document Released: 07/08/2011 Document Revised: 12/25/2015 Document Reviewed: 09/25/2014  Elsevier Interactive Patient Education © 2018 Elsevier Inc.

## 2017-07-01 ENCOUNTER — Telehealth: Payer: Self-pay | Admitting: Family Medicine

## 2017-07-01 LAB — LIPID PANEL
CHOL/HDL RATIO: 2.7 ratio (ref 0.0–4.4)
Cholesterol, Total: 146 mg/dL (ref 100–199)
HDL: 54 mg/dL (ref >39)
LDL CALC: 70 mg/dL (ref 0–99)
Triglycerides: 110 mg/dL (ref 0–149)
VLDL CHOLESTEROL CAL: 22 mg/dL (ref 5–40)

## 2017-07-01 LAB — CMP AND LIVER
ALBUMIN: 4.5 g/dL (ref 3.5–5.5)
ALT: 10 IU/L (ref 0–32)
AST: 9 IU/L (ref 0–40)
Alkaline Phosphatase: 73 IU/L (ref 39–117)
BUN: 8 mg/dL (ref 6–20)
Bilirubin Total: 0.3 mg/dL (ref 0.0–1.2)
Bilirubin, Direct: 0.09 mg/dL (ref 0.00–0.40)
CO2: 22 mmol/L (ref 20–29)
CREATININE: 0.73 mg/dL (ref 0.57–1.00)
Calcium: 9.7 mg/dL (ref 8.7–10.2)
Chloride: 103 mmol/L (ref 96–106)
GFR calc Af Amer: 122 mL/min/{1.73_m2} (ref >59)
GFR, EST NON AFRICAN AMERICAN: 106 mL/min/{1.73_m2} (ref >59)
GLUCOSE: 72 mg/dL (ref 65–99)
Potassium: 3.7 mmol/L (ref 3.5–5.2)
Sodium: 140 mmol/L (ref 134–144)
Total Protein: 7.5 g/dL (ref 6.0–8.5)

## 2017-07-01 LAB — CBC
HEMATOCRIT: 33 % — AB (ref 34.0–46.6)
HEMOGLOBIN: 10.2 g/dL — AB (ref 11.1–15.9)
MCH: 25.2 pg — AB (ref 26.6–33.0)
MCHC: 30.9 g/dL — ABNORMAL LOW (ref 31.5–35.7)
MCV: 82 fL (ref 79–97)
Platelets: 336 10*3/uL (ref 150–379)
RBC: 4.05 x10E6/uL (ref 3.77–5.28)
RDW: 17.4 % — ABNORMAL HIGH (ref 12.3–15.4)
WBC: 9.1 10*3/uL (ref 3.4–10.8)

## 2017-07-01 LAB — HEMOGLOBIN A1C
Est. average glucose Bld gHb Est-mCnc: 105 mg/dL
HEMOGLOBIN A1C: 5.3 % (ref 4.8–5.6)

## 2017-07-01 LAB — VITAMIN D 25 HYDROXY (VIT D DEFICIENCY, FRACTURES): Vit D, 25-Hydroxy: 19.9 ng/mL — ABNORMAL LOW (ref 30.0–100.0)

## 2017-07-01 LAB — TSH: TSH: 0.974 u[IU]/mL (ref 0.450–4.500)

## 2017-07-01 NOTE — Telephone Encounter (Signed)
Pt called to try to get another medication since Dapsone 5 % topical gel  Is not cover by the medicaid, can you please sent something else that can help her but is cover by her insurance, please follow up

## 2017-07-04 ENCOUNTER — Other Ambulatory Visit: Payer: Self-pay | Admitting: Family Medicine

## 2017-07-04 DIAGNOSIS — L700 Acne vulgaris: Secondary | ICD-10-CM

## 2017-07-04 MED ORDER — ADAPALENE 0.1 % EX GEL: Freq: Every day | CUTANEOUS | 1 refills | Status: DC

## 2017-07-04 NOTE — Telephone Encounter (Signed)
Prescribed adapalene (DIFFERIN) 0.1 % gel. Prescription sent to [facility] on file. She may be able to get this medication over the counter.

## 2017-07-05 ENCOUNTER — Other Ambulatory Visit: Payer: Self-pay | Admitting: Family Medicine

## 2017-07-05 DIAGNOSIS — E559 Vitamin D deficiency, unspecified: Secondary | ICD-10-CM

## 2017-07-05 DIAGNOSIS — D509 Iron deficiency anemia, unspecified: Secondary | ICD-10-CM | POA: Insufficient documentation

## 2017-07-05 MED ORDER — VITAMIN D (ERGOCALCIFEROL) 1.25 MG (50000 UNIT) PO CAPS: ORAL_CAPSULE | ORAL | 0 refills | Status: DC

## 2017-07-05 MED ORDER — FERROUS SULFATE 325 (65 FE) MG PO TBEC: 325.0000 mg | DELAYED_RELEASE_TABLET | Freq: Three times a day (TID) | ORAL | 2 refills | Status: DC

## 2017-07-05 NOTE — Telephone Encounter (Signed)
-----   Message from Alfonse Spruce, [place] sent at 07/05/2017  7:39 AM EST ----- Labs that evaluated your fluid and electrolyte balance are normal.  Blood cell labs indicate iron deficiency anemia. You will be prescribed iron supplement. Increase water and fiber intake to prevent constipation. Recommend f/u in 3 months. -Increase your dietary iron intake. Good sources of iron include dark green leafy vegetables, meats, beans, and iron fortified cereals. Cholesterol levels are normal. Thyroid function normal Kidney function normal Liver function normal Vitamin D level was low. Vitamin D helps to keep bones strong. You were prescribed ergocalciferol (capsules) to increase your vitamin-d level. Once finished start taking OTC vitamin d supplement with 800 international units (IU) of vitamin-d per day.

## 2017-07-05 NOTE — Telephone Encounter (Signed)
CMA call regarding lab results   Patient Verify DOB   Patient was aware and understood  

## 2017-07-20 ENCOUNTER — Other Ambulatory Visit (HOSPITAL_COMMUNITY)
Admission: RE | Admit: 2017-07-20 | Discharge: 2017-07-20 | Disposition: A | Discharge status: Home / Self Care | Payer: Medicaid Other | Source: Ambulatory Visit | Attending: Family Medicine | Admitting: Family Medicine

## 2017-07-20 ENCOUNTER — Ambulatory Visit: Payer: Medicaid Other | Attending: Family Medicine | Admitting: Family Medicine

## 2017-07-20 ENCOUNTER — Encounter: Payer: Self-pay | Admitting: Family Medicine

## 2017-07-20 VITALS — BP 114/77 | HR 81 | Temp 98.4°F | Resp 16 | Wt 192.8 lb

## 2017-07-20 DIAGNOSIS — Z01419 Encounter for gynecological examination (general) (routine) without abnormal findings: Secondary | ICD-10-CM | POA: Insufficient documentation

## 2017-07-20 DIAGNOSIS — Z01411 Encounter for gynecological examination (general) (routine) with abnormal findings: Secondary | ICD-10-CM | POA: Diagnosis present

## 2017-07-20 DIAGNOSIS — Z803 Family history of malignant neoplasm of breast: Secondary | ICD-10-CM | POA: Diagnosis not present

## 2017-07-20 DIAGNOSIS — B009 Herpesviral infection, unspecified: Secondary | ICD-10-CM | POA: Insufficient documentation

## 2017-07-20 DIAGNOSIS — B9689 Other specified bacterial agents as the cause of diseases classified elsewhere: Secondary | ICD-10-CM | POA: Diagnosis not present

## 2017-07-20 DIAGNOSIS — N760 Acute vaginitis: Secondary | ICD-10-CM | POA: Diagnosis not present

## 2017-07-20 DIAGNOSIS — Z113 Encounter for screening for infections with a predominantly sexual mode of transmission: Secondary | ICD-10-CM | POA: Diagnosis not present

## 2017-07-20 DIAGNOSIS — N898 Other specified noninflammatory disorders of vagina: Secondary | ICD-10-CM | POA: Diagnosis not present

## 2017-07-20 MED ORDER — METRONIDAZOLE 500 MG PO TABS: 500.0000 mg | ORAL_TABLET | Freq: Two times a day (BID) | ORAL | 0 refills | Status: DC

## 2017-07-20 NOTE — Progress Notes (Signed)
Subjective:  Patient ID: Denise Moses, female    DOB: 07/02/1981  Age: 36 y.o. MRN: [id number]  CC: Gynecologic Exam   HPI Franca Stakes presents for well woman visit with pap. She reports  family history of breast cancer- maternal aunt. She denies family history of  gynecological cancers. She is not a current smoker.  She denies any lumps, denting or dimpling of the breast, or nipple discharge. She does perform monthly SBE. She denies vaginal lesions, discharge, or dysuria. She is agreeable to STI testing with pap. She reports no sexual partners within the last 3 months.    Outpatient Medications Prior to Visit  Medication Sig Dispense Refill  . Acetaminophen (TYLENOL PO) Take 2 tablets by mouth every 6 (six) hours as needed (pain).    [name] adapalene (DIFFERIN) 0.1 % gel Apply topically at bedtime. Use as directed. 45 g 1  . docusate sodium (COLACE) 100 MG capsule Take 1 capsule (100 mg total) by mouth 2 (two) times daily as needed. (Patient not taking: Reported on 06/30/2017) 30 capsule 2  . doxycycline (VIBRA-TABS) 100 MG tablet Take 1 tablet (100 mg total) by mouth 2 (two) times daily. 60 tablet 0  . ferrous sulfate 325 (65 FE) MG EC tablet Take 1 tablet (325 mg total) by mouth 3 (three) times daily with meals. 90 tablet 2  . ibuprofen (ADVIL,MOTRIN) 600 MG tablet Take 1 tablet (600 mg total) by mouth every 6 (six) hours as needed. (Patient not taking: Reported on 06/30/2017) 60 tablet 3  . oxyCODONE-acetaminophen (PERCOCET/ROXICET) 5-325 MG tablet Take 1-2 tablets by mouth every 6 (six) hours as needed. (Patient not taking: Reported on 06/30/2017) 30 tablet 0  . Soap & Cleansers (CETAPHIL GENTLE CLEANSER) LIQD Apply 1 application topically 2 (two) times daily. Use as directed. 237 mL 0  . Vitamin D, Ergocalciferol, (DRISDOL) 50000 units CAPS capsule TAKE ONE CAPSULE BY MOUTH ONCE A WEEK. FOR 12 WEEKS. 12 capsule 0   No facility-administered medications prior to visit.      ROS Review of Systems  Constitutional: Negative.   Respiratory: Negative.   Cardiovascular: Negative.   Gastrointestinal: Negative.   Genitourinary: Negative.   Skin: Negative.   Psychiatric/Behavioral: Negative.    Objective:  BP 114/77   Pulse 81   Temp 98.4 F (36.9 C) (Oral)   Resp 16   Wt 192 lb 12.8 oz (87.5 kg)   LMP 06/30/2017   SpO2 99%   BMI 31.12 kg/m   BP/Weight 07/20/2017 06/30/2017 0/04/3817  Systolic BP 299 371 696  Diastolic BP 77 79 73  Wt. (Lbs) 192.8 193.4 -  BMI 31.12 31.22 -    Physical Exam  Constitutional: She appears well-developed and well-nourished.  Cardiovascular: Normal rate, regular rhythm, normal heart sounds and intact distal pulses.  Pulmonary/Chest: Effort normal and breath sounds normal. Right breast exhibits no mass, no nipple discharge, no skin change and no tenderness. Left breast exhibits no mass, no nipple discharge, no skin change and no tenderness. Breasts are symmetrical.  Abdominal: Soft. Bowel sounds are normal. There is no tenderness.  Genitourinary: Cervix exhibits discharge (white, thin, malodorus). Vaginal discharge found.  Skin: Skin is warm and dry.  Nursing note and vitals reviewed.  Depression screen [facility] 2/9 07/20/2017 06/30/2017 06/09/2016  Decreased Interest 0 0 1  Down, Depressed, Hopeless 0 0 0  PHQ - 2 Score 0 0 1  Altered sleeping 1 1 0  Tired, decreased energy 1 2 1   Change in  appetite 1 2 1   Feeling bad or failure about yourself  0 0 0  Trouble concentrating 0 0 0  Moving slowly or fidgety/restless 0 0 0  Suicidal thoughts 0 0 0  PHQ-9 Score 3 5 3     GAD 7 : Generalized Anxiety Score 07/20/2017 06/30/2017 12/23/2015  Nervous, Anxious, on Edge 0 0 0  Control/stop worrying 0 0 0  Worry too much - different things 1 1 0  Trouble relaxing 0 0 0  Restless 0 0 0  Easily annoyed or irritable 1 1 0  Afraid - awful might happen 0 0 0  Total GAD 7 Score 2 2 0       Assessment & Plan:   1. Well  woman exam with routine gynecological exam  - Cytology - PAP [place] - HEP, RPR, HIV Panel - Cervicovaginal ancillary only  2. Screening examination for STD (sexually transmitted disease)  - HSV(herpes simplex vrs) 1+2 ab-IgG - HEP, RPR, HIV Panel - Cervicovaginal ancillary only  3. Vaginal discharge  - metroNIDAZOLE (FLAGYL) 500 MG tablet; Take 1 tablet (500 mg total) by mouth 2 (two) times daily.  Dispense: 14 tablet; Refill: 0 - Cervicovaginal ancillary only      Follow-up: Return As needed.   Alfonse Spruce FNP

## 2017-07-20 NOTE — Patient Instructions (Signed)
Breast Self-Awareness Breast self-awareness means:  Knowing how your breasts look.  Knowing how your breasts feel.  Checking your breasts every month for changes.  Telling your doctor if you notice a change in your breasts.  Breast self-awareness allows you to notice a breast problem early while it is still small. How to do a breast self-exam One way to learn what is normal for your breasts and to check for changes is to do a breast self-exam. To do a breast self-exam: Look for Changes  1. Take off all the clothes above your waist. 2. Stand in front of a mirror in a room with good lighting. 3. Put your hands on your hips. 4. Push your hands down. 5. Look at your breasts and nipples in the mirror to see if one breast or nipple looks different than the other. Check to see if: ? The shape of one breast is different. ? The size of one breast is different. ? There are wrinkles, dips, and bumps in one breast and not the other. 6. Look at each breast for changes in your skin, such as: ? Redness. ? Scaly areas. 7. Look for changes in your nipples, such as: ? Liquid around the nipples. ? Bleeding. ? Dimpling. ? Redness. ? A change in where the nipples are. Feel for Changes 1. Lie on your back on the floor. 2. Feel each breast. To do this, follow these steps: ? Pick a breast to feel. ? Put the arm closest to that breast above your head. ? Use your other arm to feel the nipple area of your breast. Feel the area with the pads of your three middle fingers by making small circles with your fingers. For the first circle, press lightly. For the second circle, press harder. For the third circle, press even harder. ? Keep making circles with your fingers at the light, harder, and even harder pressures as you move down your breast. Stop when you feel your ribs. ? Move your fingers a little toward the center of your body. ? Start making circles with your fingers again, this time going up until  you reach your collarbone. ? Keep making up and down circles until you reach your armpit. Remember to keep using the three pressures. ? Feel the other breast in the same way. 3. Sit or stand in the shower or tub. 4. With soapy water on your skin, feel each breast the same way you did in step 2, when you were lying on the floor. Write Down What You Find  After doing the self-exam, write down:  What is normal for each breast.  Any changes you find in each breast.  When you last had your period.  How often should I check my breasts? Check your breasts every month. If you are breastfeeding, the best time to check them is after you feed your baby or after you use a breast pump. If you get periods, the best time to check your breasts is 5-7 days after your period is over. When should I see my doctor? See your doctor if you notice:  A change in shape or size of your breasts or nipples.  A change in the skin of your breast or nipples, such as red or scaly skin.  Unusual fluid coming from your nipples.  A lump or thick area that was not there before.  Pain in your breasts.  Anything that concerns you.  This information is not intended to replace advice given to   you by your health care provider. Make sure you discuss any questions you have with your health care provider. Document Released: 01/05/2008 Document Revised: 12/25/2015 Document Reviewed: 06/08/2015 Elsevier Interactive Patient Education  2018 [employer].   Pap Test Why am I having this test? A pap test is sometimes called a pap smear. It is a screening test that is used to check for signs of cancer of the vagina, cervix, and uterus. The test can also identify the presence of infection or precancerous changes. Your health care provider will likely recommend you have this test done on a regular basis. This test may be done:  Every 3 years, starting at age 77.  Every 5 years, in combination with testing for the presence of  human papillomavirus (HPV).  More or less often depending on other medical conditions.  What kind of sample is taken? Using a small cotton swab, plastic spatula, or brush, your health care provider will collect a sample of cells from the surface of your cervix. Your cervix is the opening to your uterus, also called a womb. Secretions from the cervix and vagina may also be collected. How do I prepare for this test?  Be aware of where you are in your menstrual cycle. You may be asked to reschedule the test if you are menstruating on the day of the test.  You may need to reschedule if you have a known vaginal infection on the day of the test.  You may be asked to avoid douching or taking a bath the day before or the day of the test.  Some medicines can cause abnormal test results, such as digitalis and tetracycline. Talk with your health care provider before your test if you take one of these medicines. What do the results mean? Abnormal test results may indicate a number of health conditions. These may include:  Cancer. Although pap test results cannot be used to diagnose cancer of the cervix, vagina, or uterus, they may suggest the possibility of cancer. Further tests would be required to determine if cancer is present.  Sexually transmitted disease.  Fungal infection.  Parasite infection.  Herpes infection.  A condition causing or contributing to infertility.  It is your responsibility to obtain your test results. Ask the lab or department performing the test when and how you will get your results. Contact your health care provider to discuss any questions you have about your results. Talk with your health care provider to discuss your results, treatment options, and if necessary, the need for more tests. Talk with your health care provider if you have any questions about your results. This information is not intended to replace advice given to you by your health care provider. Make  sure you discuss any questions you have with your health care provider. Document Released: 10/09/2002 Document Revised: 03/24/2016 Document Reviewed: 12/10/2013 Elsevier Interactive Patient Education  [employer].

## 2017-07-21 LAB — HEP, RPR, HIV PANEL
HIV SCREEN 4TH GENERATION: NONREACTIVE
Hepatitis B Surface Ag: NEGATIVE
RPR: NONREACTIVE

## 2017-07-21 LAB — HSV(HERPES SIMPLEX VRS) I + II AB-IGG
HSV 1 GLYCOPROTEIN G AB, IGG: 50.5 {index} — AB (ref 0.00–0.90)
HSV 2 IGG, TYPE SPEC: 7.3 {index} — AB (ref 0.00–0.90)

## 2017-07-21 LAB — CERVICOVAGINAL ANCILLARY ONLY
Bacterial vaginitis: POSITIVE — AB
CHLAMYDIA, DNA PROBE: NEGATIVE
Candida vaginitis: NEGATIVE
NEISSERIA GONORRHEA: NEGATIVE
Trichomonas: NEGATIVE

## 2017-07-21 LAB — CYTOLOGY - PAP
DIAGNOSIS: NEGATIVE
HPV: NOT DETECTED

## 2017-07-26 LAB — CERVICOVAGINAL ANCILLARY ONLY: HERPES (WINDOWPATH): NEGATIVE

## 2017-07-28 NOTE — Telephone Encounter (Signed)
-----   Message from Alfonse Spruce, [place] sent at 07/28/2017 10:11 AM EST ----- Negative cultures for active herpes lesions.

## 2017-07-28 NOTE — Telephone Encounter (Signed)
-----   Message from Alfonse Spruce, [place] sent at 07/28/2017 10:11 AM EST ----- Negative cultures for active herpes lesions.

## 2017-07-28 NOTE — Telephone Encounter (Signed)
Patient informed on lab result.   Patient verified DOB.

## 2018-02-06 ENCOUNTER — Encounter (HOSPITAL_COMMUNITY): Payer: Self-pay

## 2018-02-06 ENCOUNTER — Emergency Department (HOSPITAL_COMMUNITY)
Admission: EM | Admit: 2018-02-06 | Discharge: 2018-02-06 | Disposition: A | Discharge status: Home / Self Care | Payer: Medicaid Other | Attending: Emergency Medicine | Admitting: Emergency Medicine

## 2018-02-06 ENCOUNTER — Other Ambulatory Visit: Payer: Self-pay

## 2018-02-06 DIAGNOSIS — R21 Rash and other nonspecific skin eruption: Secondary | ICD-10-CM | POA: Diagnosis not present

## 2018-02-06 DIAGNOSIS — Z5321 Procedure and treatment not carried out due to patient leaving prior to being seen by health care provider: Secondary | ICD-10-CM | POA: Diagnosis not present

## 2018-02-06 NOTE — ED Notes (Signed)
Called pt for vital recheck three times. No answer.

## 2018-02-06 NOTE — ED Triage Notes (Signed)
Pt endorses rash on her face since yesterday morning. Denies allergies or any new products. Airway intact. No difficulty breathing. VSS

## 2018-02-06 NOTE — ED Notes (Signed)
Pt called again for vitals recheck and room assignment and no answer for 3rd attempt.

## 2018-02-06 NOTE — ED Notes (Signed)
Pt called for room assignment, no answer, will ,make 1 more attempt

## 2018-02-06 NOTE — ED Provider Notes (Cosign Needed Addendum)
Patient placed in Quick Look pathway, seen and evaluated   Chief Complaint: Rash  HPI:   Patient presents today with acute onset of swelling to a focal area to the right side of the face which she noticed developing yesterday morning.  She denies any new soaps, detergents, shampoos, or lotions.  No known insect bites but did [place] the zoo recently.  Denies difficulty breathing or swallowing, shortness of breath, or chest pain.  She states that it is tender and itchy.  She denies dental pain  ROS: Positive for rash/facial swelling Negative for dental pain, fevers, difficulty breathing or swallowing, drooling  Physical Exam:   Gen: No distress  Neuro: Awake and Alert  Skin: Warm    Focused Exam: 1.5 x 1.5 cm firm sem-imobile nodule to the right cheek with no overlying erythema or warmth.  No fluctuance.  No trismus or sublingual abnormalities.  Dentition appears stable.  Tolerating secretions without difficulty.   Initiation of care has begun. The patient has been counseled on the process, plan, and necessity for staying for the completion/evaluation, and the remainder of the medical screening examination    Renita Papa, PA-C 02/06/18 2004

## 2018-02-07 ENCOUNTER — Other Ambulatory Visit: Payer: Self-pay

## 2018-02-07 ENCOUNTER — Emergency Department (HOSPITAL_COMMUNITY)
Admission: EM | Admit: 2018-02-07 | Discharge: 2018-02-07 | Disposition: A | Discharge status: Home / Self Care | Payer: Medicaid Other | Attending: Emergency Medicine | Admitting: Emergency Medicine

## 2018-02-07 ENCOUNTER — Encounter (HOSPITAL_COMMUNITY): Payer: Self-pay | Admitting: Emergency Medicine

## 2018-02-07 DIAGNOSIS — L089 Local infection of the skin and subcutaneous tissue, unspecified: Secondary | ICD-10-CM | POA: Diagnosis not present

## 2018-02-07 DIAGNOSIS — Z79899 Other long term (current) drug therapy: Secondary | ICD-10-CM | POA: Insufficient documentation

## 2018-02-07 DIAGNOSIS — F17210 Nicotine dependence, cigarettes, uncomplicated: Secondary | ICD-10-CM | POA: Diagnosis not present

## 2018-02-07 DIAGNOSIS — R220 Localized swelling, mass and lump, head: Secondary | ICD-10-CM | POA: Diagnosis not present

## 2018-02-07 DIAGNOSIS — R600 Localized edema: Secondary | ICD-10-CM | POA: Diagnosis present

## 2018-02-07 MED ORDER — SULFAMETHOXAZOLE-TRIMETHOPRIM 800-160 MG PO TABS: 1.0000 | ORAL_TABLET | Freq: Two times a day (BID) | ORAL | 0 refills | Status: AC

## 2018-02-07 NOTE — ED Provider Notes (Signed)
[facility] EMERGENCY DEPARTMENT Provider Note   CSN: [id number] Arrival date & time: 02/07/18  2637     History   Chief Complaint Chief Complaint  Patient presents with  . Facial Swelling  . Facial Pain    HPI Denise Moses is a 37 y.o. female.  The history is provided by the patient. No language interpreter was used.  Facial Injury  Mechanism of injury:  Unable to specify Location:  Face Time since incident:  3 days Pain details:    Quality:  Aching   Severity:  Moderate   Timing:  Constant   Progression:  Worsening Foreign body present:  No foreign bodies Relieved by:  Nothing Worsened by:  Nothing Ineffective treatments:  None tried Risk factors: no prior injuries to these areas   Pt complains of swelling to the right side of her face after going to the zoo.  Pt has been using warm compresses   Past Medical History:  Diagnosis Date  . Anemia   . Heart murmur   . Herpes   . Infection    UTI  . Pregnancy induced hypertension    2001    Patient Active Problem List   Diagnosis Date Noted  . Vitamin D deficiency 07/05/2017  . Iron deficiency anemia 07/05/2017  . Ruptured left tubal ectopic pregnancy causing hemoperitoneum 06/09/2016  . 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 Recurrent boils 04/13/2016  . Family history of breast cancer in first degree relative 04/13/2016  . Smoker 07/03/2014    Past Surgical History:  Procedure Laterality Date  . CHOLECYSTECTOMY    . LAPAROSCOPY N/A 06/09/2016   Procedure: LAPAROSCOPY OPERATIVE;  Surgeon: Osborne Oman, MD;  Location: [facility];  Service: Gynecology;  Laterality: N/A;  ectopic   . THERAPEUTIC ABORTION     x3  . UNILATERAL SALPINGECTOMY Left 06/09/2016   Procedure: UNILATERAL SALPINGECTOMY, REMOVAL OF IUD;  Surgeon: Osborne Oman, MD;  Location: [facility];  Service: Gynecology;  Laterality: Left;     OB History    Gravida  11   Para  5   Term  4   Preterm  1   AB  5   Living  5     SAB  2   TAB  3   Ectopic      Multiple      Live Births  5            Home Medications    Prior to Admission medications   Medication Sig Start Date End Date Taking? Authorizing Provider  Acetaminophen (TYLENOL PO) Take 2 tablets by mouth every 6 (six) hours as needed (pain).    [provider]  adapalene (DIFFERIN) 0.1 % gel Apply topically at bedtime. Use as directed. 07/04/17   Alfonse Spruce, FNP  docusate sodium (COLACE) 100 MG capsule Take 1 capsule (100 mg total) by mouth 2 (two) times daily as needed. Patient not taking: Reported on 06/30/2017 06/09/16   Osborne Oman, MD  doxycycline (VIBRA-TABS) 100 MG tablet Take 1 tablet (100 mg total) by mouth 2 (two) times daily. 06/30/17   Alfonse Spruce, FNP  ferrous sulfate 325 (65 FE) MG EC tablet Take 1 tablet (325 mg total) by mouth 3 (three) times daily with meals. 07/05/17   Alfonse Spruce, FNP  ibuprofen (ADVIL,MOTRIN) 600 MG tablet Take 1 tablet (600 mg total) by mouth every 6 (six) hours as needed. Patient not taking: Reported on 06/30/2017  06/09/16   Anyanwu, Sallyanne Havers, MD  metroNIDAZOLE (FLAGYL) 500 MG tablet Take 1 tablet (500 mg total) by mouth 2 (two) times daily. 07/20/17   Alfonse Spruce, FNP  oxyCODONE-acetaminophen (PERCOCET/ROXICET) 5-325 MG tablet Take 1-2 tablets by mouth every 6 (six) hours as needed. Patient not taking: Reported on 06/30/2017 06/09/16   Anyanwu, Sallyanne Havers, MD  Soap & Cleansers (CETAPHIL GENTLE CLEANSER) LIQD Apply 1 application topically 2 (two) times daily. Use as directed. 06/30/17   Alfonse Spruce, FNP  Vitamin D, Ergocalciferol, (DRISDOL) 50000 units CAPS capsule TAKE ONE CAPSULE BY MOUTH ONCE A WEEK. FOR 12 WEEKS. 07/05/17   Alfonse Spruce, FNP    Family History Family History  Problem Relation Age of Onset  . Breast cancer Mother 82       was a smoker, alcohol drinker in early life   . Breast cancer Maternal Aunt     . Cancer Maternal Grandfather   . Breast cancer Maternal Aunt     Social History Social History   Tobacco Use  . Smoking status: Current Every Day Smoker    Packs/day: 0.25    Years: 10.00    Pack years: 2.50    Types: Cigarettes  . Smokeless tobacco: Never Used  . Tobacco comment: 2-3 cigs/day  Substance Use Topics  . Alcohol use: No    Comment: social drinker  . Drug use: Yes    Types: Marijuana    Comment: occ     Allergies   Patient has no known allergies.   Review of Systems Review of Systems  All other systems reviewed and are negative.    Physical Exam Updated Vital Signs BP 128/70 (BP Location: Right Arm)   Pulse 76   Temp 97.9 F (36.6 C) (Oral)   Resp 16   Ht 5\' 6"  (1.676 m)   Wt 81.6 kg (180 lb)   LMP 01/31/2018   SpO2 100%   BMI 29.05 kg/m   Physical Exam  Constitutional: She appears well-developed and well-nourished.  HENT:  Head: Normocephalic.  Swelling right side of face 2x2 cm. Mouth no sign of abscess.   Eyes: Pupils are equal, round, and reactive to light.  Neck: Normal range of motion.  Cardiovascular: Normal rate.  Pulmonary/Chest: Effort normal.  Musculoskeletal: Normal range of motion.  Neurological: She is alert.  Skin: Skin is warm.  Psychiatric: She has a normal mood and affect.  Nursing note and vitals reviewed.    ED Treatments / Results  Labs (all labs ordered are listed, but only abnormal results are displayed) Labs Reviewed - No data to display  EKG None  Radiology No results found.  Procedures Procedures (including critical care time)  Medications Ordered in ED Medications - No data to display   Initial Impression / Assessment and Plan / ED Course  I have reviewed the triage vital signs and the nursing notes.  Pertinent labs & imaging results that were available during my care of the patient were reviewed by me and considered in my medical decision making (see chart for details).     MDM  Area  appears to be superficial, skin.  Possible bite vs pimple/infection.   Pt advised to continue warm compresses. Rx for bactrim  Final Clinical Impressions(s) / ED Diagnoses   Final diagnoses:  Skin infection    ED Discharge Orders        Ordered    sulfamethoxazole-trimethoprim (BACTRIM DS,SEPTRA DS) 800-160 MG tablet  2 times daily  02/07/18 9562    An After Visit Summary was printed and given to the patient.    Fransico Meadow, PA-C 02/07/18 1308    Davonna Belling, MD 02/07/18 [phone]

## 2018-02-07 NOTE — Discharge Instructions (Signed)
Return if any problems.  See your Physician for recheck in 2-3 days °

## 2018-02-07 NOTE — ED Triage Notes (Signed)
Pt. Stated, [place] my face started swelling and in pain. Its still there , Ive taken tylenol .  Pt eating in triage room.

## 2018-02-07 NOTE — ED Triage Notes (Signed)
Pt. Left yesterday AMA

## 2018-07-11 ENCOUNTER — Ambulatory Visit: Payer: Medicaid Other | Admitting: Family Medicine

## 2018-07-28 ENCOUNTER — Ambulatory Visit: Payer: Medicaid Other | Admitting: Family Medicine

## 2018-08-25 ENCOUNTER — Ambulatory Visit: Payer: Medicaid Other | Admitting: Family Medicine

## 2018-10-13 DIAGNOSIS — Z3009 Encounter for other general counseling and advice on contraception: Secondary | ICD-10-CM | POA: Diagnosis not present

## 2018-11-01 ENCOUNTER — Emergency Department (HOSPITAL_COMMUNITY): Payer: Medicaid Other

## 2018-11-01 ENCOUNTER — Encounter (HOSPITAL_COMMUNITY): Payer: Self-pay | Admitting: *Deleted

## 2018-11-01 ENCOUNTER — Other Ambulatory Visit: Payer: Self-pay

## 2018-11-01 ENCOUNTER — Emergency Department (HOSPITAL_COMMUNITY)
Admission: EM | Admit: 2018-11-01 | Discharge: 2018-11-01 | Disposition: A | Discharge status: Home / Self Care | Payer: Medicaid Other | Attending: Emergency Medicine | Admitting: Emergency Medicine

## 2018-11-01 DIAGNOSIS — R42 Dizziness and giddiness: Secondary | ICD-10-CM | POA: Insufficient documentation

## 2018-11-01 DIAGNOSIS — D649 Anemia, unspecified: Secondary | ICD-10-CM

## 2018-11-01 DIAGNOSIS — R079 Chest pain, unspecified: Secondary | ICD-10-CM | POA: Diagnosis not present

## 2018-11-01 DIAGNOSIS — F17210 Nicotine dependence, cigarettes, uncomplicated: Secondary | ICD-10-CM | POA: Insufficient documentation

## 2018-11-01 HISTORY — DX: Encounter for elective termination of pregnancy: Z33.2

## 2018-11-01 LAB — BASIC METABOLIC PANEL
Anion gap: 7 (ref 5–15)
BUN: 7 mg/dL (ref 6–20)
CO2: 19 mmol/L — ABNORMAL LOW (ref 22–32)
Calcium: 8.8 mg/dL — ABNORMAL LOW (ref 8.9–10.3)
Chloride: 110 mmol/L (ref 98–111)
Creatinine, Ser: 0.67 mg/dL (ref 0.44–1.00)
GFR calc Af Amer: >60 mL/min (ref >60)
GFR calc non Af Amer: >60 mL/min (ref >60)
Glucose, Bld: 60 mg/dL — ABNORMAL LOW (ref 70–99)
Potassium: 3.6 mmol/L (ref 3.5–5.1)
Sodium: 136 mmol/L (ref 135–145)

## 2018-11-01 LAB — TROPONIN I: Troponin I: <0.03 ng/mL (ref <0.03)

## 2018-11-01 LAB — CBC
HCT: 32.4 % — ABNORMAL LOW (ref 36.0–46.0)
Hemoglobin: 9.6 g/dL — ABNORMAL LOW (ref 12.0–15.0)
MCH: 24.3 pg — ABNORMAL LOW (ref 26.0–34.0)
MCHC: 29.6 g/dL — ABNORMAL LOW (ref 30.0–36.0)
MCV: 82 fL (ref 80.0–100.0)
Platelets: 261 10*3/uL (ref 150–400)
RBC: 3.95 MIL/uL (ref 3.87–5.11)
RDW: 20.1 % — ABNORMAL HIGH (ref 11.5–15.5)
WBC: 6.9 10*3/uL (ref 4.0–10.5)
nRBC: 0 % (ref 0.0–0.2)

## 2018-11-01 LAB — SAMPLE TO BLOOD BANK

## 2018-11-01 MED ORDER — ACETAMINOPHEN 325 MG PO TABS
650.0000 mg | ORAL_TABLET | Freq: Once | ORAL | Status: AC
  Administered 2018-11-01: 12:00:00 650 mg via ORAL
  Filled 2018-11-01: qty 2

## 2018-11-01 MED ORDER — SODIUM CHLORIDE 0.9 % IV BOLUS
1000.0000 mL | Freq: Once | INTRAVENOUS | Status: AC
  Administered 2018-11-01: 1000 mL via INTRAVENOUS

## 2018-11-01 NOTE — Discharge Instructions (Addendum)
Consider taking an over-the-counter iron supplement, follow-up with your OB doctor next week to be rechecked, return as needed for worsening symptoms

## 2018-11-01 NOTE — ED Provider Notes (Signed)
[facility] EMERGENCY DEPARTMENT Provider Note   CSN: [id number] Arrival date & time: 11/01/18  0932    History   Chief Complaint Chief Complaint  Patient presents with   Dizziness    HPI Denise Moses is a 38 y.o. female.     HPI Patient presents to the emergency room for evaluation of dizziness.  Patient states she had abortion on Saturday.  Patient states she has not been feeling well  Over the weekend.  States last evening her legs were swollen.  She has some discomfort in her chest.  She has also felt lightheaded and dizzy when she stands.  Today she is not having any issues with any chest pain and her leg swelling has decreased.  However, she continues to feel lightheaded and dizzy when she stands.  She has had increased vaginal bleeding.  She is using 3 pads per day.  She denies any lower abdominal pain.  Patient called the doctor who performed her an elective abortion and was instructed to come to the ED for evaluation. 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;  Service: Gynecology;  Laterality: N/A;  ectopic    THERAPEUTIC ABORTION     x3   UNILATERAL SALPINGECTOMY Left 06/09/2016   Procedure: UNILATERAL SALPINGECTOMY, REMOVAL OF IUD;  Surgeon: Osborne Oman,  MD;  Location: [facility];  Service: Gynecology;  Laterality: Left;     OB History    Gravida  11   Para  5   Term  4   Preterm  1   AB  5   Living  5     SAB  2   TAB  3   Ectopic      Multiple      Live Births  5            Home Medications    Prior to Admission medications   Medication Sig Start Date End Date Taking? Authorizing Provider  Acetaminophen (TYLENOL PO) Take 2 tablets by mouth every 6 (six) hours as needed (pain).    [provider]  adapalene (DIFFERIN) 0.1 % gel Apply topically at bedtime. Use as directed. 07/04/17   Alfonse Spruce, FNP  docusate sodium (COLACE) 100 MG capsule Take 1 capsule (100 mg total) by mouth 2 (two) times daily as needed. Patient not taking: Reported on 06/30/2017 06/09/16   Osborne Oman, MD  doxycycline (VIBRA-TABS) 100 MG tablet Take 1 tablet (100 mg total) by mouth 2 (two) times daily. 06/30/17   Alfonse Spruce, FNP  ferrous sulfate 325 (65 FE) MG EC tablet Take 1 tablet (325 mg total) by mouth 3 (three) times daily with meals. 07/05/17   Alfonse Spruce, FNP  ibuprofen (ADVIL,MOTRIN) 600 MG tablet Take 1 tablet (600 mg total) by mouth every 6 (six) hours as needed. Patient not taking: Reported on 06/30/2017 06/09/16   Anyanwu, Sallyanne Havers, MD  metroNIDAZOLE (FLAGYL) 500 MG tablet Take 1 tablet (500 mg total) by mouth 2 (two) times daily. 07/20/17   Alfonse Spruce, FNP  oxyCODONE-acetaminophen (PERCOCET/ROXICET) 5-325 MG tablet Take 1-2 tablets by mouth every 6 (six) hours as needed. Patient not taking: Reported on 06/30/2017 06/09/16   Anyanwu, Sallyanne Havers, MD  Soap & Cleansers (CETAPHIL GENTLE CLEANSER) LIQD Apply 1 application topically 2 (two) times daily. Use as directed. 06/30/17   Alfonse Spruce, FNP  Vitamin D, Ergocalciferol, (DRISDOL) 50000 units CAPS capsule TAKE ONE CAPSULE BY MOUTH ONCE A WEEK. FOR 12 WEEKS. 07/05/17   Alfonse Spruce, FNP    Family History Family History    Problem Relation Age of Onset   Breast cancer Mother 66       was a smoker, alcohol drinker in early life    Breast cancer Maternal Aunt    Cancer Maternal Grandfather    Breast cancer Maternal Aunt     Social History Social History   Tobacco Use   Smoking status: Current Every Day Smoker    Packs/day: 0.25    Years: 10.00    Pack years: 2.50    Types: Cigarettes   Smokeless tobacco: Never Used   Tobacco comment: 2-3 cigs/day  Substance Use Topics   Alcohol use: No    Comment: social drinker   Drug use: Yes    Types: Marijuana    Comment: occ     Allergies   Patient has no known allergies.   Review of Systems Review of Systems  All other systems reviewed and are negative.    Physical Exam Updated Vital Signs BP 128/83 (BP Location: Right Arm)    Pulse 73    Temp 97.9 F (36.6 C) (Oral)    Resp 14    Ht 1.676 m (5\' 6" )    Wt 82.1 kg    LMP 09/15/2018    SpO2 100%    BMI 29.21 kg/m   Physical Exam Vitals signs and nursing note reviewed.  Constitutional:      General: She is not in acute distress.    Appearance: She is well-developed.  HENT:     Head: Normocephalic and atraumatic.     Right Ear: External ear normal.     Left Ear: External ear normal.  Eyes:     General: No scleral icterus.       Right eye: No discharge.        Left eye: No discharge.     Conjunctiva/sclera: Conjunctivae normal.  Neck:     Musculoskeletal: Neck supple.     Trachea: No tracheal deviation.  Cardiovascular:     Rate and Rhythm: Normal rate and regular rhythm.  Pulmonary:     Effort: Pulmonary effort is normal. No respiratory distress.     Breath sounds: Normal breath sounds. No stridor. No wheezing or rales.  Abdominal:     General: Bowel sounds are normal. There is no distension.     Palpations: Abdomen is soft.     Tenderness: There is no abdominal tenderness. There is no guarding or rebound.     Comments: No abdominal tenderness  Musculoskeletal:         General: No tenderness.     Right lower leg: No edema.     Left  lower leg: No edema.  Skin:    General: Skin is warm and dry.     Findings: No rash.  Neurological:     Mental Status: She is alert.     Cranial Nerves: No cranial nerve deficit (no facial droop, extraocular movements intact, no slurred speech).     Sensory: No sensory deficit.     Motor: No abnormal muscle tone or seizure activity.     Coordination: Coordination normal.      ED Treatments / Results  Labs (all labs ordered are listed, but only abnormal results are displayed) Labs Reviewed  CBC - Abnormal; Notable for the following components:      Result Value   Hemoglobin 9.6 (*)    HCT 32.4 (*)    MCH 24.3 (*)    MCHC 29.6 (*)    RDW 20.1 (*)    All other components within normal limits  BASIC METABOLIC PANEL - Abnormal; Notable for the following components:   CO2 19 (*)    Glucose, Bld 60 (*)    Calcium 8.8 (*)    All other components within normal limits  TROPONIN I  SAMPLE TO BLOOD BANK    EKG EKG Interpretation  Date/Time:  Wednesday November 01 2018 09:49:35 EDT Ventricular Rate:  70 PR Interval:    QRS Duration: 108 QT Interval:  385 QTC Calculation: 416 R Axis:   -17 Text Interpretation:  Sinus rhythm Borderline left axis deviation Low voltage, precordial leads RSR' in V1 or V2, probably normal variant Borderline T abnormalities, anterior leads No significant change since last tracing Confirmed by Dorie Rank [phone]) on 11/01/2018 10:05:55 AM   Radiology Dg Chest 2 View  Result Date: 11/01/2018 CLINICAL DATA:  Chest pain, dizziness, weakness, and lower extremity swelling. Recent abortion. EXAM: CHEST - 2 VIEW COMPARISON:  None. FINDINGS: The cardiomediastinal silhouette is within normal limits. The lungs are well inflated and clear. There is no evidence of pleural effusion or pneumothorax. No acute osseous abnormality is identified. IMPRESSION: No active cardiopulmonary disease. Electronically  Signed   By: Logan Bores M.D.   On: 11/01/2018 10:36    Procedures Procedures (including critical care time)  Medications Ordered in ED Medications  acetaminophen (TYLENOL) tablet 650 mg (has no administration in time range)  sodium chloride 0.9 % bolus 1,000 mL (0 mLs Intravenous Stopped 11/01/18 1155)     Initial Impression / Assessment and Plan / ED Course  I have reviewed the triage vital signs and the nursing notes.  Pertinent labs & imaging results that were available during my care of the patient were reviewed by me and considered in my medical decision making (see chart for details).  Clinical Course as of Oct 31 1209  Wed Nov 01, 2018  1114 Hemoglobin decreased from previously   [JK]    Clinical Course User Index [JK] Dorie Rank, MD     ED work-up reassuring.  Mild anemia slightly increased from previous but no significant bleeding here in the emergency room and no indication for transfusion.  Patient's vitals are stable.  She is not having abdominal pain I doubt acute infection associated with her abortion.  Patient complained of bilateral leg swelling the other night but this has all resolved at this point.  She does not have any swelling now.  This was likely dependent edema.  Patient may be feeling lightheaded from her mild anemia.  Reassured patient.  She is feeling better and is ready for discharge.  Final Clinical  Impressions(s) / ED Diagnoses   Final diagnoses:  Dizziness  Anemia, unspecified type    ED Discharge Orders    None       Dorie Rank, MD 11/01/18 1212

## 2018-11-01 NOTE — ED Triage Notes (Signed)
Pt from home c/o bil LE swelling (none noted), chest pain, dizziness when standing after having an elective abortion on Sat.  States bleeding has increased to 3 pads per day.  Denies lower abdominal pain.

## 2018-11-13 ENCOUNTER — Ambulatory Visit: Payer: Medicaid Other | Admitting: Family Medicine

## 2018-11-27 ENCOUNTER — Encounter: Payer: Medicaid Other | Admitting: Obstetrics & Gynecology

## 2018-11-27 DIAGNOSIS — Z3490 Encounter for supervision of normal pregnancy, unspecified, unspecified trimester: Secondary | ICD-10-CM | POA: Insufficient documentation

## 2018-12-08 ENCOUNTER — Other Ambulatory Visit: Payer: Self-pay

## 2018-12-08 ENCOUNTER — Emergency Department (HOSPITAL_COMMUNITY)
Admission: EM | Admit: 2018-12-08 | Discharge: 2018-12-08 | Disposition: A | Discharge status: Home / Self Care | Payer: Medicaid Other | Attending: Emergency Medicine | Admitting: Emergency Medicine

## 2018-12-08 DIAGNOSIS — Z79899 Other long term (current) drug therapy: Secondary | ICD-10-CM | POA: Insufficient documentation

## 2018-12-08 DIAGNOSIS — L02214 Cutaneous abscess of groin: Secondary | ICD-10-CM | POA: Diagnosis not present

## 2018-12-08 DIAGNOSIS — F17210 Nicotine dependence, cigarettes, uncomplicated: Secondary | ICD-10-CM | POA: Insufficient documentation

## 2018-12-08 DIAGNOSIS — L0291 Cutaneous abscess, unspecified: Secondary | ICD-10-CM

## 2018-12-08 MED ORDER — LIDOCAINE-EPINEPHRINE (PF) 1 %-1:200000 IJ SOLN
10.0000 mL | Freq: Once | INTRAMUSCULAR | Status: DC
  Filled 2018-12-08: qty 10

## 2018-12-08 MED ORDER — LIDOCAINE-EPINEPHRINE (PF) 2 %-1:200000 IJ SOLN
10.0000 mL | Freq: Once | INTRAMUSCULAR | Status: AC
  Administered 2018-12-08: 10 mL
  Filled 2018-12-08: qty 20

## 2018-12-08 MED ORDER — SULFAMETHOXAZOLE-TRIMETHOPRIM 800-160 MG PO TABS: 1.0000 | ORAL_TABLET | Freq: Two times a day (BID) | ORAL | 0 refills | Status: AC

## 2018-12-08 NOTE — ED Triage Notes (Signed)
Patient reports abscess to R groin since Monday. States she's had I&Ds to axillae abscesses in the past. Denies fevers.

## 2018-12-08 NOTE — ED Provider Notes (Signed)
[facility] EMERGENCY DEPARTMENT Provider Note   CSN: [id number] Arrival date & time: 12/08/18  0944    History   Chief Complaint Chief Complaint  Patient presents with  . Abscess    HPI Denise Moses is a 38 y.o. female.     38 year old female presents with complaint of right groin abscess x4 days.  Patient reports area is progressively worsening, nondraining.  Patient reports prior abscess in her axillary area that was treated with I&D, no known history of MRSA.  No other complaints or concerns.  Patient is nondiabetic, otherwise healthy.     Past Medical History:  Diagnosis Date  . Anemia   . Elective abortion   . Heart murmur   . Herpes   . Infection    UTI  . Pregnancy induced hypertension    2001    Patient Active Problem List   Diagnosis Date Noted  . Supervision of normal pregnancy, antepartum 11/27/2018  . Vitamin D deficiency 07/05/2017  . Iron deficiency anemia 07/05/2017  . Ruptured left tubal ectopic pregnancy causing hemoperitoneum 06/09/2016  . 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 Recurrent boils 04/13/2016  . Family history of breast cancer in first degree relative 04/13/2016  . Smoker 07/03/2014    Past Surgical History:  Procedure Laterality Date  . CHOLECYSTECTOMY    . LAPAROSCOPY N/A 06/09/2016   Procedure: LAPAROSCOPY OPERATIVE;  Surgeon: Osborne Oman, MD;  Location: [facility];  Service: Gynecology;  Laterality: N/A;  ectopic   . THERAPEUTIC ABORTION     x3  . UNILATERAL SALPINGECTOMY Left 06/09/2016   Procedure: UNILATERAL SALPINGECTOMY, REMOVAL OF IUD;  Surgeon: Osborne Oman, MD;  Location: [facility];  Service: Gynecology;  Laterality: Left;     OB History    Gravida  11   Para  5   Term  4   Preterm  1   AB  5   Living  5     SAB  2   TAB  3   Ectopic      Multiple      Live Births  5            Home Medications    Prior to Admission medications   Medication Sig Start  Date End Date Taking? Authorizing Provider  Acetaminophen (TYLENOL PO) Take 2 tablets by mouth every 6 (six) hours as needed (pain).    [provider]  adapalene (DIFFERIN) 0.1 % gel Apply topically at bedtime. Use as directed. 07/04/17   Alfonse Spruce, FNP  ferrous sulfate 325 (65 FE) MG EC tablet Take 1 tablet (325 mg total) by mouth 3 (three) times daily with meals. 07/05/17   Alfonse Spruce, FNP  Soap & Cleansers (CETAPHIL GENTLE CLEANSER) LIQD Apply 1 application topically 2 (two) times daily. Use as directed. 06/30/17   Alfonse Spruce, FNP  sulfamethoxazole-trimethoprim (BACTRIM DS) 800-160 MG tablet Take 1 tablet by mouth 2 (two) times daily for 7 days. 12/08/18 12/15/18  Tacy Learn, PA-C  Vitamin D, Ergocalciferol, (DRISDOL) 50000 units CAPS capsule TAKE ONE CAPSULE BY MOUTH ONCE A WEEK. FOR 12 WEEKS. 07/05/17   Alfonse Spruce, FNP    Family History Family History  Problem Relation Age of Onset  . Breast cancer Mother 55       was a smoker, alcohol drinker in early life   . Breast cancer Maternal Aunt   . Cancer Maternal Grandfather   . Breast cancer  Maternal Aunt     Social History Social History   Tobacco Use  . Smoking status: Current Every Day Smoker    Packs/day: 0.25    Years: 10.00    Pack years: 2.50    Types: Cigarettes  . Smokeless tobacco: Never Used  . Tobacco comment: 2-3 cigs/day  Substance Use Topics  . Alcohol use: No    Comment: social drinker  . Drug use: Yes    Types: Marijuana    Comment: occ     Allergies   Patient has no known allergies.   Review of Systems Review of Systems   Physical Exam Updated Vital Signs BP (!) 155/80 (BP Location: Right Arm)   Pulse 71   Temp 98.2 F (36.8 C) (Oral)   Resp 15   LMP 12/01/2018 (Approximate)   SpO2 99%   Physical Exam Vitals signs and nursing note reviewed.  Constitutional:      General: She is not in acute distress.    Appearance: She is well-developed.  She is not diaphoretic.  HENT:     Head: Normocephalic and atraumatic.  Cardiovascular:     Pulses: Normal pulses.  Pulmonary:     Effort: Pulmonary effort is normal.  Abdominal:    Skin:    General: Skin is warm and dry.     Findings: Erythema present. No rash.  Neurological:     Mental Status: She is alert and oriented to person, place, and time.  Psychiatric:        Behavior: Behavior normal.      ED Treatments / Results  Labs (all labs ordered are listed, but only abnormal results are displayed) Labs Reviewed - No data to display  EKG None  Radiology No results found.  Procedures .Marland KitchenIncision and Drainage Date/Time: 12/08/2018 12:42 PM Performed by: Tacy Learn, PA-C Authorized by: Tacy Learn, PA-C   Consent:    Consent obtained:  Verbal   Consent given by:  Patient   Risks discussed:  Bleeding, incomplete drainage, pain and damage to other organs   Alternatives discussed:  No treatment Universal protocol:    Procedure explained and questions answered to patient or proxy's satisfaction: yes     Relevant documents present and verified: yes     Test results available and properly labeled: yes     Imaging studies available: yes     Required blood products, implants, devices, and special equipment available: yes     Site/side marked: yes     Immediately prior to procedure a time out was called: yes     Patient identity confirmed:  Verbally with patient Location:    Type:  Abscess   Size:  3cm x 2cm   Location:  Trunk   Trunk location: right groin. Pre-procedure details:    Skin preparation:  Betadine Anesthesia (see MAR for exact dosages):    Anesthesia method:  Local infiltration   Local anesthetic:  Lidocaine 2% WITH epi Procedure type:    Complexity:  Complex Procedure details:    Incision types:  Single straight   Incision depth:  Subcutaneous   Scalpel blade:  11   Wound management:  Probed and deloculated, irrigated with saline and  extensive cleaning   Drainage:  Purulent   Drainage amount:  Moderate   Wound treatment:  Wound left open   Packing materials:  1/4 in gauze Post-procedure details:    Patient tolerance of procedure:  Tolerated well, no immediate complications   (including critical care  time)  Medications Ordered in ED Medications  lidocaine-EPINEPHrine (XYLOCAINE-EPINEPHrine) 1 %-1:200000 (PF) injection 10 mL (10 mLs Infiltration Not Given 12/08/18 1027)  lidocaine-EPINEPHrine (XYLOCAINE W/EPI) 2 %-1:200000 (PF) injection 10 mL (10 mLs Infiltration Given by Other 12/08/18 1027)     Initial Impression / Assessment and Plan / ED Course  I have reviewed the triage vital signs and the nursing notes.  Pertinent labs & imaging results that were available during my care of the patient were reviewed by me and considered in my medical decision making (see chart for details).  Clinical Course as of Dec 08 1242  Fri Dec 08, 2018  1240 37yo females with right groin abscess x 4 days. I&D with packing placed, given rx for bactrim with plsm to recj in 2 days for packing removal.    [LM]    Clinical Course User Index [LM] Tacy Learn, PA-C      Final Clinical Impressions(s) / ED Diagnoses   Final diagnoses:  Abscess    ED Discharge Orders         Ordered    sulfamethoxazole-trimethoprim (BACTRIM DS) 800-160 MG tablet  2 times daily     12/08/18 1038           Roque Lias 12/08/18 1244    Valarie Merino, MD 12/08/18 1308

## 2018-12-08 NOTE — ED Notes (Signed)
Patient verbalized understanding of discharge instructions and prescription medication and denies any further needs or questions at this time. VS stable. Patient ambulatory with steady gait.  

## 2018-12-08 NOTE — Discharge Instructions (Addendum)
Apply warm compresses to area for 20 minutes at a time. Take Septra as prescribed and complete the full course. Wound check in 2 days for packing removal. Return to ER for fever, worsening pain or other concerning symptoms.

## 2019-01-03 ENCOUNTER — Ambulatory Visit: Payer: Medicaid Other | Attending: Nurse Practitioner | Admitting: Nurse Practitioner

## 2019-01-03 ENCOUNTER — Other Ambulatory Visit: Payer: Self-pay

## 2019-01-03 NOTE — Progress Notes (Signed)
This encounter was created in error - please disregard.

## 2019-01-03 NOTE — Progress Notes (Deleted)
Virtual Visit via Telephone Note Due to national recommendations of social distancing due to [place] 19, telehealth visit is felt to be most appropriate for this patient at this time.  I discussed the limitations, risks, security and privacy concerns of performing an evaluation and management service by telephone and the availability of in person appointments. I also discussed with the patient that there may be a patient responsible charge related to this service. The patient expressed understanding and agreed to proceed.    I connected with [name] on 01/03/19  at   1:50 PM EDT  EDT by telephone and verified that I am speaking with the correct person using two identifiers.   Consent I discussed the limitations, risks, security and privacy concerns of performing an evaluation and management service by telephone and the availability of in person appointments. I also discussed with the patient that there may be a patient responsible charge related to this service. The patient expressed understanding and agreed to proceed.   Location of Patient: Private Residence    Location of Provider: [place] and [place] participating in Telemedicine visit: Geryl Rankins FNP-BC [place]    History of Present Illness: Telemedicine visit for: ***    Past Medical History:  Diagnosis Date  . Anemia   . Elective abortion   . Heart murmur   . Herpes   . Infection    UTI  . Pregnancy induced hypertension    2001    Past Surgical History:  Procedure Laterality Date  . CHOLECYSTECTOMY    . LAPAROSCOPY N/A 06/09/2016   Procedure: LAPAROSCOPY OPERATIVE;  Surgeon: Osborne Oman, MD;  Location: [facility];  Service: Gynecology;  Laterality: N/A;  ectopic   . THERAPEUTIC ABORTION     x3  . UNILATERAL SALPINGECTOMY Left 06/09/2016   Procedure: UNILATERAL SALPINGECTOMY, REMOVAL OF IUD;  Surgeon: Osborne Oman, MD;  Location: [facility];   Service: Gynecology;  Laterality: Left;    Family History  Problem Relation Age of Onset  . Breast cancer Mother 27       was a smoker, alcohol drinker in early life   . Breast cancer Maternal Aunt   . Cancer Maternal Grandfather   . Breast cancer Maternal Aunt     Social History   Socioeconomic History  . Marital status: Single    Spouse name: Not on file  . Number of children: Not on file  . Years of education: Not on file  . Highest education level: Not on file  Occupational History  . Not on file  Social Needs  . Financial resource strain: Not on file  . Food insecurity:    Worry: Not on file    Inability: Not on file  . Transportation needs:    Medical: Not on file    Non-medical: Not on file  Tobacco Use  . Smoking status: Current Every Day Smoker    Packs/day: 0.25    Years: 10.00    Pack years: 2.50    Types: Cigarettes  . Smokeless tobacco: Never Used  . Tobacco comment: 2-3 cigs/day  Substance and Sexual Activity  . Alcohol use: No    Comment: social drinker  . Drug use: Yes    Types: Marijuana    Comment: occ  . Sexual activity: Not Currently  Lifestyle  . Physical activity:    Days per week: Not on file    Minutes per session: Not  on file  . Stress: Not on file  Relationships  . Social connections:    Talks on phone: Not on file    Gets together: Not on file    Attends religious service: Not on file    Active member of club or organization: Not on file    Attends meetings of clubs or organizations: Not on file    Relationship status: Not on file  Other Topics Concern  . Not on file  Social History Narrative  . Not on file     Observations/Objective: Awake, alert and oriented x 3   ROS  Assessment and Plan: There are no diagnoses linked to this encounter.   Follow Up Instructions No follow-ups on file.     I discussed the assessment and treatment plan with the patient. The patient was provided an opportunity to ask questions and  all were answered. The patient agreed with the plan and demonstrated an understanding of the instructions.   The patient was advised to call back or seek an in-person evaluation if the symptoms worsen or if the condition fails to improve as anticipated.  I provided *** minutes of non-face-to-face time during this encounter including median intraservice time, reviewing previous notes, labs, imaging, medications and explaining diagnosis and management.  Gildardo Pounds, FNP-BC

## 2019-02-18 ENCOUNTER — Emergency Department (HOSPITAL_COMMUNITY)
Admission: EM | Admit: 2019-02-18 | Discharge: 2019-02-18 | Disposition: A | Discharge status: Home / Self Care | Payer: Medicaid Other | Attending: Emergency Medicine | Admitting: Emergency Medicine

## 2019-02-18 ENCOUNTER — Emergency Department (HOSPITAL_COMMUNITY): Payer: Medicaid Other

## 2019-02-18 ENCOUNTER — Encounter (HOSPITAL_COMMUNITY): Payer: Self-pay | Admitting: Emergency Medicine

## 2019-02-18 ENCOUNTER — Other Ambulatory Visit: Payer: Self-pay

## 2019-02-18 DIAGNOSIS — M62838 Other muscle spasm: Secondary | ICD-10-CM | POA: Diagnosis not present

## 2019-02-18 DIAGNOSIS — M25511 Pain in right shoulder: Secondary | ICD-10-CM | POA: Diagnosis not present

## 2019-02-18 DIAGNOSIS — M542 Cervicalgia: Secondary | ICD-10-CM | POA: Diagnosis present

## 2019-02-18 DIAGNOSIS — F17210 Nicotine dependence, cigarettes, uncomplicated: Secondary | ICD-10-CM | POA: Diagnosis not present

## 2019-02-18 MED ORDER — METHOCARBAMOL 500 MG PO TABS: 500.0000 mg | ORAL_TABLET | Freq: Two times a day (BID) | ORAL | 0 refills | Status: DC

## 2019-02-18 NOTE — ED Triage Notes (Signed)
Rt neck and shoulderpain   Since yesterday . No injury she  states

## 2019-02-18 NOTE — Discharge Instructions (Signed)
You were seen in the ED today for pain to your neck and right shoulder Your x ray was negative Please take muscle relaxer as prescribed. I recommend taking at nighttime as this can make [place] drowsy.  Take Ibuprofen and Tylenol as needed for pain Please follow up with orthopedics

## 2019-02-18 NOTE — ED Notes (Signed)
Neck pain since yesterday  No pain med today as she looks and play on her cell phone

## 2019-02-18 NOTE — ED Notes (Signed)
Patient transported to X-ray 

## 2019-02-18 NOTE — ED Notes (Signed)
To x-ray

## 2019-02-18 NOTE — ED Provider Notes (Signed)
[facility] EMERGENCY DEPARTMENT Provider Note   CSN: [id number] Arrival date & time: 02/18/19  1427    History   Chief Complaint Chief Complaint  Patient presents with  . Neck Pain    HPI Denise Moses is a 38 y.o. female who presents to the ED today complaining of gradual onset, constant, achy, right lateral neck pain and right shoulder pain x 1 day. Pt reports she woke up with the pain and associated it as a "crick" in her neck but states it has not improved with Tylenol. 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 No known injury to the shoulder. Denies fever, chills, weakness, numbness, tingling.        Past Medical History:  Diagnosis Date  . Anemia   . Elective abortion   . Heart murmur   . Herpes   . Infection    UTI  . Pregnancy induced hypertension    2001    Patient Active Problem List   Diagnosis Date Noted  . Supervision of normal pregnancy, antepartum 11/27/2018  . Vitamin D deficiency 07/05/2017  . Iron deficiency anemia 07/05/2017  . Ruptured left tubal ectopic pregnancy causing hemoperitoneum 06/09/2016  . 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 Recurrent boils 04/13/2016  . Family history of breast cancer in first degree relative 04/13/2016  . Smoker 07/03/2014    Past Surgical History:  Procedure Laterality Date  . CHOLECYSTECTOMY    . LAPAROSCOPY N/A 06/09/2016   Procedure: LAPAROSCOPY OPERATIVE;  Surgeon: Osborne Oman, MD;  Location: [facility];  Service: Gynecology;  Laterality: N/A;  ectopic   . THERAPEUTIC ABORTION     x3  . UNILATERAL SALPINGECTOMY Left 06/09/2016   Procedure: UNILATERAL SALPINGECTOMY, REMOVAL OF IUD;  Surgeon: Osborne Oman, MD;  Location: [facility];  Service: Gynecology;  Laterality: Left;     OB History    Gravida  11   Para  5   Term  4   Preterm  1   AB  5   Living  5     SAB  2   TAB  3   Ectopic      Multiple      Live Births  5            Home  Medications    Prior to Admission medications   Medication Sig Start Date End Date Taking? Authorizing Provider  Acetaminophen (TYLENOL PO) Take 2 tablets by mouth every 6 (six) hours as needed (pain).    [provider]  adapalene (DIFFERIN) 0.1 % gel Apply topically at bedtime. Use as directed. 07/04/17   Alfonse Spruce, FNP  ferrous sulfate 325 (65 FE) MG EC tablet Take 1 tablet (325 mg total) by mouth 3 (three) times daily with meals. 07/05/17   Alfonse Spruce, FNP  methocarbamol (ROBAXIN) 500 MG tablet Take 1 tablet (500 mg total) by mouth 2 (two) times daily. 02/18/19   Alroy Bailiff, Anaiza Behrens, PA-C  Soap & Cleansers (CETAPHIL GENTLE CLEANSER) LIQD Apply 1 application topically 2 (two) times daily. Use as directed. 06/30/17   Alfonse Spruce, FNP  Vitamin D, Ergocalciferol, (DRISDOL) 50000 units CAPS capsule TAKE ONE CAPSULE BY MOUTH ONCE A WEEK. FOR 12 WEEKS. 07/05/17   Alfonse Spruce, FNP    Family History Family History  Problem Relation Age of Onset  . Breast cancer Mother 82       was a smoker, alcohol drinker in early life   .  Breast cancer Maternal Aunt   . Cancer Maternal Grandfather   . Breast cancer Maternal Aunt     Social History Social History   Tobacco Use  . Smoking status: Current Every Day Smoker    Packs/day: 0.25    Years: 10.00    Pack years: 2.50    Types: Cigarettes  . Smokeless tobacco: Never Used  . Tobacco comment: 2-3 cigs/day  Substance Use Topics  . Alcohol use: No    Comment: social drinker  . Drug use: Yes    Types: Marijuana    Comment: occ     Allergies   Patient has no known allergies.   Review of Systems Review of Systems  Constitutional: Negative for chills and fever.  Musculoskeletal: Positive for arthralgias and neck pain. Negative for joint swelling and neck stiffness.  Skin: Negative for rash.  Neurological: Negative for headaches.     Physical Exam Updated Vital Signs BP (!) 143/73 (BP Location:  Right Arm)   Pulse 81   Temp 98.8 F (37.1 C) (Oral)   Resp 18   SpO2 100%   Physical Exam Vitals signs and nursing note reviewed.  Constitutional:      Appearance: She is not ill-appearing.  HENT:     Head: Normocephalic and atraumatic.  Eyes:     Conjunctiva/sclera: Conjunctivae normal.  Cardiovascular:     Rate and Rhythm: Normal rate and regular rhythm.  Pulmonary:     Effort: Pulmonary effort is normal.     Breath sounds: Normal breath sounds.  Musculoskeletal:     Comments: No obvious swelling or deformity noted to right shoulder. Pt has tenderness to palpation to diffuse right shoulder as well as right lateral neck along trapezius muscle with muscle spasms. No C spine midline tenderness. ROM intact although limited due to pain. Strength 4/5 compared to left upper extremity. Sensation intact throughout. Good distal pulses.   Skin:    General: Skin is warm and dry.     Coloration: Skin is not jaundiced.  Neurological:     Mental Status: She is alert.      ED Treatments / Results  Labs (all labs ordered are listed, but only abnormal results are displayed) Labs Reviewed - No data to display  EKG None  Radiology Dg Shoulder Right  Result Date: 02/18/2019 CLINICAL DATA:  Right shoulder pain since yesterday. No known injury. EXAM: RIGHT SHOULDER - 2+ VIEW COMPARISON:  None. FINDINGS: There is no evidence of fracture or dislocation. There is no evidence of arthropathy or other focal bone abnormality. Soft tissues are unremarkable. IMPRESSION: Normal examination. Electronically Signed   By: Claudie Revering M.D.   On: 02/18/2019 16:07    Procedures Procedures (including critical care time)  Medications Ordered in ED Medications - No data to display   Initial Impression / Assessment and Plan / ED Course  I have reviewed the triage vital signs and the nursing notes.  Pertinent labs & imaging results that were available during my care of the patient were reviewed by me  and considered in my medical decision making (see chart for details).    38 year old otherwise healthy female who presents to the ED today for right lateral neck and right shoulder pain, atraumatic. No midline spinal tenderness on exam. No obvious deformity. X ray obtained without acute findings. On exam patient tender along trapezius muscle with spasms. Will prescribe muscle relaxer today and advised to take Ibuprofen/Tylenol for pain. Will have patient follow up with  orthopedics if pain continues. She is in agreement with plan at this time and stable for discharge home.       Final Clinical Impressions(s) / ED Diagnoses   Final diagnoses:  Trapezius muscle spasm  Acute pain of right shoulder    ED Discharge Orders         Ordered    methocarbamol (ROBAXIN) 500 MG tablet  2 times daily     02/18/19 1621           Eustaquio Maize, PA-C 02/18/19 1812    Daleen Bo, MD 02/19/19 1133

## 2019-02-19 ENCOUNTER — Telehealth: Payer: Self-pay | Admitting: Family Medicine

## 2019-02-19 NOTE — Telephone Encounter (Signed)
Attempted to reach patient to inform. Unable to reach

## 2019-02-19 NOTE — Telephone Encounter (Signed)
Patient called stating she would like to get a referral in order to see an orthopedic specialist at [facility]. Please follow up.

## 2019-02-19 NOTE — Telephone Encounter (Signed)
Patient need to schedule to re-establish care with a PCP.

## 2019-02-21 DIAGNOSIS — M5412 Radiculopathy, cervical region: Secondary | ICD-10-CM | POA: Diagnosis not present

## 2019-02-27 DIAGNOSIS — M542 Cervicalgia: Secondary | ICD-10-CM | POA: Diagnosis not present

## 2019-03-13 DIAGNOSIS — M5412 Radiculopathy, cervical region: Secondary | ICD-10-CM | POA: Diagnosis not present

## 2019-03-13 DIAGNOSIS — M5022 Other cervical disc displacement, mid-cervical region, unspecified level: Secondary | ICD-10-CM | POA: Diagnosis not present

## 2019-03-13 DIAGNOSIS — M542 Cervicalgia: Secondary | ICD-10-CM | POA: Diagnosis not present

## 2019-03-26 DIAGNOSIS — M5412 Radiculopathy, cervical region: Secondary | ICD-10-CM | POA: Diagnosis not present

## 2019-03-26 DIAGNOSIS — Z6832 Body mass index (BMI) 32.0-32.9, adult: Secondary | ICD-10-CM | POA: Diagnosis not present

## 2019-04-04 ENCOUNTER — Encounter (HOSPITAL_COMMUNITY): Payer: Self-pay

## 2019-04-04 ENCOUNTER — Emergency Department (HOSPITAL_COMMUNITY)
Admission: EM | Admit: 2019-04-04 | Discharge: 2019-04-04 | Disposition: A | Discharge status: Home / Self Care | Payer: Medicaid Other | Attending: Emergency Medicine | Admitting: Emergency Medicine

## 2019-04-04 ENCOUNTER — Other Ambulatory Visit: Payer: Self-pay

## 2019-04-04 DIAGNOSIS — Z3490 Encounter for supervision of normal pregnancy, unspecified, unspecified trimester: Secondary | ICD-10-CM

## 2019-04-04 DIAGNOSIS — O2301 Infections of kidney in pregnancy, first trimester: Secondary | ICD-10-CM | POA: Diagnosis not present

## 2019-04-04 DIAGNOSIS — Z3A00 Weeks of gestation of pregnancy not specified: Secondary | ICD-10-CM | POA: Diagnosis not present

## 2019-04-04 DIAGNOSIS — Z79899 Other long term (current) drug therapy: Secondary | ICD-10-CM | POA: Diagnosis not present

## 2019-04-04 DIAGNOSIS — O219 Vomiting of pregnancy, unspecified: Secondary | ICD-10-CM | POA: Diagnosis present

## 2019-04-04 DIAGNOSIS — Z3A01 Less than 8 weeks gestation of pregnancy: Secondary | ICD-10-CM | POA: Diagnosis not present

## 2019-04-04 DIAGNOSIS — B9689 Other specified bacterial agents as the cause of diseases classified elsewhere: Secondary | ICD-10-CM | POA: Diagnosis not present

## 2019-04-04 DIAGNOSIS — O2300 Infections of kidney in pregnancy, unspecified trimester: Secondary | ICD-10-CM | POA: Diagnosis not present

## 2019-04-04 DIAGNOSIS — F17210 Nicotine dependence, cigarettes, uncomplicated: Secondary | ICD-10-CM | POA: Diagnosis not present

## 2019-04-04 DIAGNOSIS — O99331 Smoking (tobacco) complicating pregnancy, first trimester: Secondary | ICD-10-CM | POA: Insufficient documentation

## 2019-04-04 DIAGNOSIS — N12 Tubulo-interstitial nephritis, not specified as acute or chronic: Secondary | ICD-10-CM

## 2019-04-04 LAB — CBC
HCT: 33.2 % — ABNORMAL LOW (ref 36.0–46.0)
Hemoglobin: 10.9 g/dL — ABNORMAL LOW (ref 12.0–15.0)
MCH: 27.5 pg (ref 26.0–34.0)
MCHC: 32.8 g/dL (ref 30.0–36.0)
MCV: 83.6 fL (ref 80.0–100.0)
Platelets: 358 10*3/uL (ref 150–400)
RBC: 3.97 MIL/uL (ref 3.87–5.11)
RDW: 16.7 % — ABNORMAL HIGH (ref 11.5–15.5)
WBC: 18.9 10*3/uL — ABNORMAL HIGH (ref 4.0–10.5)
nRBC: 0 % (ref 0.0–0.2)

## 2019-04-04 LAB — URINALYSIS, ROUTINE W REFLEX MICROSCOPIC
Bilirubin Urine: NEGATIVE
Glucose, UA: NEGATIVE mg/dL
Ketones, ur: 20 mg/dL — AB
Nitrite: POSITIVE — AB
Protein, ur: 100 mg/dL — AB
Specific Gravity, Urine: 1.018 (ref 1.005–1.030)
WBC, UA: >50 WBC/hpf — ABNORMAL HIGH (ref 0–5)
pH: 5 (ref 5.0–8.0)

## 2019-04-04 LAB — COMPREHENSIVE METABOLIC PANEL
ALT: 33 U/L (ref 0–44)
AST: 20 U/L (ref 15–41)
Albumin: 3.1 g/dL — ABNORMAL LOW (ref 3.5–5.0)
Alkaline Phosphatase: 108 U/L (ref 38–126)
Anion gap: 15 (ref 5–15)
BUN: 6 mg/dL (ref 6–20)
CO2: 18 mmol/L — ABNORMAL LOW (ref 22–32)
Calcium: 9.3 mg/dL (ref 8.9–10.3)
Chloride: 96 mmol/L — ABNORMAL LOW (ref 98–111)
Creatinine, Ser: 0.76 mg/dL (ref 0.44–1.00)
GFR calc Af Amer: >60 mL/min (ref >60)
GFR calc non Af Amer: >60 mL/min (ref >60)
Glucose, Bld: 122 mg/dL — ABNORMAL HIGH (ref 70–99)
Potassium: 3.5 mmol/L (ref 3.5–5.1)
Sodium: 129 mmol/L — ABNORMAL LOW (ref 135–145)
Total Bilirubin: 0.6 mg/dL (ref 0.3–1.2)
Total Protein: 8.4 g/dL — ABNORMAL HIGH (ref 6.5–8.1)

## 2019-04-04 LAB — I-STAT BETA HCG BLOOD, ED (MC, WL, AP ONLY): I-stat hCG, quantitative: >2000 m[IU]/mL — ABNORMAL HIGH (ref <5)

## 2019-04-04 LAB — LACTIC ACID, PLASMA: Lactic Acid, Venous: 0.8 mmol/L (ref 0.5–1.9)

## 2019-04-04 LAB — LIPASE, BLOOD: Lipase: 20 U/L (ref 11–51)

## 2019-04-04 MED ORDER — IBUPROFEN 600 MG PO TABS: 600.0000 mg | ORAL_TABLET | Freq: Four times a day (QID) | ORAL | 0 refills | Status: DC | PRN

## 2019-04-04 MED ORDER — CEPHALEXIN 500 MG PO CAPS: 500.0000 mg | ORAL_CAPSULE | Freq: Four times a day (QID) | ORAL | 0 refills | Status: DC

## 2019-04-04 MED ORDER — ONDANSETRON 4 MG PO TBDP
4.0000 mg | ORAL_TABLET | Freq: Once | ORAL | Status: AC
  Administered 2019-04-04: 03:00:00 4 mg via ORAL
  Filled 2019-04-04: qty 1

## 2019-04-04 MED ORDER — ONDANSETRON 4 MG PO TBDP: 4.0000 mg | ORAL_TABLET | Freq: Three times a day (TID) | ORAL | 0 refills | Status: DC | PRN

## 2019-04-04 MED ORDER — SODIUM CHLORIDE 0.9% FLUSH
3.0000 mL | Freq: Once | INTRAVENOUS | Status: AC
  Administered 2019-04-04: 3 mL via INTRAVENOUS

## 2019-04-04 MED ORDER — ONDANSETRON HCL 4 MG/2ML IJ SOLN
4.0000 mg | Freq: Once | INTRAMUSCULAR | Status: AC
  Administered 2019-04-04: 4 mg via INTRAVENOUS
  Filled 2019-04-04: qty 2

## 2019-04-04 MED ORDER — SODIUM CHLORIDE 0.9 % IV BOLUS
1000.0000 mL | Freq: Once | INTRAVENOUS | Status: AC
  Administered 2019-04-04: 1000 mL via INTRAVENOUS

## 2019-04-04 MED ORDER — ACETAMINOPHEN 325 MG PO TABS
650.0000 mg | ORAL_TABLET | Freq: Once | ORAL | Status: AC | PRN
  Administered 2019-04-04: 03:00:00 650 mg via ORAL
  Filled 2019-04-04: qty 2

## 2019-04-04 MED ORDER — SODIUM CHLORIDE 0.9 % IV SOLN
1.0000 g | Freq: Once | INTRAVENOUS | Status: AC
  Administered 2019-04-04: 06:00:00 1 g via INTRAVENOUS
  Filled 2019-04-04: qty 10

## 2019-04-04 NOTE — ED Notes (Signed)
Family at bedside. 

## 2019-04-04 NOTE — ED Notes (Signed)
Pt ambulated to the restroom with a steady gait.

## 2019-04-04 NOTE — Discharge Instructions (Signed)
It is important to take your antibiotics like they are prescribed until they are gone. Take Zofran for nausea as needed and ibuprofen and Tylenol every 6 hours for control of fever and discomfort.   Return to the emergency department with any uncontrolled fever, uncontrolled vomiting, severe pain or new concern.

## 2019-04-04 NOTE — ED Provider Notes (Addendum)
[facility] EMERGENCY DEPARTMENT Provider Note   CSN: [id number] Arrival date & time: 04/04/19  0251     History   Chief Complaint Chief Complaint  Patient presents with  . Abdominal Pain    HPI Denise Moses is a 38 y.o. female.     Patient to ED with right flank pain, nausea, vomiting, dysuria, fever x 2 days. She has been taking Tylenol without improvement. She states she reports her LMP as 03/28/19 and denies current vaginal bleeding or discharge. No cough, congestion, chest pain, or SOB. She reports having been unable to tolerate anything by mouth in the last 24 hours.   The history is provided by the patient. No language interpreter was used.  Abdominal Pain Associated symptoms: dysuria, nausea and vomiting   Associated symptoms: no chills, no fever, no vaginal bleeding and no vaginal discharge     Past Medical History:  Diagnosis Date  . Anemia   . Elective abortion   . Heart murmur   . Herpes   . Infection    UTI  . Pregnancy induced hypertension    2001    Patient Active Problem List   Diagnosis Date Noted  . Supervision of normal pregnancy, antepartum 11/27/2018  . Vitamin D deficiency 07/05/2017  . Iron deficiency anemia 07/05/2017  . Ruptured left tubal ectopic pregnancy causing hemoperitoneum 06/09/2016  . 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 Recurrent boils 04/13/2016  . Family history of breast cancer in first degree relative 04/13/2016  . Smoker 07/03/2014    Past Surgical History:  Procedure Laterality Date  . CHOLECYSTECTOMY    . LAPAROSCOPY N/A 06/09/2016   Procedure: LAPAROSCOPY OPERATIVE;  Surgeon: Osborne Oman, MD;  Location: [facility];  Service: Gynecology;  Laterality: N/A;  ectopic   . THERAPEUTIC ABORTION     x3  . UNILATERAL SALPINGECTOMY Left 06/09/2016   Procedure: UNILATERAL SALPINGECTOMY, REMOVAL OF IUD;  Surgeon: Osborne Oman, MD;  Location: [facility];  Service: Gynecology;  Laterality: Left;      OB History    Gravida  11   Para  5   Term  4   Preterm  1   AB  5   Living  5     SAB  2   TAB  3   Ectopic      Multiple      Live Births  5            Home Medications    Prior to Admission medications   Medication Sig Start Date End Date Taking? Authorizing Provider  Acetaminophen (TYLENOL PO) Take 2 tablets by mouth every 6 (six) hours as needed (pain).    [provider]  adapalene (DIFFERIN) 0.1 % gel Apply topically at bedtime. Use as directed. 07/04/17   Alfonse Spruce, FNP  ferrous sulfate 325 (65 FE) MG EC tablet Take 1 tablet (325 mg total) by mouth 3 (three) times daily with meals. 07/05/17   Alfonse Spruce, FNP  methocarbamol (ROBAXIN) 500 MG tablet Take 1 tablet (500 mg total) by mouth 2 (two) times daily. 02/18/19   Alroy Bailiff, Margaux, PA-C  Soap & Cleansers (CETAPHIL GENTLE CLEANSER) LIQD Apply 1 application topically 2 (two) times daily. Use as directed. 06/30/17   Alfonse Spruce, FNP  Vitamin D, Ergocalciferol, (DRISDOL) 50000 units CAPS capsule TAKE ONE CAPSULE BY MOUTH ONCE A WEEK. FOR 12 WEEKS. 07/05/17   Alfonse Spruce, FNP    Family History  Family History  Problem Relation Age of Onset  . Breast cancer Mother 43       was a smoker, alcohol drinker in early life   . Breast cancer Maternal Aunt   . Cancer Maternal Grandfather   . Breast cancer Maternal Aunt     Social History Social History   Tobacco Use  . Smoking status: Current Every Day Smoker    Packs/day: 0.25    Years: 10.00    Pack years: 2.50    Types: Cigarettes  . Smokeless tobacco: Never Used  . Tobacco comment: 2-3 cigs/day  Substance Use Topics  . Alcohol use: No    Comment: social drinker  . Drug use: Yes    Types: Marijuana    Comment: occ     Allergies   Patient has no known allergies.   Review of Systems Review of Systems  Constitutional: Negative for chills and fever.  HENT: Negative.   Respiratory: Negative.    Cardiovascular: Negative.   Gastrointestinal: Positive for abdominal pain, nausea and vomiting.  Genitourinary: Positive for dysuria and flank pain. Negative for vaginal bleeding and vaginal discharge.  Skin: Negative.   Neurological: Negative.      Physical Exam Updated Vital Signs BP 134/70   Pulse 82   Temp 98 F (36.7 C)   Resp 20   SpO2 100%   Physical Exam Vitals signs and nursing note reviewed.  Constitutional:      Appearance: She is well-developed.  HENT:     Head: Normocephalic.  Neck:     Musculoskeletal: Normal range of motion and neck supple.  Cardiovascular:     Rate and Rhythm: Normal rate and regular rhythm.  Pulmonary:     Effort: Pulmonary effort is normal.     Breath sounds: Normal breath sounds.  Abdominal:     General: Bowel sounds are normal.     Palpations: Abdomen is soft.     Tenderness: There is no abdominal tenderness. There is right CVA tenderness. There is no guarding or rebound.  Musculoskeletal: Normal range of motion.  Skin:    General: Skin is warm and dry.     Findings: No rash.  Neurological:     Mental Status: She is alert.     Cranial Nerves: No cranial nerve deficit.      ED Treatments / Results  Labs (all labs ordered are listed, but only abnormal results are displayed) Labs Reviewed  COMPREHENSIVE METABOLIC PANEL - Abnormal; Notable for the following components:      Result Value   Sodium 129 (*)    Chloride 96 (*)    CO2 18 (*)    Glucose, Bld 122 (*)    Total Protein 8.4 (*)    Albumin 3.1 (*)    All other components within normal limits  CBC - Abnormal; Notable for the following components:   WBC 18.9 (*)    Hemoglobin 10.9 (*)    HCT 33.2 (*)    RDW 16.7 (*)    All other components within normal limits  URINALYSIS, ROUTINE W REFLEX MICROSCOPIC - Abnormal; Notable for the following components:   Color, Urine AMBER (*)    APPearance CLOUDY (*)    Hgb urine dipstick SMALL (*)    Ketones, ur 20 (*)     Protein, ur 100 (*)    Nitrite POSITIVE (*)    Leukocytes,Ua LARGE (*)    WBC, UA >50 (*)    Bacteria, UA RARE (*)  All other components within normal limits  I-STAT BETA HCG BLOOD, ED ([facility], [facility], [facility] ONLY) - Abnormal; Notable for the following components:   I-stat hCG, quantitative >2,000.0 (*)    All other components within normal limits  LIPASE, BLOOD  LACTIC ACID, PLASMA  LACTIC ACID, PLASMA    EKG None  Radiology No results found.  Procedures Procedures (including critical care time)  Medications Ordered in ED Medications  sodium chloride 0.9 % bolus 1,000 mL (has no administration in time range)  sodium chloride flush (NS) 0.9 % injection 3 mL (3 mLs Intravenous Given 04/04/19 0523)  acetaminophen (TYLENOL) tablet 650 mg (650 mg Oral Given 04/04/19 0310)  ondansetron (ZOFRAN-ODT) disintegrating tablet 4 mg (4 mg Oral Given 04/04/19 0310)  cefTRIAXone (ROCEPHIN) 1 g in sodium chloride 0.9 % 100 mL IVPB (1 g Intravenous New Bag/Given 04/04/19 0552)  ondansetron (ZOFRAN) injection 4 mg (4 mg Intravenous Given 04/04/19 0551)     Initial Impression / Assessment and Plan / ED Course  I have reviewed the triage vital signs and the nursing notes.  Pertinent labs & imaging results that were available during my care of the patient were reviewed by me and considered in my medical decision making (see chart for details).        Patient to ED with right flank pain, dysuria, fever, nausea and vomiting x 2 days.   She is nontoxic in appearance. Febrile and tachycardic on arrival, normotensive. UA shows evidence of infection and with symptoms is felt to indicate pyelonephritis. No RLQ abdominal pain to suggest appendicitis. No vaginal discharge or vaginal bleeding to suggest PID or pelvic infection.   She is found to be pregnant and was unaware of potential pregnancy.   Rocephin started. She is given Zofran for nausea. PO challenge causes recurrent nausea but no vomiting. IV bolus running,  lactic acid pending. Will continue to observe.   Lactic acid negative. She is feeling better after IV fluids. Tolerating PO's. She can be discharged home with return precautions.   Final Clinical Impressions(s) / ED Diagnoses   Final diagnoses:  None   1. Pyelonephritis 2. Pregnancy   ED Discharge Orders    None       Charlann Lange, PA-C 04/04/19 0718    Charlann Lange, PA-C 04/04/19 [id number]    Merrily Pew, MD 04/05/19 [phone]

## 2019-04-04 NOTE — ED Triage Notes (Signed)
Pt states that she has been having RLQ abd pain today with n/v, fevers and cold chills.

## 2019-04-11 ENCOUNTER — Encounter: Payer: Self-pay | Admitting: *Deleted

## 2019-04-12 ENCOUNTER — Telehealth: Payer: Self-pay | Admitting: Neurology

## 2019-04-12 ENCOUNTER — Ambulatory Visit: Payer: Medicaid Other | Admitting: Neurology

## 2019-04-12 ENCOUNTER — Other Ambulatory Visit: Payer: Self-pay

## 2019-04-12 ENCOUNTER — Encounter: Payer: Self-pay | Admitting: Neurology

## 2019-04-12 VITALS — BP 112/77 | HR 76 | Temp 97.5°F | Ht 66.0 in | Wt 200.5 lb

## 2019-04-12 DIAGNOSIS — R202 Paresthesia of skin: Secondary | ICD-10-CM

## 2019-04-12 DIAGNOSIS — G959 Disease of spinal cord, unspecified: Secondary | ICD-10-CM | POA: Diagnosis not present

## 2019-04-12 MED ORDER — GABAPENTIN 300 MG PO CAPS: 300.0000 mg | ORAL_CAPSULE | Freq: Three times a day (TID) | ORAL | 6 refills | Status: DC

## 2019-04-12 NOTE — Telephone Encounter (Signed)
medicaid order sent to [facility]. They will obtain the auth and reach out to the patient to schedule.  °

## 2019-04-12 NOTE — Progress Notes (Signed)
PATIENT: Denise Moses DOB: [birth year]  Chief Complaint  Patient presents with  . r/o MS    She is here with her significant other, Denise Moses. She is here to r/o MS.  Recent abnormal cervical MRI.  Reports neck/right arm and paresthesias into right hand.  She takes gabapentin 300mg , as needed, which has been helpful for the discomfort.   . Orthopaedics    Renette Butters, MD  . PCP    Alfonse Spruce, FNP     HISTORICAL  Denise Moses is 38 year old female, seen in request by orthopedic surgeon Dr. Edmonia Lynch for evaluation of possible multiple sclerosis, her primary care physician is PA Fredia Beets, she is accompanied by her significant others at today's clinic visit.  I have reviewed and summarized the referring note from the referring physician.  She is currently [redacted] weeks pregnant, per patient, she is going to have a elective abortion,  In July 2020, she had a sudden onset neck pain, radiating pain to bilateral shoulder, bilateral fingertips may 2, 3, fourth fingertips, also developed mild weak grip, at the peak of her difficulty, she also described difficulty walking, urinary urgency, incontinence, she was given a prescription of steroid, and gabapentin, which has helped her symptoms some, but still not back to her baseline, she still has bilateral upper extremity paresthesia, no longer have significant gait abnormality, lower extremity paresthesia, she denies visual loss.  I personally reviewed MRI of cervical spine on February 27, 2019, signal abnormality within the cord at C2,  REVIEW OF SYSTEMS: Full 14 system review of systems performed and notable only for as above All other review of systems were negative.  ALLERGIES: No Known Allergies  HOME MEDICATIONS: Current Outpatient Medications  Medication Sig Dispense Refill  . cephALEXin (KEFLEX) 500 MG capsule Take 1 capsule (500 mg total) by mouth 4 (four) times daily. 40 capsule 0  . ferrous  sulfate 325 (65 FE) MG EC tablet Take 1 tablet (325 mg total) by mouth 3 (three) times daily with meals. 90 tablet 2  . gabapentin (NEURONTIN) 300 MG capsule Take 300 mg by mouth as needed.     . Vitamin D, Ergocalciferol, (DRISDOL) 50000 units CAPS capsule TAKE ONE CAPSULE BY MOUTH ONCE A WEEK. FOR 12 WEEKS. 12 capsule 0   No current facility-administered medications for this visit.     PAST MEDICAL HISTORY: Past Medical History:  Diagnosis Date  . Abnormal MRI, cervical spine   . Anemia   . Elective abortion   . Heart murmur   . Herpes   . Infection    UTI  . Pregnancy induced hypertension    2001  . Vitamin D deficiency     PAST SURGICAL HISTORY: Past Surgical History:  Procedure Laterality Date  . CHOLECYSTECTOMY    . LAPAROSCOPY N/A 06/09/2016   Procedure: LAPAROSCOPY OPERATIVE;  Surgeon: Osborne Oman, MD;  Location: [facility];  Service: Gynecology;  Laterality: N/A;  ectopic   . THERAPEUTIC ABORTION     x3  . UNILATERAL SALPINGECTOMY Left 06/09/2016   Procedure: UNILATERAL SALPINGECTOMY, REMOVAL OF IUD;  Surgeon: Osborne Oman, MD;  Location: [facility];  Service: Gynecology;  Laterality: Left;  . WISDOM TOOTH EXTRACTION      FAMILY HISTORY: Family History  Problem Relation Age of Onset  . Breast cancer Mother 8       was a smoker, alcohol drinker in early life   . Breast cancer Maternal Aunt   .  Cancer Maternal Grandfather        unsure of type  . Other Father        unsure of history  . Breast cancer Maternal Aunt     SOCIAL HISTORY: Social History   Socioeconomic History  . Marital status: Single    Spouse name: Not on file  . Number of children: 5  . Years of education: college  . Highest education level: Not on file  Occupational History  . Occupation: Unemployed  Social Needs  . Financial resource strain: Not on file  . Food insecurity    Worry: Not on file    Inability: Not on file  . Transportation needs    Medical: Not on file     Non-medical: Not on file  Tobacco Use  . Smoking status: Former Smoker    Packs/day: 0.25    Years: 10.00    Pack years: 2.50    Types: Cigarettes    Quit date: 03/2019    Years since quitting: 0.1  . Smokeless tobacco: Never Used  . Tobacco comment: 2-3 cigs/day  Substance and Sexual Activity  . Alcohol use: Yes    Comment: social drinker  . Drug use: Yes    Types: Marijuana    Comment: occasional  . Sexual activity: Yes  Lifestyle  . Physical activity    Days per week: Not on file    Minutes per session: Not on file  . Stress: Not on file  Relationships  . Social Herbalist on phone: Not on file    Gets together: Not on file    Attends religious service: Not on file    Active member of club or organization: Not on file    Attends meetings of clubs or organizations: Not on file    Relationship status: Not on file  . Intimate partner violence    Fear of current or ex partner: Not on file    Emotionally abused: Not on file    Physically abused: Not on file    Forced sexual activity: Not on file  Other Topics Concern  . Not on file  Social History Narrative   Lives at home with children.   Right-handed.   No daily caffeine use.     PHYSICAL EXAM   Vitals:   04/12/19 0721  BP: 112/77  Pulse: 76  Temp: (!) 97.5 F (36.4 C)  Weight: 200 lb 8 oz (90.9 kg)  Height: 5\' 6"  (1.676 m)    Not recorded      Body mass index is 32.36 kg/m.  PHYSICAL EXAMNIATION:  Gen: NAD, conversant, well nourised, well groomed                     Cardiovascular: Regular rate rhythm, no peripheral edema, warm, nontender. Eyes: Conjunctivae clear without exudates or hemorrhage Neck: Supple, no carotid bruits. Pulmonary: Clear to auscultation bilaterally   NEUROLOGICAL EXAM:  MENTAL STATUS: Speech:    Speech is normal; fluent and spontaneous with normal comprehension.  Cognition:     Orientation to time, place and person     Normal recent and remote memory      Normal Attention span and concentration     Normal Language, naming, repeating,spontaneous speech     Fund of knowledge   CRANIAL NERVES: CN II: Visual fields are full to confrontation. Fundoscopic exam is normal with sharp discs and no vascular changes. Pupils are round equal and briskly reactive to  light. CN III, IV, VI: extraocular movement are normal. No ptosis. CN V: Facial sensation is intact to pinprick in all 3 divisions bilaterally. Corneal responses are intact.  CN VII: Face is symmetric with normal eye closure and smile. CN VIII: Hearing is normal to causal conversation. CN IX, X: Palate elevates symmetrically. Phonation is normal. CN XI: Head turning and shoulder shrug are intact CN XII: Tongue is midline with normal movements and no atrophy.  MOTOR: There is no pronator drift of out-stretched arms. Muscle bulk and tone are normal. Muscle strength is normal.  REFLEXES: Reflexes are 3 and symmetric at the biceps, triceps, knees, and ankles. Plantar responses are flexor.  SENSORY: Intact to light touch, pinprick, positional sensation and vibratory sensation are intact in fingers and toes.  COORDINATION: Rapid alternating movements and fine finger movements are intact. There is no dysmetria on finger-to-nose and heel-knee-shin.    GAIT/STANCE: Posture is normal. Gait is steady with normal steps, base, arm swing, and turning. Heel and toe walking are normal. Tandem gait is normal.  Romberg is absent.   DIAGNOSTIC DATA (LABS, IMAGING, TESTING) - I reviewed patient records, labs, notes, testing and imaging myself where available.   ASSESSMENT AND PLAN  Tihesha Franklyn is a 38 y.o. female   Cervical myelopathy  Most worrisome for multiple sclerosis  Proceed with MRI of the brain,  Laboratory evaluations  She is currently [redacted] weeks pregnant   [name], M.D. Ph.D.  [facility] [address] [place], [place] [address] Ph: [phone] Fax: [phone]  CC: Renette Butters, MD

## 2019-04-18 ENCOUNTER — Telehealth: Payer: Self-pay | Admitting: Neurology

## 2019-04-18 LAB — HGB A1C W/O EAG: Hgb A1c MFr Bld: 5.5 % (ref 4.8–5.6)

## 2019-04-18 LAB — LYME, WESTERN BLOT, SERUM (REFLEXED)
IgG P28 Ab.: ABSENT
IgG P30 Ab.: ABSENT
IgG P39 Ab.: ABSENT
IgG P41 Ab.: ABSENT
IgG P45 Ab.: ABSENT
IgG P58 Ab.: ABSENT
IgG P66 Ab.: ABSENT
IgM P23 Ab.: ABSENT
IgM P39 Ab.: ABSENT
IgM P41 Ab.: ABSENT
Lyme IgG Wb: NEGATIVE
Lyme IgM Wb: NEGATIVE

## 2019-04-18 LAB — HEPATITIS PANEL, ACUTE
Hep A IgM: NEGATIVE
Hep B C IgM: NEGATIVE
Hep C Virus Ab: <0.1 s/co ratio (ref 0.0–0.9)
Hepatitis B Surface Ag: NEGATIVE

## 2019-04-18 LAB — ANGIOTENSIN CONVERTING ENZYME: Angio Convert Enzyme: 26 U/L (ref 14–82)

## 2019-04-18 LAB — HIV ANTIBODY (ROUTINE TESTING W REFLEX): HIV Screen 4th Generation wRfx: NONREACTIVE

## 2019-04-18 LAB — SEDIMENTATION RATE: Sed Rate: 112 mm/hr — ABNORMAL HIGH (ref 0–32)

## 2019-04-18 LAB — IRON AND TIBC
Iron Saturation: 21 % (ref 15–55)
Iron: 67 ug/dL (ref 27–159)
Total Iron Binding Capacity: 326 ug/dL (ref 250–450)
UIBC: 259 ug/dL (ref 131–425)

## 2019-04-18 LAB — PROTEIN ELECTROPHORESIS
A/G Ratio: 0.8 (ref 0.7–1.7)
Albumin ELP: 3.3 g/dL (ref 2.9–4.4)
Alpha 1: 0.4 g/dL (ref 0.0–0.4)
Alpha 2: 1 g/dL (ref 0.4–1.0)
Beta: 1.1 g/dL (ref 0.7–1.3)
Gamma Globulin: 1.7 g/dL (ref 0.4–1.8)
Globulin, Total: 4.1 g/dL — ABNORMAL HIGH (ref 2.2–3.9)
Total Protein: 7.4 g/dL (ref 6.0–8.5)

## 2019-04-18 LAB — RPR: RPR Ser Ql: NONREACTIVE

## 2019-04-18 LAB — COPPER, SERUM: Copper: 225 ug/dL — ABNORMAL HIGH (ref 72–166)

## 2019-04-18 LAB — FERRITIN: Ferritin: 78 ng/mL (ref 15–150)

## 2019-04-18 LAB — CK: Total CK: 24 U/L — ABNORMAL LOW (ref 32–182)

## 2019-04-18 LAB — TSH: TSH: 1.12 u[IU]/mL (ref 0.450–4.500)

## 2019-04-18 LAB — B. BURGDORFI ANTIBODIES: Lyme IgG/IgM Ab: 1.38 {ISR} — ABNORMAL HIGH (ref 0.00–0.90)

## 2019-04-18 LAB — VITAMIN D 25 HYDROXY (VIT D DEFICIENCY, FRACTURES): Vit D, 25-Hydroxy: 14.4 ng/mL — ABNORMAL LOW (ref 30.0–100.0)

## 2019-04-18 LAB — VITAMIN B12: Vitamin B-12: 533 pg/mL (ref 232–1245)

## 2019-04-18 LAB — FOLATE: Folate: 5.7 ng/mL (ref >3.0)

## 2019-04-18 LAB — ANA W/REFLEX IF POSITIVE: Anti Nuclear Antibody (ANA): NEGATIVE

## 2019-04-18 LAB — C-REACTIVE PROTEIN: CRP: 11 mg/L — ABNORMAL HIGH (ref 0–10)

## 2019-04-18 NOTE — Telephone Encounter (Signed)
I spoke to the patient and she verbalized understanding of her lab results.  She is in agreement to continue her multivitamin and add the recommended vitamin D supplement.

## 2019-04-18 NOTE — Telephone Encounter (Signed)
I failed to reach patient, please call her,  Laboratory evaluation showed positive Lyme antibody, but the Western blot was negative, also significantly elevated ESR, likely related to her pregnancy  She has evidence of vitamin D deficiency, level was 14, she would benefit from prenatal vitamin, vitamin D supplement 1000 units daily.

## 2019-04-27 DIAGNOSIS — M542 Cervicalgia: Secondary | ICD-10-CM | POA: Diagnosis not present

## 2019-04-27 DIAGNOSIS — M5022 Other cervical disc displacement, mid-cervical region, unspecified level: Secondary | ICD-10-CM | POA: Diagnosis not present

## 2019-04-27 DIAGNOSIS — M5412 Radiculopathy, cervical region: Secondary | ICD-10-CM | POA: Diagnosis not present

## 2019-04-29 ENCOUNTER — Other Ambulatory Visit: Payer: Self-pay

## 2019-04-29 ENCOUNTER — Ambulatory Visit
Admission: RE | Admit: 2019-04-29 | Discharge: 2019-04-29 | Disposition: A | Discharge status: Home / Self Care | Payer: Medicaid Other | Source: Ambulatory Visit | Attending: Neurology | Admitting: Neurology

## 2019-04-29 DIAGNOSIS — G959 Disease of spinal cord, unspecified: Secondary | ICD-10-CM | POA: Diagnosis not present

## 2019-04-29 DIAGNOSIS — R202 Paresthesia of skin: Secondary | ICD-10-CM

## 2019-04-30 ENCOUNTER — Encounter: Payer: Self-pay | Admitting: Neurology

## 2019-04-30 ENCOUNTER — Telehealth: Payer: Self-pay | Admitting: Neurology

## 2019-04-30 ENCOUNTER — Ambulatory Visit: Payer: Medicaid Other | Admitting: Neurology

## 2019-04-30 VITALS — BP 141/79 | HR 85 | Temp 98.1°F | Ht 66.0 in | Wt 213.0 lb

## 2019-04-30 DIAGNOSIS — E559 Vitamin D deficiency, unspecified: Secondary | ICD-10-CM | POA: Diagnosis not present

## 2019-04-30 DIAGNOSIS — G959 Disease of spinal cord, unspecified: Secondary | ICD-10-CM | POA: Diagnosis not present

## 2019-04-30 DIAGNOSIS — G35 Multiple sclerosis: Secondary | ICD-10-CM | POA: Insufficient documentation

## 2019-04-30 NOTE — Patient Instructions (Signed)
National MS society website  P.O.  Aubagio (teriflunomide)  Dimethyl Fumarate (dimethyl fumarate - generic equivalent of Tecfidera)  Gilenya (fingolimod)  Zeposia (ozanimod)  Infused medications  Ocrevus (ocrelizumab) Tysabri

## 2019-04-30 NOTE — Telephone Encounter (Signed)
Please call patient, MRI of the brain showed findings typical for multiple sclerosis, please verify her pregnancy status,  Further evaluation could including lumbar puncture, and to discuss treatment, please recommend National MS society website to her.

## 2019-04-30 NOTE — Telephone Encounter (Signed)
I spoke to the patient.  She has an appt for an elective abortion on 05/04/2019.  She is aware of her MRI results.  She would like to come in sooner to discuss findings with MD.  Dr. Krista Blue had an opening today and she accepted the appt.

## 2019-05-02 NOTE — Progress Notes (Signed)
PATIENT: Denise Moses DOB: April 10, 1981  Chief Complaint  Patient presents with  . Multiple Sclerosis    She is here to discuss her MRI brain results and treatment options.  She is scheduled for an elective abortion on 05/04/2019.     HISTORICAL  Denise Moses is 38 year old female, seen in request by orthopedic surgeon Dr. Edmonia Lynch for evaluation of possible multiple sclerosis, her primary care physician is [facility] Fredia Beets, she is accompanied by her significant others at today's clinic visit.  I have reviewed and summarized the referring note from the referring physician.  She is currently [redacted] weeks pregnant, per patient, she is going to have a elective abortion,  In July 2020, she had a sudden onset neck pain, radiating pain to bilateral shoulder, bilateral fingertips may 2, 3, fourth fingertips, also developed mild weak grip, at the peak of her difficulty, she also described difficulty walking, urinary urgency, incontinence, she was given a prescription of steroid, and gabapentin, which has helped her symptoms some, but still not back to her baseline, she still has bilateral upper extremity paresthesia, no longer have significant gait abnormality, lower extremity paresthesia, she denies visual loss.  I personally reviewed MRI of cervical spine on February 27, 2019, signal abnormality within the cord at C2,  UPDATE Sept 30 2020: She is scheduled for abortion on May 04, 2019, there is no new neurological symptoms,  We personally reviewed MRI of the brain without contrast April 29, 2019, multiple T2/flair hyperintensity in the pons, hemisphere, consistent with relapsing remitting multiple sclerosis,  Laboratory evaluation on April 12, 2019 showed decreased vitamin D 14.4, normal or negative angiotensin-converting enzyme level, ANA, 10, iron level, copper level was mildly elevated 225, hepatitis panel, A1c was 5.5, normal electrophoresis CPK was 24, ESR was  112, normal TSH, C-reactive protein was mildly elevated 11, normal folic acid, HIV, RPR, F81,  REVIEW OF SYSTEMS: Full 14 system review of systems performed and notable only for as above All other review of systems were negative.  ALLERGIES: No Known Allergies  HOME MEDICATIONS: Current Outpatient Medications  Medication Sig Dispense Refill  . cholecalciferol (VITAMIN D3) 25 MCG (1000 UT) tablet Take 1,000 Units by mouth daily.     No current facility-administered medications for this visit.     PAST MEDICAL HISTORY: Past Medical History:  Diagnosis Date  . Abnormal MRI, cervical spine   . Anemia   . Elective abortion   . Heart murmur   . Herpes   . Infection    UTI  . Pregnancy induced hypertension    2001  . Vitamin D deficiency     PAST SURGICAL HISTORY: Past Surgical History:  Procedure Laterality Date  . CHOLECYSTECTOMY    . LAPAROSCOPY N/A 06/09/2016   Procedure: LAPAROSCOPY OPERATIVE;  Surgeon: Osborne Oman, MD;  Location: [facility];  Service: Gynecology;  Laterality: N/A;  ectopic   . THERAPEUTIC ABORTION     x3  . UNILATERAL SALPINGECTOMY Left 06/09/2016   Procedure: UNILATERAL SALPINGECTOMY, REMOVAL OF IUD;  Surgeon: Osborne Oman, MD;  Location: [facility];  Service: Gynecology;  Laterality: Left;  . WISDOM TOOTH EXTRACTION      FAMILY HISTORY: Family History  Problem Relation Age of Onset  . Breast cancer Mother 97       was a smoker, alcohol drinker in early life   . Breast cancer Maternal Aunt   . Cancer Maternal Grandfather        unsure of  type  . Other Father        unsure of history  . Breast cancer Maternal Aunt     SOCIAL HISTORY: Social History   Socioeconomic History  . Marital status: Single    Spouse name: Not on file  . Number of children: 5  . Years of education: college  . Highest education level: Not on file  Occupational History  . Occupation: Unemployed  Social Needs  . Financial resource strain: Not on file  . Food  insecurity    Worry: Not on file    Inability: Not on file  . Transportation needs    Medical: Not on file    Non-medical: Not on file  Tobacco Use  . Smoking status: Former Smoker    Packs/day: 0.25    Years: 10.00    Pack years: 2.50    Types: Cigarettes    Quit date: 03/2019    Years since quitting: 0.1  . Smokeless tobacco: Never Used  . Tobacco comment: 2-3 cigs/day  Substance and Sexual Activity  . Alcohol use: Yes    Comment: social drinker  . Drug use: Yes    Types: Marijuana    Comment: occasional  . Sexual activity: Yes  Lifestyle  . Physical activity    Days per week: Not on file    Minutes per session: Not on file  . Stress: Not on file  Relationships  . Social Herbalist on phone: Not on file    Gets together: Not on file    Attends religious service: Not on file    Active member of club or organization: Not on file    Attends meetings of clubs or organizations: Not on file    Relationship status: Not on file  . Intimate partner violence    Fear of current or ex partner: Not on file    Emotionally abused: Not on file    Physically abused: Not on file    Forced sexual activity: Not on file  Other Topics Concern  . Not on file  Social History Narrative   Lives at home with children.   Right-handed.   No daily caffeine use.     PHYSICAL EXAM   Vitals:   04/30/19 1429  BP: (!) 141/79  Pulse: 85  Temp: 98.1 F (36.7 C)  Weight: 213 lb (96.6 kg)  Height: '5\' 6"'$  (1.676 m)    Not recorded      Body mass index is 34.38 kg/m.  PHYSICAL EXAMNIATION:  Gen: NAD, conversant, well nourised, well groomed                     Cardiovascular: Regular rate rhythm, no peripheral edema, warm, nontender. Eyes: Conjunctivae clear without exudates or hemorrhage Neck: Supple, no carotid bruits. Pulmonary: Clear to auscultation bilaterally   NEUROLOGICAL EXAM:  MENTAL STATUS: Speech:    Speech is normal; fluent and spontaneous with normal  comprehension.  Cognition:     Orientation to time, place and person     Normal recent and remote memory     Normal Attention span and concentration     Normal Language, naming, repeating,spontaneous speech     Fund of knowledge   CRANIAL NERVES: CN II: Visual fields are full to confrontation. Pupils are round equal and briskly reactive to light. CN III, IV, VI: extraocular movement are normal. No ptosis. CN V: Facial sensation is intact to pinprick in all 3 divisions bilaterally. Corneal  responses are intact.  CN VII: Face is symmetric with normal eye closure and smile. CN VIII: Hearing is normal to causal conversation. CN IX, X: Palate elevates symmetrically. Phonation is normal. CN XI: Head turning and shoulder shrug are intact CN XII: Tongue is midline with normal movements and no atrophy.  MOTOR: There is no pronator drift of out-stretched arms. Muscle bulk and tone are normal. Muscle strength is normal.  REFLEXES: Reflexes are 3 and symmetric at the biceps, triceps, knees, and ankles. Plantar responses are flexor.  SENSORY: Intact to light touch, pinprick, positional sensation and vibratory sensation are intact in fingers and toes.  COORDINATION: Rapid alternating movements and fine finger movements are intact. There is no dysmetria on finger-to-nose and heel-knee-shin.    GAIT/STANCE: Posture is normal. Gait is steady with normal steps, base, arm swing, and turning. Heel and toe walking are normal. Tandem gait is normal.  Romberg is absent.   DIAGNOSTIC DATA (LABS, IMAGING, TESTING) - I reviewed patient records, labs, notes, testing and imaging myself where available.   ASSESSMENT AND PLAN  Ambra Haverstick is a 38 y.o. female   Relapsing remitting multiple sclerosis  Diagnosis was confirmed by abnormal brain and spinal cord involvement  She is scheduled to have abortion May 04, 2019, will come back to discuss treatment option after that   Vitamin D  deficiency  Continue Vitamin D supplement  [name], M.D. Ph.D.  [facility] [address] [place], [place] [address] Ph: [phone] Fax: [phone]  CC: Renette Butters, MD

## 2019-05-15 ENCOUNTER — Ambulatory Visit: Payer: Medicaid Other | Admitting: Neurology

## 2019-06-11 ENCOUNTER — Telehealth: Payer: Self-pay | Admitting: Neurology

## 2019-06-11 NOTE — Telephone Encounter (Signed)
I spoke to patient.  She had an abortion on 05/04/2019 and is no longer pregnant.  She would like to move her appt to an earlier date to discuss starting medication for her MS.  She also mentions recently developing bilateral feet swelling.  She has not been evaluated for this problem yet.  Her appt has been rescheduled here to 06/12/2019.  Dr. Krista Blue may be able to advise on swelling but she may ultimately have see her PCP for treatment, depending on cause.

## 2019-06-11 NOTE — Telephone Encounter (Signed)
Pt states both feet are swollen and sore to walk on and she would like to discuss this with the RN or provider. Please advise.

## 2019-06-11 NOTE — Telephone Encounter (Signed)
Left message requesting a return call.

## 2019-06-12 ENCOUNTER — Telehealth: Payer: Self-pay | Admitting: Neurology

## 2019-06-12 ENCOUNTER — Other Ambulatory Visit: Payer: Self-pay

## 2019-06-12 ENCOUNTER — Ambulatory Visit: Payer: Medicaid Other | Admitting: Neurology

## 2019-06-12 ENCOUNTER — Encounter: Payer: Self-pay | Admitting: Neurology

## 2019-06-12 VITALS — BP 136/82 | HR 65 | Temp 97.2°F | Ht 66.0 in | Wt 227.5 lb

## 2019-06-12 DIAGNOSIS — G35 Multiple sclerosis: Secondary | ICD-10-CM | POA: Diagnosis not present

## 2019-06-12 DIAGNOSIS — R202 Paresthesia of skin: Secondary | ICD-10-CM | POA: Diagnosis not present

## 2019-06-12 MED ORDER — GABAPENTIN 300 MG PO CAPS: 300.0000 mg | ORAL_CAPSULE | Freq: Three times a day (TID) | ORAL | 11 refills | Status: DC

## 2019-06-12 NOTE — Progress Notes (Signed)
PATIENT: Enolia Koepke DOB: 06-30-1981  Chief Complaint  Patient presents with  . Multiple Sclerosis    Reports she terminated her pregnancy on 05/04/2019.  She would like to discuss treatment options for her MS.  She is concerned about pain and swelling in her bilateral feet.     HISTORICAL  Clyde Upshaw is 38 year old female, seen in request by orthopedic surgeon Dr. Edmonia Lynch for evaluation of possible multiple sclerosis, her primary care physician is [facility] Fredia Beets, she is accompanied by her significant others at today's clinic visit.  I have reviewed and summarized the referring note from the referring physician.  She is currently [redacted] weeks pregnant, per patient, she is going to have a elective abortion,  In July 2020, she had a sudden onset neck pain, radiating pain to bilateral shoulder, bilateral fingertips may 2, 3, fourth fingertips, also developed mild weak grip, at the peak of her difficulty, she also described difficulty walking, urinary urgency, incontinence, she was given a prescription of steroid, and gabapentin, which has helped her symptoms some, but still not back to her baseline, she still has bilateral upper extremity paresthesia, no longer have significant gait abnormality, lower extremity paresthesia, she denies visual loss.  I personally reviewed MRI of cervical spine on February 27, 2019, signal abnormality within the cord at C2,  UPDATE Sept 30 2020: She is scheduled for abortion on May 04, 2019, there is no new neurological symptoms,  We personally reviewed MRI of the brain without contrast April 29, 2019, multiple T2/flair hyperintensity in the pons, hemisphere, consistent with relapsing remitting multiple sclerosis,  Laboratory evaluation on April 12, 2019 showed decreased vitamin D 14.4, normal or negative angiotensin-converting enzyme level, ANA, 10, iron level, copper level was mildly elevated 225, hepatitis panel, A1c was  5.5, normal electrophoresis CPK was 24, ESR was 112, normal TSH, C-reactive protein was mildly elevated 11, normal folic acid, HIV, RPR, T03,  UPDATE Jun 12 2019: She had a elective abortion on May 04, 2019, we also called her primary care physician, she has lost the follow-up since 2018, missed multiple scheduled visit, was actually discharged by her primary care physician  She complains of worsening bilateral lower extremity swelling, burning pain, denies significant gait abnormality  We had extensive discussion with patient, and her significant others, she preferred to started ocrelizumab, emphasized with patient the importance to stay on contraceptives, will refer her to OB/GYN for options.  REVIEW OF SYSTEMS: Full 14 system review of systems performed and notable only for as above All other review of systems were negative.  ALLERGIES: No Known Allergies  HOME MEDICATIONS: Current Outpatient Medications  Medication Sig Dispense Refill  . cholecalciferol (VITAMIN D3) 25 MCG (1000 UT) tablet Take 1,000 Units by mouth daily.     No current facility-administered medications for this visit.     PAST MEDICAL HISTORY: Past Medical History:  Diagnosis Date  . Abnormal MRI, cervical spine   . Anemia   . Elective abortion   . Heart murmur   . Herpes   . Infection    UTI  . Pregnancy induced hypertension    2001  . Vitamin D deficiency     PAST SURGICAL HISTORY: Past Surgical History:  Procedure Laterality Date  . CHOLECYSTECTOMY    . LAPAROSCOPY N/A 06/09/2016   Procedure: LAPAROSCOPY OPERATIVE;  Surgeon: Osborne Oman, MD;  Location: [facility];  Service: Gynecology;  Laterality: N/A;  ectopic   . THERAPEUTIC ABORTION  x3  . UNILATERAL SALPINGECTOMY Left 06/09/2016   Procedure: UNILATERAL SALPINGECTOMY, REMOVAL OF IUD;  Surgeon: Osborne Oman, MD;  Location: [facility];  Service: Gynecology;  Laterality: Left;  . WISDOM TOOTH EXTRACTION      FAMILY HISTORY: Family  History  Problem Relation Age of Onset  . Breast cancer Mother 59       was a smoker, alcohol drinker in early life   . Breast cancer Maternal Aunt   . Cancer Maternal Grandfather        unsure of type  . Other Father        unsure of history  . Breast cancer Maternal Aunt     SOCIAL HISTORY: Social History   Socioeconomic History  . Marital status: Single    Spouse name: Not on file  . Number of children: 5  . Years of education: college  . Highest education level: Not on file  Occupational History  . Occupation: Unemployed  Social Needs  . Financial resource strain: Not on file  . Food insecurity    Worry: Not on file    Inability: Not on file  . Transportation needs    Medical: Not on file    Non-medical: Not on file  Tobacco Use  . Smoking status: Former Smoker    Packs/day: 0.25    Years: 10.00    Pack years: 2.50    Types: Cigarettes    Quit date: 03/2019    Years since quitting: 0.2  . Smokeless tobacco: Never Used  . Tobacco comment: 2-3 cigs/day  Substance and Sexual Activity  . Alcohol use: Yes    Comment: social drinker  . Drug use: Yes    Types: Marijuana    Comment: occasional  . Sexual activity: Yes  Lifestyle  . Physical activity    Days per week: Not on file    Minutes per session: Not on file  . Stress: Not on file  Relationships  . Social Herbalist on phone: Not on file    Gets together: Not on file    Attends religious service: Not on file    Active member of club or organization: Not on file    Attends meetings of clubs or organizations: Not on file    Relationship status: Not on file  . Intimate partner violence    Fear of current or ex partner: Not on file    Emotionally abused: Not on file    Physically abused: Not on file    Forced sexual activity: Not on file  Other Topics Concern  . Not on file  Social History Narrative   Lives at home with children.   Right-handed.   No daily caffeine use.     PHYSICAL  EXAM   Vitals:   06/12/19 0851  BP: 136/82  Pulse: 65  Temp: (!) 97.2 F (36.2 C)  Weight: 227 lb 8 oz (103.2 kg)  Height: '5\' 6"'$  (1.676 m)    Not recorded      Body mass index is 36.72 kg/m.  PHYSICAL EXAMNIATION:  Gen: NAD, conversant, well nourised, well groomed                     Cardiovascular: Regular rate rhythm, no peripheral edema, warm, nontender. Eyes: Conjunctivae clear without exudates or hemorrhage Neck: Supple, no carotid bruits. Pulmonary: Clear to auscultation bilaterally   NEUROLOGICAL EXAM:  MENTAL STATUS: Speech:    Speech is normal; fluent and  spontaneous with normal comprehension.  Cognition:     Orientation to time, place and person     Normal recent and remote memory     Normal Attention span and concentration     Normal Language, naming, repeating,spontaneous speech     Fund of knowledge   CRANIAL NERVES: CN II: Visual fields are full to confrontation. Pupils are round equal and briskly reactive to light. CN III, IV, VI: extraocular movement are normal. No ptosis. CN V: Facial sensation is intact to pinprick in all 3 divisions bilaterally. Corneal responses are intact.  CN VII: Face is symmetric with normal eye closure and smile. CN VIII: Hearing is normal to causal conversation. CN IX, X: Palate elevates symmetrically. Phonation is normal. CN XI: Head turning and shoulder shrug are intact CN XII: Tongue is midline with normal movements and no atrophy.  MOTOR: There is no pronator drift of out-stretched arms. Muscle bulk and tone are normal. Muscle strength is normal.  REFLEXES: Reflexes are 3 and symmetric at the biceps, triceps, knees, and ankles. Plantar responses are flexor.  SENSORY: Intact to light touch, pinprick, positional sensation and vibratory sensation are intact in fingers and toes.  COORDINATION: Rapid alternating movements and fine finger movements are intact. There is no dysmetria on finger-to-nose and heel-knee-shin.     GAIT/STANCE: Mild antalgic    DIAGNOSTIC DATA (LABS, IMAGING, TESTING) - I reviewed patient records, labs, notes, testing and imaging myself where available.   ASSESSMENT AND PLAN  Rosaria Kubin is a 38 y.o. female   Relapsing remitting multiple sclerosis  She had a elective abortion on May 04, 2019  Refer her to OB/GYN for contraceptive choice  Patient preferred to start ocrelizumab, laboratory evaluations  Also refer her to fluoroscopy guided lumbar puncture   Vitamin D deficiency  Continue Vitamin D supplement  [name], M.D. Ph.D.  [facility] [address] [place], [place] [address] Ph: [phone] Fax: [phone]  CC: Renette Butters, MD

## 2019-06-12 NOTE — Telephone Encounter (Signed)
Dr. Lamona Curl sorry but you can't  refer her to OBGYN her PCP has to do that because of her insurance . I will call Fredia Beets FNP and relay message about referral . I also called patient to relay message to her. Her boyfriend answered and he will have her call me back when she gets off work.

## 2019-06-13 ENCOUNTER — Telehealth: Payer: Self-pay | Admitting: *Deleted

## 2019-06-13 NOTE — Telephone Encounter (Signed)
Medicaid denied Ocrevus for this paitient, stating she had to fail other medications first.  Per Dr. Krista Blue and Dr. Felecia Shelling, she may meet qualifications for the [facility] research study in our office.  I called the patient and she is interested in the study.  Our research dept will be reaching out to her.

## 2019-06-14 ENCOUNTER — Ambulatory Visit: Payer: Medicaid Other | Admitting: Neurology

## 2019-06-15 LAB — HEPATITIS B SURFACE ANTIBODY,QUALITATIVE: Hep B Surface Ab, Qual: NONREACTIVE

## 2019-06-15 LAB — VARICELLA ZOSTER ANTIBODY, IGG: Varicella zoster IgG: 540 index (ref >165)

## 2019-06-15 LAB — QUANTIFERON-TB GOLD PLUS
QuantiFERON Mitogen Value: >10 IU/mL
QuantiFERON Nil Value: 0.02 IU/mL
QuantiFERON TB1 Ag Value: 0.01 IU/mL
QuantiFERON TB2 Ag Value: 0.01 IU/mL
QuantiFERON-TB Gold Plus: NEGATIVE

## 2019-06-15 LAB — HEPATITIS C ANTIBODY: Hep C Virus Ab: <0.1 s/co ratio (ref 0.0–0.9)

## 2019-06-15 LAB — HEPATITIS B CORE ANTIBODY, TOTAL: Hep B Core Total Ab: NEGATIVE

## 2019-06-18 ENCOUNTER — Ambulatory Visit
Admission: RE | Admit: 2019-06-18 | Discharge: 2019-06-18 | Disposition: A | Discharge status: Home / Self Care | Payer: Medicaid Other | Source: Ambulatory Visit | Attending: Neurology | Admitting: Neurology

## 2019-06-18 ENCOUNTER — Other Ambulatory Visit: Payer: Self-pay

## 2019-06-18 ENCOUNTER — Telehealth: Payer: Self-pay | Admitting: *Deleted

## 2019-06-18 DIAGNOSIS — M5000 Cervical disc disorder with myelopathy, unspecified cervical region: Secondary | ICD-10-CM | POA: Diagnosis not present

## 2019-06-18 DIAGNOSIS — G35 Multiple sclerosis: Secondary | ICD-10-CM | POA: Diagnosis not present

## 2019-06-18 DIAGNOSIS — R202 Paresthesia of skin: Secondary | ICD-10-CM

## 2019-06-18 NOTE — Discharge Instructions (Signed)

## 2019-06-18 NOTE — Progress Notes (Signed)
Blood obtained from pt's R hand for LP labs. Pt tolerated procedure well. Site is unremarkable.

## 2019-06-18 NOTE — Telephone Encounter (Signed)
I was able to speak to the patient and she verbalized understanding of her lab results.

## 2019-06-18 NOTE — Telephone Encounter (Signed)
-----   Message from [name], MD sent at 06/18/2019  4:16 PM EST ----- Please call patient for normal laboratory result

## 2019-06-20 NOTE — Telephone Encounter (Signed)
Patient never called me back and I called her back patient does not want to go back to her PCP . Dr. Krista Blue can place a referral for PCP but I don't no if it will work I will submit but normally Neurology cant place referrals for PCP because of her insurance . I relayed to patient she really needed  To reach of to [facility] for her resources.

## 2019-06-25 ENCOUNTER — Telehealth: Payer: Self-pay | Admitting: *Deleted

## 2019-06-25 NOTE — Telephone Encounter (Signed)
Labs collected 06/12/2019:  JCV - 2.99 (H)  Printed result provided to Dr. Krista Blue for review then will be sent for scanning.

## 2019-07-02 ENCOUNTER — Ambulatory Visit: Payer: Medicaid Other | Admitting: Neurology

## 2019-07-17 LAB — MULTIPLE SCLEROSIS PANEL 2
Albumin Serum: 4.3 g/dL (ref 3.5–5.2)
Albumin, CSF: 22.9 mg/dL (ref 8.0–42.0)
CNS-IgG Synthesis Rate: 13.5 mg/24 h — ABNORMAL HIGH (ref <=3.3)
IgG (Immunoglobin G), Serum: 1500 mg/dL (ref 600–1640)
IgG Total CSF: 7.4 mg/dL (ref 0.8–7.7)
IgG-Index: 0.93 — ABNORMAL HIGH (ref <0.66)
Myelin Basic Protein: <2 mcg/L (ref 2.0–4.0)

## 2019-07-17 LAB — PROTEIN, CSF: Total Protein, CSF: 49 mg/dL — ABNORMAL HIGH (ref 15–45)

## 2019-07-17 LAB — GRAM STAIN
MICRO NUMBER:: 1104146
SPECIMEN QUALITY:: ADEQUATE

## 2019-07-17 LAB — CSF CELL COUNT WITH DIFFERENTIAL
Basophils, %: 0 %
Lymphs, CSF: 90 % — ABNORMAL HIGH (ref 40–80)
Monocyte/Macrophage: 10 % — ABNORMAL LOW (ref 15–45)
RBC Count, CSF: 2 cells/uL — ABNORMAL HIGH
Segmented Neutrophils-CSF: 0 % (ref 0–6)
WBC, CSF: 7 cells/uL — ABNORMAL HIGH (ref 0–5)

## 2019-07-17 LAB — GLUCOSE, CSF: Glucose, CSF: 54 mg/dL (ref 40–80)

## 2019-07-17 LAB — FUNGUS CULTURE W SMEAR
MICRO NUMBER:: 1104147
SMEAR:: NONE SEEN
SPECIMEN QUALITY:: ADEQUATE

## 2019-07-17 LAB — VDRL, CSF: VDRL Quant, CSF: NONREACTIVE

## 2019-08-15 ENCOUNTER — Other Ambulatory Visit: Payer: Self-pay

## 2019-08-15 ENCOUNTER — Encounter (HOSPITAL_COMMUNITY): Payer: Self-pay

## 2019-08-15 ENCOUNTER — Inpatient Hospital Stay (HOSPITAL_COMMUNITY)
Admission: EM | Admit: 2019-08-15 | Discharge: 2019-08-16 | Disposition: A | Discharge status: Home / Self Care | Payer: Medicaid Other | Attending: Obstetrics & Gynecology | Admitting: Obstetrics & Gynecology

## 2019-08-15 DIAGNOSIS — Z3A01 Less than 8 weeks gestation of pregnancy: Secondary | ICD-10-CM | POA: Insufficient documentation

## 2019-08-15 DIAGNOSIS — O98311 Other infections with a predominantly sexual mode of transmission complicating pregnancy, first trimester: Secondary | ICD-10-CM | POA: Diagnosis not present

## 2019-08-15 DIAGNOSIS — R109 Unspecified abdominal pain: Secondary | ICD-10-CM | POA: Diagnosis not present

## 2019-08-15 DIAGNOSIS — E559 Vitamin D deficiency, unspecified: Secondary | ICD-10-CM | POA: Insufficient documentation

## 2019-08-15 DIAGNOSIS — O26891 Other specified pregnancy related conditions, first trimester: Secondary | ICD-10-CM | POA: Diagnosis not present

## 2019-08-15 DIAGNOSIS — Z3491 Encounter for supervision of normal pregnancy, unspecified, first trimester: Secondary | ICD-10-CM

## 2019-08-15 DIAGNOSIS — O99891 Other specified diseases and conditions complicating pregnancy: Secondary | ICD-10-CM | POA: Diagnosis present

## 2019-08-15 DIAGNOSIS — Z8759 Personal history of other complications of pregnancy, childbirth and the puerperium: Secondary | ICD-10-CM | POA: Diagnosis not present

## 2019-08-15 DIAGNOSIS — Z98818 Other dental procedure status: Secondary | ICD-10-CM | POA: Diagnosis not present

## 2019-08-15 DIAGNOSIS — O99281 Endocrine, nutritional and metabolic diseases complicating pregnancy, first trimester: Secondary | ICD-10-CM | POA: Insufficient documentation

## 2019-08-15 DIAGNOSIS — Z79899 Other long term (current) drug therapy: Secondary | ICD-10-CM | POA: Diagnosis not present

## 2019-08-15 DIAGNOSIS — A5901 Trichomonal vulvovaginitis: Secondary | ICD-10-CM | POA: Diagnosis not present

## 2019-08-15 DIAGNOSIS — O99321 Drug use complicating pregnancy, first trimester: Secondary | ICD-10-CM | POA: Insufficient documentation

## 2019-08-15 DIAGNOSIS — O09522 Supervision of elderly multigravida, second trimester: Secondary | ICD-10-CM | POA: Insufficient documentation

## 2019-08-15 DIAGNOSIS — F1221 Cannabis dependence, in remission: Secondary | ICD-10-CM | POA: Diagnosis not present

## 2019-08-15 DIAGNOSIS — Z9049 Acquired absence of other specified parts of digestive tract: Secondary | ICD-10-CM | POA: Insufficient documentation

## 2019-08-15 DIAGNOSIS — Z87891 Personal history of nicotine dependence: Secondary | ICD-10-CM | POA: Insufficient documentation

## 2019-08-15 DIAGNOSIS — O208 Other hemorrhage in early pregnancy: Secondary | ICD-10-CM | POA: Diagnosis not present

## 2019-08-15 NOTE — ED Triage Notes (Signed)
Pt states that she has been having dysuria, discharge with odor and pelvic pain

## 2019-08-16 ENCOUNTER — Encounter (HOSPITAL_COMMUNITY): Payer: Self-pay | Admitting: Obstetrics & Gynecology

## 2019-08-16 ENCOUNTER — Inpatient Hospital Stay (HOSPITAL_COMMUNITY): Payer: Medicaid Other

## 2019-08-16 DIAGNOSIS — R109 Unspecified abdominal pain: Secondary | ICD-10-CM | POA: Diagnosis not present

## 2019-08-16 DIAGNOSIS — O208 Other hemorrhage in early pregnancy: Secondary | ICD-10-CM | POA: Diagnosis not present

## 2019-08-16 DIAGNOSIS — O26891 Other specified pregnancy related conditions, first trimester: Secondary | ICD-10-CM

## 2019-08-16 DIAGNOSIS — Z3A01 Less than 8 weeks gestation of pregnancy: Secondary | ICD-10-CM | POA: Diagnosis not present

## 2019-08-16 LAB — HCG, QUANTITATIVE, PREGNANCY: hCG, Beta Chain, Quant, S: 65451 m[IU]/mL — ABNORMAL HIGH (ref <5)

## 2019-08-16 LAB — URINALYSIS, ROUTINE W REFLEX MICROSCOPIC
Bilirubin Urine: NEGATIVE
Glucose, UA: NEGATIVE mg/dL
Hgb urine dipstick: NEGATIVE
Ketones, ur: NEGATIVE mg/dL
Nitrite: NEGATIVE
Protein, ur: 30 mg/dL — AB
Specific Gravity, Urine: 1.017 (ref 1.005–1.030)
WBC, UA: >50 WBC/hpf — ABNORMAL HIGH (ref 0–5)
pH: 7 (ref 5.0–8.0)

## 2019-08-16 LAB — CBC
HCT: 37.3 % (ref 36.0–46.0)
Hemoglobin: 12.1 g/dL (ref 12.0–15.0)
MCH: 27.5 pg (ref 26.0–34.0)
MCHC: 32.4 g/dL (ref 30.0–36.0)
MCV: 84.8 fL (ref 80.0–100.0)
Platelets: 269 10*3/uL (ref 150–400)
RBC: 4.4 MIL/uL (ref 3.87–5.11)
RDW: 16.9 % — ABNORMAL HIGH (ref 11.5–15.5)
WBC: 10 10*3/uL (ref 4.0–10.5)
nRBC: 0 % (ref 0.0–0.2)

## 2019-08-16 LAB — WET PREP, GENITAL
Clue Cells Wet Prep HPF POC: NONE SEEN
Sperm: NONE SEEN
Yeast Wet Prep HPF POC: NONE SEEN

## 2019-08-16 LAB — GC/CHLAMYDIA PROBE AMP (~~LOC~~) NOT AT ARMC
Chlamydia: NEGATIVE
Comment: NEGATIVE
Comment: NORMAL
Neisseria Gonorrhea: NEGATIVE

## 2019-08-16 LAB — I-STAT BETA HCG BLOOD, ED (MC, WL, AP ONLY): I-stat hCG, quantitative: >2000 m[IU]/mL — ABNORMAL HIGH (ref <5)

## 2019-08-16 LAB — HIV ANTIBODY (ROUTINE TESTING W REFLEX): HIV Screen 4th Generation wRfx: NONREACTIVE

## 2019-08-16 MED ORDER — METRONIDAZOLE 500 MG PO TABS
2000.0000 mg | ORAL_TABLET | Freq: Once | ORAL | Status: AC
  Administered 2019-08-16: 2000 mg via ORAL
  Filled 2019-08-16: qty 4

## 2019-08-16 NOTE — MAU Provider Note (Signed)
Chief Complaint: Vaginal Discharge   None     SUBJECTIVE HPI: Denise Moses is a 39 y.o. [id number] at [redacted]w[redacted]d by LMP who presents to maternity admissions sent from [facility] for dysuria and abdominal pain with positive pregnancy test today. She reports vaginal itching, burning, and discharge x 1 week. She tried Monistat but symptoms did not improve. Her abdominal pain is low in her abdomen and pelvis, intermittent cramping pain that shoots down into her vagina.  She has not taken a pregnancy test at home.  There are no other symptoms. She has not tried other treatments. [name]     HPI  Past Medical History:  Diagnosis Date  . Abnormal MRI, cervical spine   . Anemia   . Elective abortion   . Heart murmur   . Herpes   . Infection    UTI  . Pregnancy induced hypertension    2001  . Vitamin D deficiency    Past Surgical History:  Procedure Laterality Date  . CHOLECYSTECTOMY    . LAPAROSCOPY N/A 06/09/2016   Procedure: LAPAROSCOPY OPERATIVE;  Surgeon: Osborne Oman, MD;  Location: [facility];  Service: Gynecology;  Laterality: N/A;  ectopic   . THERAPEUTIC ABORTION     x3  . UNILATERAL SALPINGECTOMY Left 06/09/2016   Procedure: UNILATERAL SALPINGECTOMY, REMOVAL OF IUD;  Surgeon: Osborne Oman, MD;  Location: [facility];  Service: Gynecology;  Laterality: Left;  . WISDOM TOOTH EXTRACTION     Social History   Socioeconomic History  . Marital status: Single    Spouse name: Not on file  . Number of children: 5  . Years of education: college  . Highest education level: Not on file  Occupational History  . Occupation: Unemployed  Tobacco Use  . Smoking status: Former Smoker    Packs/day: 0.25    Years: 10.00    Pack years: 2.50    Types: Cigarettes    Quit date: 03/2019    Years since quitting: 0.4  . Smokeless tobacco: Never Used  . Tobacco comment: 2-3 cigs/day  Substance and Sexual Activity  . Alcohol use: Not Currently    Comment: social drinker  . Drug use: Yes    Types:  Marijuana    Comment: occasional- last use on new years eve  . Sexual activity: Not Currently  Other Topics Concern  . Not on file  Social History Narrative   Lives at home with children.   Right-handed.   No daily caffeine use.   Social Determinants of Health   Financial Resource Strain:   . Difficulty of Paying Living Expenses: Not on file  Food Insecurity:   . Worried About Charity fundraiser in the Last Year: Not on file  . Ran Out of Food in the Last Year: Not on file  Transportation Needs:   . Lack of Transportation (Medical): Not on file  . Lack of Transportation (Non-Medical): Not on file  Physical Activity:   . Days of Exercise per Week: Not on file  . Minutes of Exercise per Session: Not on file  Stress:   . Feeling of Stress : Not on file  Social Connections:   . Frequency of Communication with Friends and Family: Not on file  . Frequency of Social Gatherings with Friends and Family: Not on file  . Attends Religious Services: Not on file  . Active Member of Clubs or Organizations: Not on file  . Attends Archivist Meetings: Not on file  .  Marital Status: Not on file  Intimate Partner Violence:   . Fear of Current or Ex-Partner: Not on file  . Emotionally Abused: Not on file  . Physically Abused: Not on file  . Sexually Abused: Not on file   No current facility-administered medications on file prior to encounter.   Current Outpatient Medications on File Prior to Encounter  Medication Sig Dispense Refill  . cholecalciferol (VITAMIN D3) 25 MCG (1000 UT) tablet Take 1,000 Units by mouth daily.    [name] gabapentin (NEURONTIN) 300 MG capsule Take 1 capsule (300 mg total) by mouth 3 (three) times daily. 90 capsule 11   No Known Allergies  ROS:  Review of Systems  Constitutional: Negative for chills, fatigue and fever.  Respiratory: Negative for shortness of breath.   Cardiovascular: Negative for chest pain.  Gastrointestinal: Positive for abdominal pain.   Genitourinary: Positive for pelvic pain, vaginal discharge and vaginal pain. Negative for difficulty urinating, dysuria, flank pain and vaginal bleeding.  Neurological: Negative for dizziness and headaches.  Psychiatric/Behavioral: Negative.      I have reviewed patient's Past Medical Hx, Surgical Hx, Family Hx, Social Hx, medications and allergies.   Physical Exam   Patient Vitals for the past 24 hrs:  BP Temp Temp src Pulse Resp SpO2 Height Weight  08/16/19 0617 (!) 148/70 -- -- 79 -- -- -- --  08/16/19 0614 -- 98.1 F (36.7 C) -- -- 16 -- 5\' 6"  (1.676 m) 108 kg  08/16/19 0345 132/60 98.2 F (36.8 C) Oral 73 18 99 % -- --  08/15/19 2325 124/88 98.6 F (37 C) Oral 80 18 97 % -- --   Constitutional: Well-developed, well-nourished female in no acute distress.  Cardiovascular: normal rate Respiratory: normal effort GI: Abd soft, non-tender. Pos BS x 4 MS: Extremities nontender, no edema, normal ROM Neurologic: Alert and oriented x 4.  GU: Neg CVAT.  PELVIC EXAM: Wet prep/GCC collected by blind swab   LAB RESULTS Results for orders placed or performed during the hospital encounter of 08/15/19 (from the past 24 hour(s))  Urinalysis, Routine w reflex microscopic     Status: Abnormal   Collection Time: 08/15/19 11:34 PM  Result Value Ref Range   Color, Urine YELLOW YELLOW   APPearance CLOUDY (A) CLEAR   Specific Gravity, Urine 1.017 1.005 - 1.030   pH 7.0 5.0 - 8.0   Glucose, UA NEGATIVE NEGATIVE mg/dL   Hgb urine dipstick NEGATIVE NEGATIVE   Bilirubin Urine NEGATIVE NEGATIVE   Ketones, ur NEGATIVE NEGATIVE mg/dL   Protein, ur 30 (A) NEGATIVE mg/dL   Nitrite NEGATIVE NEGATIVE   Leukocytes,Ua LARGE (A) NEGATIVE   RBC / HPF 21-50 0 - 5 RBC/hpf   WBC, UA >50 (H) 0 - 5 WBC/hpf   Bacteria, UA RARE (A) NONE SEEN   Squamous Epithelial / LPF 0-5 0 - 5   Mucus PRESENT    Hyaline Casts, UA PRESENT   I-Stat beta hCG blood, ED     Status: Abnormal   Collection Time: 08/16/19  12:24 AM  Result Value Ref Range   I-stat hCG, quantitative >2,000.0 (H) <5 mIU/mL   Comment 3          CBC     Status: Abnormal   Collection Time: 08/16/19  6:08 AM  Result Value Ref Range   WBC 10.0 4.0 - 10.5 K/uL   RBC 4.40 3.87 - 5.11 MIL/uL   Hemoglobin 12.1 12.0 - 15.0 g/dL   HCT 37.3 36.0 - 46.0 %  MCV 84.8 80.0 - 100.0 fL   MCH 27.5 26.0 - 34.0 pg   MCHC 32.4 30.0 - 36.0 g/dL   RDW 16.9 (H) 11.5 - 15.5 %   Platelets 269 150 - 400 K/uL   nRBC 0.0 0.0 - 0.2 %  hCG, quantitative, pregnancy     Status: Abnormal   Collection Time: 08/16/19  6:08 AM  Result Value Ref Range   hCG, Beta Chain, Quant, S 65,451 (H) <5 mIU/mL  Wet prep, genital     Status: Abnormal   Collection Time: 08/16/19  6:38 AM  Result Value Ref Range   Yeast Wet Prep HPF POC NONE SEEN NONE SEEN   Trich, Wet Prep PRESENT (A) NONE SEEN   Clue Cells Wet Prep HPF POC NONE SEEN NONE SEEN   WBC, Wet Prep HPF POC MANY (A) NONE SEEN   Sperm NONE SEEN        IMAGING US OB Comp Less 14 Wks  Result Date: 08/16/2019 CLINICAL DATA:  Pelvic pain EXAM: OBSTETRIC <14 WK [place] AND TRANSVAGINAL OB US TECHNIQUE: Both transabdominal and transvaginal ultrasound examinations were performed for complete evaluation of the gestation as well as the maternal uterus, adnexal regions, and pelvic cul-de-sac. Transvaginal technique was performed to assess early pregnancy. COMPARISON:  None. FINDINGS: Intrauterine gestational sac: Single Yolk sac:  Visualized. Embryo:  Visualized. Cardiac Activity: Visualized. Heart Rate: 121 bpm CRL:  5.8 mm   6 w   2 d                  [place] EDC: 04/08/2020 Subchorionic hemorrhage:  Small subchorionic hemorrhage seen. Maternal uterus/adnexae: Probable corpus luteum cyst in the right ovary. The left ovary is normal in appearance. IMPRESSION: Single live intrauterine pregnancy measuring 6 weeks 2 days. Electronically Signed   By: Prudencio Pair M.D.   On: 08/16/2019 07:00    MAU Management/MDM: Orders Placed  This Encounter  Procedures  . Wet prep, genital  . Culture, OB Urine  . US OB Comp Less 14 Wks  . Urinalysis, Routine w reflex microscopic  . CBC  . hCG, quantitative, pregnancy  . HIV antibody  . Diet NPO time specified  . Pelvic cart to bedside  . I-Stat beta hCG blood, ED  . Discharge patient    Meds ordered this encounter  Medications  . metroNIDAZOLE (FLAGYL) tablet 2,000 mg    [place] consistent with early IUP.  Wet prep positive for trichomonas.  Reviewed both results with pt.  Flagyl 2000 mg x 1 dose given in MAU. Partner therapy for one female partner written.  Pt to start prenatal care as desired, list provided. Pt discharged with strict return precautions.  ASSESSMENT 1. Normal IUP (intrauterine pregnancy) on prenatal ultrasound, first trimester   2. Abdominal pain during pregnancy in first trimester   3. Trichomonal vaginitis during pregnancy in first trimester     PLAN Discharge home Allergies as of 08/16/2019   No Known Allergies     Medication List    TAKE these medications   cholecalciferol 25 MCG (1000 UNIT) tablet Commonly known as: VITAMIN D3 Take 1,000 Units by mouth daily.   gabapentin 300 MG capsule Commonly known as: NEURONTIN Take 1 capsule (300 mg total) by mouth 3 (three) times daily.      Follow-up Information    Prenatal provider of your choice Follow up.   Why: See list provided          Fatima Blank Certified Nurse-Midwife 08/16/2019  7:58 AM

## 2019-08-16 NOTE — MAU Note (Signed)
Very uncomfortable in my bottom area. Some green vag d/c. Had itching and burning in vaginal area last wk. Used monistat last wk and did not help and is getting worse.

## 2019-08-16 NOTE — Discharge Instructions (Signed)
[place] for [employer]                                Phone: [phone] [address], [place], [place], [place] [address]  [facility] [place] [facility] [place]   Phone: [phone] [address], [place], [place] [address]  [facility]                      Phone: [place], [place], [place] [address]  [facility] [place] [facility] [place]  Phone: [place] for [employer] at [employer]  Phone: [place] for [place] [facility]) Phone: [phone]  [facility] [place] at [facility]) Phone: [facility]       Phone: [phone]  [facility]    Phone: [phone]   [facility]    Phone: [phone]  [facility]    Phone: [place]    Phone: [phone]  [place] [facility]       Phone: [phone]   [place] [facility]  Phone: [phone]  [place]    Phone: [phone]  [facility]   Phone: [phone]  [facility]      Phone: [phone]  [facility]    Phone: [phone]   Expedited Partner Therapy:  Information Sheet for Patients and Partners               You have been offered expedited partner therapy (EPT). This information sheet contains important information and warnings you need to be aware of, so please read it carefully.   Expedited Partner Therapy (EPT) is the clinical practice of treating the sexual partners of persons who receive chlamydia, gonorrhea, or trichomoniasis diagnoses by providing medications or prescriptions to the patient. Patients then provide partners with these therapies without the health-care  provider having examined the partner. In other words, EPT is a convenient, fast and private way for patients to help their sexual partners get treated.   Chlamydia and gonorrhea are bacterial infections you get from having sex with a person who is already infected. Trichomoniasis (or "trich") is a very common sexually transmitted infection (STI) that is caused by infection with a protozoan parasite called Trichomonas vaginalis.  Many people with these infections don't know it because they feel fine, but without treatment these infections can cause serious health problems, such as pelvic inflammatory disease, ectopic pregnancy, infertility and increased risk of HIV.   It is important to get treated as soon as possible to protect your health, to avoid spreading these infections to others, and to prevent yourself from becoming re-infected. The good news is these infections can be easily cured with proper antibiotic medicine. The best way to take care of your self is to see a doctor or go to your local health department. If you are not able to see a doctor or other medical provider, you should take EPT.    Recommended Medication: EPT for Chlamydia:  Azithromycin (Zithromax) 1 gram orally in a single dose EPT for Gonorrhea:  Cefixime (Suprax) 400 milligrams orally in a single dose PLUS azithromycin (Zithromax) 1 gram orally in a single dose EPT for Trichomoniasis:  Metronidazole (Flagyl) 2 grams orally in  a single dose   These medicines are very safe. However, you should not take them if you have ever had an allergic reaction (like a rash) to any of these medicines: azithromycin (Zithromax), erythromycin, clarithromycin (Biaxin), metronidazole (Flagyl), tinidazole (Tindimax). If you are uncertain about whether you have an allergy, call your medical provider or pharmacist before taking this medicine. If you have a serious, long-term illness like kidney, liver or heart disease, colitis or stomach problems, or  you are currently taking other prescription medication, talk to your provider before taking this medication.   Women: If you have lower belly pain, pain during sex, vomiting, or a fever, do not take this medicine. Instead, you should see a medical provider to be certain you do not have pelvic inflammatory disease (PID). PID can be serious and lead to infertility, pregnancy problems or chronic pelvic pain.   Pregnant Women: It is very important for you to see a doctor to get pregnancy services and pre-natal care. These antibiotics for EPT are safe for pregnant women, but you still need to see a medical provider as soon as possible. It is also important to note that Doxycycline is an alternative therapy for chlamydia, but it should not be taken by someone who is pregnant.   Men: If you have pain or swelling in the testicles or a fever, do not take this medicine and see a medical provider.     Men who have sex with men (MSM): MSM in [place] continue to experience high rates of syphilis and HIV. Many MSM with gonorrhea or chlamydia could also have syphilis and/or HIV and not know it. If you are a man who has sex with other men, it is very important that you see a medical provider and are tested for HIV and syphilis. EPT is not recommended for gonorrhea for MSM.  Recommended treatment for gonorrhea for MSM is Rocephin (shot) AND azithromycin due to decreased cure rate.  Please see your medical provider if this is the case.    Along with this information sheet is a prescription for the medicine. If you receive a prescription it will be in your name and will indicate your date of birth, or it will be in the name of "Expedited Partner Therapy".   In either case, you can have the prescription filled at a pharmacy. You will be responsible for the cost of the medicine, unless you have prescription drug coverage. In that case, you could provide your name so the pharmacy could bill your health plan.   Take the  medication as directed. Some people will have a mild, upset stomach, which does not last long. AVOID alcohol 24 hours after taking metronidazole (Flagyl) to reduce the possibility of a disulfiram-like reaction (severe vomiting and abdominal pain).  After taking the medicine, do not have sex for 7 days. Do not share this medicine or give it to anyone else. It is important to tell everyone you have had sex with in the last 60 days that they need to go and get tested for sexually transmitted infections.   Ways to prevent these and other sexually transmitted infections (STIs):   [name] Abstain from sex. This is the only sure way to avoid getting an STI.  [name] Use barrier methods, such as condoms, consistently and correctly.  . Limit the number of sexual partners.  . Have regular physical exams, including testing for STIs.   For more information about EPT or other issues pertaining to an STI, please  contact your medical provider or the [facility] Department at [phone] or [email or web address]

## 2019-08-18 LAB — CULTURE, OB URINE: Culture: >=100000 — AB

## 2019-08-19 ENCOUNTER — Other Ambulatory Visit: Payer: Self-pay | Admitting: Advanced Practice Midwife

## 2019-08-19 ENCOUNTER — Encounter: Payer: Self-pay | Admitting: Advanced Practice Midwife

## 2019-08-19 DIAGNOSIS — R8271 Bacteriuria: Secondary | ICD-10-CM | POA: Insufficient documentation

## 2019-08-19 MED ORDER — CEPHALEXIN 500 MG PO CAPS: 500.0000 mg | ORAL_CAPSULE | Freq: Four times a day (QID) | ORAL | 2 refills | Status: DC

## 2019-08-19 NOTE — Progress Notes (Signed)
Rx to pharmacy per susceptibility report on urine culture. Patient notified via phone. NKDA and pharmacy preference confirmed.   Mallie Snooks, MSN, CNM Certified Nurse Midwife, [employer] [facility] for [employer], [place] 08/19/19 10:22 AM

## 2019-08-29 ENCOUNTER — Encounter: Payer: Self-pay | Admitting: *Deleted

## 2019-08-29 ENCOUNTER — Telehealth: Payer: Self-pay | Admitting: *Deleted

## 2019-08-29 NOTE — Telephone Encounter (Signed)
I spoke to the patient who confirmed she is around eight weeks pregnant. She has her first OB-GYN appt on 09/10/2019. States she is undecided if she will keep or terminate this pregnancy. She will keep our office updated. I did inform her that if she chooses to have an elective abortion that she must start birth control due to the side effects of the MS medications that will be prescribed. She verbalized understanding.

## 2019-08-29 NOTE — Telephone Encounter (Signed)
Called pt back and had to leave another message.

## 2019-08-29 NOTE — Telephone Encounter (Signed)
Pt returned call. Please call back when available. 

## 2019-08-29 NOTE — Telephone Encounter (Signed)
Our research department has been in contact with this patient to further discuss her MS treatment. The patient states she is newly pregnant. She had a therapeutic abortion on 05/04/2019. She did not provide her intentions with this pregnancy. She does have a pending appt w/ OB/GYN on 09/10/19. Our research dept requested she call back after she has delivered or is no longer pregnant.  I attempted to reach the patient by phone. No answer. I left a voicemail requesting a call back.

## 2019-09-10 ENCOUNTER — Ambulatory Visit (INDEPENDENT_AMBULATORY_CARE_PROVIDER_SITE_OTHER): Payer: Medicaid Other

## 2019-09-10 DIAGNOSIS — Z3480 Encounter for supervision of other normal pregnancy, unspecified trimester: Secondary | ICD-10-CM

## 2019-09-10 MED ORDER — BLOOD PRESSURE KIT: 1.0000 | PACK | Freq: Once | 0 refills | Status: AC

## 2019-09-10 NOTE — Addendum Note (Signed)
Addended by: Delrae Alfred on: 09/10/2019 02:03 PM   Modules accepted: Orders, Level of Service

## 2019-09-10 NOTE — Progress Notes (Signed)
Virtual Visit via Video Note  I connected with Denise Moses on 09/10/19 at  1:15 PM EST by telephone.  I verified I was speaking with the correct patient by using 2 identifiers.    OB History    Gravida  12   Para  5   Term  4   Preterm  1   AB  6   Living  5     SAB  2   TAB  3   Ectopic  1   Multiple      Live Births  5          History obtained from chart review and the patient.   Assessment   Patient gave the impression that she was not happy about this pregnancy. She requested to speak with a therapist. Referral placed.  Plan   Referral placed for counseling    Delrae Alfred, CMA

## 2019-09-17 ENCOUNTER — Encounter: Payer: Self-pay | Admitting: Obstetrics and Gynecology

## 2019-09-17 ENCOUNTER — Ambulatory Visit (INDEPENDENT_AMBULATORY_CARE_PROVIDER_SITE_OTHER): Payer: Medicaid Other | Admitting: Licensed Clinical Social Worker

## 2019-09-17 ENCOUNTER — Ambulatory Visit (INDEPENDENT_AMBULATORY_CARE_PROVIDER_SITE_OTHER): Payer: Medicaid Other | Admitting: Obstetrics and Gynecology

## 2019-09-17 ENCOUNTER — Other Ambulatory Visit (HOSPITAL_COMMUNITY)
Admission: RE | Admit: 2019-09-17 | Discharge: 2019-09-17 | Disposition: A | Discharge status: Home / Self Care | Payer: Medicaid Other | Source: Ambulatory Visit | Attending: Obstetrics and Gynecology | Admitting: Obstetrics and Gynecology

## 2019-09-17 ENCOUNTER — Other Ambulatory Visit: Payer: Self-pay

## 2019-09-17 VITALS — BP 147/83 | HR 76 | Wt 239.0 lb

## 2019-09-17 DIAGNOSIS — Z3A10 10 weeks gestation of pregnancy: Secondary | ICD-10-CM

## 2019-09-17 DIAGNOSIS — O99210 Obesity complicating pregnancy, unspecified trimester: Secondary | ICD-10-CM | POA: Diagnosis not present

## 2019-09-17 DIAGNOSIS — Z3480 Encounter for supervision of other normal pregnancy, unspecified trimester: Secondary | ICD-10-CM | POA: Diagnosis not present

## 2019-09-17 DIAGNOSIS — O10911 Unspecified pre-existing hypertension complicating pregnancy, first trimester: Secondary | ICD-10-CM

## 2019-09-17 DIAGNOSIS — F4321 Adjustment disorder with depressed mood: Secondary | ICD-10-CM | POA: Diagnosis not present

## 2019-09-17 DIAGNOSIS — O10919 Unspecified pre-existing hypertension complicating pregnancy, unspecified trimester: Secondary | ICD-10-CM | POA: Insufficient documentation

## 2019-09-17 DIAGNOSIS — O09521 Supervision of elderly multigravida, first trimester: Secondary | ICD-10-CM

## 2019-09-17 DIAGNOSIS — O99211 Obesity complicating pregnancy, first trimester: Secondary | ICD-10-CM

## 2019-09-17 DIAGNOSIS — O09529 Supervision of elderly multigravida, unspecified trimester: Secondary | ICD-10-CM | POA: Insufficient documentation

## 2019-09-17 DIAGNOSIS — R8271 Bacteriuria: Secondary | ICD-10-CM

## 2019-09-17 MED ORDER — ASPIRIN EC 81 MG PO TBEC: 81.0000 mg | DELAYED_RELEASE_TABLET | Freq: Every day | ORAL | 2 refills | Status: DC

## 2019-09-17 MED ORDER — BLOOD PRESSURE MONITOR KIT: 1.0000 | PACK | Freq: Once | 0 refills | Status: AC

## 2019-09-17 MED ORDER — PRENATE PIXIE 10-0.6-0.4-200 MG PO CAPS: 1.0000 | ORAL_CAPSULE | Freq: Every day | ORAL | 9 refills | Status: DC

## 2019-09-17 NOTE — Patient Instructions (Signed)
 Second Trimester of Pregnancy The second trimester is from week 14 through week 27 (months 4 through 6). The second trimester is often a time when you feel your best. Your body has adjusted to being pregnant, and you begin to feel better physically. Usually, morning sickness has lessened or quit completely, you may have more energy, and you may have an increase in appetite. The second trimester is also a time when the fetus is growing rapidly. At the end of the sixth month, the fetus is about 9 inches long and weighs about 1 pounds. You will likely begin to feel the baby move (quickening) between 16 and 20 weeks of pregnancy. Body changes during your second trimester Your body continues to go through many changes during your second trimester. The changes vary from woman to woman.  Your weight will continue to increase. You will notice your lower abdomen bulging out.  You may begin to get stretch marks on your hips, abdomen, and breasts.  You may develop headaches that can be relieved by medicines. The medicines should be approved by your health care provider.  You may urinate more often because the fetus is pressing on your bladder.  You may develop or continue to have heartburn as a result of your pregnancy.  You may develop constipation because certain hormones are causing the muscles that push waste through your intestines to slow down.  You may develop hemorrhoids or swollen, bulging veins (varicose veins).  You may have back pain. This is caused by: ? Weight gain. ? Pregnancy hormones that are relaxing the joints in your pelvis. ? A shift in weight and the muscles that support your balance.  Your breasts will continue to grow and they will continue to become tender.  Your gums may bleed and may be sensitive to brushing and flossing.  Dark spots or blotches (chloasma, mask of pregnancy) may develop on your face. This will likely fade after the baby is born.  A dark line from  your belly button to the pubic area (linea nigra) may appear. This will likely fade after the baby is born.  You may have changes in your hair. These can include thickening of your hair, rapid growth, and changes in texture. Some women also have hair loss during or after pregnancy, or hair that feels dry or thin. Your hair will most likely return to normal after your baby is born. What to expect at prenatal visits During a routine prenatal visit:  You will be weighed to make sure you and the fetus are growing normally.  Your blood pressure will be taken.  Your abdomen will be measured to track your baby's growth.  The fetal heartbeat will be listened to.  Any test results from the previous visit will be discussed. Your health care provider may ask you:  How you are feeling.  If you are feeling the baby move.  If you have had any abnormal symptoms, such as leaking fluid, bleeding, severe headaches, or abdominal cramping.  If you are using any tobacco products, including cigarettes, chewing tobacco, and electronic cigarettes.  If you have any questions. Other tests that may be performed during your second trimester include:  Blood tests that check for: ? Low iron levels (anemia). ? High blood sugar that affects pregnant women (gestational diabetes) between 24 and 28 weeks. ? Rh antibodies. This is to check for a protein on red blood cells (Rh factor).  Urine tests to check for infections, diabetes, or protein in   the urine.  An ultrasound to confirm the proper growth and development of the baby.  An amniocentesis to check for possible genetic problems.  Fetal screens for spina bifida and Down syndrome.  HIV (human immunodeficiency virus) testing. Routine prenatal testing includes screening for HIV, unless you choose not to have this test. Follow these instructions at home: Medicines  Follow your health care provider's instructions regarding medicine use. Specific medicines  may be either safe or unsafe to take during pregnancy.  Take a prenatal vitamin that contains at least 600 micrograms (mcg) of folic acid.  If you develop constipation, try taking a stool softener if your health care provider approves. Eating and drinking   Eat a balanced diet that includes fresh fruits and vegetables, whole grains, good sources of protein such as meat, eggs, or tofu, and low-fat dairy. Your health care provider will help you determine the amount of weight gain that is right for you.  Avoid raw meat and uncooked cheese. These carry germs that can cause birth defects in the baby.  If you have low calcium intake from food, talk to your health care provider about whether you should take a daily calcium supplement.  Limit foods that are high in fat and processed sugars, such as fried and sweet foods.  To prevent constipation: ? Drink enough fluid to keep your urine clear or pale yellow. ? Eat foods that are high in fiber, such as fresh fruits and vegetables, whole grains, and beans. Activity  Exercise only as directed by your health care provider. Most women can continue their usual exercise routine during pregnancy. Try to exercise for 30 minutes at least 5 days a week. Stop exercising if you experience uterine contractions.  Avoid heavy lifting, wear low heel shoes, and practice good posture.  A sexual relationship may be continued unless your health care provider directs you otherwise. Relieving pain and discomfort  Wear a good support bra to prevent discomfort from breast tenderness.  Take warm sitz baths to soothe any pain or discomfort caused by hemorrhoids. Use hemorrhoid cream if your health care provider approves.  Rest with your legs elevated if you have leg cramps or low back pain.  If you develop varicose veins, wear support hose. Elevate your feet for 15 minutes, 3-4 times a day. Limit salt in your diet. Prenatal Care  Write down your questions. Take  them to your prenatal visits.  Keep all your prenatal visits as told by your health care provider. This is important. Safety  Wear your seat belt at all times when driving.  Make a list of emergency phone numbers, including numbers for family, friends, the hospital, and police and fire departments. General instructions  Ask your health care provider for a referral to a local prenatal education class. Begin classes no later than the beginning of month 6 of your pregnancy.  Ask for help if you have counseling or nutritional needs during pregnancy. Your health care provider can offer advice or refer you to specialists for help with various needs.  Do not use hot tubs, steam rooms, or saunas.  Do not douche or use tampons or scented sanitary pads.  Do not cross your legs for long periods of time.  Avoid cat litter boxes and soil used by cats. These carry germs that can cause birth defects in the baby and possibly loss of the fetus by miscarriage or stillbirth.  Avoid all smoking, herbs, alcohol, and unprescribed drugs. Chemicals in these products can affect the   formation and growth of the baby.  Do not use any products that contain nicotine or tobacco, such as cigarettes and e-cigarettes. If you need help quitting, ask your health care provider.  Visit your dentist if you have not gone yet during your pregnancy. Use a soft toothbrush to brush your teeth and be gentle when you floss. Contact a health care provider if:  You have dizziness.  You have mild pelvic cramps, pelvic pressure, or nagging pain in the abdominal area.  You have persistent nausea, vomiting, or diarrhea.  You have a bad smelling vaginal discharge.  You have pain when you urinate. Get help right away if:  You have a fever.  You are leaking fluid from your vagina.  You have spotting or bleeding from your vagina.  You have severe abdominal cramping or pain.  You have rapid weight gain or weight loss.  You  have shortness of breath with chest pain.  You notice sudden or extreme swelling of your face, hands, ankles, feet, or legs.  You have not felt your baby move in over an hour.  You have severe headaches that do not go away when you take medicine.  You have vision changes. Summary  The second trimester is from week 14 through week 27 (months 4 through 6). It is also a time when the fetus is growing rapidly.  Your body goes through many changes during pregnancy. The changes vary from woman to woman.  Avoid all smoking, herbs, alcohol, and unprescribed drugs. These chemicals affect the formation and growth your baby.  Do not use any tobacco products, such as cigarettes, chewing tobacco, and e-cigarettes. If you need help quitting, ask your health care provider.  Contact your health care provider if you have any questions. Keep all prenatal visits as told by your health care provider. This is important. This information is not intended to replace advice given to you by your health care provider. Make sure you discuss any questions you have with your health care provider. Document Revised: 11/10/2018 Document Reviewed: 08/24/2016 Else[employer] Patient Education  2020 [employer]   Contraception Choices Contraception, also called birth control, refers to methods or devices that prevent pregnancy. Hormonal methods Contraceptive implant  A contraceptive implant is a thin, plastic tube that contains a hormone. It is inserted into the upper part of the arm. It can remain in place for up to 3 years. Progestin-only injections Progestin-only injections are injections of progestin, a synthetic form of the hormone progesterone. They are given every 3 months by a health care provider. Birth control pills  Birth control pills are pills that contain hormones that prevent pregnancy. They must be taken once a day, preferably at the same time each day. Birth control patch  The birth control patch  contains hormones that prevent pregnancy. It is placed on the skin and must be changed once a week for three weeks and removed on the fourth week. A prescription is needed to use this method of contraception. Vaginal ring  A vaginal ring contains hormones that prevent pregnancy. It is placed in the vagina for three weeks and removed on the fourth week. After that, the process is repeated with a new ring. A prescription is needed to use this method of contraception. Emergency contraceptive Emergency contraceptives prevent pregnancy after unprotected sex. They come in pill form and can be taken up to 5 days after sex. They work best the sooner they are taken after having sex. Most emergency contraceptives are available   without a prescription. This method should not be used as your only form of birth control. Barrier methods Female condom  A female condom is a thin sheath that is worn over the penis during sex. Condoms keep sperm from going inside a woman's body. They can be used with a spermicide to increase their effectiveness. They should be disposed after a single use. Female condom  A female condom is a soft, loose-fitting sheath that is put into the vagina before sex. The condom keeps sperm from going inside a woman's body. They should be disposed after a single use. Diaphragm  A diaphragm is a soft, dome-shaped barrier. It is inserted into the vagina before sex, along with a spermicide. The diaphragm blocks sperm from entering the uterus, and the spermicide kills sperm. A diaphragm should be left in the vagina for 6-8 hours after sex and removed within 24 hours. A diaphragm is prescribed and fitted by a health care provider. A diaphragm should be replaced every 1-2 years, after giving birth, after gaining more than 15 lb (6.8 kg), and after pelvic surgery. Cervical cap  A cervical cap is a round, soft latex or plastic cup that fits over the cervix. It is inserted into the vagina before sex, along  with spermicide. It blocks sperm from entering the uterus. The cap should be left in place for 6-8 hours after sex and removed within 48 hours. A cervical cap must be prescribed and fitted by a health care provider. It should be replaced every 2 years. Sponge  A sponge is a soft, circular piece of polyurethane foam with spermicide on it. The sponge helps block sperm from entering the uterus, and the spermicide kills sperm. To use it, you make it wet and then insert it into the vagina. It should be inserted before sex, left in for at least 6 hours after sex, and removed and thrown away within 30 hours. Spermicides Spermicides are chemicals that kill or block sperm from entering the cervix and uterus. They can come as a cream, jelly, suppository, foam, or tablet. A spermicide should be inserted into the vagina with an applicator at least 10-15 minutes before sex to allow time for it to work. The process must be repeated every time you have sex. Spermicides do not require a prescription. Intrauterine contraception Intrauterine device (IUD) An IUD is a T-shaped device that is put in a woman's uterus. There are two types:  Hormone IUD.This type contains progestin, a synthetic form of the hormone progesterone. This type can stay in place for 3-5 years.  Copper IUD.This type is wrapped in copper wire. It can stay in place for 10 years.  Permanent methods of contraception Female tubal ligation In this method, a woman's fallopian tubes are sealed, tied, or blocked during surgery to prevent eggs from traveling to the uterus. Hysteroscopic sterilization In this method, a small, flexible insert is placed into each fallopian tube. The inserts cause scar tissue to form in the fallopian tubes and block them, so sperm cannot reach an egg. The procedure takes about 3 months to be effective. Another form of birth control must be used during those 3 months. Female sterilization This is a procedure to tie off the  tubes that carry sperm (vasectomy). After the procedure, the man can still ejaculate fluid (semen). Natural planning methods Natural family planning In this method, a couple does not have sex on days when the woman could become pregnant. Calendar method This means keeping track of the length   of each menstrual cycle, identifying the days when pregnancy can happen, and not having sex on those days. Ovulation method In this method, a couple avoids sex during ovulation. Symptothermal method This method involves not having sex during ovulation. The woman typically checks for ovulation by watching changes in her temperature and in the consistency of cervical mucus. Post-ovulation method In this method, a couple waits to have sex until after ovulation. Summary  Contraception, also called birth control, means methods or devices that prevent pregnancy.  Hormonal methods of contraception include implants, injections, pills, patches, vaginal rings, and emergency contraceptives.  Barrier methods of contraception can include female condoms, female condoms, diaphragms, cervical caps, sponges, and spermicides.  There are two types of IUDs (intrauterine devices). An IUD can be put in a woman's uterus to prevent pregnancy for 3-5 years.  Permanent sterilization can be done through a procedure for males, females, or both.  Natural family planning methods involve not having sex on days when the woman could become pregnant. This information is not intended to replace advice given to you by your health care provider. Make sure you discuss any questions you have with your health care provider. Document Revised: 07/21/2017 Document Reviewed: 08/21/2016 Else[employer] Patient Education  2020 [employer]   Breastfeeding  Choosing to breastfeed is one of the best decisions you can make for yourself and your baby. A change in hormones during pregnancy causes your breasts to make breast milk in your milk-producing  glands. Hormones prevent breast milk from being released before your baby is born. They also prompt milk flow after birth. Once breastfeeding has begun, thoughts of your baby, as well as his or her sucking or crying, can stimulate the release of milk from your milk-producing glands. Benefits of breastfeeding Research shows that breastfeeding offers many health benefits for infants and mothers. It also offers a cost-free and convenient way to feed your baby. For your baby  Your first milk (colostrum) helps your baby's digestive system to function better.  Special cells in your milk (antibodies) help your baby to fight off infections.  Breastfed babies are less likely to develop asthma, allergies, obesity, or type 2 diabetes. They are also at lower risk for sudden infant death syndrome (SIDS).  Nutrients in breast milk are better able to meet your baby's needs compared to infant formula.  Breast milk improves your baby's brain development. For you  Breastfeeding helps to create a very special bond between you and your baby.  Breastfeeding is convenient. Breast milk costs nothing and is always available at the correct temperature.  Breastfeeding helps to burn calories. It helps you to lose the weight that you gained during pregnancy.  Breastfeeding makes your uterus return faster to its size before pregnancy. It also slows bleeding (lochia) after you give birth.  Breastfeeding helps to lower your risk of developing type 2 diabetes, osteoporosis, rheumatoid arthritis, cardiovascular disease, and breast, ovarian, uterine, and endometrial cancer later in life. Breastfeeding basics Starting breastfeeding  Find a comfortable place to sit or lie down, with your neck and back well-supported.  Place a pillow or a rolled-up blanket under your baby to bring him or her to the level of your breast (if you are seated). Nursing pillows are specially designed to help support your arms and your baby while  you breastfeed.  Make sure that your baby's tummy (abdomen) is facing your abdomen.  Gently massage your breast. With your fingertips, massage from the outer edges of your breast inward toward   the nipple. This encourages milk flow. If your milk flows slowly, you may need to continue this action during the feeding.  Support your breast with 4 fingers underneath and your thumb above your nipple (make the letter "C" with your hand). Make sure your fingers are well away from your nipple and your baby's mouth.  Stroke your baby's lips gently with your finger or nipple.  When your baby's mouth is open wide enough, quickly bring your baby to your breast, placing your entire nipple and as much of the areola as possible into your baby's mouth. The areola is the colored area around your nipple. ? More areola should be visible above your baby's upper lip than below the lower lip. ? Your baby's lips should be opened and extended outward (flanged) to ensure an adequate, comfortable latch. ? Your baby's tongue should be between his or her lower gum and your breast.  Make sure that your baby's mouth is correctly positioned around your nipple (latched). Your baby's lips should create a seal on your breast and be turned out (everted).  It is common for your baby to suck about 2-3 minutes in order to start the flow of breast milk. Latching Teaching your baby how to latch onto your breast properly is very important. An improper latch can cause nipple pain, decreased milk supply, and poor weight gain in your baby. Also, if your baby is not latched onto your nipple properly, he or she may swallow some air during feeding. This can make your baby fussy. Burping your baby when you switch breasts during the feeding can help to get rid of the air. However, teaching your baby to latch on properly is still the best way to prevent fussiness from swallowing air while breastfeeding. Signs that your baby has successfully  latched onto your nipple  Silent tugging or silent sucking, without causing you pain. Infant's lips should be extended outward (flanged).  Swallowing heard between every 3-4 sucks once your milk has started to flow (after your let-down milk reflex occurs).  Muscle movement above and in front of his or her ears while sucking. Signs that your baby has not successfully latched onto your nipple  Sucking sounds or smacking sounds from your baby while breastfeeding.  Nipple pain. If you think your baby has not latched on correctly, slip your finger into the corner of your baby's mouth to break the suction and place it between your baby's gums. Attempt to start breastfeeding again. Signs of successful breastfeeding Signs from your baby  Your baby will gradually decrease the number of sucks or will completely stop sucking.  Your baby will fall asleep.  Your baby's body will relax.  Your baby will retain a small amount of milk in his or her mouth.  Your baby will let go of your breast by himself or herself. Signs from you  Breasts that have increased in firmness, weight, and size 1-3 hours after feeding.  Breasts that are softer immediately after breastfeeding.  Increased milk volume, as well as a change in milk consistency and color by the fifth day of breastfeeding.  Nipples that are not sore, cracked, or bleeding. Signs that your baby is getting enough milk  Wetting at least 1-2 diapers during the first 24 hours after birth.  Wetting at least 5-6 diapers every 24 hours for the first week after birth. The urine should be clear or pale yellow by the age of 5 days.  Wetting 6-8 diapers every 24 hours as   your baby continues to grow and develop.  At least 3 stools in a 24-hour period by the age of 5 days. The stool should be soft and yellow.  At least 3 stools in a 24-hour period by the age of 7 days. The stool should be seedy and yellow.  No loss of weight greater than 10% of  birth weight during the first 3 days of life.  Average weight gain of 4-7 oz (113-198 g) per week after the age of 4 days.  Consistent daily weight gain by the age of 5 days, without weight loss after the age of 2 weeks. After a feeding, your baby may spit up a small amount of milk. This is normal. Breastfeeding frequency and duration Frequent feeding will help you make more milk and can prevent sore nipples and extremely full breasts (breast engorgement). Breastfeed when you feel the need to reduce the fullness of your breasts or when your baby shows signs of hunger. This is called "breastfeeding on demand." Signs that your baby is hungry include:  Increased alertness, activity, or restlessness.  Movement of the head from side to side.  Opening of the mouth when the corner of the mouth or cheek is stroked (rooting).  Increased sucking sounds, smacking lips, cooing, sighing, or squeaking.  Hand-to-mouth movements and sucking on fingers or hands.  Fussing or crying. Avoid introducing a pacifier to your baby in the first 4-6 weeks after your baby is born. After this time, you may choose to use a pacifier. Research has shown that pacifier use during the first year of a baby's life decreases the risk of sudden infant death syndrome (SIDS). Allow your baby to feed on each breast as long as he or she wants. When your baby unlatches or falls asleep while feeding from the first breast, offer the second breast. Because newborns are often sleepy in the first few weeks of life, you may need to awaken your baby to get him or her to feed. Breastfeeding times will vary from baby to baby. However, the following rules can serve as a guide to help you make sure that your baby is properly fed:  Newborns (babies 4 weeks of age or younger) may breastfeed every 1-3 hours.  Newborns should not go without breastfeeding for longer than 3 hours during the day or 5 hours during the night.  You should breastfeed  your baby a minimum of 8 times in a 24-hour period. Breast milk pumping     Pumping and storing breast milk allows you to make sure that your baby is exclusively fed your breast milk, even at times when you are unable to breastfeed. This is especially important if you go back to work while you are still breastfeeding, or if you are not able to be present during feedings. Your lactation consultant can help you find a method of pumping that works best for you and give you guidelines about how long it is safe to store breast milk. Caring for your breasts while you breastfeed Nipples can become dry, cracked, and sore while breastfeeding. The following recommendations can help keep your breasts moisturized and healthy:  Avoid using soap on your nipples.  Wear a supportive bra designed especially for nursing. Avoid wearing underwire-style bras or extremely tight bras (sports bras).  Air-dry your nipples for 3-4 minutes after each feeding.  Use only cotton bra pads to absorb leaked breast milk. Leaking of breast milk between feedings is normal.  Use lanolin on your nipples   after breastfeeding. Lanolin helps to maintain your skin's normal moisture barrier. Pure lanolin is not harmful (not toxic) to your baby. You may also hand express a few drops of breast milk and gently massage that milk into your nipples and allow the milk to air-dry. In the first few weeks after giving birth, some women experience breast engorgement. Engorgement can make your breasts feel heavy, warm, and tender to the touch. Engorgement peaks within 3-5 days after you give birth. The following recommendations can help to ease engorgement:  Completely empty your breasts while breastfeeding or pumping. You may want to start by applying warm, moist heat (in the shower or with warm, water-soaked hand towels) just before feeding or pumping. This increases circulation and helps the milk flow. If your baby does not completely empty your  breasts while breastfeeding, pump any extra milk after he or she is finished.  Apply ice packs to your breasts immediately after breastfeeding or pumping, unless this is too uncomfortable for you. To do this: ? Put ice in a plastic bag. ? Place a towel between your skin and the bag. ? Leave the ice on for 20 minutes, 2-3 times a day.  Make sure that your baby is latched on and positioned properly while breastfeeding. If engorgement persists after 48 hours of following these recommendations, contact your health care provider or a lactation consultant. Overall health care recommendations while breastfeeding  Eat 3 healthy meals and 3 snacks every day. Well-nourished mothers who are breastfeeding need an additional 450-500 calories a day. You can meet this requirement by increasing the amount of a balanced diet that you eat.  Drink enough water to keep your urine pale yellow or clear.  Rest often, relax, and continue to take your prenatal vitamins to prevent fatigue, stress, and low vitamin and mineral levels in your body (nutrient deficiencies).  Do not use any products that contain nicotine or tobacco, such as cigarettes and e-cigarettes. Your baby may be harmed by chemicals from cigarettes that pass into breast milk and exposure to secondhand smoke. If you need help quitting, ask your health care provider.  Avoid alcohol.  Do not use illegal drugs or marijuana.  Talk with your health care provider before taking any medicines. These include over-the-counter and prescription medicines as well as vitamins and herbal supplements. Some medicines that may be harmful to your baby can pass through breast milk.  It is possible to become pregnant while breastfeeding. If birth control is desired, ask your health care provider about options that will be safe while breastfeeding your baby. Where to find more information: [employer]: www.llli.org Contact a health care provider  if:  You feel like you want to stop breastfeeding or have become frustrated with breastfeeding.  Your nipples are cracked or bleeding.  Your breasts are red, tender, or warm.  You have: ? Painful breasts or nipples. ? A swollen area on either breast. ? A fever or chills. ? Nausea or vomiting. ? Drainage other than breast milk from your nipples.  Your breasts do not become full before feedings by the fifth day after you give birth.  You feel sad and depressed.  Your baby is: ? Too sleepy to eat well. ? Having trouble sleeping. ? More than 1 week old and wetting fewer than 6 diapers in a 24-hour period. ? Not gaining weight by 5 days of age.  Your baby has fewer than 3 stools in a 24-hour period.  Your baby's skin or   the white parts of his or her eyes become yellow. Get help right away if:  Your baby is overly tired (lethargic) and does not want to wake up and feed.  Your baby develops an unexplained fever. Summary  Breastfeeding offers many health benefits for infant and mothers.  Try to breastfeed your infant when he or she shows early signs of hunger.  Gently tickle or stroke your baby's lips with your finger or nipple to allow the baby to open his or her mouth. Bring the baby to your breast. Make sure that much of the areola is in your baby's mouth. Offer one side and burp the baby before you offer the other side.  Talk with your health care provider or lactation consultant if you have questions or you face problems as you breastfeed. This information is not intended to replace advice given to you by your health care provider. Make sure you discuss any questions you have with your health care provider. Document Revised: 10/13/2017 Document Reviewed: 08/20/2016 Elsevier Patient Education  2020 Elsevier Inc.  

## 2019-09-17 NOTE — Progress Notes (Signed)
Subjective:    Denise Moses is a [id number] [redacted]w[redacted]d being seen today for her first obstetrical visit.  Her obstetrical history is significant for advanced maternal age, obesity and CHTN (not on any medication). Patient does intend to breast feed. Pregnancy history fully reviewed.  Patient reports nausea.  Vitals:   09/17/19 1323 09/17/19 1330  BP: (!) 152/80 (!) 147/83  Pulse: 79 76  Weight: 239 lb (108.4 kg)     HISTORY: OB History  Gravida Para Term Preterm AB Living  12 5 4 1 6 5   SAB TAB Ectopic Multiple Live Births  2 3 1   5     # Outcome Date GA Lbr Len/2nd Weight Sex Delivery Anes PTL Lv  12 Current           11 SAB 07/03/14 [redacted]w[redacted]d   U SAB   FD  10 Term 12/22/06 [redacted]w[redacted]d  6 lb 11 oz (3.033 kg) F Vag-Spont EPI  LIV  9 Term 10/06/05 [redacted]w[redacted]d  8 lb 8 oz (3.856 kg) F Vag-Spont EPI  LIV  8 Preterm 12/17/99 [redacted]w[redacted]d  5 lb 11 oz (2.58 kg) M Vag-Spont None Y LIV  7 Term 11/03/98 [redacted]w[redacted]d  7 lb 14 oz (3.572 kg) M Vag-Spont EPI  LIV  6 TAB           5 TAB           4 TAB           3 Ectopic           2 SAB           1 Term  [redacted]w[redacted]d   M    LIV     Birth Comments: System Generated. Please review and update pregnancy details.   Past Medical History:  Diagnosis Date  . Abnormal MRI, cervical spine   . Anemia   . Elective abortion   . Heart murmur   . Herpes   . Infection    UTI  . Pregnancy induced hypertension    2001  . Vitamin D deficiency    Past Surgical History:  Procedure Laterality Date  . CHOLECYSTECTOMY    . LAPAROSCOPY N/A 06/09/2016   Procedure: LAPAROSCOPY OPERATIVE;  Surgeon: Osborne Oman, MD;  Location: [facility];  Service: Gynecology;  Laterality: N/A;  ectopic   . THERAPEUTIC ABORTION     x 4  . UNILATERAL SALPINGECTOMY Left 06/09/2016   Procedure: UNILATERAL SALPINGECTOMY, REMOVAL OF IUD;  Surgeon: Osborne Oman, MD;  Location: [facility];  Service: Gynecology;  Laterality: Left;  . WISDOM TOOTH EXTRACTION     Family History  Problem Relation Age of Onset   . Breast cancer Mother 71       was a smoker, alcohol drinker in early life   . Breast cancer Maternal Aunt   . Cancer Maternal Grandfather        unsure of type  . Other Father        unsure of history  . Breast cancer Maternal Aunt      Exam    Uterus:   12-weeks size  Pelvic Exam:    Perineum: Normal Perineum   Vulva: normal   Vagina:  normal mucosa, normal discharge   pH:    Cervix: multiparous appearance and cervix is closed and long   Adnexa: not evaluated   Bony Pelvis: gynecoid  System: Breast:  normal appearance, no masses or tenderness   Skin: normal coloration and turgor, no rashes  Neurologic: oriented, no focal deficits   Extremities: normal strength, tone, and muscle mass   HEENT extra ocular movement intact   Mouth/Teeth mucous membranes moist, pharynx normal without lesions and dental hygiene good   Neck supple and no masses   Cardiovascular: regular rate and rhythm   Respiratory:  appears well, vitals normal, no respiratory distress, acyanotic, normal RR, chest clear, no wheezing, crepitations, rhonchi, normal symmetric air entry   Abdomen: soft, non-tender; bowel sounds normal; no masses,  no organomegaly   Urinary:       Assessment:    Pregnancy: [id number] Patient Active Problem List   Diagnosis Date Noted  . AMA (advanced maternal age) multigravida 35+ 09/17/2019  . Maternal obesity affecting pregnancy, antepartum 09/17/2019  . Supervision of other normal pregnancy, antepartum 09/10/2019  . GBS bacteriuria 08/19/2019  . Multiple sclerosis ([place]) 04/30/2019  . Cervical myelopathy ([place]) 04/12/2019  . Paresthesia 04/12/2019  . Vitamin D deficiency 07/05/2017  . Iron deficiency anemia 07/05/2017  . Ruptured left tubal ectopic pregnancy causing hemoperitoneum 06/09/2016  . 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 Recurrent boils 04/13/2016  . Family history of breast cancer in first degree relative 04/13/2016  . Smoker 07/03/2014         Plan:     Initial labs drawn. Prenatal vitamins. Problem list reviewed and updated. Genetic Screening discussed : panorama ordered .  Ultrasound discussed; fetal survey: ordered. Patient with elevated BP today and on previous ED visits. Patient informed of diagnosis of CHTN. Will monitor closely for now. Baseline labs ordered Rx ASA provided to start next week  Patient considering BTL vs IUD  Follow up in 4 weeks. 50% of 30 min visit spent on counseling and coordination of care.     Alexx Mcburney 09/17/2019

## 2019-09-17 NOTE — Addendum Note (Signed)
Addended by: Lewie Loron D on: 09/17/2019 02:19 PM   Modules accepted: Orders

## 2019-09-17 NOTE — Progress Notes (Signed)
Pt states she thinks she had pap within last 2 years.   Pt states she is nauseated daily, no vomiting.

## 2019-09-17 NOTE — Addendum Note (Signed)
Addended by: Lewie Loron D on: 09/17/2019 03:11 PM   Modules accepted: Orders

## 2019-09-18 ENCOUNTER — Other Ambulatory Visit: Payer: Self-pay | Admitting: Obstetrics and Gynecology

## 2019-09-18 LAB — OBSTETRIC PANEL, INCLUDING HIV
Antibody Screen: NEGATIVE
Basophils Absolute: 0.1 10*3/uL (ref 0.0–0.2)
Basos: 1 %
EOS (ABSOLUTE): 0.2 10*3/uL (ref 0.0–0.4)
Eos: 2 %
HIV Screen 4th Generation wRfx: NONREACTIVE
Hematocrit: 33.9 % — ABNORMAL LOW (ref 34.0–46.6)
Hemoglobin: 11.5 g/dL (ref 11.1–15.9)
Hepatitis B Surface Ag: NEGATIVE
Immature Grans (Abs): 0 10*3/uL (ref 0.0–0.1)
Immature Granulocytes: 0 %
Lymphocytes Absolute: 2.4 10*3/uL (ref 0.7–3.1)
Lymphs: 23 %
MCH: 28.8 pg (ref 26.6–33.0)
MCHC: 33.9 g/dL (ref 31.5–35.7)
MCV: 85 fL (ref 79–97)
Monocytes Absolute: 0.6 10*3/uL (ref 0.1–0.9)
Monocytes: 6 %
Neutrophils Absolute: 7.3 10*3/uL — ABNORMAL HIGH (ref 1.4–7.0)
Neutrophils: 68 %
Platelets: 287 10*3/uL (ref 150–450)
RBC: 4 x10E6/uL (ref 3.77–5.28)
RDW: 15.9 % — ABNORMAL HIGH (ref 11.7–15.4)
RPR Ser Ql: NONREACTIVE
Rh Factor: POSITIVE
Rubella Antibodies, IGG: 1.76 index (ref >0.99)
WBC: 10.7 10*3/uL (ref 3.4–10.8)

## 2019-09-18 LAB — COMPREHENSIVE METABOLIC PANEL
ALT: 17 IU/L (ref 0–32)
AST: 13 IU/L (ref 0–40)
Albumin/Globulin Ratio: 1.3 (ref 1.2–2.2)
Albumin: 3.9 g/dL (ref 3.8–4.8)
Alkaline Phosphatase: 73 IU/L (ref 39–117)
BUN/Creatinine Ratio: 8 — ABNORMAL LOW (ref 9–23)
BUN: 5 mg/dL — ABNORMAL LOW (ref 6–20)
Bilirubin Total: 0.2 mg/dL (ref 0.0–1.2)
CO2: 18 mmol/L — ABNORMAL LOW (ref 20–29)
Calcium: 9.8 mg/dL (ref 8.7–10.2)
Chloride: 102 mmol/L (ref 96–106)
Creatinine, Ser: 0.61 mg/dL (ref 0.57–1.00)
GFR calc Af Amer: 132 mL/min/{1.73_m2} (ref >59)
GFR calc non Af Amer: 115 mL/min/{1.73_m2} (ref >59)
Globulin, Total: 3 g/dL (ref 1.5–4.5)
Glucose: 89 mg/dL (ref 65–99)
Potassium: 3.5 mmol/L (ref 3.5–5.2)
Sodium: 136 mmol/L (ref 134–144)
Total Protein: 6.9 g/dL (ref 6.0–8.5)

## 2019-09-18 LAB — PROTEIN / CREATININE RATIO, URINE
Creatinine, Urine: 223.8 mg/dL
Protein, Ur: 32.2 mg/dL
Protein/Creat Ratio: 144 mg/g creat (ref 0–200)

## 2019-09-18 LAB — HEMOGLOBIN A1C
Est. average glucose Bld gHb Est-mCnc: 103 mg/dL
Hgb A1c MFr Bld: 5.2 % (ref 4.8–5.6)

## 2019-09-18 MED ORDER — PROMETHAZINE HCL 25 MG PO TABS: 25.0000 mg | ORAL_TABLET | Freq: Four times a day (QID) | ORAL | 2 refills | Status: DC | PRN

## 2019-09-18 MED ORDER — DOXYLAMINE-PYRIDOXINE 10-10 MG PO TBEC: 2.0000 | DELAYED_RELEASE_TABLET | Freq: Every day | ORAL | 5 refills | Status: DC

## 2019-09-18 NOTE — BH Specialist Note (Signed)
Integrated Behavioral Health Initial Visit  MRN: [id number] Name: Denise Moses  Number of [place] Clinician visits:: 1 Session Start time: 2:00   Session End time: 2:37pm Total time: 37 mins   Type of Service: [place] Interpretor:no  Interpretor Name and Language: none   Warm Hand Off Completed.       SUBJECTIVE: Denise Moses is a 39 y.o. female accompanied by  Patient was referred by P. Constant MD for IBH Patient reports the following concerns of unwanted pregnancy Duration of problem: 4 weeks ; Severity of problem: Mild   OBJECTIVE: Mood: sad  and Affect: flat Risk of harm to self or others: none  LIFE CONTEXT: Family and Social: Lives with boyfriend and children School/Work:  Self-Care: None  Life Changes: Dx last year with Multiple Sclerosis   GOALS ADDRESSED: Patient will: 1. Reduce symptoms of: adjustment disorder with depressed mood  2. Increase knowledge and/or ability of: adjustment disorder  3. Demonstrate ability to: self manage symptoms   INTERVENTIONS: Interventions utilized: Brief supportive counseling  Standardized Assessments completed: none   ASSESSMENT: Patient currently experiencing adjustment disorder with depressed mood    Patient may benefit from psychoeducation regarding symptom of adjustment disorders   PLAN: 1. Follow up with behavioral health clinician on : 2-3 weeks  2. Behavioral recommendations: psychotherapy  3. Referral(s): none  4. "From scale of 1-10, how likely are you to follow plan?":  Lynnea Ferrier, LCSW

## 2019-09-19 LAB — CYTOLOGY - PAP
Comment: NEGATIVE
Diagnosis: NEGATIVE
High risk HPV: NEGATIVE

## 2019-09-19 LAB — CERVICOVAGINAL ANCILLARY ONLY
Chlamydia: NEGATIVE
Comment: NEGATIVE
Comment: NEGATIVE
Comment: NORMAL
Neisseria Gonorrhea: NEGATIVE
Trichomonas: NEGATIVE

## 2019-09-19 LAB — RESULTS CONSOLE HPV: CHL HPV: NEGATIVE

## 2019-09-19 LAB — HM PAP SMEAR

## 2019-09-20 LAB — URINE CULTURE, OB REFLEX

## 2019-09-20 LAB — CULTURE, OB URINE

## 2019-09-24 MED ORDER — CEPHALEXIN 500 MG PO CAPS: 500.0000 mg | ORAL_CAPSULE | Freq: Four times a day (QID) | ORAL | 2 refills | Status: DC

## 2019-09-24 NOTE — Addendum Note (Signed)
Addended by: [name] on: 09/24/2019 08:44 AM   Modules accepted: Orders

## 2019-10-01 ENCOUNTER — Encounter: Payer: Self-pay | Admitting: Obstetrics and Gynecology

## 2019-10-10 ENCOUNTER — Encounter: Payer: Self-pay | Admitting: Obstetrics and Gynecology

## 2019-10-15 ENCOUNTER — Telehealth (INDEPENDENT_AMBULATORY_CARE_PROVIDER_SITE_OTHER): Payer: Medicaid Other | Admitting: Obstetrics and Gynecology

## 2019-10-15 ENCOUNTER — Ambulatory Visit (INDEPENDENT_AMBULATORY_CARE_PROVIDER_SITE_OTHER): Payer: Medicaid Other | Admitting: Licensed Clinical Social Worker

## 2019-10-15 ENCOUNTER — Encounter: Payer: Self-pay | Admitting: Obstetrics and Gynecology

## 2019-10-15 VITALS — BP 151/87

## 2019-10-15 DIAGNOSIS — F4321 Adjustment disorder with depressed mood: Secondary | ICD-10-CM

## 2019-10-15 DIAGNOSIS — O09522 Supervision of elderly multigravida, second trimester: Secondary | ICD-10-CM

## 2019-10-15 DIAGNOSIS — O99210 Obesity complicating pregnancy, unspecified trimester: Secondary | ICD-10-CM

## 2019-10-15 DIAGNOSIS — R8271 Bacteriuria: Secondary | ICD-10-CM

## 2019-10-15 DIAGNOSIS — O10912 Unspecified pre-existing hypertension complicating pregnancy, second trimester: Secondary | ICD-10-CM

## 2019-10-15 DIAGNOSIS — Z3480 Encounter for supervision of other normal pregnancy, unspecified trimester: Secondary | ICD-10-CM

## 2019-10-15 DIAGNOSIS — Z3A14 14 weeks gestation of pregnancy: Secondary | ICD-10-CM

## 2019-10-15 DIAGNOSIS — O10919 Unspecified pre-existing hypertension complicating pregnancy, unspecified trimester: Secondary | ICD-10-CM

## 2019-10-15 MED ORDER — LABETALOL HCL 200 MG PO TABS: 200.0000 mg | ORAL_TABLET | Freq: Two times a day (BID) | ORAL | 3 refills | Status: DC

## 2019-10-15 NOTE — Progress Notes (Signed)
   TELEHEALTH OBSTETRICS PRENATAL VIRTUAL VIDEO VISIT ENCOUNTER NOTE  Provider location: [facility]   I connected with Denise Moses on 10/15/19 at  8:30 AM EDT by MyChart Video Encounter at home and verified that I am speaking with the correct person using two identifiers.   I discussed the limitations, risks, security and privacy concerns of performing an evaluation and management service virtually and the availability of in person appointments. I also discussed with the patient that there may be a patient responsible charge related to this service. The patient expressed understanding and agreed to proceed. Subjective:  Denise Moses is a 39 y.o. [id number] at [redacted]w[redacted]d being seen today for ongoing prenatal care.  She is currently monitored for the following issues for this high-risk pregnancy and has Smoker; Recurrent boils; Family history of breast cancer in first degree relative; Herpes simplex antibody positive; Ruptured left tubal ectopic pregnancy causing hemoperitoneum; Vitamin D deficiency; Iron deficiency anemia; Cervical myelopathy ([place]); Paresthesia; Multiple sclerosis ([place]); GBS bacteriuria; Supervision of other normal pregnancy, antepartum; AMA (advanced maternal age) multigravida 44+; Maternal obesity affecting pregnancy, antepartum; and Chronic hypertension affecting pregnancy on their problem list.  Patient reports no complaints.  Contractions: Not present. Vag. Bleeding: None.  Movement: Absent. Denies any leaking of fluid.   The following portions of the patient's history were reviewed and updated as appropriate: allergies, current medications, past family history, past medical history, past social history, past surgical history and problem list.   Objective:   Vitals:   10/15/19 0836 10/15/19 0850  BP: (!) 158/96 (!) 151/87    Fetal Status:     Movement: Absent     General:  Alert, oriented and cooperative. Patient is in no acute  distress.  Respiratory: Normal respiratory effort, no problems with respiration noted  Mental Status: Normal mood and affect. Normal behavior. Normal judgment and thought content.  Rest of physical exam deferred due to type of encounter  Imaging: No results found.  Assessment and Plan:  Pregnancy: [id number] at [redacted]w[redacted]d 1. Supervision of other normal pregnancy, antepartum Patient is doing well without complaints Anatomy ultrasound scheduled in April  2. Chronic hypertension affecting pregnancy Patient with elevated BP. Will start labetalol 200 BID  3. Multigravida of advanced maternal age in second trimester   4. Maternal obesity affecting pregnancy, antepartum   5. GBS bacteriuria Prophylaxis in labor  Preterm labor symptoms and general obstetric precautions including but not limited to vaginal bleeding, contractions, leaking of fluid and fetal movement were reviewed in detail with the patient. I discussed the assessment and treatment plan with the patient. The patient was provided an opportunity to ask questions and all were answered. The patient agreed with the plan and demonstrated an understanding of the instructions. The patient was advised to call back or seek an in-person office evaluation/go to [facility] for any urgent or concerning symptoms. Please refer to After Visit Summary for other counseling recommendations.   I provided 11 minutes of face-to-face time during this encounter.  Return in about 4 weeks (around 11/12/2019) for Virtual, ROB, High risk.  Future Appointments  Date Time Provider [place]  10/15/2019  9:30 AM Lynnea Ferrier, LCSW [facility] None  11/13/2019  9:30 AM [facility] [place] 5 WH-MFCUS M[facility]-US  11/13/2019 10:30 AM [facility] LAB [facility] WOC    [name], MD [facility]

## 2019-10-15 NOTE — BH Specialist Note (Signed)
Integrated Behavioral Health Follow Up Visit  MRN: [id number] Name: Denise Moses  Number of [place] Clinician visits: 1 Session Start time: 9:32am  Session End time: 9:55pm Total time: 23 mins   Type of Service: [place] Interpretor:no  Interpretor Name and Language: none   SUBJECTIVE: Denise Moses is a 39 y.o. female accompanied by n/a Patient was referred by Rosendo Gros MD  for integrated behavioral health  Patient reports the following symptoms/concerns: worried about this current pregnancy and multiple sclerosis diagnosis Duration of problem: approx four months; Severity of problem: mild  OBJECTIVE: Mood: good  and Affect: congruent  Risk of harm to self or others: No risk of harm to self or others   LIFE CONTEXT: Family and Social:  School/Work: Not currently working  Self-Care: Strategies discussed  Life Changes: Denise Moses diagnosis with multiple sclerosis approx one year ago   GOALS ADDRESSED: Patient will: 1.  Reduce symptoms of: worry, depressed mood and anxiety  2.  Increase knowledge and/or ability of:  Identifying triggers and understanding adjustment disorder  3.  Demonstrate ability to: self manage symptoms   INTERVENTIONS: Interventions utilized:  Psychoeducation, and brief supportive counseling.  Standardized Assessments completed: none  ASSESSMENT: Patient currently experiencing adjustment disorder with depressed mood    Patient may benefit from prioritizing sleep, weather permitting physical activity such as walking, deep breathing exercises and positive affirmations    PLAN: 1. Follow up with behavioral health clinician on : as needed  2. Behavioral recommendations: continue taking prenatal vitamins and collaborate with ob and neurologist  3. Referral(s): none  4. "From scale of 1-10, how likely are you to follow plan?":   Lynnea Ferrier, LCSW

## 2019-10-26 ENCOUNTER — Other Ambulatory Visit: Payer: Self-pay

## 2019-10-26 ENCOUNTER — Telehealth: Payer: Self-pay

## 2019-10-26 DIAGNOSIS — Z3480 Encounter for supervision of other normal pregnancy, unspecified trimester: Secondary | ICD-10-CM

## 2019-10-26 MED ORDER — TERCONAZOLE 0.8 % VA CREA: 1.0000 | TOPICAL_CREAM | Freq: Every day | VAGINAL | 0 refills | Status: DC

## 2019-10-26 NOTE — Telephone Encounter (Signed)
Return call to pt  Regarding Rx refill request for PNV and yeast Rx Pt made aware she has 9 refills on PNV's and yeast treatment sent per protocol.

## 2019-10-27 ENCOUNTER — Encounter (HOSPITAL_COMMUNITY): Payer: Self-pay | Admitting: Emergency Medicine

## 2019-10-27 ENCOUNTER — Other Ambulatory Visit: Payer: Self-pay

## 2019-10-27 ENCOUNTER — Emergency Department (HOSPITAL_COMMUNITY)
Admission: EM | Admit: 2019-10-27 | Discharge: 2019-10-27 | Disposition: A | Discharge status: Home / Self Care | Payer: Medicaid Other | Attending: Emergency Medicine | Admitting: Emergency Medicine

## 2019-10-27 DIAGNOSIS — R112 Nausea with vomiting, unspecified: Secondary | ICD-10-CM

## 2019-10-27 DIAGNOSIS — Z79899 Other long term (current) drug therapy: Secondary | ICD-10-CM | POA: Diagnosis not present

## 2019-10-27 DIAGNOSIS — O219 Vomiting of pregnancy, unspecified: Secondary | ICD-10-CM | POA: Diagnosis not present

## 2019-10-27 DIAGNOSIS — R197 Diarrhea, unspecified: Secondary | ICD-10-CM | POA: Diagnosis not present

## 2019-10-27 DIAGNOSIS — Z20822 Contact with and (suspected) exposure to covid-19: Secondary | ICD-10-CM

## 2019-10-27 DIAGNOSIS — Z87891 Personal history of nicotine dependence: Secondary | ICD-10-CM | POA: Diagnosis not present

## 2019-10-27 DIAGNOSIS — R1084 Generalized abdominal pain: Secondary | ICD-10-CM

## 2019-10-27 DIAGNOSIS — O26892 Other specified pregnancy related conditions, second trimester: Secondary | ICD-10-CM | POA: Diagnosis not present

## 2019-10-27 LAB — URINALYSIS, ROUTINE W REFLEX MICROSCOPIC
Bilirubin Urine: NEGATIVE
Glucose, UA: NEGATIVE mg/dL
Hgb urine dipstick: NEGATIVE
Ketones, ur: 80 mg/dL — AB
Leukocytes,Ua: NEGATIVE
Nitrite: NEGATIVE
Protein, ur: 30 mg/dL — AB
Specific Gravity, Urine: 1.02 (ref 1.005–1.030)
pH: 6 (ref 5.0–8.0)

## 2019-10-27 LAB — COMPREHENSIVE METABOLIC PANEL
ALT: 71 U/L — ABNORMAL HIGH (ref 0–44)
AST: 61 U/L — ABNORMAL HIGH (ref 15–41)
Albumin: 3.3 g/dL — ABNORMAL LOW (ref 3.5–5.0)
Alkaline Phosphatase: 66 U/L (ref 38–126)
Anion gap: 10 (ref 5–15)
BUN: <5 mg/dL — ABNORMAL LOW (ref 6–20)
CO2: 19 mmol/L — ABNORMAL LOW (ref 22–32)
Calcium: 9.3 mg/dL (ref 8.9–10.3)
Chloride: 104 mmol/L (ref 98–111)
Creatinine, Ser: 0.57 mg/dL (ref 0.44–1.00)
GFR calc Af Amer: >60 mL/min (ref >60)
GFR calc non Af Amer: >60 mL/min (ref >60)
Glucose, Bld: 95 mg/dL (ref 70–99)
Potassium: 4.2 mmol/L (ref 3.5–5.1)
Sodium: 133 mmol/L — ABNORMAL LOW (ref 135–145)
Total Bilirubin: 0.7 mg/dL (ref 0.3–1.2)
Total Protein: 7.1 g/dL (ref 6.5–8.1)

## 2019-10-27 LAB — HCG, QUANTITATIVE, PREGNANCY: hCG, Beta Chain, Quant, S: 26494 m[IU]/mL — ABNORMAL HIGH (ref <5)

## 2019-10-27 LAB — LIPASE, BLOOD: Lipase: 16 U/L (ref 11–51)

## 2019-10-27 LAB — CBC
HCT: 37.9 % (ref 36.0–46.0)
Hemoglobin: 12.4 g/dL (ref 12.0–15.0)
MCH: 28.4 pg (ref 26.0–34.0)
MCHC: 32.7 g/dL (ref 30.0–36.0)
MCV: 86.9 fL (ref 80.0–100.0)
Platelets: 244 10*3/uL (ref 150–400)
RBC: 4.36 MIL/uL (ref 3.87–5.11)
RDW: 16 % — ABNORMAL HIGH (ref 11.5–15.5)
WBC: 9.8 10*3/uL (ref 4.0–10.5)
nRBC: 0 % (ref 0.0–0.2)

## 2019-10-27 LAB — SARS CORONAVIRUS 2 (TAT 6-24 HRS): SARS Coronavirus 2: NEGATIVE

## 2019-10-27 LAB — POC SARS CORONAVIRUS 2 AG -  ED: SARS Coronavirus 2 Ag: NEGATIVE

## 2019-10-27 MED ORDER — ONDANSETRON HCL 4 MG/2ML IJ SOLN
4.0000 mg | Freq: Once | INTRAMUSCULAR | Status: AC
  Administered 2019-10-27: 4 mg via INTRAVENOUS
  Filled 2019-10-27: qty 2

## 2019-10-27 MED ORDER — SODIUM CHLORIDE 0.9 % IV BOLUS
1000.0000 mL | Freq: Once | INTRAVENOUS | Status: AC
  Administered 2019-10-27: 1000 mL via INTRAVENOUS

## 2019-10-27 MED ORDER — SODIUM CHLORIDE 0.9% FLUSH: 3.0000 mL | Freq: Once | INTRAVENOUS | Status: DC

## 2019-10-27 MED ORDER — ONDANSETRON 4 MG PO TBDP: 4.0000 mg | ORAL_TABLET | Freq: Three times a day (TID) | ORAL | 0 refills | Status: DC | PRN

## 2019-10-27 NOTE — ED Notes (Signed)
Patient verbalizes understanding of discharge instructions. Opportunity for questioning and answers were provided. Pt discharged from ED. 

## 2019-10-27 NOTE — Discharge Instructions (Signed)
Your labwork was reassuring today We have swabbed you for COVID 19 - please stay home and self isolate until you receive your results. If positive we will call you. You will need to self isolate for 10 days if you test positive. Cleared: 04/07. If negative you may resume normal daily activity.  Please follow up with both OBGYN and your PCP Drink plenty of fluids to stay hydrated  Return to the ED IMMEDIATELY for any worsening symptoms including worsening pain, excessive vomiting, vomiting blood, passing out, rush of fluids, vaginal bleeding, fevers > 100.4

## 2019-10-27 NOTE — ED Notes (Addendum)
Pt given coke and crackers. Pt tolerated well.

## 2019-10-27 NOTE — ED Triage Notes (Signed)
Pt states she is 4 months pregnant.  C/o generalized body aches, abd pain, nausea, vomiting, and diarrhea since last night.  Denies known COVID exposure.

## 2019-10-27 NOTE — ED Provider Notes (Signed)
[facility] EMERGENCY DEPARTMENT Provider Note   CSN: [id number] Arrival date & time: 10/27/19  [id number]     History Chief Complaint  Patient presents with  . Abdominal Pain  . Generalized Body Aches    Earther Moses is a 39 y.o. female [phone] who is currently [redacted]w[redacted]d pregnant with complaint of gradual onset, constant, diffuse, abdominal pain that began last night. Pt also complains of nausea, nonbloody nonbilious emesis, watery diarrhea, myalgias, and HA. Pt reports hx of headaches in the past and that this feels similar. She reports she has felt baby move in the past however states she has "only felt pain" over the past day. Pt is vague when asked regarding if she has had rush of fluids - she states she was on the toilet and felt a rush of fluids however cannot determine if this was urine or not. No vaginal bleeding. Pt denies recent COVID 19 positive exposure. No fevers or chills.   The history is provided by the patient and medical records.       Past Medical History:  Diagnosis Date  . Abnormal MRI, cervical spine   . Anemia   . Elective abortion   . Heart murmur   . Herpes   . Infection    UTI  . Pregnancy induced hypertension    2001  . Vitamin D deficiency     Patient Active Problem List   Diagnosis Date Noted  . AMA (advanced maternal age) multigravida 35+ 09/17/2019  . Maternal obesity affecting pregnancy, antepartum 09/17/2019  . Chronic hypertension affecting pregnancy 09/17/2019  . Supervision of other normal pregnancy, antepartum 09/10/2019  . GBS bacteriuria 08/19/2019  . Multiple sclerosis ([place]) 04/30/2019  . Cervical myelopathy ([place]) 04/12/2019  . Paresthesia 04/12/2019  . Vitamin D deficiency 07/05/2017  . Iron deficiency anemia 07/05/2017  . Ruptured left tubal ectopic pregnancy causing hemoperitoneum 06/09/2016  . 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 Recurrent boils 04/13/2016  . Family history of breast cancer in  first degree relative 04/13/2016  . Smoker 07/03/2014    Past Surgical History:  Procedure Laterality Date  . CHOLECYSTECTOMY    . LAPAROSCOPY N/A 06/09/2016   Procedure: LAPAROSCOPY OPERATIVE;  Surgeon: Osborne Oman, MD;  Location: [facility];  Service: Gynecology;  Laterality: N/A;  ectopic   . THERAPEUTIC ABORTION     x 4  . UNILATERAL SALPINGECTOMY Left 06/09/2016   Procedure: UNILATERAL SALPINGECTOMY, REMOVAL OF IUD;  Surgeon: Osborne Oman, MD;  Location: [facility];  Service: Gynecology;  Laterality: Left;  . WISDOM TOOTH EXTRACTION       OB History    Gravida  12   Para  5   Term  4   Preterm  1   AB  6   Living  5     SAB  2   TAB  3   Ectopic  1   Multiple      Live Births  5           Family History  Problem Relation Age of Onset  . Breast cancer Mother 89       was a smoker, alcohol drinker in early life   . Breast cancer Maternal Aunt   . Cancer Maternal Grandfather        unsure of type  . Other Father        unsure of history  . Breast cancer Maternal Aunt     Social History  Tobacco Use  . Smoking status: Former Smoker    Packs/day: 0.25    Years: 10.00    Pack years: 2.50    Types: Cigarettes    Quit date: 03/2019    Years since quitting: 0.6  . Smokeless tobacco: Never Used  . Tobacco comment: 2-3 cigs/day  Substance Use Topics  . Alcohol use: Not Currently    Comment: social drinker  . Drug use: Yes    Types: Marijuana    Comment: occasional- last use on new years eve    Home Medications Prior to Admission medications   Medication Sig Start Date End Date Taking? Authorizing Provider  cholecalciferol (VITAMIN D3) 25 MCG (1000 UT) tablet Take 1,000 Units by mouth daily.   Yes [provider]  gabapentin (NEURONTIN) 300 MG capsule Take 1 capsule (300 mg total) by mouth 3 (three) times daily. 06/12/19  Yes [name], MD  labetalol (NORMODYNE) 200 MG tablet Take 1 tablet (200 mg total) by mouth 2 (two) times  daily. 10/15/19  Yes Constant, Peggy, MD  Prenat-FeAsp-Meth-FA-DHA w/o A (PRENATE PIXIE) 10-0.6-0.4-200 MG CAPS Take 1 tablet by mouth daily. 09/17/19  Yes Constant, Peggy, MD  promethazine (PHENERGAN) 25 MG tablet Take 1 tablet (25 mg total) by mouth every 6 (six) hours as needed for nausea or vomiting. 09/18/19  Yes Constant, Peggy, MD  aspirin EC 81 MG tablet Take 1 tablet (81 mg total) by mouth daily. Take after 12 weeks for prevention of preeclampsia later in pregnancy Patient not taking: Reported on 10/27/2019 09/17/19   Constant, Peggy, MD  cephALEXin (KEFLEX) 500 MG capsule Take 1 capsule (500 mg total) by mouth 4 (four) times daily. Patient not taking: Reported on 10/27/2019 09/24/19   Constant, Peggy, MD  Doxylamine-Pyridoxine (DICLEGIS) 10-10 MG TBEC Take 2 tablets by mouth at bedtime. If symptoms persist, add one tablet in the morning and one in the afternoon Patient not taking: Reported on 10/27/2019 09/18/19   Constant, Peggy, MD  ondansetron (ZOFRAN ODT) 4 MG disintegrating tablet Take 1 tablet (4 mg total) by mouth every 8 (eight) hours as needed for nausea or vomiting. 10/27/19   Eustaquio Maize, PA-C  terconazole (TERAZOL 3) 0.8 % vaginal cream Place 1 applicator vaginally at bedtime. Apply nightly for three nights. 10/26/19   Shelly Bombard, MD    Allergies    Patient has no known allergies.  Review of Systems   Review of Systems  Constitutional: Negative for chills and fever.  Respiratory: Negative for shortness of breath.   Cardiovascular: Negative for chest pain.  Gastrointestinal: Positive for abdominal pain, diarrhea, nausea and vomiting.  Genitourinary: Negative for vaginal bleeding and vaginal discharge.  Musculoskeletal: Positive for myalgias.  All other systems reviewed and are negative.   Physical Exam Updated Vital Signs BP (!) 137/93   Pulse 98   Temp 98.7 F (37.1 C) (Oral)   Resp 16   LMP 07/03/2019   SpO2 95%   Physical Exam Vitals and nursing note  reviewed.  Constitutional:      Appearance: She is not ill-appearing or diaphoretic.  HENT:     Head: Normocephalic and atraumatic.  Eyes:     Conjunctiva/sclera: Conjunctivae normal.  Cardiovascular:     Rate and Rhythm: Normal rate and regular rhythm.     Heart sounds: Normal heart sounds.  Pulmonary:     Effort: Pulmonary effort is normal.     Breath sounds: Normal breath sounds. No wheezing, rhonchi or rales.  Abdominal:  General: Bowel sounds are normal.     Palpations: Abdomen is soft.     Tenderness: There is generalized abdominal tenderness. There is no right CVA tenderness, left CVA tenderness, guarding or rebound.  Musculoskeletal:     Cervical back: Neck supple.     Comments: 2+ patellar reflexes bilaterally  Skin:    General: Skin is warm and dry.  Neurological:     Mental Status: She is alert.     ED Results / Procedures / Treatments   Labs (all labs ordered are listed, but only abnormal results are displayed) Labs Reviewed  COMPREHENSIVE METABOLIC PANEL - Abnormal; Notable for the following components:      Result Value   Sodium 133 (*)    CO2 19 (*)    BUN <5 (*)    Albumin 3.3 (*)    AST 61 (*)    ALT 71 (*)    All other components within normal limits  CBC - Abnormal; Notable for the following components:   RDW 16.0 (*)    All other components within normal limits  URINALYSIS, ROUTINE W REFLEX MICROSCOPIC - Abnormal; Notable for the following components:   Ketones, ur 80 (*)    Protein, ur 30 (*)    Bacteria, UA RARE (*)    All other components within normal limits  HCG, QUANTITATIVE, PREGNANCY - Abnormal; Notable for the following components:   hCG, Beta Chain, Quant, S 26,494 (*)    All other components within normal limits  SARS CORONAVIRUS 2 (TAT 6-24 HRS)  LIPASE, BLOOD  POC SARS CORONAVIRUS 2 AG -  ED    EKG None  Radiology No results found.  Procedures Procedures (including critical care time)  Medications Ordered in  ED Medications  sodium chloride flush (NS) 0.9 % injection 3 mL (has no administration in time range)  ondansetron (ZOFRAN) injection 4 mg (4 mg Intravenous Given 10/27/19 1221)  sodium chloride 0.9 % bolus 1,000 mL (1,000 mLs Intravenous New Bag/Given 10/27/19 1221)    ED Course  I have reviewed the triage vital signs and the nursing notes.  Pertinent labs & imaging results that were available during my care of the patient were reviewed by me and considered in my medical decision making (see chart for details).    Clinical Course as of Oct 26 1437  Sat Oct 27, 2019  1257 SARS Coronavirus 2 Ag: NEGATIVE [MV]  1342 Discussed case with OBGYN Dr. Roselie Awkward who does not believe there is a necessity for ultrasound at this time. Recommends close outpatient follow up with OBGYN   [MV]  1432 Hx cholecystectomy  AST(!): 61 [MV]    Clinical Course User Index [MV] Eustaquio Maize, PA-C   MDM Rules/Calculators/A&P                     39 year old female who is currently 16 weeks and 5 days pregnant presents to the ED today complaining of Covid-like symptoms including diffuse abdominal pain, nausea, vomiting, diarrhea, body aches.  Recent COVID-19 positive exposure.  Patient states that she was able to feel baby kick at one point but has not since being in pain. Fetal HR 112 at bedside.  Denies vaginal bleeding.  Vague on the fluids however with detected fetal heart tone I am not concerned about this.  Will test for Covid and obtain screening labs.  Symptomatic relief with fluids and Zofran.  We will continue to monitor.  On arrival to the ED patient is afebrile,  tachycardic in the 110s, nontachypneic.  Suspect rate will decrease with fluids.  CBC without leukocytosis.  Hemoglobin stable. CMP with sodium 133, patient already receiving fluids.  Bicarb 19.  Albumin 3.3.  AST and ALT mildly elevated however patient with history of cholecystectomy.  Not concerned at this time.  Lipase 16.  UA with ketones  and protein.  Patient blood pressure was mildly elevated on arrival to the ED however has normalized.  Most recent 127/78.  She was recently started on labetalol 200 mg during telemedicine visit with Dr. Elly Modena 2 weeks ago.  2+ patellar reflexes bilaterally. Rapid Covid negative.  Fluids and Zofran patient states she feels improved.  I have touched base with OB/GYN on-call Dr. Roselie Awkward who recommends close outpatient OB/GYN follow-up but does not think a ultrasound is necessary at this time which I agree with given we have been able to obtain fetal heart tones in patients symptoms are more consistent with Covid or viral gastroenteritis.   Patient fluid challenged this fully.  Able to tolerate fluids and crackers without difficulty.  Patient to be discharged home at this time.  Have given prescriptions for Zofran.  Patient advised to follow-up with OB/GYN for further evaluation and to wait her COVID-19 test results as a send out test was obtained.  She has been given work note to stay out of work until she receives her results.  Advised to self isolate for 10 days if positive.  Strict return precautions have been discussed with patient.  These include worsening pain, vomiting blood, rush of fluids, vaginal bleeding.  She is in agreement with plan and stable for discharge home.   This note was prepared using Dragon voice recognition software and may include unintentional dictation errors due to the inherent limitations of voice recognition software.  Denise Moses was evaluated in Emergency Department on 10/27/2019 for the symptoms described in the history of present illness. She was evaluated in the context of the global COVID-19 pandemic, which necessitated consideration that the patient might be at risk for infection with the SARS-CoV-2 virus that causes COVID-19. Institutional protocols and algorithms that pertain to the evaluation of patients at risk for COVID-19 are in a state of rapid change  based on information released by regulatory bodies including the CDC and federal and state organizations. These policies and algorithms were followed during the patient's care in the ED.  Final Clinical Impression(s) / ED Diagnoses Final diagnoses:  Generalized abdominal pain  Nausea vomiting and diarrhea  Person under investigation for COVID-19    Rx / DC Orders ED Discharge Orders         Ordered    ondansetron (ZOFRAN ODT) 4 MG disintegrating tablet  Every 8 hours PRN     10/27/19 1434           Discharge Instructions     Your labwork was reassuring today We have swabbed you for COVID 19 - please stay home and self isolate until you receive your results. If positive we will call you. You will need to self isolate for 10 days if you test positive. Cleared: 04/07. If negative you may resume normal daily activity.  Please follow up with both OBGYN and your PCP Drink plenty of fluids to stay hydrated  Return to the ED IMMEDIATELY for any worsening symptoms including worsening pain, excessive vomiting, vomiting blood, passing out, rush of fluids, vaginal bleeding, fevers > 100.4       Eustaquio Maize, [place] 10/27/19 1443  Quintella Reichert, MD 10/27/19 [phone]

## 2019-11-12 ENCOUNTER — Telehealth (INDEPENDENT_AMBULATORY_CARE_PROVIDER_SITE_OTHER): Payer: Medicaid Other | Admitting: Obstetrics and Gynecology

## 2019-11-12 ENCOUNTER — Encounter: Payer: Self-pay | Admitting: Obstetrics and Gynecology

## 2019-11-12 DIAGNOSIS — Z3A18 18 weeks gestation of pregnancy: Secondary | ICD-10-CM

## 2019-11-12 DIAGNOSIS — O99210 Obesity complicating pregnancy, unspecified trimester: Secondary | ICD-10-CM

## 2019-11-12 DIAGNOSIS — O99212 Obesity complicating pregnancy, second trimester: Secondary | ICD-10-CM

## 2019-11-12 DIAGNOSIS — O10919 Unspecified pre-existing hypertension complicating pregnancy, unspecified trimester: Secondary | ICD-10-CM

## 2019-11-12 DIAGNOSIS — O99820 Streptococcus B carrier state complicating pregnancy: Secondary | ICD-10-CM

## 2019-11-12 DIAGNOSIS — O09522 Supervision of elderly multigravida, second trimester: Secondary | ICD-10-CM

## 2019-11-12 DIAGNOSIS — O10912 Unspecified pre-existing hypertension complicating pregnancy, second trimester: Secondary | ICD-10-CM

## 2019-11-12 DIAGNOSIS — R8271 Bacteriuria: Secondary | ICD-10-CM

## 2019-11-12 DIAGNOSIS — Z3480 Encounter for supervision of other normal pregnancy, unspecified trimester: Secondary | ICD-10-CM

## 2019-11-12 DIAGNOSIS — E669 Obesity, unspecified: Secondary | ICD-10-CM

## 2019-11-12 NOTE — Progress Notes (Signed)
   OBSTETRICS PRENATAL VIRTUAL VISIT ENCOUNTER NOTE  Provider location: [facility] [place]   I connected with [name] on 11/12/19 at  9:45 AM EDT by MyChart Video Encounter at home and verified that I am speaking with the correct person using two identifiers.   I discussed the limitations, risks, security and privacy concerns of performing an evaluation and management service virtually and the availability of in person appointments. I also discussed with the patient that there may be a patient responsible charge related to this service. The patient expressed understanding and agreed to proceed. Subjective:  Denise Moses is a 39 y.o. [id number] at [redacted]w[redacted]d being seen today for ongoing prenatal care.  She is currently monitored for the following issues for this high-risk pregnancy and has Smoker; Recurrent boils; Family history of breast cancer in first degree relative; Herpes simplex antibody positive; Ruptured left tubal ectopic pregnancy causing hemoperitoneum; Vitamin D deficiency; Iron deficiency anemia; Cervical myelopathy ([place]); Paresthesia; Multiple sclerosis ([place]); GBS bacteriuria; Supervision of other normal pregnancy, antepartum; AMA (advanced maternal age) multigravida 51+; Maternal obesity affecting pregnancy, antepartum; and Chronic hypertension affecting pregnancy on their problem list.  Patient reports no complaints.  Contractions: Not present.  .  Movement: Present. Denies any leaking of fluid.   The following portions of the patient's history were reviewed and updated as appropriate: allergies, current medications, past family history, past medical history, past social history, past surgical history and problem list.   Objective:  There were no vitals filed for this visit.  Fetal Status:     Movement: Present     General:  Alert, oriented and cooperative. Patient is in no acute distress.  Respiratory: Normal respiratory effort, no problems  with respiration noted  Mental Status: Normal mood and affect. Normal behavior. Normal judgment and thought content.  Rest of physical exam deferred due to type of encounter  Imaging: No results found.  Assessment and Plan:  Pregnancy: [id number] at [redacted]w[redacted]d 1. Supervision of other normal pregnancy, antepartum Patient is doing well without complaints Anatomy and AFP tomorrow at [facility]  2. Chronic hypertension affecting pregnancy Patient reports BP 148/79 yesterday Continue labetalol and ASA  3. Multigravida of advanced maternal age in second trimester   4. Maternal obesity affecting pregnancy, antepartum   5. GBS bacteriuria Prophylaxis in labor  Preterm labor symptoms and general obstetric precautions including but not limited to vaginal bleeding, contractions, leaking of fluid and fetal movement were reviewed in detail with the patient. I discussed the assessment and treatment plan with the patient. The patient was provided an opportunity to ask questions and all were answered. The patient agreed with the plan and demonstrated an understanding of the instructions. The patient was advised to call back or seek an in-person office evaluation/go to [facility] at [facility] for any urgent or concerning symptoms. Please refer to After Visit Summary for other counseling recommendations.   I provided 11 minutes of face-to-face time during this encounter.  No follow-ups on file.  Future Appointments  Date Time Provider [place]  11/12/2019  9:45 AM Christorpher Hisaw, Vickii Chafe, MD [place] None  11/13/2019  9:30 AM WH-MFC [place] 5 WH-MFCUS MFC-US  11/13/2019 10:30 AM WOC-WOCA LAB WOC-WOCA WOC    [name], MD [facility] [place]

## 2019-11-12 NOTE — Progress Notes (Signed)
I connected with  Denise Moses on 11/12/19 at  9:45 AM EDT by telephone and verified that I am speaking with the correct person using two identifiers.   I discussed the limitations, risks, security and privacy concerns of performing an evaluation and management service by telephone and the availability of in person appointments. I also discussed with the patient that there may be a patient responsible charge related to this service. The patient expressed understanding and agreed to proceed.  Denise Moses, CMA 11/12/2019  9:29 AM

## 2019-11-13 ENCOUNTER — Other Ambulatory Visit: Payer: Self-pay

## 2019-11-13 ENCOUNTER — Other Ambulatory Visit (HOSPITAL_COMMUNITY): Payer: Self-pay | Admitting: *Deleted

## 2019-11-13 ENCOUNTER — Other Ambulatory Visit: Payer: Medicaid Other

## 2019-11-13 ENCOUNTER — Ambulatory Visit (HOSPITAL_COMMUNITY)
Admission: RE | Admit: 2019-11-13 | Discharge: 2019-11-13 | Disposition: A | Discharge status: Home / Self Care | Payer: Medicaid Other | Source: Ambulatory Visit | Attending: Obstetrics and Gynecology | Admitting: Obstetrics and Gynecology

## 2019-11-13 ENCOUNTER — Other Ambulatory Visit: Payer: Self-pay | Admitting: Obstetrics and Gynecology

## 2019-11-13 DIAGNOSIS — O09522 Supervision of elderly multigravida, second trimester: Secondary | ICD-10-CM

## 2019-11-13 DIAGNOSIS — Z3A19 19 weeks gestation of pregnancy: Secondary | ICD-10-CM

## 2019-11-13 DIAGNOSIS — Z3480 Encounter for supervision of other normal pregnancy, unspecified trimester: Secondary | ICD-10-CM

## 2019-11-13 DIAGNOSIS — Z3482 Encounter for supervision of other normal pregnancy, second trimester: Secondary | ICD-10-CM

## 2019-11-13 DIAGNOSIS — O10912 Unspecified pre-existing hypertension complicating pregnancy, second trimester: Secondary | ICD-10-CM

## 2019-11-15 LAB — AFP, SERUM, OPEN SPINA BIFIDA
AFP MoM: 1.17
AFP Value: 48.6 ng/mL
Gest. Age on Collection Date: 19 weeks
Maternal Age At EDD: 40.2 yr
OSBR Risk 1 IN: 10000
Test Results:: NEGATIVE
Weight: 250 [lb_av]

## 2019-12-07 ENCOUNTER — Ambulatory Visit (HOSPITAL_COMMUNITY): Payer: Medicaid Other | Attending: Obstetrics and Gynecology

## 2019-12-07 ENCOUNTER — Other Ambulatory Visit: Payer: Self-pay | Admitting: *Deleted

## 2019-12-07 ENCOUNTER — Other Ambulatory Visit: Payer: Self-pay

## 2019-12-07 DIAGNOSIS — G35 Multiple sclerosis: Secondary | ICD-10-CM | POA: Diagnosis not present

## 2019-12-07 DIAGNOSIS — O10012 Pre-existing essential hypertension complicating pregnancy, second trimester: Secondary | ICD-10-CM

## 2019-12-07 DIAGNOSIS — O10912 Unspecified pre-existing hypertension complicating pregnancy, second trimester: Secondary | ICD-10-CM | POA: Diagnosis not present

## 2019-12-07 DIAGNOSIS — Z148 Genetic carrier of other disease: Secondary | ICD-10-CM | POA: Diagnosis not present

## 2019-12-07 DIAGNOSIS — O99212 Obesity complicating pregnancy, second trimester: Secondary | ICD-10-CM

## 2019-12-07 DIAGNOSIS — Z3A22 22 weeks gestation of pregnancy: Secondary | ICD-10-CM | POA: Diagnosis not present

## 2019-12-07 DIAGNOSIS — E669 Obesity, unspecified: Secondary | ICD-10-CM | POA: Diagnosis not present

## 2019-12-07 DIAGNOSIS — O99352 Diseases of the nervous system complicating pregnancy, second trimester: Secondary | ICD-10-CM

## 2019-12-07 DIAGNOSIS — O09522 Supervision of elderly multigravida, second trimester: Secondary | ICD-10-CM | POA: Diagnosis not present

## 2019-12-07 DIAGNOSIS — Z363 Encounter for antenatal screening for malformations: Secondary | ICD-10-CM | POA: Diagnosis not present

## 2019-12-07 DIAGNOSIS — Z3689 Encounter for other specified antenatal screening: Secondary | ICD-10-CM

## 2019-12-10 ENCOUNTER — Encounter: Payer: Medicaid Other | Admitting: Obstetrics and Gynecology

## 2019-12-18 ENCOUNTER — Encounter: Payer: Self-pay | Admitting: Obstetrics and Gynecology

## 2019-12-18 ENCOUNTER — Other Ambulatory Visit: Payer: Self-pay

## 2019-12-18 ENCOUNTER — Ambulatory Visit (INDEPENDENT_AMBULATORY_CARE_PROVIDER_SITE_OTHER): Payer: Medicaid Other | Admitting: Obstetrics and Gynecology

## 2019-12-18 VITALS — BP 135/79 | HR 97 | Wt 259.0 lb

## 2019-12-18 DIAGNOSIS — R8271 Bacteriuria: Secondary | ICD-10-CM

## 2019-12-18 DIAGNOSIS — D563 Thalassemia minor: Secondary | ICD-10-CM

## 2019-12-18 DIAGNOSIS — O09522 Supervision of elderly multigravida, second trimester: Secondary | ICD-10-CM

## 2019-12-18 DIAGNOSIS — Z3480 Encounter for supervision of other normal pregnancy, unspecified trimester: Secondary | ICD-10-CM

## 2019-12-18 DIAGNOSIS — O10919 Unspecified pre-existing hypertension complicating pregnancy, unspecified trimester: Secondary | ICD-10-CM

## 2019-12-18 NOTE — Progress Notes (Signed)
Pt is here for ROB, [redacted]w[redacted]d.

## 2019-12-18 NOTE — Progress Notes (Signed)
Subjective:  Denise Moses is a 39 y.o. [id number] at [redacted]w[redacted]d being seen today for ongoing prenatal care.  She is currently monitored for the following issues for this high-risk pregnancy and has Smoker; Recurrent boils; Family history of breast cancer in first degree relative; Herpes simplex antibody positive; Vitamin D deficiency; Iron deficiency anemia; Cervical myelopathy ([place]); Paresthesia; Multiple sclerosis ([place]); GBS bacteriuria; Supervision of other normal pregnancy, antepartum; AMA (advanced maternal age) multigravida 37+; Maternal obesity affecting pregnancy, antepartum; Chronic hypertension affecting pregnancy; and Alpha thalassemia silent carrier on their problem list.  Patient reports general discomforts of pregnancy.  Contractions: Irritability. Vag. Bleeding: None.  Movement: Present. Denies leaking of fluid.   The following portions of the patient's history were reviewed and updated as appropriate: allergies, current medications, past family history, past medical history, past social history, past surgical history and problem list. Problem list updated.  Objective:   Vitals:   12/18/19 0850  BP: 135/79  Pulse: 97  Weight: 259 lb (117.5 kg)    Fetal Status: Fetal Heart Rate (bpm): 155   Movement: Present     General:  Alert, oriented and cooperative. Patient is in no acute distress.  Skin: Skin is warm and dry. No rash noted.   Cardiovascular: Normal heart rate noted  Respiratory: Normal respiratory effort, no problems with respiration noted  Abdomen: Soft, gravid, appropriate for gestational age. Pain/Pressure: Absent     Pelvic:  Cervical exam deferred        Extremities: Normal range of motion.  Edema: Trace  Mental Status: Normal mood and affect. Normal behavior. Normal judgment and thought content.   Urinalysis:      Assessment and Plan:  Pregnancy: [id number] at [redacted]w[redacted]d  1. Supervision of other normal pregnancy, antepartum Stable Glucola next visit  2.  Chronic hypertension affecting pregnancy BP stable Continue with current regiment Growth scans q 4 weeks per MFM Antenatal testing starting at 32 weeks  3. Multigravida of advanced maternal age in second trimester NIPS low risk, AFP negtaive  4. GBS bacteriuria Tx while in labor  5. Alpha thalassemia silent carrier   Preterm labor symptoms and general obstetric precautions including but not limited to vaginal bleeding, contractions, leaking of fluid and fetal movement were reviewed in detail with the patient. Please refer to After Visit Summary for other counseling recommendations.  Return in about 4 weeks (around 01/15/2020) for face to face, MD only, fasting for Glucola.   Chancy Milroy, MD

## 2019-12-18 NOTE — Patient Instructions (Signed)

## 2020-01-01 ENCOUNTER — Other Ambulatory Visit: Payer: Self-pay

## 2020-01-01 ENCOUNTER — Emergency Department (HOSPITAL_COMMUNITY)
Admission: EM | Admit: 2020-01-01 | Discharge: 2020-01-01 | Disposition: A | Discharge status: Home / Self Care | Payer: Medicaid Other | Attending: Emergency Medicine | Admitting: Emergency Medicine

## 2020-01-01 ENCOUNTER — Encounter (HOSPITAL_COMMUNITY): Payer: Self-pay

## 2020-01-01 DIAGNOSIS — L02412 Cutaneous abscess of left axilla: Secondary | ICD-10-CM | POA: Diagnosis not present

## 2020-01-01 DIAGNOSIS — Z7982 Long term (current) use of aspirin: Secondary | ICD-10-CM | POA: Diagnosis not present

## 2020-01-01 DIAGNOSIS — G35 Multiple sclerosis: Secondary | ICD-10-CM | POA: Insufficient documentation

## 2020-01-01 DIAGNOSIS — Z331 Pregnant state, incidental: Secondary | ICD-10-CM | POA: Insufficient documentation

## 2020-01-01 DIAGNOSIS — Z79899 Other long term (current) drug therapy: Secondary | ICD-10-CM | POA: Diagnosis not present

## 2020-01-01 DIAGNOSIS — Z87891 Personal history of nicotine dependence: Secondary | ICD-10-CM | POA: Insufficient documentation

## 2020-01-01 DIAGNOSIS — I10 Essential (primary) hypertension: Secondary | ICD-10-CM | POA: Insufficient documentation

## 2020-01-01 MED ORDER — LIDOCAINE-EPINEPHRINE (PF) 2 %-1:200000 IJ SOLN
10.0000 mL | Freq: Once | INTRAMUSCULAR | Status: AC
  Administered 2020-01-01: 10 mL
  Filled 2020-01-01: qty 20

## 2020-01-01 NOTE — ED Provider Notes (Signed)
[facility] EMERGENCY DEPARTMENT Provider Note   CSN: [id number] Arrival date & time: 01/01/20  [id number]     History No chief complaint on file.   Denise Moses is a 39 y.o. female.  The history is provided by the patient and medical records.  Abscess Location:  Shoulder/arm Shoulder/arm abscess location:  L axilla Size:  Golf ball Abscess quality: fluctuance, induration and painful   Abscess quality: not draining, no redness and not weeping   Red streaking: no   Duration:  4 days Progression:  Worsening Pain details:    Quality:  Throbbing, tightness and shooting   Severity:  Moderate   Duration:  4 days   Timing:  Constant   Progression:  Worsening Chronicity:  Recurrent Context comment:  Prior hx of abscess in the axilla but last one was 1 year ago Relieved by:  Nothing Worsened by:  Draining/squeezing Ineffective treatments:  Warm compresses and warm water soaks Associated symptoms: no fever, no nausea and no vomiting   Risk factors: prior abscess   Risk factors comment:  Pt is currently pregnant with hx of chronic hypertension.      Past Medical History:  Diagnosis Date   Abnormal MRI, cervical spine    Anemia    Elective abortion    Heart murmur    Herpes    Infection    UTI   Pregnancy induced hypertension    2001   Vitamin D deficiency     Patient Active Problem List   Diagnosis Date Noted   Alpha thalassemia silent carrier 12/18/2019   AMA (advanced maternal age) multigravida 35+ 09/17/2019   Maternal obesity affecting pregnancy, antepartum 09/17/2019   Chronic hypertension affecting pregnancy 09/17/2019   Supervision of other normal pregnancy, antepartum 09/10/2019   GBS bacteriuria 08/19/2019   Multiple sclerosis ([place]) 04/30/2019   Cervical myelopathy ([place]) 04/12/2019   Paresthesia 04/12/2019   Vitamin D deficiency 07/05/2017   Iron deficiency anemia 07/05/2017   Herpes simplex antibody positive  04/15/2016   Recurrent boils 04/13/2016   Family history of breast cancer in first degree relative 04/13/2016   Smoker 07/03/2014    Past Surgical History:  Procedure Laterality Date   CHOLECYSTECTOMY     LAPAROSCOPY N/A 06/09/2016   Procedure: LAPAROSCOPY OPERATIVE;  Surgeon: Osborne Oman, MD;  Location: [facility];  Service: Gynecology;  Laterality: N/A;  ectopic    THERAPEUTIC ABORTION     x 4   UNILATERAL SALPINGECTOMY Left 06/09/2016   Procedure: UNILATERAL SALPINGECTOMY, REMOVAL OF IUD;  Surgeon: Osborne Oman, MD;  Location: [facility];  Service: Gynecology;  Laterality: Left;   WISDOM TOOTH EXTRACTION       OB History    Gravida  12   Para  5   Term  4   Preterm  1   AB  6   Living  5     SAB  2   TAB  3   Ectopic  1   Multiple      Live Births  73           Family History  Problem Relation Age of Onset   Breast cancer Mother 28       was a smoker, alcohol drinker in early life    Breast cancer Maternal Aunt    Cancer Maternal Grandfather        unsure of type   Other Father        unsure of history  Breast cancer Maternal Aunt     Social History   Tobacco Use   Smoking status: Former Smoker    Packs/day: 0.25    Years: 10.00    Pack years: 2.50    Types: Cigarettes    Quit date: 03/2019    Years since quitting: 0.8   Smokeless tobacco: Never Used   Tobacco comment: 2-3 cigs/day  Substance Use Topics   Alcohol use: Not Currently    Comment: social drinker   Drug use: Yes    Types: Marijuana    Comment: occasional- last use on new years eve    Home Medications Prior to Admission medications   Medication Sig Start Date End Date Taking? Authorizing Provider  aspirin EC 81 MG tablet Take 1 tablet (81 mg total) by mouth daily. Take after 12 weeks for prevention of preeclampsia later in pregnancy 09/17/19   Constant, Peggy, MD  cholecalciferol (VITAMIN D3) 25 MCG (1000 UT) tablet Take 1,000 Units by mouth daily.     [provider]  labetalol (NORMODYNE) 200 MG tablet Take 1 tablet (200 mg total) by mouth 2 (two) times daily. 10/15/19   Constant, Peggy, MD  Prenat-FeAsp-Meth-FA-DHA w/o A (PRENATE PIXIE) 10-0.6-0.4-200 MG CAPS Take 1 tablet by mouth daily. 09/17/19   Constant, Peggy, MD    Allergies    Patient has no known allergies.  Review of Systems   Review of Systems  Constitutional: Negative for fever.  Gastrointestinal: Negative for nausea and vomiting.  All other systems reviewed and are negative.   Physical Exam Updated Vital Signs BP (!) 160/73 (BP Location: Right Arm)    Pulse (!) 101    Temp 98 F (36.7 C) (Oral)    Resp 18    Ht 5\' 6"  (1.676 m)    Wt 117.5 kg    LMP 07/03/2019    SpO2 100%    BMI 41.80 kg/m   Physical Exam Vitals and nursing note reviewed.  Constitutional:      General: She is not in acute distress.    Appearance: Normal appearance. She is normal weight.  HENT:     Head: Normocephalic.  Cardiovascular:     Rate and Rhythm: Tachycardia present.     Pulses: Normal pulses.  Pulmonary:     Effort: Pulmonary effort is normal.     Breath sounds: Normal breath sounds.  Musculoskeletal:     Cervical back: Normal range of motion.  Skin:    General: Skin is warm and dry.     Capillary Refill: Capillary refill takes less than 2 seconds.  Neurological:     General: No focal deficit present.     Mental Status: She is alert and oriented to person, place, and time. Mental status is at baseline.  Psychiatric:        Mood and Affect: Mood normal.        Behavior: Behavior normal.        Thought Content: Thought content normal.     ED Results / Procedures / Treatments   Labs (all labs ordered are listed, but only abnormal results are displayed) Labs Reviewed - No data to display  EKG None  Radiology No results found.  Procedures Procedures (including critical care time) INCISION AND DRAINAGE Performed by: Blanchie Dessert Consent: Verbal consent  obtained. Risks and benefits: risks, benefits and alternatives were discussed Type: abscess  Body area: left axilla  Anesthesia: local infiltration  Incision was made with a scalpel.  Local anesthetic: lidocaine 2%  with epinephrine  Anesthetic total: 1 ml  Complexity: complex Blunt dissection to break up loculations  Drainage: purulent  Drainage amount: 50mL  Packing material: 1/4 in iodoform gauze  Patient tolerance: Patient tolerated the procedure well with no immediate complications.     Medications Ordered in ED Medications  lidocaine-EPINEPHrine (XYLOCAINE W/EPI) 2 %-1:200000 (PF) injection 10 mL (has no administration in time range)    ED Course  I have reviewed the triage vital signs and the nursing notes.  Pertinent labs & imaging results that were available during my care of the patient were reviewed by me and considered in my medical decision making (see chart for details).    MDM Rules/Calculators/A&P                      39 year old female who is currently almost [redacted] weeks pregnant presenting today with uncomplicated left axilla abscess.  Patient reports usually they break open and drain on their own with warm compresses but this is day 4 and it is not draining yet.  She is having a significant amount of pain from this.  I&D as above.  No surrounding evidence of cellulitis or requirement for antibiotics at this time.  Also patient is hypertensive here however she has known chronic hypertension with pregnancy.  Patient has not taken 2 doses of her blood pressure medicine yesterday or today yet.  She reports she forgot yesterday.  On her last appointment a week and a half ago blood pressure was 135/80.  Discussed with patient her elevated blood pressure.  She will take her medication when she gets home today.  She has close follow-up with her OB/GYN.  Final Clinical Impression(s) / ED Diagnoses Final diagnoses:  Abscess of axilla, left    Rx / DC Orders ED  Discharge Orders    None       Blanchie Dessert, MD 01/01/20 [phone]

## 2020-01-01 NOTE — ED Triage Notes (Signed)
Patient complains of pain to abscess to left axilla. No drainage just increasing pain

## 2020-01-01 NOTE — Discharge Instructions (Signed)
Continue to use warm soaks on the area.  If the packing is still there in 2 days pull it out.  If it starts feeling worse or getting significant redness or you start running a fever return to the emergency room.  Your blood pressure today was also 160/73.  Make sure you take your medication when you get home and monitor your blood pressure at home.  However if it remains elevated you need to call your OB/GYN.

## 2020-01-08 ENCOUNTER — Other Ambulatory Visit: Payer: Self-pay

## 2020-01-08 MED ORDER — TERCONAZOLE 0.8 % VA CREA: 1.0000 | TOPICAL_CREAM | Freq: Every day | VAGINAL | 0 refills | Status: DC

## 2020-01-08 NOTE — Progress Notes (Signed)
Pt called with symptoms of vaginal yeast infection for the last 2 days. I advised pt I will send her Terazol cream to her pharmacy per protocol. Pt also reports symptoms of UTI, dysuria and foul smell. I advised pt she will need to come leave a urine sample. Pt voices understanding. Call transferred to schedulers to make appt.

## 2020-01-10 ENCOUNTER — Ambulatory Visit (HOSPITAL_BASED_OUTPATIENT_CLINIC_OR_DEPARTMENT_OTHER): Payer: Medicaid Other

## 2020-01-10 ENCOUNTER — Other Ambulatory Visit: Payer: Self-pay

## 2020-01-10 DIAGNOSIS — R1030 Lower abdominal pain, unspecified: Secondary | ICD-10-CM

## 2020-01-10 DIAGNOSIS — R300 Dysuria: Secondary | ICD-10-CM

## 2020-01-10 DIAGNOSIS — R3915 Urgency of urination: Secondary | ICD-10-CM | POA: Diagnosis not present

## 2020-01-10 LAB — POCT URINALYSIS DIPSTICK
Bilirubin, UA: NEGATIVE
Blood, UA: NEGATIVE
Glucose, UA: NEGATIVE
Ketones, UA: NEGATIVE
Leukocytes, UA: NEGATIVE
Nitrite, UA: NEGATIVE
Protein, UA: NEGATIVE
Spec Grav, UA: 1.015 (ref 1.010–1.025)
Urobilinogen, UA: 0.2 E.U./dL
pH, UA: 7 (ref 5.0–8.0)

## 2020-01-10 NOTE — Progress Notes (Signed)
Denise Moses is here for a UA. UA negative.  Pt reports having lower abdominal pain (cramps), urinary urgency/frequency and some dysuria. Urine will be sent for culture. -EH/RMA

## 2020-01-11 ENCOUNTER — Other Ambulatory Visit: Payer: Self-pay | Admitting: *Deleted

## 2020-01-11 ENCOUNTER — Ambulatory Visit: Payer: Medicaid Other | Admitting: *Deleted

## 2020-01-11 ENCOUNTER — Ambulatory Visit: Payer: Medicaid Other | Attending: Obstetrics and Gynecology

## 2020-01-11 DIAGNOSIS — Z148 Genetic carrier of other disease: Secondary | ICD-10-CM

## 2020-01-11 DIAGNOSIS — Z3689 Encounter for other specified antenatal screening: Secondary | ICD-10-CM | POA: Diagnosis not present

## 2020-01-11 DIAGNOSIS — O99352 Diseases of the nervous system complicating pregnancy, second trimester: Secondary | ICD-10-CM | POA: Diagnosis not present

## 2020-01-11 DIAGNOSIS — O99210 Obesity complicating pregnancy, unspecified trimester: Secondary | ICD-10-CM | POA: Diagnosis not present

## 2020-01-11 DIAGNOSIS — G35 Multiple sclerosis: Secondary | ICD-10-CM

## 2020-01-11 DIAGNOSIS — O10012 Pre-existing essential hypertension complicating pregnancy, second trimester: Secondary | ICD-10-CM

## 2020-01-11 DIAGNOSIS — Z3480 Encounter for supervision of other normal pregnancy, unspecified trimester: Secondary | ICD-10-CM | POA: Insufficient documentation

## 2020-01-11 DIAGNOSIS — O09522 Supervision of elderly multigravida, second trimester: Secondary | ICD-10-CM | POA: Insufficient documentation

## 2020-01-11 DIAGNOSIS — Z3A27 27 weeks gestation of pregnancy: Secondary | ICD-10-CM | POA: Diagnosis not present

## 2020-01-11 DIAGNOSIS — O10919 Unspecified pre-existing hypertension complicating pregnancy, unspecified trimester: Secondary | ICD-10-CM | POA: Insufficient documentation

## 2020-01-11 DIAGNOSIS — E669 Obesity, unspecified: Secondary | ICD-10-CM

## 2020-01-11 DIAGNOSIS — Z362 Encounter for other antenatal screening follow-up: Secondary | ICD-10-CM

## 2020-01-11 DIAGNOSIS — O99212 Obesity complicating pregnancy, second trimester: Secondary | ICD-10-CM

## 2020-01-13 LAB — CULTURE, OB URINE

## 2020-01-13 LAB — URINE CULTURE, OB REFLEX

## 2020-01-14 ENCOUNTER — Other Ambulatory Visit: Payer: Medicaid Other

## 2020-01-14 ENCOUNTER — Encounter: Payer: Medicaid Other | Admitting: Obstetrics & Gynecology

## 2020-01-16 ENCOUNTER — Other Ambulatory Visit: Payer: Self-pay

## 2020-01-16 ENCOUNTER — Other Ambulatory Visit: Payer: Medicaid Other

## 2020-01-16 DIAGNOSIS — Z3480 Encounter for supervision of other normal pregnancy, unspecified trimester: Secondary | ICD-10-CM | POA: Diagnosis not present

## 2020-01-17 LAB — CBC
Hematocrit: 32.1 % — ABNORMAL LOW (ref 34.0–46.6)
Hemoglobin: 10.4 g/dL — ABNORMAL LOW (ref 11.1–15.9)
MCH: 27.3 pg (ref 26.6–33.0)
MCHC: 32.4 g/dL (ref 31.5–35.7)
MCV: 84 fL (ref 79–97)
Platelets: 266 10*3/uL (ref 150–450)
RBC: 3.81 x10E6/uL (ref 3.77–5.28)
RDW: 15 % (ref 11.7–15.4)
WBC: 10.1 10*3/uL (ref 3.4–10.8)

## 2020-01-17 LAB — GLUCOSE TOLERANCE, 2 HOURS W/ 1HR
Glucose, 1 hour: 135 mg/dL (ref 65–179)
Glucose, 2 hour: 140 mg/dL (ref 65–152)
Glucose, Fasting: 89 mg/dL (ref 65–91)

## 2020-01-17 LAB — HIV ANTIBODY (ROUTINE TESTING W REFLEX): HIV Screen 4th Generation wRfx: NONREACTIVE

## 2020-01-17 LAB — RPR: RPR Ser Ql: NONREACTIVE

## 2020-01-22 ENCOUNTER — Other Ambulatory Visit: Payer: Self-pay

## 2020-01-22 ENCOUNTER — Ambulatory Visit (INDEPENDENT_AMBULATORY_CARE_PROVIDER_SITE_OTHER): Payer: Medicaid Other | Admitting: Obstetrics and Gynecology

## 2020-01-22 VITALS — BP 143/85 | HR 86 | Wt 262.7 lb

## 2020-01-22 DIAGNOSIS — O10919 Unspecified pre-existing hypertension complicating pregnancy, unspecified trimester: Secondary | ICD-10-CM

## 2020-01-22 DIAGNOSIS — O10913 Unspecified pre-existing hypertension complicating pregnancy, third trimester: Secondary | ICD-10-CM

## 2020-01-22 DIAGNOSIS — Z3A29 29 weeks gestation of pregnancy: Secondary | ICD-10-CM

## 2020-01-22 DIAGNOSIS — O99210 Obesity complicating pregnancy, unspecified trimester: Secondary | ICD-10-CM

## 2020-01-22 DIAGNOSIS — E669 Obesity, unspecified: Secondary | ICD-10-CM

## 2020-01-22 DIAGNOSIS — Z3480 Encounter for supervision of other normal pregnancy, unspecified trimester: Secondary | ICD-10-CM

## 2020-01-22 DIAGNOSIS — O09523 Supervision of elderly multigravida, third trimester: Secondary | ICD-10-CM

## 2020-01-22 NOTE — Progress Notes (Signed)
Pt is here for ROB, [redacted]w[redacted]d.

## 2020-01-22 NOTE — Progress Notes (Signed)
   PRENATAL VISIT NOTE  Subjective:  Denise Moses is a 39 y.o. [id number] at [redacted]w[redacted]d being seen today for ongoing prenatal care.  She is currently monitored for the following issues for this high-risk pregnancy and has Smoker; Recurrent boils; Family history of breast cancer in first degree relative; Herpes simplex antibody positive; Vitamin D deficiency; Iron deficiency anemia; Cervical myelopathy ([place]); Paresthesia; Multiple sclerosis ([place]); GBS bacteriuria; Supervision of other normal pregnancy, antepartum; AMA (advanced maternal age) multigravida 78+; Maternal obesity affecting pregnancy, antepartum; Chronic hypertension affecting pregnancy; and Alpha thalassemia silent carrier on their problem list.  Patient doing well with no acute concerns today. She reports no complaints.  Contractions: Not present. Vag. Bleeding: None.  Movement: Present. Denies leaking of fluid.   Pt denies headache, visual changes and RUQ pain.  BP today 143/85.  She is known CHTN.  Pt notes taking labetalol only once a day usually at night.  Did not take labetalol today.  Pt advised BID dosing is more effective.  The following portions of the patient's history were reviewed and updated as appropriate: allergies, current medications, past family history, past medical history, past social history, past surgical history and problem list. Problem list updated.  Objective:   Vitals:   01/22/20 1439  BP: (!) 143/85  Pulse: 86  Weight: 262 lb 11.2 oz (119.2 kg)    Fetal Status: Fetal Heart Rate (bpm): 141 Fundal Height: 29 cm Movement: Present     General:  Alert, oriented and cooperative. Patient is in no acute distress.  Skin: Skin is warm and dry. No rash noted.   Cardiovascular: Normal heart rate noted  Respiratory: Normal respiratory effort, no problems with respiration noted  Abdomen: Soft, gravid, appropriate for gestational age.  Pain/Pressure: Absent     Pelvic: Cervical exam deferred          Extremities: Normal range of motion.  Edema: Trace  Mental Status:  Normal mood and affect. Normal behavior. Normal judgment and thought content.   Assessment and Plan:  Pregnancy: [id number] at [redacted]w[redacted]d  1. Chronic hypertension affecting pregnancy Advise correct dosing of labetalol, testing starting at 32 weeks, BPP scheduled 02/08/20  2. Supervision of other normal pregnancy, antepartum 28 week labs reviewed, BTL form signed  3. Multigravida of advanced maternal age in third trimester   4. Maternal obesity affecting pregnancy, antepartum   Preterm labor symptoms and general obstetric precautions including but not limited to vaginal bleeding, contractions, leaking of fluid and fetal movement were reviewed in detail with the patient.  Please refer to After Visit Summary for other counseling recommendations.   Return in about 2 weeks (around 02/05/2020) for [facility], in person, check urine protein next visit.   Lynnda Shields, MD

## 2020-02-04 ENCOUNTER — Encounter (HOSPITAL_COMMUNITY): Payer: Self-pay | Admitting: Obstetrics & Gynecology

## 2020-02-04 ENCOUNTER — Inpatient Hospital Stay (HOSPITAL_COMMUNITY)
Admission: AD | Admit: 2020-02-04 | Discharge: 2020-02-04 | Disposition: A | Discharge status: Home / Self Care | Payer: Medicaid Other | Attending: Obstetrics & Gynecology | Admitting: Obstetrics & Gynecology

## 2020-02-04 ENCOUNTER — Other Ambulatory Visit: Payer: Self-pay

## 2020-02-04 DIAGNOSIS — O09523 Supervision of elderly multigravida, third trimester: Secondary | ICD-10-CM

## 2020-02-04 DIAGNOSIS — Z3A30 30 weeks gestation of pregnancy: Secondary | ICD-10-CM | POA: Insufficient documentation

## 2020-02-04 DIAGNOSIS — M545 Low back pain: Secondary | ICD-10-CM | POA: Diagnosis not present

## 2020-02-04 DIAGNOSIS — Z87440 Personal history of urinary (tract) infections: Secondary | ICD-10-CM | POA: Diagnosis not present

## 2020-02-04 DIAGNOSIS — O4703 False labor before 37 completed weeks of gestation, third trimester: Secondary | ICD-10-CM | POA: Diagnosis not present

## 2020-02-04 DIAGNOSIS — O26893 Other specified pregnancy related conditions, third trimester: Secondary | ICD-10-CM | POA: Diagnosis not present

## 2020-02-04 DIAGNOSIS — Z87891 Personal history of nicotine dependence: Secondary | ICD-10-CM | POA: Insufficient documentation

## 2020-02-04 DIAGNOSIS — Z79899 Other long term (current) drug therapy: Secondary | ICD-10-CM | POA: Insufficient documentation

## 2020-02-04 DIAGNOSIS — O98513 Other viral diseases complicating pregnancy, third trimester: Secondary | ICD-10-CM | POA: Diagnosis not present

## 2020-02-04 DIAGNOSIS — R519 Headache, unspecified: Secondary | ICD-10-CM | POA: Diagnosis not present

## 2020-02-04 DIAGNOSIS — O10913 Unspecified pre-existing hypertension complicating pregnancy, third trimester: Secondary | ICD-10-CM | POA: Diagnosis not present

## 2020-02-04 DIAGNOSIS — O479 False labor, unspecified: Secondary | ICD-10-CM

## 2020-02-04 DIAGNOSIS — Z3480 Encounter for supervision of other normal pregnancy, unspecified trimester: Secondary | ICD-10-CM

## 2020-02-04 DIAGNOSIS — Z7982 Long term (current) use of aspirin: Secondary | ICD-10-CM | POA: Insufficient documentation

## 2020-02-04 DIAGNOSIS — O99210 Obesity complicating pregnancy, unspecified trimester: Secondary | ICD-10-CM

## 2020-02-04 DIAGNOSIS — O10919 Unspecified pre-existing hypertension complicating pregnancy, unspecified trimester: Secondary | ICD-10-CM

## 2020-02-04 LAB — URINALYSIS, ROUTINE W REFLEX MICROSCOPIC
Bilirubin Urine: NEGATIVE
Glucose, UA: NEGATIVE mg/dL
Hgb urine dipstick: NEGATIVE
Ketones, ur: 20 mg/dL — AB
Nitrite: NEGATIVE
Protein, ur: 30 mg/dL — AB
Specific Gravity, Urine: 1.026 (ref 1.005–1.030)
pH: 6 (ref 5.0–8.0)

## 2020-02-04 MED ORDER — ACETAMINOPHEN 500 MG PO TABS
1000.0000 mg | ORAL_TABLET | Freq: Once | ORAL | Status: AC
  Administered 2020-02-04: 1000 mg via ORAL
  Filled 2020-02-04: qty 2

## 2020-02-04 MED ORDER — COMFORT FIT MATERNITY SUPP LG MISC
1.0000 [IU] | Freq: Every day | 0 refills | Status: DC
Start: 2020-02-04 — End: 2020-03-26

## 2020-02-04 MED ORDER — LABETALOL HCL 100 MG PO TABS: 200.0000 mg | ORAL_TABLET | Freq: Two times a day (BID) | ORAL | Status: DC

## 2020-02-04 NOTE — Discharge Instructions (Signed)
Braxton Hicks Contractions Contractions of the uterus can occur throughout pregnancy, but they are not always a sign that you are in labor. You may have practice contractions called Braxton Hicks contractions. These false labor contractions are sometimes confused with true labor. What are Braxton Hicks contractions? Braxton Hicks contractions are tightening movements that occur in the muscles of the uterus before labor. Unlike true labor contractions, these contractions do not result in opening (dilation) and thinning of the cervix. Toward the end of pregnancy (32-34 weeks), Braxton Hicks contractions can happen more often and may become stronger. These contractions are sometimes difficult to tell apart from true labor because they can be very uncomfortable. You should not feel embarrassed if you go to the hospital with false labor. Sometimes, the only way to tell if you are in true labor is for your health care provider to look for changes in the cervix. The health care provider will do a physical exam and may monitor your contractions. If you are not in true labor, the exam should show that your cervix is not dilating and your water has not broken. If there are no other health problems associated with your pregnancy, it is completely safe for you to be sent home with false labor. You may continue to have Braxton Hicks contractions until you go into true labor. How to tell the difference between true labor and false labor True labor  Contractions last 30-70 seconds.  Contractions become very regular.  Discomfort is usually felt in the top of the uterus, and it spreads to the lower abdomen and low back.  Contractions do not go away with walking.  Contractions usually become more intense and increase in frequency.  The cervix dilates and gets thinner. False labor  Contractions are usually shorter and not as strong as true labor contractions.  Contractions are usually irregular.  Contractions  are often felt in the front of the lower abdomen and in the groin.  Contractions may go away when you walk around or change positions while lying down.  Contractions get weaker and are shorter-lasting as time goes on.  The cervix usually does not dilate or become thin. Follow these instructions at home:   Take over-the-counter and prescription medicines only as told by your health care provider.  Keep up with your usual exercises and follow other instructions from your health care provider.  Eat and drink lightly if you think you are going into labor.  If Braxton Hicks contractions are making you uncomfortable: ? Change your position from lying down or resting to walking, or change from walking to resting. ? Sit and rest in a tub of warm water. ? Drink enough fluid to keep your urine pale yellow. Dehydration may cause these contractions. ? Do slow and deep breathing several times an hour.  Keep all follow-up prenatal visits as told by your health care provider. This is important. Contact a health care provider if:  You have a fever.  You have continuous pain in your abdomen. Get help right away if:  Your contractions become stronger, more regular, and closer together.  You have fluid leaking or gushing from your vagina.  You pass blood-tinged mucus (bloody show).  You have bleeding from your vagina.  You have low back pain that you never had before.  You feel your baby's head pushing down and causing pelvic pressure.  Your baby is not moving inside you as much as it used to. Summary  Contractions that occur before labor are   called Braxton Hicks contractions, false labor, or practice contractions.  Braxton Hicks contractions are usually shorter, weaker, farther apart, and less regular than true labor contractions. True labor contractions usually become progressively stronger and regular, and they become more frequent.  Manage discomfort from [facility] contractions  by changing position, resting in a warm bath, drinking plenty of water, or practicing deep breathing. This information is not intended to replace advice given to you by your health care provider. Make sure you discuss any questions you have with your health care provider. Document Revised: 07/01/2017 Document Reviewed: 12/02/2016 Elsevier Patient Education  [place].   Back Pain in Pregnancy Back pain during pregnancy is common. Back pain may be caused by several factors that are related to changes during your pregnancy. Follow these instructions at home: Managing pain, stiffness, and swelling      If directed, for sudden (acute) back pain, put ice on the painful area. ? Put ice in a plastic bag. ? Place a towel between your skin and the bag. ? Leave the ice on for 20 minutes, 2-3 times per day.  If directed, apply heat to the affected area before you exercise. Use the heat source that your health care provider recommends, such as a moist heat pack or a heating pad. ? Place a towel between your skin and the heat source. ? Leave the heat on for 20-30 minutes. ? Remove the heat if your skin turns bright red. This is especially important if you are unable to feel pain, heat, or cold. You may have a greater risk of getting burned.  If directed, massage the affected area. Activity  Exercise as told by your health care provider. Gentle exercise is the best way to prevent or manage back pain.  Listen to your body when lifting. If lifting hurts, ask for help or bend your knees. This uses your leg muscles instead of your back muscles.  Squat down when picking up something from the floor. Do not bend over.  Only use bed rest for short periods as told by your health care provider. Bed rest should only be used for the most severe episodes of back pain. Standing, sitting, and lying down  Do not stand in one place for long periods of time.  Use good posture when sitting. Make sure  your head rests over your shoulders and is not hanging forward. Use a pillow on your lower back if necessary.  Try sleeping on your side, preferably the left side, with a pregnancy support pillow or 1-2 regular pillows between your legs. ? If you have back pain after a night's rest, your bed may be too soft. ? A firm mattress may provide more support for your back during pregnancy. General instructions  Do not wear high heels.  Eat a healthy diet. Try to gain weight within your health care provider's recommendations.  Use a maternity girdle, elastic sling, or back brace as told by your health care provider.  Take over-the-counter and prescription medicines only as told by your health care provider.  Work with a physical therapist or massage therapist to find ways to manage back pain. Acupuncture or massage therapy may be helpful.  Keep all follow-up visits as told by your health care provider. This is important. Contact a health care provider if:  Your back pain interferes with your daily activities.  You have increasing pain in other parts of your body. Get help right away if:  You develop numbness, tingling, weakness,  or problems with the use of your arms or legs.  You develop severe back pain that is not controlled with medicine.  You have a change in bowel or bladder control.  You develop shortness of breath, dizziness, or you faint.  You develop nausea, vomiting, or sweating.  You have back pain that is a rhythmic, cramping pain similar to labor pains. Labor pain is usually 1-2 minutes apart, lasts for about 1 minute, and involves a bearing down feeling or pressure in your pelvis.  You have back pain and your water breaks or you have vaginal bleeding.  You have back pain or numbness that travels down your leg.  Your back pain developed after you fell.  You develop pain on one side of your back.  You see blood in your urine.  You develop skin blisters in the area of  your back pain. Summary  Back pain may be caused by several factors that are related to changes during your pregnancy.  Follow instructions as told by your health care provider for managing pain, stiffness, and swelling.  Exercise as told by your health care provider. Gentle exercise is the best way to prevent or manage back pain.  Take over-the-counter and prescription medicines only as told by your health care provider.  Keep all follow-up visits as told by your health care provider. This is important. This information is not intended to replace advice given to you by your health care provider. Make sure you discuss any questions you have with your health care provider. Document Revised: 11/07/2018 Document Reviewed: 01/04/2018 Elsevier Patient Education  [place]: You are not alone, Seventy-five percent of women have some sort of abdominal or back pain at some point in their pregnancy. Your baby is growing at a fast pace, which means that your whole body is rapidly trying to adjust to the changes. As your uterus grows, your back may start feeling a bit under stress and this can result in back or abdominal pain that can go from mild, and therefore bearable, to severe pains that will not allow you to sit or lay down comfortably, When it comes to dealing with pregnancy-related pains and cramps, some pregnant women usually prefer natural remedies, which the market is filled with nowadays. For example, wearing a pregnancy support belt can help ease and lessen your discomfort and pain. WHAT ARE THE BENEFITS OF WEARING A PREGNANCY SUPPORT BELT? A pregnancy support belt provides support to the lower portion of the belly taking some of the weight of the growing uterus and distributing to the other parts of your body. It is designed make you comfortable and gives you extra support. Over the years, the pregnancy apparel market has been studying the needs and wants of  pregnant women and they have come up with the most comfortable pregnancy support belts that woman could ever ask for. In fact, you will no longer have to wear a stretched-out or bulky pregnancy belt that is visible underneath your clothes and makes you feel even more uncomfortable. Nowadays, a pregnancy support belt is made of comfortable and stretchy materials that will not irritate your skin but will actually make you feel at ease and you will not even notice you are wearing it. They are easy to put on and adjust during the day and can be worn at night for additional support.  BENEFITS: . Relives Back pain . Relieves Abdominal Muscle and Leg Pain . Stabilizes the Pelvic  Ring . Offers a Cushioned Abdominal Lift Pad . Relieves pressure on the Sciatic Nerve Within Minutes WHERE TO GET YOUR PREGNANCY BELT: [employer] [phone] @[phone]  [place], [place] [address]

## 2020-02-04 NOTE — MAU Provider Note (Signed)
Chief Complaint:  Contractions   First Provider Initiated Contact with Patient 02/04/20 1549     HPI: Denise Moses is a 39 y.o. [id number] at [redacted]w[redacted]d who presents to maternity admissions reporting low back pain, possible contractions, mild intermittent chest pain, and a headache (7/10). She was out in the heat all day yesterday and has not taken anything for her headache. She denies blurred vision, dizziness, SOB, RUQ pain, vaginal bleeding, leaking of fluid, decreased fetal movement, fever, falls, or recent illness.   Pregnancy Course: Treated for trich, chronic hypertension, hx herpes  Past Medical History:  Diagnosis Date  . Abnormal MRI, cervical spine   . Anemia   . Elective abortion   . Heart murmur   . Herpes   . Infection    UTI  . Pregnancy induced hypertension    2001  . Vitamin D deficiency    OB History  Gravida Para Term Preterm AB Living  12 5 4 1 6 5   SAB TAB Ectopic Multiple Live Births  2 3 1   5     # Outcome Date GA Lbr Len/2nd Weight Sex Delivery Anes PTL Lv  12 Current           11 SAB 07/03/14 [redacted]w[redacted]d   U SAB   FD  10 Term 12/22/06 [redacted]w[redacted]d  6 lb 11 oz (3.033 kg) F Vag-Spont EPI  LIV  9 Term 10/06/05 [redacted]w[redacted]d  8 lb 8 oz (3.856 kg) F Vag-Spont EPI  LIV  8 Preterm 12/17/99 [redacted]w[redacted]d  5 lb 11 oz (2.58 kg) M Vag-Spont None Y LIV  7 Term 11/03/98 [redacted]w[redacted]d  7 lb 14 oz (3.572 kg) M Vag-Spont EPI  LIV  6 TAB           5 TAB           4 TAB           3 Ectopic           2 SAB           1 Term  [redacted]w[redacted]d   M Vag-Spont   LIV     Birth Comments: System Generated. Please review and update pregnancy details.   Past Surgical History:  Procedure Laterality Date  . CHOLECYSTECTOMY    . LAPAROSCOPY N/A 06/09/2016   Procedure: LAPAROSCOPY OPERATIVE;  Surgeon: Osborne Oman, MD;  Location: [facility];  Service: Gynecology;  Laterality: N/A;  ectopic   . THERAPEUTIC ABORTION     x 4  . UNILATERAL SALPINGECTOMY Left 06/09/2016   Procedure: UNILATERAL SALPINGECTOMY, REMOVAL OF IUD;   Surgeon: Osborne Oman, MD;  Location: [facility];  Service: Gynecology;  Laterality: Left;  . WISDOM TOOTH EXTRACTION     Family History  Problem Relation Age of Onset  . Breast cancer Mother 47       was a smoker, alcohol drinker in early life   . Breast cancer Maternal Aunt   . Cancer Maternal Grandfather        unsure of type  . Other Father        unsure of history  . Breast cancer Maternal Aunt    Social History   Tobacco Use  . Smoking status: Former Smoker    Packs/day: 0.25    Years: 10.00    Pack years: 2.50    Types: Cigarettes    Quit date: 03/2019    Years since quitting: 0.9  . Smokeless tobacco: Never Used  . Tobacco comment: 2-3  cigs/day  Vaping Use  . Vaping Use: Never used  Substance Use Topics  . Alcohol use: Not Currently    Comment: social drinker  . Drug use: Yes    Types: Marijuana    Comment: occasional- last use on new years eve   No Known Allergies No medications prior to admission.    I have reviewed patient's Past Medical Hx, Surgical Hx, Family Hx, Social Hx, medications and allergies.   ROS:  Review of Systems  Constitutional: Negative.  Negative for diaphoresis, fatigue and fever.  HENT: Negative for sinus pressure and sinus pain.   Eyes: Negative for photophobia and visual disturbance.  Respiratory: Positive for chest tightness (occasional chest tightness ). Negative for cough and shortness of breath.   Cardiovascular: Positive for chest pain (very mild, intermittent).  Gastrointestinal: Negative for abdominal distention, abdominal pain, constipation, diarrhea and nausea.  Endocrine: Negative.   Genitourinary: Positive for pelvic pain. Negative for vaginal bleeding, vaginal discharge and vaginal pain.  Musculoskeletal: Positive for back pain.  Skin: Negative.   Allergic/Immunologic: Negative.   Neurological: Positive for headaches. Negative for dizziness and syncope.  Hematological: Negative.   Psychiatric/Behavioral: Negative.      Physical Exam   Patient Vitals for the past 24 hrs:  BP Temp Temp src Pulse Resp SpO2 Height Weight  02/04/20 1845 138/74 -- -- -- -- -- -- --  02/04/20 1840 138/74 -- -- 97 -- -- -- --  02/04/20 1837 (!) 153/71 -- -- 96 18 -- -- --  02/04/20 1512 135/72 99 F (37.2 C) Oral 95 18 100 % 5\' 6"  (1.676 m) 258 lb 9.6 oz (117.3 kg)    Constitutional: Well-developed, well-nourished female in no acute distress.  Cardiovascular: normal rate & rhythm, no murmur Respiratory: normal effort, lung sounds clear throughout GI: Abd soft, non-tender, gravid appropriate for gestational age. Pos BS x 4 MS: Extremities nontender, no edema, normal ROM Neurologic: Alert and oriented x 4.  GU:      Pelvic: NEFG, physiologic discharge, no blood, cervix clean. Cervix closed, thick, high.     Fetal Tracing: reactive Baseline: 145 Variability: moderate Accelerations: + Decelerations: none Toco: UI   Labs: Results for orders placed or performed during the hospital encounter of 02/04/20 (from the past 24 hour(s))  Urinalysis, Routine w reflex microscopic     Status: Abnormal   Collection Time: 02/04/20  3:15 PM  Result Value Ref Range   Color, Urine YELLOW YELLOW   APPearance HAZY (A) CLEAR   Specific Gravity, Urine 1.026 1.005 - 1.030   pH 6.0 5.0 - 8.0   Glucose, UA NEGATIVE NEGATIVE mg/dL   Hgb urine dipstick NEGATIVE NEGATIVE   Bilirubin Urine NEGATIVE NEGATIVE   Ketones, ur 20 (A) NEGATIVE mg/dL   Protein, ur 30 (A) NEGATIVE mg/dL   Nitrite NEGATIVE NEGATIVE   Leukocytes,Ua SMALL (A) NEGATIVE   RBC / HPF 0-5 0 - 5 RBC/hpf   WBC, UA 6-10 0 - 5 WBC/hpf   Bacteria, UA FEW (A) NONE SEEN   Squamous Epithelial / LPF 21-50 0 - 5   Mucus PRESENT    Hyaline Casts, UA PRESENT     Imaging:  None  MAU Course: Orders Placed This Encounter  Procedures  . Urinalysis, Routine w reflex microscopic  . Encourage fluids  . ED EKG  . Discharge patient   Meds ordered this encounter  Medications   . acetaminophen (TYLENOL) tablet 1,000 mg  . Elastic Bandages & Supports (COMFORT FIT MATERNITY SUPP  LG) MISC    Sig: 1 Units by Does not apply route daily.    Dispense:  1 each    Refill:  0    Order Specific Question:   Supervising Provider    Answer:   Donnamae Jude [[id number]]  . DISCONTD: labetalol (NORMODYNE) tablet 200 mg   MDM: Tylenol + encourage fluids - headache relieved after Tylenol EKG - abnormal but NSR. I consulted Dr. Harl Bowie (cardio) who said her EKG abnormality is benign.  Assessment: Headache Low back pain Braxton-Hicks contractions  After discharge ordered, BP cycled 153/71. Pt verbalized she had not taken daily dose of labetalol yet. Cuff size changed, BP now 135/72.   Plan: Discharge home in stable condition.  Maternity belt ordered Encouraged pt to take labetalol as prescribed  Follow-up [place]. Go to.   Specialty: Obstetrics and Gynecology Why: as scheduled tomorrow 02/05/20 Contact information: [address] [phone]              Allergies as of 02/04/2020   No Known Allergies     Medication List    TAKE these medications   aspirin EC 81 MG tablet Take 1 tablet (81 mg total) by mouth daily. Take after 12 weeks for prevention of preeclampsia later in pregnancy   cholecalciferol 25 MCG (1000 UNIT) tablet Commonly known as: VITAMIN D3 Take 1,000 Units by mouth daily.   Comfort Fit Maternity Supp Lg Misc 1 Units by Does not apply route daily.   labetalol 200 MG tablet Commonly known as: NORMODYNE Take 1 tablet (200 mg total) by mouth 2 (two) times daily.   Prenate Pixie 10-0.6-0.4-200 MG Caps Take 1 tablet by mouth daily.   terconazole 0.8 % vaginal cream Commonly known as: TERAZOL 3 Place 1 applicator vaginally at bedtime. Apply nightly for three nights.       Gabriel Carina, [place] 02/04/2020 7:04 PM

## 2020-02-04 NOTE — Progress Notes (Signed)
Update given to Candie Chroman CNM, blood pressure cuff changed to appropriate size and blood pressure 138/74

## 2020-02-04 NOTE — MAU Note (Signed)
Didn't really sleep good.  Been contracting all morning long, has a HA (has not taken anything), back hurts and she doesn't feel good.

## 2020-02-05 ENCOUNTER — Encounter: Payer: Self-pay | Admitting: Obstetrics and Gynecology

## 2020-02-05 ENCOUNTER — Ambulatory Visit (INDEPENDENT_AMBULATORY_CARE_PROVIDER_SITE_OTHER): Payer: Medicaid Other | Admitting: Obstetrics and Gynecology

## 2020-02-05 VITALS — BP 129/75 | HR 92 | Wt 259.0 lb

## 2020-02-05 DIAGNOSIS — R8271 Bacteriuria: Secondary | ICD-10-CM

## 2020-02-05 DIAGNOSIS — Z3480 Encounter for supervision of other normal pregnancy, unspecified trimester: Secondary | ICD-10-CM

## 2020-02-05 DIAGNOSIS — O10919 Unspecified pre-existing hypertension complicating pregnancy, unspecified trimester: Secondary | ICD-10-CM

## 2020-02-05 DIAGNOSIS — O99820 Streptococcus B carrier state complicating pregnancy: Secondary | ICD-10-CM

## 2020-02-05 DIAGNOSIS — Z3A31 31 weeks gestation of pregnancy: Secondary | ICD-10-CM

## 2020-02-05 DIAGNOSIS — R894 Abnormal immunological findings in specimens from other organs, systems and tissues: Secondary | ICD-10-CM

## 2020-02-05 DIAGNOSIS — O99213 Obesity complicating pregnancy, third trimester: Secondary | ICD-10-CM

## 2020-02-05 DIAGNOSIS — O10913 Unspecified pre-existing hypertension complicating pregnancy, third trimester: Secondary | ICD-10-CM

## 2020-02-05 DIAGNOSIS — O09523 Supervision of elderly multigravida, third trimester: Secondary | ICD-10-CM

## 2020-02-05 DIAGNOSIS — E669 Obesity, unspecified: Secondary | ICD-10-CM

## 2020-02-05 DIAGNOSIS — O99210 Obesity complicating pregnancy, unspecified trimester: Secondary | ICD-10-CM

## 2020-02-05 NOTE — Progress Notes (Signed)
   PRENATAL VISIT NOTE  Subjective:  Denise Moses is a 39 y.o. [id number] at [redacted]w[redacted]d being seen today for ongoing prenatal care.  She is currently monitored for the following issues for this high-risk pregnancy and has Smoker; Recurrent boils; Family history of breast cancer in first degree relative; Herpes simplex antibody positive; Vitamin D deficiency; Iron deficiency anemia; Cervical myelopathy ([place]); Paresthesia; Multiple sclerosis ([place]); GBS bacteriuria; Supervision of other normal pregnancy, antepartum; AMA (advanced maternal age) multigravida 43+; Maternal obesity affecting pregnancy, antepartum; Chronic hypertension affecting pregnancy; and Alpha thalassemia silent carrier on their problem list.  Patient reports backache.  Contractions: Not present. Vag. Bleeding: None.  Movement: Present. Denies leaking of fluid.   The following portions of the patient's history were reviewed and updated as appropriate: allergies, current medications, past family history, past medical history, past social history, past surgical history and problem list.   Objective:   Vitals:   02/05/20 1446  BP: 129/75  Pulse: 92  Weight: 117.5 kg    Fetal Status: Fetal Heart Rate (bpm): 150 Fundal Height: 33 cm Movement: Present  Presentation: Vertex  General:  Alert, oriented and cooperative. Patient is in no acute distress.  Skin: Skin is warm and dry. No rash noted.   Cardiovascular: Normal heart rate noted  Respiratory: Normal respiratory effort, no problems with respiration noted  Abdomen: Soft, gravid, appropriate for gestational age.  Pain/Pressure: Absent     Pelvic: Cervical exam deferred        Extremities: Normal range of motion.  Edema: Trace  Mental Status: Normal mood and affect. Normal behavior. Normal judgment and thought content.   Assessment and Plan:  Pregnancy: [id number] at [redacted]w[redacted]d 1. Supervision of other normal pregnancy, antepartum Preterm labor precautions given.    2. Chronic  hypertension affecting pregnancy Continue labetalol.   Patient to start antenatal testing at next appointment. Ultrasound scheduled for this Friday.  3. Multigravida of advanced maternal age in third trimester Continue ASA.  4. Maternal obesity affecting pregnancy, antepartum Patient to monitor diet. Walking as exercise encouraged.  5. Herpes simplex antibody positive Suppressive therapy starting at 32 weeks recommended.  6. GBS bacteriuria Anticipate treatment in labor.  Preterm labor symptoms and general obstetric precautions including but not limited to vaginal bleeding, contractions, leaking of fluid and fetal movement were reviewed in detail with the patient. Please refer to After Visit Summary for other counseling recommendations.   Return in about 2 weeks (around 02/19/2020) for HROB with NST.  Future Appointments  Date Time Provider Jeannette  02/08/2020  9:30 AM [facility] NURSE [facility]  02/08/2020  9:30 AM [facility] US[facility]    Cephas Darby, MD

## 2020-02-05 NOTE — Progress Notes (Signed)
Pt presents for routine ROB visit Hospital visit yesterday for ctx's - ctx's subsided Hgb 10.4 two weeks ago

## 2020-02-08 ENCOUNTER — Ambulatory Visit: Payer: Medicaid Other | Admitting: *Deleted

## 2020-02-08 ENCOUNTER — Other Ambulatory Visit: Payer: Self-pay

## 2020-02-08 ENCOUNTER — Other Ambulatory Visit: Payer: Self-pay | Admitting: *Deleted

## 2020-02-08 ENCOUNTER — Ambulatory Visit: Payer: Medicaid Other | Attending: Obstetrics and Gynecology

## 2020-02-08 DIAGNOSIS — O99210 Obesity complicating pregnancy, unspecified trimester: Secondary | ICD-10-CM

## 2020-02-08 DIAGNOSIS — Z3480 Encounter for supervision of other normal pregnancy, unspecified trimester: Secondary | ICD-10-CM | POA: Insufficient documentation

## 2020-02-08 DIAGNOSIS — O99213 Obesity complicating pregnancy, third trimester: Secondary | ICD-10-CM

## 2020-02-08 DIAGNOSIS — O3660X0 Maternal care for excessive fetal growth, unspecified trimester, not applicable or unspecified: Secondary | ICD-10-CM

## 2020-02-08 DIAGNOSIS — O10919 Unspecified pre-existing hypertension complicating pregnancy, unspecified trimester: Secondary | ICD-10-CM

## 2020-02-08 DIAGNOSIS — Z3A31 31 weeks gestation of pregnancy: Secondary | ICD-10-CM

## 2020-02-08 DIAGNOSIS — O09523 Supervision of elderly multigravida, third trimester: Secondary | ICD-10-CM | POA: Insufficient documentation

## 2020-02-08 DIAGNOSIS — G35 Multiple sclerosis: Secondary | ICD-10-CM

## 2020-02-08 DIAGNOSIS — O10013 Pre-existing essential hypertension complicating pregnancy, third trimester: Secondary | ICD-10-CM

## 2020-02-08 DIAGNOSIS — O99353 Diseases of the nervous system complicating pregnancy, third trimester: Secondary | ICD-10-CM

## 2020-02-08 DIAGNOSIS — Z148 Genetic carrier of other disease: Secondary | ICD-10-CM

## 2020-02-08 DIAGNOSIS — E669 Obesity, unspecified: Secondary | ICD-10-CM

## 2020-02-08 DIAGNOSIS — Z362 Encounter for other antenatal screening follow-up: Secondary | ICD-10-CM

## 2020-02-15 ENCOUNTER — Ambulatory Visit: Payer: Medicaid Other | Admitting: *Deleted

## 2020-02-15 ENCOUNTER — Ambulatory Visit: Payer: Medicaid Other | Attending: Obstetrics

## 2020-02-15 ENCOUNTER — Other Ambulatory Visit: Payer: Self-pay

## 2020-02-15 DIAGNOSIS — E669 Obesity, unspecified: Secondary | ICD-10-CM

## 2020-02-15 DIAGNOSIS — O99210 Obesity complicating pregnancy, unspecified trimester: Secondary | ICD-10-CM

## 2020-02-15 DIAGNOSIS — O09523 Supervision of elderly multigravida, third trimester: Secondary | ICD-10-CM

## 2020-02-15 DIAGNOSIS — O99353 Diseases of the nervous system complicating pregnancy, third trimester: Secondary | ICD-10-CM | POA: Diagnosis not present

## 2020-02-15 DIAGNOSIS — O3660X0 Maternal care for excessive fetal growth, unspecified trimester, not applicable or unspecified: Secondary | ICD-10-CM

## 2020-02-15 DIAGNOSIS — Z3A32 32 weeks gestation of pregnancy: Secondary | ICD-10-CM

## 2020-02-15 DIAGNOSIS — G35 Multiple sclerosis: Secondary | ICD-10-CM

## 2020-02-15 DIAGNOSIS — O10919 Unspecified pre-existing hypertension complicating pregnancy, unspecified trimester: Secondary | ICD-10-CM | POA: Insufficient documentation

## 2020-02-15 DIAGNOSIS — O10013 Pre-existing essential hypertension complicating pregnancy, third trimester: Secondary | ICD-10-CM | POA: Diagnosis not present

## 2020-02-15 DIAGNOSIS — Z3480 Encounter for supervision of other normal pregnancy, unspecified trimester: Secondary | ICD-10-CM

## 2020-02-15 DIAGNOSIS — Z148 Genetic carrier of other disease: Secondary | ICD-10-CM

## 2020-02-15 DIAGNOSIS — O99213 Obesity complicating pregnancy, third trimester: Secondary | ICD-10-CM

## 2020-02-15 DIAGNOSIS — Z362 Encounter for other antenatal screening follow-up: Secondary | ICD-10-CM | POA: Diagnosis not present

## 2020-02-19 ENCOUNTER — Ambulatory Visit (INDEPENDENT_AMBULATORY_CARE_PROVIDER_SITE_OTHER): Payer: Medicaid Other | Admitting: Obstetrics

## 2020-02-19 ENCOUNTER — Encounter: Payer: Self-pay | Admitting: Obstetrics

## 2020-02-19 ENCOUNTER — Other Ambulatory Visit: Payer: Self-pay

## 2020-02-19 VITALS — BP 165/85 | HR 85 | Wt 265.0 lb

## 2020-02-19 DIAGNOSIS — E669 Obesity, unspecified: Secondary | ICD-10-CM | POA: Diagnosis not present

## 2020-02-19 DIAGNOSIS — O99820 Streptococcus B carrier state complicating pregnancy: Secondary | ICD-10-CM

## 2020-02-19 DIAGNOSIS — R894 Abnormal immunological findings in specimens from other organs, systems and tissues: Secondary | ICD-10-CM | POA: Diagnosis not present

## 2020-02-19 DIAGNOSIS — O09523 Supervision of elderly multigravida, third trimester: Secondary | ICD-10-CM | POA: Diagnosis not present

## 2020-02-19 DIAGNOSIS — O99213 Obesity complicating pregnancy, third trimester: Secondary | ICD-10-CM | POA: Diagnosis not present

## 2020-02-19 DIAGNOSIS — O10919 Unspecified pre-existing hypertension complicating pregnancy, unspecified trimester: Secondary | ICD-10-CM

## 2020-02-19 DIAGNOSIS — O10913 Unspecified pre-existing hypertension complicating pregnancy, third trimester: Secondary | ICD-10-CM | POA: Diagnosis not present

## 2020-02-19 DIAGNOSIS — Z3A33 33 weeks gestation of pregnancy: Secondary | ICD-10-CM | POA: Diagnosis not present

## 2020-02-19 DIAGNOSIS — R8271 Bacteriuria: Secondary | ICD-10-CM

## 2020-02-19 DIAGNOSIS — O0990 Supervision of high risk pregnancy, unspecified, unspecified trimester: Secondary | ICD-10-CM

## 2020-02-19 DIAGNOSIS — O0993 Supervision of high risk pregnancy, unspecified, third trimester: Secondary | ICD-10-CM | POA: Diagnosis not present

## 2020-02-19 DIAGNOSIS — O99210 Obesity complicating pregnancy, unspecified trimester: Secondary | ICD-10-CM

## 2020-02-19 NOTE — Progress Notes (Signed)
ROB   NST   CC: None

## 2020-02-19 NOTE — Progress Notes (Signed)
Subjective:  Denise Moses is a 39 y.o. [id number] at [redacted]w[redacted]d being seen today for ongoing prenatal care.  She is currently monitored for the following issues for this high-risk pregnancy and has Smoker; Recurrent boils; Family history of breast cancer in first degree relative; Herpes simplex antibody positive; Vitamin D deficiency; Iron deficiency anemia; Cervical myelopathy ([place]); Paresthesia; Multiple sclerosis ([place]); GBS bacteriuria; Supervision of other normal pregnancy, antepartum; AMA (advanced maternal age) multigravida 40+; Maternal obesity affecting pregnancy, antepartum; Chronic hypertension affecting pregnancy; and Alpha thalassemia silent carrier on their problem list.  Patient reports no complaints.  Contractions: Not present. Vag. Bleeding: None.  Movement: Present. Denies leaking of fluid.   The following portions of the patient's history were reviewed and updated as appropriate: allergies, current medications, past family history, past medical history, past social history, past surgical history and problem list. Problem list updated.  Objective:   Vitals:   02/19/20 1443  BP: (!) 165/85  Pulse: 85  Weight: 265 lb (120.2 kg)    Fetal Status: Fetal Heart Rate (bpm): NST    Movement: Present     General:  Alert, oriented and cooperative. Patient is in no acute distress.  Skin: Skin is warm and dry. No rash noted.   Cardiovascular: Normal heart rate noted  Respiratory: Normal respiratory effort, no problems with respiration noted  Abdomen: Soft, gravid, appropriate for gestational age. Pain/Pressure: Absent     Pelvic:  Cervical exam deferred        Extremities: Normal range of motion.  Edema: Trace  Mental Status: Normal mood and affect. Normal behavior. Normal judgment and thought content.   Urinalysis:      Assessment and Plan:  Pregnancy: [id number] at [redacted]w[redacted]d  1. Supervision of high risk pregnancy, antepartum  2. Multigravida of advanced maternal age in third  trimester  3. Chronic hypertension affecting pregnancy Rx: - Fetal nonstress test:  Reactive.  Good variability.  FHR 130-140 bpm.  15x15 accels.  No decels.  4. Maternal obesity affecting pregnancy, antepartum - taking Baby ASA  5. Herpes simplex antibody positive - taking Valtrex for suppression  6. GBS bacteriuria - treat in labor  Preterm labor symptoms and general obstetric precautions including but not limited to vaginal bleeding, contractions, leaking of fluid and fetal movement were reviewed in detail with the patient. Please refer to After Visit Summary for other counseling recommendations.   Return in about 1 week (around 02/26/2020) for [facility] with weekly NST.[name]   Shelly Bombard, MD  02/19/20

## 2020-02-22 ENCOUNTER — Ambulatory Visit: Payer: Medicaid Other | Attending: Obstetrics and Gynecology

## 2020-02-22 ENCOUNTER — Ambulatory Visit: Payer: Medicaid Other | Admitting: *Deleted

## 2020-02-22 ENCOUNTER — Other Ambulatory Visit: Payer: Self-pay

## 2020-02-22 DIAGNOSIS — O3660X0 Maternal care for excessive fetal growth, unspecified trimester, not applicable or unspecified: Secondary | ICD-10-CM

## 2020-02-22 DIAGNOSIS — O10919 Unspecified pre-existing hypertension complicating pregnancy, unspecified trimester: Secondary | ICD-10-CM | POA: Diagnosis present

## 2020-02-22 DIAGNOSIS — E669 Obesity, unspecified: Secondary | ICD-10-CM

## 2020-02-22 DIAGNOSIS — Z3480 Encounter for supervision of other normal pregnancy, unspecified trimester: Secondary | ICD-10-CM | POA: Diagnosis present

## 2020-02-22 DIAGNOSIS — O99210 Obesity complicating pregnancy, unspecified trimester: Secondary | ICD-10-CM | POA: Insufficient documentation

## 2020-02-22 DIAGNOSIS — O99213 Obesity complicating pregnancy, third trimester: Secondary | ICD-10-CM | POA: Diagnosis not present

## 2020-02-22 DIAGNOSIS — O99353 Diseases of the nervous system complicating pregnancy, third trimester: Secondary | ICD-10-CM | POA: Diagnosis not present

## 2020-02-22 DIAGNOSIS — O09523 Supervision of elderly multigravida, third trimester: Secondary | ICD-10-CM

## 2020-02-22 DIAGNOSIS — Z362 Encounter for other antenatal screening follow-up: Secondary | ICD-10-CM

## 2020-02-22 DIAGNOSIS — G35 Multiple sclerosis: Secondary | ICD-10-CM | POA: Diagnosis not present

## 2020-02-22 DIAGNOSIS — O10013 Pre-existing essential hypertension complicating pregnancy, third trimester: Secondary | ICD-10-CM

## 2020-02-22 NOTE — Progress Notes (Signed)
Pt reports "just not feeling good" mild headache, and feeling very tired,"been in the bed for 2 days". Good fetal movement reported.

## 2020-02-27 ENCOUNTER — Ambulatory Visit (INDEPENDENT_AMBULATORY_CARE_PROVIDER_SITE_OTHER): Payer: Medicaid Other | Admitting: Obstetrics & Gynecology

## 2020-02-27 ENCOUNTER — Other Ambulatory Visit: Payer: Self-pay

## 2020-02-27 VITALS — BP 151/86 | HR 94 | Wt 263.0 lb

## 2020-02-27 DIAGNOSIS — Z3A34 34 weeks gestation of pregnancy: Secondary | ICD-10-CM

## 2020-02-27 DIAGNOSIS — Z3480 Encounter for supervision of other normal pregnancy, unspecified trimester: Secondary | ICD-10-CM

## 2020-02-27 DIAGNOSIS — O10913 Unspecified pre-existing hypertension complicating pregnancy, third trimester: Secondary | ICD-10-CM | POA: Diagnosis not present

## 2020-02-27 DIAGNOSIS — O09523 Supervision of elderly multigravida, third trimester: Secondary | ICD-10-CM | POA: Diagnosis not present

## 2020-02-27 DIAGNOSIS — O10919 Unspecified pre-existing hypertension complicating pregnancy, unspecified trimester: Secondary | ICD-10-CM

## 2020-02-27 MED ORDER — LABETALOL HCL 200 MG PO TABS: 400.0000 mg | ORAL_TABLET | Freq: Two times a day (BID) | ORAL | 3 refills | Status: DC

## 2020-02-27 NOTE — Patient Instructions (Signed)

## 2020-02-27 NOTE — Progress Notes (Signed)
   PRENATAL VISIT NOTE  Subjective:  Denise Moses is a 39 y.o. [id number] at [redacted]w[redacted]d being seen today for ongoing prenatal care.  She is currently monitored for the following issues for this high-risk pregnancy and has Smoker; Recurrent boils; Family history of breast cancer in first degree relative; Herpes simplex antibody positive; Vitamin D deficiency; Iron deficiency anemia; Cervical myelopathy ([place]); Paresthesia; Multiple sclerosis ([place]); GBS bacteriuria; Supervision of other normal pregnancy, antepartum; AMA (advanced maternal age) multigravida 64+; Maternal obesity affecting pregnancy, antepartum; Chronic hypertension affecting pregnancy; and Alpha thalassemia silent carrier on their problem list.  Patient reports no complaints.  Contractions: Not present. Vag. Bleeding: None.  Movement: Present. Denies leaking of fluid.   The following portions of the patient's history were reviewed and updated as appropriate: allergies, current medications, past family history, past medical history, past social history, past surgical history and problem list.   Objective:   Vitals:   02/27/20 1452  BP: (!) 151/86  Pulse: 94  Weight: (!) 263 lb (119.3 kg)    Fetal Status: Fetal Heart Rate (bpm): NST    Movement: Present     General:  Alert, oriented and cooperative. Patient is in no acute distress.  Skin: Skin is warm and dry. No rash noted.   Cardiovascular: Normal heart rate noted  Respiratory: Normal respiratory effort, no problems with respiration noted  Abdomen: Soft, gravid, appropriate for gestational age.  Pain/Pressure: Absent     Pelvic: Cervical exam deferred        Extremities: Normal range of motion.     Mental Status: Normal mood and affect. Normal behavior. Normal judgment and thought content.   Assessment and Plan:  Pregnancy: [id number] at [redacted]w[redacted]d 1. Chronic hypertension affecting pregnancy Increase labetalol for control  2. Multigravida of advanced maternal age in third  trimester   3. Supervision of other normal pregnancy, antepartum NST reactive - Fetal nonstress test - labetalol (NORMODYNE) 200 MG tablet; Take 2 tablets (400 mg total) by mouth 2 (two) times daily.  Dispense: 90 tablet; Refill: 3  Preterm labor symptoms and general obstetric precautions including but not limited to vaginal bleeding, contractions, leaking of fluid and fetal movement were reviewed in detail with the patient. Please refer to After Visit Summary for other counseling recommendations.   Return in about 1 week (around 03/05/2020) for NST.  Future Appointments  Date Time Provider [place]  02/29/2020  9:15 AM [facility] NURSE [facility]  02/29/2020  9:30 AM [facility]  03/07/2020  9:15 AM [facility] NURSE [facility]  03/07/2020  9:30 AM [facility]    Emeterio Reeve, MD

## 2020-02-27 NOTE — Progress Notes (Signed)
ROB    CC: NONE  NST today.

## 2020-02-29 ENCOUNTER — Ambulatory Visit: Payer: Medicaid Other | Admitting: *Deleted

## 2020-02-29 ENCOUNTER — Ambulatory Visit: Payer: Medicaid Other | Attending: Obstetrics and Gynecology

## 2020-02-29 ENCOUNTER — Other Ambulatory Visit: Payer: Self-pay

## 2020-02-29 DIAGNOSIS — O10013 Pre-existing essential hypertension complicating pregnancy, third trimester: Secondary | ICD-10-CM | POA: Diagnosis not present

## 2020-02-29 DIAGNOSIS — Z3480 Encounter for supervision of other normal pregnancy, unspecified trimester: Secondary | ICD-10-CM

## 2020-02-29 DIAGNOSIS — O10919 Unspecified pre-existing hypertension complicating pregnancy, unspecified trimester: Secondary | ICD-10-CM | POA: Diagnosis present

## 2020-02-29 DIAGNOSIS — Z3A34 34 weeks gestation of pregnancy: Secondary | ICD-10-CM | POA: Diagnosis not present

## 2020-02-29 DIAGNOSIS — Z148 Genetic carrier of other disease: Secondary | ICD-10-CM | POA: Diagnosis not present

## 2020-02-29 DIAGNOSIS — O99210 Obesity complicating pregnancy, unspecified trimester: Secondary | ICD-10-CM | POA: Insufficient documentation

## 2020-02-29 DIAGNOSIS — O09523 Supervision of elderly multigravida, third trimester: Secondary | ICD-10-CM | POA: Insufficient documentation

## 2020-02-29 DIAGNOSIS — O3663X0 Maternal care for excessive fetal growth, third trimester, not applicable or unspecified: Secondary | ICD-10-CM

## 2020-02-29 DIAGNOSIS — I10 Essential (primary) hypertension: Secondary | ICD-10-CM | POA: Diagnosis not present

## 2020-02-29 DIAGNOSIS — O3660X0 Maternal care for excessive fetal growth, unspecified trimester, not applicable or unspecified: Secondary | ICD-10-CM | POA: Diagnosis present

## 2020-02-29 DIAGNOSIS — G35 Multiple sclerosis: Secondary | ICD-10-CM

## 2020-02-29 DIAGNOSIS — O99350 Diseases of the nervous system complicating pregnancy, unspecified trimester: Secondary | ICD-10-CM

## 2020-03-02 ENCOUNTER — Encounter (HOSPITAL_COMMUNITY): Payer: Self-pay | Admitting: Emergency Medicine

## 2020-03-02 ENCOUNTER — Other Ambulatory Visit: Payer: Self-pay

## 2020-03-02 ENCOUNTER — Emergency Department (HOSPITAL_COMMUNITY)
Admission: EM | Admit: 2020-03-02 | Discharge: 2020-03-02 | Disposition: A | Discharge status: Home / Self Care | Payer: Medicaid Other | Attending: Emergency Medicine | Admitting: Emergency Medicine

## 2020-03-02 DIAGNOSIS — I10 Essential (primary) hypertension: Secondary | ICD-10-CM | POA: Diagnosis not present

## 2020-03-02 DIAGNOSIS — Z87891 Personal history of nicotine dependence: Secondary | ICD-10-CM | POA: Insufficient documentation

## 2020-03-02 DIAGNOSIS — Z3A00 Weeks of gestation of pregnancy not specified: Secondary | ICD-10-CM | POA: Diagnosis not present

## 2020-03-02 DIAGNOSIS — L02411 Cutaneous abscess of right axilla: Secondary | ICD-10-CM | POA: Diagnosis not present

## 2020-03-02 DIAGNOSIS — Z23 Encounter for immunization: Secondary | ICD-10-CM | POA: Diagnosis not present

## 2020-03-02 DIAGNOSIS — O99713 Diseases of the skin and subcutaneous tissue complicating pregnancy, third trimester: Secondary | ICD-10-CM | POA: Diagnosis present

## 2020-03-02 MED ORDER — ACETAMINOPHEN 325 MG PO TABS
650.0000 mg | ORAL_TABLET | Freq: Once | ORAL | Status: AC
  Administered 2020-03-02: 650 mg via ORAL
  Filled 2020-03-02: qty 2

## 2020-03-02 MED ORDER — TETANUS-DIPHTH-ACELL PERTUSSIS 5-2.5-18.5 LF-MCG/0.5 IM SUSP
0.5000 mL | Freq: Once | INTRAMUSCULAR | Status: AC
  Administered 2020-03-02: 0.5 mL via INTRAMUSCULAR
  Filled 2020-03-02: qty 0.5

## 2020-03-02 MED ORDER — AMOXICILLIN-POT CLAVULANATE 875-125 MG PO TABS
1.0000 | ORAL_TABLET | Freq: Two times a day (BID) | ORAL | 0 refills | Status: DC
Start: 2020-03-02 — End: 2020-03-02

## 2020-03-02 MED ORDER — AMOXICILLIN-POT CLAVULANATE 875-125 MG PO TABS
1.0000 | ORAL_TABLET | Freq: Two times a day (BID) | ORAL | 0 refills | Status: DC
Start: 2020-03-02 — End: 2020-03-19

## 2020-03-02 MED ORDER — KETOROLAC TROMETHAMINE 60 MG/2ML IM SOLN: 60.0000 mg | Freq: Once | INTRAMUSCULAR | Status: DC

## 2020-03-02 MED ORDER — LIDOCAINE-EPINEPHRINE (PF) 2 %-1:200000 IJ SOLN
10.0000 mL | Freq: Once | INTRAMUSCULAR | Status: DC
  Filled 2020-03-02 (×2): qty 10

## 2020-03-02 MED ORDER — LIDOCAINE-EPINEPHRINE 1 %-1:100000 IJ SOLN
10.0000 mL | Freq: Once | INTRAMUSCULAR | Status: DC
  Filled 2020-03-02: qty 1

## 2020-03-02 NOTE — ED Notes (Signed)
Pt seen walking from treatment room to [facility] with steady gait.

## 2020-03-02 NOTE — ED Provider Notes (Signed)
[place] EMERGENCY DEPARTMENT Provider Note   CSN: [id number] Arrival date & time: 03/02/20  1201     History Chief Complaint  Patient presents with  . Abscess    Denise Moses is a 39 y.o. female.  HPI 39 year old female with a history of hypertension, alpha thalassemia, MS currently 8 months pregnant presents to the ER with an abscess to her right axilla. Patient states that she was here in June and had the abscess drained, however states that she was not placed on any antibiotics. She states that she feels as though the abscess has returned. Reports pain to the outer part of her underarm. Denies any fevers or chills. No headache, weakness. No chest pain or shortness of breath.    Past Medical History:  Diagnosis Date  . Abnormal MRI, cervical spine   . Anemia   . Elective abortion   . Heart murmur   . Herpes   . Infection    UTI  . Pregnancy induced hypertension    2001  . Vitamin D deficiency     Patient Active Problem List   Diagnosis Date Noted  . Alpha thalassemia silent carrier 12/18/2019  . AMA (advanced maternal age) multigravida 35+ 09/17/2019  . Maternal obesity affecting pregnancy, antepartum 09/17/2019  . Chronic hypertension affecting pregnancy 09/17/2019  . Supervision of other normal pregnancy, antepartum 09/10/2019  . GBS bacteriuria 08/19/2019  . Multiple sclerosis ([place]) 04/30/2019  . Cervical myelopathy ([place]) 04/12/2019  . Paresthesia 04/12/2019  . Vitamin D deficiency 07/05/2017  . Iron deficiency anemia 07/05/2017  . 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 Recurrent boils 04/13/2016  . Family history of breast cancer in first degree relative 04/13/2016  . Smoker 07/03/2014    Past Surgical History:  Procedure Laterality Date  . CHOLECYSTECTOMY    . LAPAROSCOPY N/A 06/09/2016   Procedure: LAPAROSCOPY OPERATIVE;  Surgeon: Osborne Oman, MD;  Location: [facility];  Service: Gynecology;  Laterality: N/A;   ectopic   . THERAPEUTIC ABORTION     x 4  . UNILATERAL SALPINGECTOMY Left 06/09/2016   Procedure: UNILATERAL SALPINGECTOMY, REMOVAL OF IUD;  Surgeon: Osborne Oman, MD;  Location: [facility];  Service: Gynecology;  Laterality: Left;  . WISDOM TOOTH EXTRACTION       OB History    Gravida  12   Para  5   Term  4   Preterm  1   AB  6   Living  5     SAB  2   TAB  3   Ectopic  1   Multiple      Live Births  5           Family History  Problem Relation Age of Onset  . Breast cancer Mother 54       was a smoker, alcohol drinker in early life   . Breast cancer Maternal Aunt   . Cancer Maternal Grandfather        unsure of type  . Other Father        unsure of history  . Breast cancer Maternal Aunt     Social History   Tobacco Use  . Smoking status: Former Smoker    Packs/day: 0.25    Years: 10.00    Pack years: 2.50    Types: Cigarettes    Quit date: 03/2019    Years since quitting: 1.0  . Smokeless tobacco: Never Used  . Tobacco comment: 2-3 cigs/day  Vaping Use  . Vaping Use: Never used  Substance Use Topics  . Alcohol use: Not Currently    Comment: social drinker  . Drug use: Yes    Types: Marijuana    Comment: occasional- last use on new years eve    Home Medications Prior to Admission medications   Medication Sig Start Date End Date Taking? Authorizing Provider  aspirin EC 81 MG tablet Take 1 tablet (81 mg total) by mouth daily. Take after 12 weeks for prevention of preeclampsia later in pregnancy 09/17/19   Constant, Peggy, MD  cholecalciferol (VITAMIN D3) 25 MCG (1000 UT) tablet Take 1,000 Units by mouth daily.    [provider]  Elastic Bandages & Supports (COMFORT FIT MATERNITY SUPP LG) MISC 1 Units by Does not apply route daily. 02/04/20   Gabriel Carina, CNM  labetalol (NORMODYNE) 200 MG tablet Take 2 tablets (400 mg total) by mouth 2 (two) times daily. 02/27/20   Woodroe Mode, MD  Prenat-FeAsp-Meth-FA-DHA w/o A (PRENATE  PIXIE) 10-0.6-0.4-200 MG CAPS Take 1 tablet by mouth daily. 09/17/19   Constant, Peggy, MD    Allergies    Patient has no known allergies.  Review of Systems   Review of Systems  Constitutional: Negative for chills and fever.  Respiratory: Negative for shortness of breath.   Skin: Positive for color change.  Neurological: Negative for headaches.    Physical Exam Updated Vital Signs BP (!) 131/75 (BP Location: Left Arm)   Pulse 98   Temp 98.6 F (37 C) (Oral)   Resp 15   LMP 07/03/2019   SpO2 100%   Physical Exam Vitals and nursing note reviewed.  Constitutional:      General: She is not in acute distress.    Appearance: Normal appearance. She is well-developed.  HENT:     Head: Normocephalic and atraumatic.     Nose: Nose normal.     Mouth/Throat:     Mouth: Mucous membranes are moist.     Pharynx: Oropharynx is clear.  Eyes:     Conjunctiva/sclera: Conjunctivae normal.  Cardiovascular:     Rate and Rhythm: Normal rate and regular rhythm.     Heart sounds: No murmur heard.   Pulmonary:     Effort: Pulmonary effort is normal. No respiratory distress.     Breath sounds: Normal breath sounds.  Abdominal:     Palpations: Abdomen is soft.     Tenderness: There is no abdominal tenderness.  Musculoskeletal:        General: No tenderness. Normal range of motion.     Cervical back: Neck supple.     Right lower leg: No edema.     Left lower leg: No edema.  Skin:    General: Skin is warm and dry.     Findings: Erythema present.     Comments: Left outer axilla with mild erythema, fluctuance. No areas of induration. No visible drainage. No streaking. No evidence of bullae. Large area of scar tissue buildup to the central axilla.  Neurological:     General: No focal deficit present.     Mental Status: She is alert and oriented to person, place, and time.  Psychiatric:        Mood and Affect: Mood normal.        Behavior: Behavior normal.     ED Results / Procedures /  Treatments   Labs (all labs ordered are listed, but only abnormal results are displayed) Labs Reviewed - No data  to display  EKG None  Radiology No results found.  Procedures .Marland KitchenIncision and Drainage  Date/Time: 03/02/2020 6:41 PM Performed by: Garald Balding, PA-C Authorized by: Garald Balding, PA-C   Consent:    Consent obtained:  Verbal   Consent given by:  Patient   Risks discussed:  Bleeding, incomplete drainage, pain and damage to other organs   Alternatives discussed:  No treatment Universal protocol:    Procedure explained and questions answered to patient or proxy's satisfaction: yes     Relevant documents present and verified: yes     Test results available and properly labeled: yes     Imaging studies available: yes     Required blood products, implants, devices, and special equipment available: yes     Site/side marked: yes     Immediately prior to procedure a time out was called: yes     Patient identity confirmed:  Verbally with patient Location:    Type:  Abscess Pre-procedure details:    Skin preparation:  Betadine Anesthesia (see MAR for exact dosages):    Anesthesia method:  Local infiltration   Local anesthetic:  Lidocaine 1% WITH epi Procedure type:    Complexity:  Complex Procedure details:    Incision types:  Single straight   Incision depth:  Subcutaneous   Scalpel blade:  11   Wound management:  Probed and deloculated, irrigated with saline and extensive cleaning   Drainage:  Bloody   Drainage amount:  Scant   Packing materials:  None Post-procedure details:    Patient tolerance of procedure:  Tolerated well, no immediate complications Comments:     Virtually no pus drainage. No indication for packing.   (including critical care time)  Medications Ordered in ED Medications  lidocaine-EPINEPHrine (XYLOCAINE W/EPI) 1 %-1:100000 (with pres) injection 10 mL (has no administration in time range)  Tdap (BOOSTRIX) injection 0.5 mL (0.5 mLs  Intramuscular Given 03/02/20 1757)  acetaminophen (TYLENOL) tablet 650 mg (650 mg Oral Given 03/02/20 1756)    ED Course  I have reviewed the triage vital signs and the nursing notes.  Pertinent labs & imaging results that were available during my care of the patient were reviewed by me and considered in my medical decision making (see chart for details).    MDM Rules/Calculators/A&P                          Patient with right axillary abscess. Attempted to perform I&D, however I was able to get very little pus drainage. Will start on Augmentin given recurrent abscess. Augmentin recommendation per pharmacy.  I will refer her to dermatology as well, given she does get frequent axillary abscesses and may have questionable hydradenitis suppurativa. Encouraged warm compresses. Return precautions discussed. All the patient's questions have been answered to her satisfaction, she voices understanding and is agreeable to the plan. Final Clinical Impression(s) / ED Diagnoses Final diagnoses:  Abscess of axilla, right    Rx / DC Orders ED Discharge Orders         Ordered    amoxicillin-clavulanate (AUGMENTIN) 875-125 MG tablet  Every 12 hours,   Status:  Discontinued     Reprint     03/02/20 1826           Garald Balding, PA-C 03/02/20 1850    Breck Coons, MD 03/02/20 2113

## 2020-03-02 NOTE — Discharge Instructions (Signed)
Please pick up the antibiotic and take as prescribed. Return to the ER if your symptoms worsen. Please call the dermatology number and schedule an appointment.

## 2020-03-02 NOTE — ED Triage Notes (Signed)
Pt c/o of abscess under her right arm.

## 2020-03-03 ENCOUNTER — Telehealth: Payer: Self-pay | Admitting: *Deleted

## 2020-03-03 NOTE — Telephone Encounter (Signed)
Contacted pt to complete t   Transition Care Management Follow-up Telephone Call   Denise Moses Managed Care Transition Call Status:MM Denise Moses Call Made   Date of discharge and from where: [facility], 03/02/20   How have you been since you were released from the hospital? Still in pain   Any questions or concerns? Pt would like to be assigned a PCP  Items Reviewed:  Did the pt receive and understand the discharge instructions provided? No ; reviewed d/c instructions with pt  Medications obtained and verified? No pt did not know she had meds sent to pharmacy  Any new allergies since your discharge? No   Dietary orders reviewed? No  Do you have support at home? Yes family Functional Questionnaire: (I = Independent and D = Dependent)  ADLs: Independent Bathing/Dressing:Independent Meal Prep: Independent Eating: Independent Maintaining continence: Independent Transferring/Ambulation: Independent Managing Meds: Independent Follow up appointments reviewed:  PCP Hospital f/u appt confirmed? No request to be assigned a PCP  [facility] f/u appt confirmed? No pt to contact Denise Moses for follow up   Are transportation arrangements needed? No   If their condition worsens, is the pt aware to call PCP or go to the EmergencyDept.? Yes  Was the patient provided with contact information for the PCP's office or ED? Yes  Was to pt encouraged to call back with questions or concerns? Yes  Pt to be contacted by [name] for assignment of PCP.  Denise Coffin, RN, BSN, [place] Patient [place] [phone]

## 2020-03-06 ENCOUNTER — Encounter: Payer: Medicaid Other | Admitting: Obstetrics & Gynecology

## 2020-03-07 ENCOUNTER — Encounter: Payer: Self-pay | Admitting: *Deleted

## 2020-03-07 ENCOUNTER — Ambulatory Visit: Payer: Medicaid Other | Admitting: *Deleted

## 2020-03-07 ENCOUNTER — Other Ambulatory Visit: Payer: Self-pay | Admitting: *Deleted

## 2020-03-07 ENCOUNTER — Ambulatory Visit: Payer: Medicaid Other | Attending: Obstetrics and Gynecology

## 2020-03-07 ENCOUNTER — Other Ambulatory Visit: Payer: Self-pay

## 2020-03-07 DIAGNOSIS — Z148 Genetic carrier of other disease: Secondary | ICD-10-CM

## 2020-03-07 DIAGNOSIS — O99353 Diseases of the nervous system complicating pregnancy, third trimester: Secondary | ICD-10-CM | POA: Diagnosis not present

## 2020-03-07 DIAGNOSIS — O99213 Obesity complicating pregnancy, third trimester: Secondary | ICD-10-CM

## 2020-03-07 DIAGNOSIS — Z3A35 35 weeks gestation of pregnancy: Secondary | ICD-10-CM

## 2020-03-07 DIAGNOSIS — G35 Multiple sclerosis: Secondary | ICD-10-CM

## 2020-03-07 DIAGNOSIS — O10913 Unspecified pre-existing hypertension complicating pregnancy, third trimester: Secondary | ICD-10-CM

## 2020-03-07 DIAGNOSIS — O10919 Unspecified pre-existing hypertension complicating pregnancy, unspecified trimester: Secondary | ICD-10-CM

## 2020-03-07 DIAGNOSIS — O09523 Supervision of elderly multigravida, third trimester: Secondary | ICD-10-CM

## 2020-03-07 DIAGNOSIS — Z3480 Encounter for supervision of other normal pregnancy, unspecified trimester: Secondary | ICD-10-CM | POA: Diagnosis present

## 2020-03-07 DIAGNOSIS — O99210 Obesity complicating pregnancy, unspecified trimester: Secondary | ICD-10-CM

## 2020-03-07 DIAGNOSIS — O3660X0 Maternal care for excessive fetal growth, unspecified trimester, not applicable or unspecified: Secondary | ICD-10-CM | POA: Diagnosis not present

## 2020-03-07 DIAGNOSIS — E669 Obesity, unspecified: Secondary | ICD-10-CM

## 2020-03-10 ENCOUNTER — Ambulatory Visit (INDEPENDENT_AMBULATORY_CARE_PROVIDER_SITE_OTHER): Payer: Medicaid Other | Admitting: Obstetrics & Gynecology

## 2020-03-10 ENCOUNTER — Other Ambulatory Visit: Payer: Self-pay

## 2020-03-10 VITALS — BP 143/85 | HR 89 | Wt 267.0 lb

## 2020-03-10 DIAGNOSIS — Z3480 Encounter for supervision of other normal pregnancy, unspecified trimester: Secondary | ICD-10-CM

## 2020-03-10 DIAGNOSIS — R8271 Bacteriuria: Secondary | ICD-10-CM

## 2020-03-10 DIAGNOSIS — O10919 Unspecified pre-existing hypertension complicating pregnancy, unspecified trimester: Secondary | ICD-10-CM

## 2020-03-10 DIAGNOSIS — O09523 Supervision of elderly multigravida, third trimester: Secondary | ICD-10-CM

## 2020-03-10 MED ORDER — LABETALOL HCL 200 MG PO TABS: 600.0000 mg | ORAL_TABLET | Freq: Two times a day (BID) | ORAL | 3 refills | Status: DC

## 2020-03-10 NOTE — Progress Notes (Signed)
HOB/NST.  C/o HA 6-7/10, chest tightness x 1+ week. Denies blurry vision

## 2020-03-10 NOTE — Progress Notes (Signed)
° °  PRENATAL VISIT NOTE  Subjective:  Denise Moses is a 39 y.o. [id number] at [redacted]w[redacted]d being seen today for ongoing prenatal care.  She is currently monitored for the following issues for this high-risk pregnancy and has Smoker; Recurrent boils; Family history of breast cancer in first degree relative; Herpes simplex antibody positive; Vitamin D deficiency; Iron deficiency anemia; Cervical myelopathy ([place]); Paresthesia; Multiple sclerosis ([place]); GBS bacteriuria; Supervision of other normal pregnancy, antepartum; AMA (advanced maternal age) multigravida 20+; Maternal obesity affecting pregnancy, antepartum; Chronic hypertension affecting pregnancy; and Alpha thalassemia silent carrier on their problem list.  Patient reports no complaints.  Contractions: Irregular. Vag. Bleeding: None.  Movement: Present. Denies leaking of fluid.   The following portions of the patient's history were reviewed and updated as appropriate: allergies, current medications, past family history, past medical history, past social history, past surgical history and problem list.   Objective:   Vitals:   03/10/20 1400  BP: (!) 143/85  Pulse: 89  Weight: 267 lb (121.1 kg)    Fetal Status: Fetal Heart Rate (bpm): NST   Movement: Present     General:  Alert, oriented and cooperative. Patient is in no acute distress.  Skin: Skin is warm and dry. No rash noted.   Cardiovascular: Normal heart rate noted  Respiratory: Normal respiratory effort, no problems with respiration noted  Abdomen: Soft, gravid, appropriate for gestational age.  Pain/Pressure: Present     Pelvic: Cervical exam deferred        Extremities: Normal range of motion.  Edema: Mild pitting, slight indentation  Mental Status: Normal mood and affect. Normal behavior. Normal judgment and thought content.   Assessment and Plan:  Pregnancy: [id number] at [redacted]w[redacted]d 1. Supervision of other normal pregnancy, antepartum Reactive NST - Fetal nonstress test 2.  Chronic hypertension affecting pregnancy BP needs improvement Labetalol 600 mg BID  3. GBS bacteriuria Treat during labor  4. Multigravida of advanced maternal age in third trimester   Preterm labor symptoms and general obstetric precautions including but not limited to vaginal bleeding, contractions, leaking of fluid and fetal movement were reviewed in detail with the patient. Please refer to After Visit Summary for other counseling recommendations.   Return in about 1 week (around 03/17/2020).  Future Appointments  Date Time Provider [place]  03/14/2020 10:15 AM [facility] NURSE [facility]  03/14/2020 10:30 AM [facility] US3 [facility]  03/21/2020 10:30 AM [facility] NURSE [facility]  03/21/2020 10:45 AM [facility] US6 [facility]    Emeterio Reeve, MD

## 2020-03-10 NOTE — Patient Instructions (Signed)

## 2020-03-14 ENCOUNTER — Encounter: Payer: Self-pay | Admitting: *Deleted

## 2020-03-14 ENCOUNTER — Ambulatory Visit: Payer: Medicaid Other | Attending: Obstetrics and Gynecology

## 2020-03-14 ENCOUNTER — Ambulatory Visit: Payer: Medicaid Other | Admitting: *Deleted

## 2020-03-14 ENCOUNTER — Other Ambulatory Visit: Payer: Self-pay

## 2020-03-14 DIAGNOSIS — O10919 Unspecified pre-existing hypertension complicating pregnancy, unspecified trimester: Secondary | ICD-10-CM | POA: Diagnosis not present

## 2020-03-14 DIAGNOSIS — Z3A36 36 weeks gestation of pregnancy: Secondary | ICD-10-CM | POA: Diagnosis not present

## 2020-03-14 DIAGNOSIS — O99213 Obesity complicating pregnancy, third trimester: Secondary | ICD-10-CM

## 2020-03-14 DIAGNOSIS — O99210 Obesity complicating pregnancy, unspecified trimester: Secondary | ICD-10-CM | POA: Diagnosis present

## 2020-03-14 DIAGNOSIS — Z3480 Encounter for supervision of other normal pregnancy, unspecified trimester: Secondary | ICD-10-CM

## 2020-03-14 DIAGNOSIS — O99353 Diseases of the nervous system complicating pregnancy, third trimester: Secondary | ICD-10-CM

## 2020-03-14 DIAGNOSIS — O09523 Supervision of elderly multigravida, third trimester: Secondary | ICD-10-CM

## 2020-03-14 DIAGNOSIS — E669 Obesity, unspecified: Secondary | ICD-10-CM | POA: Diagnosis not present

## 2020-03-14 DIAGNOSIS — G35 Multiple sclerosis: Secondary | ICD-10-CM

## 2020-03-14 DIAGNOSIS — O10013 Pre-existing essential hypertension complicating pregnancy, third trimester: Secondary | ICD-10-CM | POA: Diagnosis not present

## 2020-03-14 DIAGNOSIS — Z148 Genetic carrier of other disease: Secondary | ICD-10-CM | POA: Diagnosis not present

## 2020-03-14 NOTE — Progress Notes (Signed)
States has had more HA's than usual, but relieved by rest and tylenol. Denies visual changes/sxs and denies RUQ pain.

## 2020-03-18 ENCOUNTER — Encounter: Payer: Medicaid Other | Admitting: Obstetrics and Gynecology

## 2020-03-19 ENCOUNTER — Other Ambulatory Visit: Payer: Self-pay

## 2020-03-19 ENCOUNTER — Encounter (HOSPITAL_COMMUNITY): Payer: Self-pay | Admitting: Obstetrics and Gynecology

## 2020-03-19 ENCOUNTER — Inpatient Hospital Stay (HOSPITAL_COMMUNITY)
Admission: AD | Admit: 2020-03-19 | Discharge: 2020-03-19 | Disposition: A | Discharge status: Home / Self Care | Payer: Medicaid Other | Attending: Obstetrics and Gynecology | Admitting: Obstetrics and Gynecology

## 2020-03-19 DIAGNOSIS — O26893 Other specified pregnancy related conditions, third trimester: Secondary | ICD-10-CM | POA: Insufficient documentation

## 2020-03-19 DIAGNOSIS — O98513 Other viral diseases complicating pregnancy, third trimester: Secondary | ICD-10-CM | POA: Diagnosis not present

## 2020-03-19 DIAGNOSIS — O10913 Unspecified pre-existing hypertension complicating pregnancy, third trimester: Secondary | ICD-10-CM | POA: Diagnosis not present

## 2020-03-19 DIAGNOSIS — R519 Headache, unspecified: Secondary | ICD-10-CM | POA: Diagnosis not present

## 2020-03-19 DIAGNOSIS — R1013 Epigastric pain: Secondary | ICD-10-CM | POA: Diagnosis not present

## 2020-03-19 DIAGNOSIS — Z87891 Personal history of nicotine dependence: Secondary | ICD-10-CM | POA: Insufficient documentation

## 2020-03-19 DIAGNOSIS — Z79899 Other long term (current) drug therapy: Secondary | ICD-10-CM | POA: Diagnosis not present

## 2020-03-19 DIAGNOSIS — Z9049 Acquired absence of other specified parts of digestive tract: Secondary | ICD-10-CM | POA: Diagnosis not present

## 2020-03-19 DIAGNOSIS — O219 Vomiting of pregnancy, unspecified: Secondary | ICD-10-CM | POA: Diagnosis not present

## 2020-03-19 DIAGNOSIS — Z7982 Long term (current) use of aspirin: Secondary | ICD-10-CM | POA: Diagnosis not present

## 2020-03-19 DIAGNOSIS — O212 Late vomiting of pregnancy: Secondary | ICD-10-CM | POA: Insufficient documentation

## 2020-03-19 DIAGNOSIS — Z3A37 37 weeks gestation of pregnancy: Secondary | ICD-10-CM | POA: Insufficient documentation

## 2020-03-19 DIAGNOSIS — Z87440 Personal history of urinary (tract) infections: Secondary | ICD-10-CM | POA: Insufficient documentation

## 2020-03-19 LAB — URINALYSIS, ROUTINE W REFLEX MICROSCOPIC
Bilirubin Urine: NEGATIVE
Glucose, UA: NEGATIVE mg/dL
Hgb urine dipstick: NEGATIVE
Ketones, ur: 20 mg/dL — AB
Nitrite: NEGATIVE
Protein, ur: NEGATIVE mg/dL
Specific Gravity, Urine: 1.02 (ref 1.005–1.030)
pH: 6 (ref 5.0–8.0)

## 2020-03-19 LAB — COMPREHENSIVE METABOLIC PANEL
ALT: 12 U/L (ref 0–44)
AST: 22 U/L (ref 15–41)
Albumin: 3 g/dL — ABNORMAL LOW (ref 3.5–5.0)
Alkaline Phosphatase: 154 U/L — ABNORMAL HIGH (ref 38–126)
Anion gap: 13 (ref 5–15)
BUN: 6 mg/dL (ref 6–20)
CO2: 17 mmol/L — ABNORMAL LOW (ref 22–32)
Calcium: 9.6 mg/dL (ref 8.9–10.3)
Chloride: 104 mmol/L (ref 98–111)
Creatinine, Ser: 0.72 mg/dL (ref 0.44–1.00)
GFR calc Af Amer: >60 mL/min (ref >60)
GFR calc non Af Amer: >60 mL/min (ref >60)
Glucose, Bld: 78 mg/dL (ref 70–99)
Potassium: 4.1 mmol/L (ref 3.5–5.1)
Sodium: 134 mmol/L — ABNORMAL LOW (ref 135–145)
Total Bilirubin: 0.7 mg/dL (ref 0.3–1.2)
Total Protein: 6.9 g/dL (ref 6.5–8.1)

## 2020-03-19 LAB — LIPASE, BLOOD: Lipase: 21 U/L (ref 11–51)

## 2020-03-19 LAB — PROTEIN / CREATININE RATIO, URINE
Creatinine, Urine: 168.52 mg/dL
Protein Creatinine Ratio: 0.15 mg/mg{Cre} (ref 0.00–0.15)
Total Protein, Urine: 25 mg/dL

## 2020-03-19 LAB — CBC
HCT: 34.6 % — ABNORMAL LOW (ref 36.0–46.0)
Hemoglobin: 10.6 g/dL — ABNORMAL LOW (ref 12.0–15.0)
MCH: 25 pg — ABNORMAL LOW (ref 26.0–34.0)
MCHC: 30.6 g/dL (ref 30.0–36.0)
MCV: 81.6 fL (ref 80.0–100.0)
Platelets: 316 10*3/uL (ref 150–400)
RBC: 4.24 MIL/uL (ref 3.87–5.11)
RDW: 17.4 % — ABNORMAL HIGH (ref 11.5–15.5)
WBC: 13.7 10*3/uL — ABNORMAL HIGH (ref 4.0–10.5)
nRBC: 0 % (ref 0.0–0.2)

## 2020-03-19 MED ORDER — ONDANSETRON HCL 4 MG/2ML IJ SOLN
4.0000 mg | Freq: Once | INTRAMUSCULAR | Status: AC
  Administered 2020-03-19: 4 mg via INTRAVENOUS
  Filled 2020-03-19: qty 2

## 2020-03-19 MED ORDER — FAMOTIDINE IN NACL 20-0.9 MG/50ML-% IV SOLN
20.0000 mg | Freq: Once | INTRAVENOUS | Status: AC
  Administered 2020-03-19: 20 mg via INTRAVENOUS
  Filled 2020-03-19: qty 50

## 2020-03-19 MED ORDER — LACTATED RINGERS IV BOLUS
1000.0000 mL | Freq: Once | INTRAVENOUS | Status: AC
  Administered 2020-03-19: 1000 mL via INTRAVENOUS

## 2020-03-19 MED ORDER — FAMOTIDINE 20 MG PO TABS
20.0000 mg | ORAL_TABLET | Freq: Every day | ORAL | 1 refills | Status: DC
Start: 2020-03-19 — End: 2020-03-26

## 2020-03-19 MED ORDER — METOCLOPRAMIDE HCL 5 MG/ML IJ SOLN
10.0000 mg | Freq: Once | INTRAMUSCULAR | Status: AC
  Administered 2020-03-19: 10 mg via INTRAVENOUS
  Filled 2020-03-19: qty 2

## 2020-03-19 MED ORDER — DIPHENHYDRAMINE HCL 50 MG/ML IJ SOLN
25.0000 mg | Freq: Once | INTRAMUSCULAR | Status: AC
  Administered 2020-03-19: 25 mg via INTRAVENOUS
  Filled 2020-03-19: qty 1

## 2020-03-19 MED ORDER — DEXAMETHASONE SODIUM PHOSPHATE 10 MG/ML IJ SOLN
10.0000 mg | Freq: Once | INTRAMUSCULAR | Status: AC
  Administered 2020-03-19: 10 mg via INTRAVENOUS
  Filled 2020-03-19: qty 1

## 2020-03-19 MED ORDER — ONDANSETRON 4 MG PO TBDP: 4.0000 mg | ORAL_TABLET | Freq: Three times a day (TID) | ORAL | 1 refills | Status: DC | PRN

## 2020-03-19 NOTE — MAU Note (Signed)
Contracting since this morning, "close".  Denies bleeding or leaking.  Some pressure. Reports +FM

## 2020-03-19 NOTE — MAU Provider Note (Signed)
History     CSN: [id number]  Arrival date and time: 03/19/20 1403   First Provider Initiated Contact with Patient 03/19/20 1524      Chief Complaint  Patient presents with  . Contractions  . Abdominal Pain  . Nausea   Denise Moses is a 39 y.o. G12P5 at 32w1dwho presents to MAU with complaints of abdominal pain, nausea/vomiting, and headache. Patient reports that she woke up with these symptoms, thought it might be labor but the abdominal pain became worse. She reports abdominal pain "all over belly", upper and lower abdomen, rates pain 6/10. Reports that the pain is more constant than intermittent. Reports that she has been nauseous and emesis x2 occurrences today, "has not been able to keep food down". Last ate a full meal yesterday which was chicken mac and cheese. Patient reports frontal HA, rates pain 7.5/10- has not taken any medication for pain or HA. Patient denies any sick contacts. Denies fever, chills, diarrhea, or urinary symptoms. Denies vaginal bleeding or discharge. +FM.   [facility], patient reports she has not taken Labetalol today.   OB History    Gravida  12   Para  5   Term  4   Preterm  1   AB  6   Living  5     SAB  2   TAB  3   Ectopic  1   Multiple      Live Births  5           Past Medical History:  Diagnosis Date  . Abnormal MRI, cervical spine   . Anemia   . Elective abortion   . Heart murmur   . Herpes   . Infection    UTI  . Pregnancy induced hypertension    2001  . Vitamin D deficiency     Past Surgical History:  Procedure Laterality Date  . CHOLECYSTECTOMY    . LAPAROSCOPY N/A 06/09/2016   Procedure: LAPAROSCOPY OPERATIVE;  Surgeon: UOsborne Oman MD;  [facility];  Service: Gynecology;  Laterality: N/A;  ectopic   . THERAPEUTIC ABORTION     x 4  . UNILATERAL SALPINGECTOMY Left 06/09/2016   Procedure: UNILATERAL SALPINGECTOMY, REMOVAL OF IUD;  Surgeon: UOsborne Oman MD;  [facility];  Service: Gynecology;  Laterality: Left;  . WISDOM TOOTH EXTRACTION      Family History  Problem Relation Age of Onset  . Breast cancer Mother 554      was a smoker, alcohol drinker in early life   . Breast cancer Maternal Aunt   . Cancer Maternal Grandfather        unsure of type  . Other Father        unsure of history  . Breast cancer Maternal Aunt     Social History   Tobacco Use  . Smoking status: Former Smoker    Packs/day: 0.25    Years: 10.00    Pack years: 2.50    Types: Cigarettes    Quit date: 03/2019    Years since quitting: 1.0  . Smokeless tobacco: Never Used  . Tobacco comment: 2-3 cigs/day  Vaping Use  . Vaping Use: Never used  Substance Use Topics  . Alcohol use: Not Currently    Comment: social drinker  . Drug use: Yes    Types: Marijuana    Comment: occasional- last use on new years eve    Allergies: No  Known Allergies  Medications Prior to Admission  Medication Sig Dispense Refill Last Dose  . aspirin EC 81 MG tablet Take 1 tablet (81 mg total) by mouth daily. Take after 12 weeks for prevention of preeclampsia later in pregnancy 300 tablet 2 03/18/2020 at Unknown time  . cholecalciferol (VITAMIN D3) 25 MCG (1000 UT) tablet Take 1,000 Units by mouth daily.   03/18/2020 at Unknown time  . labetalol (NORMODYNE) 200 MG tablet Take 3 tablets (600 mg total) by mouth 2 (two) times daily. 120 tablet 3 03/18/2020 at Unknown time  . Prenat-FeAsp-Meth-FA-DHA w/o A (PRENATE PIXIE) 10-0.6-0.4-200 MG CAPS Take 1 tablet by mouth daily. 30 capsule 9 03/18/2020 at Unknown time  . amoxicillin-clavulanate (AUGMENTIN) 875-125 MG tablet Take 1 tablet by mouth every 12 (twelve) hours. 14 tablet 0   . Elastic Bandages & Supports (COMFORT FIT MATERNITY SUPP LG) MISC 1 Units by Does not apply route daily. 1 each 0     Review of Systems  Constitutional: Negative.  Negative for chills and fever.  Respiratory: Negative for cough and shortness of  breath.   Cardiovascular: Negative.   Gastrointestinal: Positive for abdominal pain, nausea and vomiting. Negative for diarrhea.  Genitourinary: Negative.   Musculoskeletal: Negative.   Neurological: Positive for headaches. Negative for dizziness, syncope and light-headedness.  Psychiatric/Behavioral: Negative.    Physical Exam     Patient Vitals for the past 24 hrs:  BP Temp Temp src Pulse Resp SpO2 Height Weight  03/19/20 1915 (!) 108/54 -- -- 84 -- -- -- --  03/19/20 1900 (!) 112/55 -- -- 88 -- -- -- --  03/19/20 1845 (!) 126/56 -- -- 84 -- -- -- --  03/19/20 1830 (!) 127/56 -- -- 83 -- -- -- --  03/19/20 1815 117/62 -- -- 78 -- -- -- --  03/19/20 1800 (!) 125/58 -- -- 76 -- -- -- --  03/19/20 1745 129/63 -- -- 84 -- -- -- --  03/19/20 1741 133/63 -- -- 77 18 -- -- --  03/19/20 1730 (!) 165/89 -- -- (!) 104 -- -- -- --  03/19/20 1715 (!) 152/82 -- -- 86 -- -- -- --  03/19/20 1700 (!) 143/78 -- -- 91 -- -- -- --  03/19/20 1645 (!) 149/83 -- -- 90 -- -- -- --  03/19/20 1630 138/78 -- -- 89 -- -- -- --  03/19/20 1615 137/78 -- -- 88 -- -- -- --  03/19/20 1545 (!) 147/69 -- -- 84 -- -- -- --  03/19/20 1530 140/71 -- -- 85 -- -- -- --  03/19/20 1517 130/65 -- -- 87 -- -- -- --  03/19/20 1500 -- -- -- -- -- 98 % -- --  03/19/20 1455 -- -- -- -- -- 100 % -- --  03/19/20 1450 -- -- -- -- -- 100 % -- --  03/19/20 1439 (!) 144/70 98.6 F (37 C) Oral 81 18 100 % _0  (1.676 m) 121 kg   Physical Exam Vitals and nursing note reviewed.  HENT:     Head: Normocephalic.  Cardiovascular:     Rate and Rhythm: Normal rate and regular rhythm.  Pulmonary:     Effort: Pulmonary effort is normal. No respiratory distress.     Breath sounds: Normal breath sounds. No wheezing.  Abdominal:     Palpations: Abdomen is soft. There is no mass.     Tenderness: There is no abdominal tenderness. There is no guarding.     Comments: Soft, gravid, no signs  of abruption at this time  Tender in  epigastric region  Neurological:     Mental Status: She is alert and oriented to person, place, and time.  Psychiatric:        Mood and Affect: Mood normal.        Behavior: Behavior normal.        Thought Content: Thought content normal.    Dilation: 1 Effacement (%): 60 Cervical Position: Posterior Station: Ballotable Exam by:: [email or web address]  MAU Course  Procedures  MDM Orders Placed This Encounter  Procedures  . Culture, OB Urine  . CBC  . Comprehensive metabolic panel  . Protein / creatinine ratio, urine  . Urinalysis, Routine w reflex microscopic Urine, Clean Catch  . Lipase, blood  . Measure blood pressure  . Insert peripheral IV   Meds ordered this encounter  Medications  . AND Linked Order Group   . diphenhydrAMINE (BENADRYL) injection 25 mg   . metoCLOPramide (REGLAN) injection 10 mg   . dexamethasone (DECADRON) injection 10 mg  . lactated ringers bolus 1,000 mL  . famotidine (PEPCID) IVPB 20 mg premix  . ondansetron (ZOFRAN) injection 4 mg  . lactated ringers bolus 1,000 mL  . ondansetron (ZOFRAN ODT) 4 MG disintegrating tablet    Sig: Take 1 tablet (4 mg total) by mouth every 8 (eight) hours as needed for nausea or vomiting.    Dispense:  30 tablet    Refill:  1    Order Specific Question:   Supervising Provider    Answer:   ERVIN, MICHAEL L [[id number]]  . famotidine (PEPCID) 20 MG tablet    Sig: Take 1 tablet (20 mg total) by mouth daily.    Dispense:  30 tablet    Refill:  1    Order Specific Question:   Supervising Provider    Answer:   Rip Harbour, MICHAEL L [[id number]]   Initial treatments in MAU included LR bolus and HA cocktail- completed @ 7867 Reassessment @ 6720, patient reports that she is still vomiting. HA is resolved but is still having abdominal pain. Zofran 70m ordered. PCR pending at this time.   Labs reviewed:  Results for orders placed or performed during the hospital encounter of 03/19/20 (from the past 24 hour(s))  CBC     Status: Abnormal    Collection Time: 03/19/20  3:48 PM  Result Value Ref Range   WBC 13.7 (H) 4.0 - 10.5 K/uL   RBC 4.24 3.87 - 5.11 MIL/uL   Hemoglobin 10.6 (L) 12.0 - 15.0 g/dL   HCT 34.6 (L) 36 - 46 %   MCV 81.6 80.0 - 100.0 fL   MCH 25.0 (L) 26.0 - 34.0 pg   MCHC 30.6 30.0 - 36.0 g/dL   RDW 17.4 (H) 11.5 - 15.5 %   Platelets 316 150 - 400 K/uL   nRBC 0.0 0.0 - 0.2 %  Comprehensive metabolic panel     Status: Abnormal   Collection Time: 03/19/20  3:48 PM  Result Value Ref Range   Sodium 134 (L) 135 - 145 mmol/L   Potassium 4.1 3.5 - 5.1 mmol/L   Chloride 104 98 - 111 mmol/L   CO2 17 (L) 22 - 32 mmol/L   Glucose, Bld 78 70 - 99 mg/dL   BUN 6 6 - 20 mg/dL   Creatinine, Ser 0.72 0.44 - 1.00 mg/dL   Calcium 9.6 8.9 - 10.3 mg/dL   Total Protein 6.9 6.5 - 8.1 g/dL   Albumin 3.0 (L) 3.5 -  5.0 g/dL   AST 22 15 - 41 U/L   ALT 12 0 - 44 U/L   Alkaline Phosphatase 154 (H) 38 - 126 U/L   Total Bilirubin 0.7 0.3 - 1.2 mg/dL   GFR calc non Af Amer >60 >60 mL/min   GFR calc Af Amer >60 >60 mL/min   Anion gap 13 5 - 15  Protein / creatinine ratio, urine     Status: None   Collection Time: 03/19/20  3:58 PM  Result Value Ref Range   Creatinine, Urine 168.52 mg/dL   Total Protein, Urine 25 mg/dL   Protein Creatinine Ratio 0.15 0.00 - 0.15 mg/mg[Cre]  Urinalysis, Routine w reflex microscopic Urine, Clean Catch     Status: Abnormal   Collection Time: 03/19/20  3:58 PM  Result Value Ref Range   Color, Urine YELLOW YELLOW   APPearance HAZY (A) CLEAR   Specific Gravity, Urine 1.020 1.005 - 1.030   pH 6.0 5.0 - 8.0   Glucose, UA NEGATIVE NEGATIVE mg/dL   Hgb urine dipstick NEGATIVE NEGATIVE   Bilirubin Urine NEGATIVE NEGATIVE   Ketones, ur 20 (A) NEGATIVE mg/dL   Protein, ur NEGATIVE NEGATIVE mg/dL   Nitrite NEGATIVE NEGATIVE   Leukocytes,Ua TRACE (A) NEGATIVE   RBC / HPF 0-5 0 - 5 RBC/hpf   WBC, UA 0-5 0 - 5 WBC/hpf   Bacteria, UA FEW (A) NONE SEEN   Squamous Epithelial / LPF 11-20 0 - 5   Mucus  PRESENT   Lipase, blood     Status: None   Collection Time: 03/19/20  6:44 PM  Result Value Ref Range   Lipase 21 11 - 51 U/L   Fetal monitoring:  FHR- 135/moderate/+accels/ no decelerations  Toco- irregular   Reassessment of patient- patient reports she feels much better, "about 80% better than when I got here". Patient reports abdominal pain is 1/10. PO challenge tolerated. Educated and discussed use of zofran and pepcid at home. Rx sent to pharmacy of choice.   Discussed reasons to return to MAU. Follow up as scheduled in the office. Return to MAU as needed. Pt stable at time of discharge. Encouraged patient to take Labetalol when she gets home - patient verbalizes understanding.   Assessment and Plan   1. Nausea and vomiting in pregnancy   2. Headache in pregnancy, antepartum, third trimester   3. [redacted] weeks gestation of pregnancy   4. Acute epigastric pain    Discharge home Follow up as scheduled in the office for prenatal care Return to MAU as needed for reasons discussed and/or emergencies  Rx for zofran and pepcid    Follow-up [place] Follow up.   Specialty: Obstetrics and Gynecology Why: Follow up as scheduled for prenatal care and return to MAU as needed Contact information: [address], [place] [phone]             Allergies as of 03/19/2020   No Known Allergies     Medication List    STOP taking these medications   amoxicillin-clavulanate 875-125 MG tablet Commonly known as: AUGMENTIN     TAKE these medications   aspirin EC 81 MG tablet Take 1 tablet (81 mg total) by mouth daily. Take after 12 weeks for prevention of preeclampsia later in pregnancy   cholecalciferol 25 MCG (1000 UNIT) tablet Commonly known as: VITAMIN D3 Take 1,000 Units by mouth daily.   [place]  Supp Lg Misc 1 Units by Does not apply route daily.   famotidine 20 MG  tablet Commonly known as: PEPCID Take 1 tablet (20 mg total) by mouth daily.   labetalol 200 MG tablet Commonly known as: NORMODYNE Take 3 tablets (600 mg total) by mouth 2 (two) times daily.   ondansetron 4 MG disintegrating tablet Commonly known as: Zofran ODT Take 1 tablet (4 mg total) by mouth every 8 (eight) hours as needed for nausea or vomiting.   Prenate Pixie 10-0.6-0.4-200 MG Caps Take 1 tablet by mouth daily.      Lajean Manes CNM  03/19/2020, 7:47 PM

## 2020-03-19 NOTE — MAU Note (Signed)
Assisted pt into gown, sve- not in active labor. EFM applied

## 2020-03-21 ENCOUNTER — Other Ambulatory Visit: Payer: Self-pay | Admitting: Obstetrics and Gynecology

## 2020-03-21 ENCOUNTER — Ambulatory Visit: Payer: Medicaid Other | Admitting: *Deleted

## 2020-03-21 ENCOUNTER — Other Ambulatory Visit: Payer: Self-pay

## 2020-03-21 ENCOUNTER — Inpatient Hospital Stay (HOSPITAL_COMMUNITY)
Admission: AD | Admit: 2020-03-21 | Discharge: 2020-03-26 | DRG: 783 | Disposition: A | Discharge status: Home / Self Care | Payer: Medicaid Other | Attending: Obstetrics and Gynecology | Admitting: Obstetrics and Gynecology

## 2020-03-21 ENCOUNTER — Ambulatory Visit (HOSPITAL_BASED_OUTPATIENT_CLINIC_OR_DEPARTMENT_OTHER): Payer: Medicaid Other

## 2020-03-21 ENCOUNTER — Encounter (HOSPITAL_COMMUNITY): Payer: Self-pay | Admitting: Obstetrics and Gynecology

## 2020-03-21 DIAGNOSIS — Z20822 Contact with and (suspected) exposure to covid-19: Secondary | ICD-10-CM | POA: Diagnosis present

## 2020-03-21 DIAGNOSIS — O99210 Obesity complicating pregnancy, unspecified trimester: Secondary | ICD-10-CM

## 2020-03-21 DIAGNOSIS — Z3A37 37 weeks gestation of pregnancy: Secondary | ICD-10-CM

## 2020-03-21 DIAGNOSIS — O10013 Pre-existing essential hypertension complicating pregnancy, third trimester: Secondary | ICD-10-CM

## 2020-03-21 DIAGNOSIS — O904 Postpartum acute kidney failure: Secondary | ICD-10-CM | POA: Diagnosis not present

## 2020-03-21 DIAGNOSIS — O09523 Supervision of elderly multigravida, third trimester: Secondary | ICD-10-CM | POA: Insufficient documentation

## 2020-03-21 DIAGNOSIS — Z298 Encounter for other specified prophylactic measures: Secondary | ICD-10-CM

## 2020-03-21 DIAGNOSIS — O133 Gestational [pregnancy-induced] hypertension without significant proteinuria, third trimester: Secondary | ICD-10-CM | POA: Diagnosis not present

## 2020-03-21 DIAGNOSIS — O99353 Diseases of the nervous system complicating pregnancy, third trimester: Secondary | ICD-10-CM

## 2020-03-21 DIAGNOSIS — O99214 Obesity complicating childbirth: Secondary | ICD-10-CM | POA: Diagnosis present

## 2020-03-21 DIAGNOSIS — Z3480 Encounter for supervision of other normal pregnancy, unspecified trimester: Secondary | ICD-10-CM

## 2020-03-21 DIAGNOSIS — N179 Acute kidney failure, unspecified: Secondary | ICD-10-CM | POA: Diagnosis not present

## 2020-03-21 DIAGNOSIS — O10919 Unspecified pre-existing hypertension complicating pregnancy, unspecified trimester: Secondary | ICD-10-CM | POA: Diagnosis present

## 2020-03-21 DIAGNOSIS — G35 Multiple sclerosis: Secondary | ICD-10-CM | POA: Diagnosis present

## 2020-03-21 DIAGNOSIS — E669 Obesity, unspecified: Secondary | ICD-10-CM | POA: Diagnosis not present

## 2020-03-21 DIAGNOSIS — Z87891 Personal history of nicotine dependence: Secondary | ICD-10-CM

## 2020-03-21 DIAGNOSIS — D563 Thalassemia minor: Secondary | ICD-10-CM | POA: Diagnosis present

## 2020-03-21 DIAGNOSIS — Z148 Genetic carrier of other disease: Secondary | ICD-10-CM

## 2020-03-21 DIAGNOSIS — O99213 Obesity complicating pregnancy, third trimester: Secondary | ICD-10-CM | POA: Diagnosis not present

## 2020-03-21 DIAGNOSIS — O09529 Supervision of elderly multigravida, unspecified trimester: Secondary | ICD-10-CM

## 2020-03-21 DIAGNOSIS — R894 Abnormal immunological findings in specimens from other organs, systems and tissues: Secondary | ICD-10-CM | POA: Diagnosis present

## 2020-03-21 DIAGNOSIS — O99824 Streptococcus B carrier state complicating childbirth: Secondary | ICD-10-CM | POA: Diagnosis present

## 2020-03-21 DIAGNOSIS — Z302 Encounter for sterilization: Secondary | ICD-10-CM

## 2020-03-21 DIAGNOSIS — O99354 Diseases of the nervous system complicating childbirth: Secondary | ICD-10-CM | POA: Diagnosis present

## 2020-03-21 DIAGNOSIS — R8271 Bacteriuria: Secondary | ICD-10-CM | POA: Diagnosis present

## 2020-03-21 DIAGNOSIS — O1002 Pre-existing essential hypertension complicating childbirth: Principal | ICD-10-CM | POA: Diagnosis present

## 2020-03-21 DIAGNOSIS — Z2989 Encounter for other specified prophylactic measures: Secondary | ICD-10-CM

## 2020-03-21 DIAGNOSIS — Z3A00 Weeks of gestation of pregnancy not specified: Secondary | ICD-10-CM | POA: Diagnosis not present

## 2020-03-21 LAB — CBC
HCT: 30.6 % — ABNORMAL LOW (ref 36.0–46.0)
Hemoglobin: 9.6 g/dL — ABNORMAL LOW (ref 12.0–15.0)
MCH: 25.4 pg — ABNORMAL LOW (ref 26.0–34.0)
MCHC: 31.4 g/dL (ref 30.0–36.0)
MCV: 81 fL (ref 80.0–100.0)
Platelets: 312 10*3/uL (ref 150–400)
RBC: 3.78 MIL/uL — ABNORMAL LOW (ref 3.87–5.11)
RDW: 17.2 % — ABNORMAL HIGH (ref 11.5–15.5)
WBC: 9.6 10*3/uL (ref 4.0–10.5)
nRBC: 0 % (ref 0.0–0.2)

## 2020-03-21 LAB — COMPREHENSIVE METABOLIC PANEL
ALT: 16 U/L (ref 0–44)
AST: 25 U/L (ref 15–41)
Albumin: 2.9 g/dL — ABNORMAL LOW (ref 3.5–5.0)
Alkaline Phosphatase: 145 U/L — ABNORMAL HIGH (ref 38–126)
Anion gap: 10 (ref 5–15)
BUN: 8 mg/dL (ref 6–20)
CO2: 18 mmol/L — ABNORMAL LOW (ref 22–32)
Calcium: 9.4 mg/dL (ref 8.9–10.3)
Chloride: 105 mmol/L (ref 98–111)
Creatinine, Ser: 0.72 mg/dL (ref 0.44–1.00)
GFR calc Af Amer: >60 mL/min (ref >60)
GFR calc non Af Amer: >60 mL/min (ref >60)
Glucose, Bld: 80 mg/dL (ref 70–99)
Potassium: 3.4 mmol/L — ABNORMAL LOW (ref 3.5–5.1)
Sodium: 133 mmol/L — ABNORMAL LOW (ref 135–145)
Total Bilirubin: 0.7 mg/dL (ref 0.3–1.2)
Total Protein: 6.7 g/dL (ref 6.5–8.1)

## 2020-03-21 LAB — CULTURE, OB URINE

## 2020-03-21 LAB — SARS CORONAVIRUS 2 (TAT 6-24 HRS): SARS Coronavirus 2: NEGATIVE

## 2020-03-21 MED ORDER — LACTATED RINGERS IV SOLN: 500.0000 mL | INTRAVENOUS | Status: DC | PRN

## 2020-03-21 MED ORDER — ONDANSETRON HCL 4 MG/2ML IJ SOLN
4.0000 mg | Freq: Four times a day (QID) | INTRAMUSCULAR | Status: DC | PRN
  Administered 2020-03-21 – 2020-03-23 (×3): 4 mg via INTRAVENOUS
  Filled 2020-03-21 (×3): qty 2

## 2020-03-21 MED ORDER — SOD CITRATE-CITRIC ACID 500-334 MG/5ML PO SOLN: 30.0000 mL | ORAL | Status: DC | PRN

## 2020-03-21 MED ORDER — EPHEDRINE 5 MG/ML INJ: 10.0000 mg | INTRAVENOUS | Status: DC | PRN

## 2020-03-21 MED ORDER — LACTATED RINGERS IV SOLN
500.0000 mL | Freq: Once | INTRAVENOUS | Status: AC
  Administered 2020-03-23: 500 mL via INTRAVENOUS

## 2020-03-21 MED ORDER — PHENYLEPHRINE 40 MCG/ML (10ML) SYRINGE FOR IV PUSH (FOR BLOOD PRESSURE SUPPORT): 80.0000 ug | PREFILLED_SYRINGE | INTRAVENOUS | Status: DC | PRN

## 2020-03-21 MED ORDER — FENTANYL-BUPIVACAINE-NACL 0.5-0.125-0.9 MG/250ML-% EP SOLN
12.0000 mL/h | EPIDURAL | Status: DC | PRN
  Administered 2020-03-22: 12 mL/h via EPIDURAL
  Filled 2020-03-21 (×3): qty 250

## 2020-03-21 MED ORDER — LACTATED RINGERS IV SOLN: INTRAVENOUS | Status: DC

## 2020-03-21 MED ORDER — LIDOCAINE HCL (PF) 1 % IJ SOLN: 30.0000 mL | INTRAMUSCULAR | Status: DC | PRN

## 2020-03-21 MED ORDER — DIPHENHYDRAMINE HCL 50 MG/ML IJ SOLN: 12.5000 mg | INTRAMUSCULAR | Status: DC | PRN

## 2020-03-21 MED ORDER — OXYTOCIN-SODIUM CHLORIDE 30-0.9 UT/500ML-% IV SOLN
1.0000 m[IU]/min | INTRAVENOUS | Status: DC
  Administered 2020-03-21 – 2020-03-22 (×2): 2 m[IU]/min via INTRAVENOUS
  Filled 2020-03-21: qty 500

## 2020-03-21 MED ORDER — SODIUM CHLORIDE 0.9 % IV SOLN
5.0000 10*6.[IU] | Freq: Once | INTRAVENOUS | Status: AC
  Administered 2020-03-21: 5 10*6.[IU] via INTRAVENOUS
  Filled 2020-03-21: qty 5

## 2020-03-21 MED ORDER — OXYTOCIN-SODIUM CHLORIDE 30-0.9 UT/500ML-% IV SOLN
2.5000 [IU]/h | INTRAVENOUS | Status: DC
  Administered 2020-03-23: 30 [IU] via INTRAVENOUS
  Filled 2020-03-21: qty 500

## 2020-03-21 MED ORDER — LABETALOL HCL 200 MG PO TABS
600.0000 mg | ORAL_TABLET | Freq: Two times a day (BID) | ORAL | Status: DC
  Administered 2020-03-21: 600 mg via ORAL
  Filled 2020-03-21: qty 3

## 2020-03-21 MED ORDER — FENTANYL CITRATE (PF) 100 MCG/2ML IJ SOLN
50.0000 ug | INTRAMUSCULAR | Status: DC | PRN
  Administered 2020-03-21 – 2020-03-22 (×2): 50 ug via INTRAVENOUS
  Filled 2020-03-21 (×2): qty 2

## 2020-03-21 MED ORDER — OXYTOCIN BOLUS FROM INFUSION: 333.0000 mL | Freq: Once | INTRAVENOUS | Status: DC

## 2020-03-21 MED ORDER — TERBUTALINE SULFATE 1 MG/ML IJ SOLN: 0.2500 mg | Freq: Once | INTRAMUSCULAR | Status: DC | PRN

## 2020-03-21 MED ORDER — PENICILLIN G POT IN DEXTROSE 60000 UNIT/ML IV SOLN
3.0000 10*6.[IU] | INTRAVENOUS | Status: DC
  Administered 2020-03-21 – 2020-03-23 (×7): 3 10*6.[IU] via INTRAVENOUS
  Filled 2020-03-21 (×7): qty 50

## 2020-03-21 MED ORDER — ACETAMINOPHEN 325 MG PO TABS
650.0000 mg | ORAL_TABLET | ORAL | Status: DC | PRN
  Administered 2020-03-21 – 2020-03-22 (×2): 650 mg via ORAL
  Filled 2020-03-21 (×2): qty 2

## 2020-03-21 NOTE — H&P (Addendum)
.. OBSTETRIC ADMISSION HISTORY AND PHYSICAL  Denise Moses is a 39 y.o. female [phone] with IUP at [redacted]w[redacted]d by  presenting for IOL for CHTN with elevated blood pressures, AMA status, and feeling poorly.  Dating by LMP and well supported by [place].   She reports +FMs, No LOF, no VB, no blurry vision, or peripheral edema, and RUQ pain.  Mild headache. She plans on bottle feeding. She requests a BTL for birth control. Forms were signed 6.22.21.  She did have a unilateral salpingectomy in 2017 with an IUD removal. She plans a circumcision.     Previous hx of 5 vaginal deliveries, last was 9 years ago. Proven pelvis to 3856 g  She received her prenatal care at [facility]   Dating: By LMP and supported by [place]  --->  Estimated Date of Delivery: 04/08/20  Sono:  @[redacted]w[redacted]d , CWD, normal anatomy, cephalic presentation,   299 g, >99% EFW   Prenatal History/Complications:  Past Medical History: Past Medical History:  Diagnosis Date  . Abnormal MRI, cervical spine   . Anemia   . Elective abortion   . Heart murmur   . Herpes   . Infection    UTI  . Pregnancy induced hypertension    2001  . Vitamin D deficiency     Past Surgical History: Past Surgical History:  Procedure Laterality Date  . CHOLECYSTECTOMY    . LAPAROSCOPY N/A 06/09/2016   Procedure: LAPAROSCOPY OPERATIVE;  Surgeon: Osborne Oman, MD;  Location: [facility];  Service: Gynecology;  Laterality: N/A;  ectopic   . THERAPEUTIC ABORTION     x 4  . UNILATERAL SALPINGECTOMY Left 06/09/2016   Procedure: UNILATERAL SALPINGECTOMY, REMOVAL OF IUD;  Surgeon: Osborne Oman, MD;  Location: [facility];  Service: Gynecology;  Laterality: Left;  . WISDOM TOOTH EXTRACTION      Obstetrical History: OB History    Gravida  12   Para  5   Term  4   Preterm  1   AB  6   Living  5     SAB  2   TAB  3   Ectopic  1   Multiple      Live Births  5           Social History: Social History   Socioeconomic History  . Marital  status: Single    Spouse name: Not on file  . Number of children: 5  . Years of education: college  . Highest education level: Not on file  Occupational History  . Occupation: Unemployed  Tobacco Use  . Smoking status: Former Smoker    Packs/day: 0.25    Years: 10.00    Pack years: 2.50    Types: Cigarettes    Quit date: 03/2019    Years since quitting: 1.0  . Smokeless tobacco: Never Used  . Tobacco comment: 2-3 cigs/day  Vaping Use  . Vaping Use: Never used  Substance and Sexual Activity  . Alcohol use: Not Currently    Comment: social drinker  . Drug use: Yes    Types: Marijuana    Comment: occasional- last use on new years eve  . Sexual activity: Yes    Birth control/protection: None  Other Topics Concern  . Not on file  Social History Narrative   Lives at home with children.   Right-handed.   No daily caffeine use.   Social Determinants of Health   Financial Resource Strain:   . Difficulty of Paying Living  Expenses: Not on file  Food Insecurity:   . Worried About Charity fundraiser in the Last Year: Not on file  . Ran Out of Food in the Last Year: Not on file  Transportation Needs:   . Lack of Transportation (Medical): Not on file  . Lack of Transportation (Non-Medical): Not on file  Physical Activity:   . Days of Exercise per Week: Not on file  . Minutes of Exercise per Session: Not on file  Stress:   . Feeling of Stress : Not on file  Social Connections:   . Frequency of Communication with Friends and Family: Not on file  . Frequency of Social Gatherings with Friends and Family: Not on file  . Attends Religious Services: Not on file  . Active Member of Clubs or Organizations: Not on file  . Attends Archivist Meetings: Not on file  . Marital Status: Not on file    Family History: Family History  Problem Relation Age of Onset  . Breast cancer Mother 46       was a smoker, alcohol drinker in early life   . Breast cancer Maternal Aunt   .  Cancer Maternal Grandfather        unsure of type  . Other Father        unsure of history  . Breast cancer Maternal Aunt     Allergies: No Known Allergies  Medications Prior to Admission  Medication Sig Dispense Refill Last Dose  . aspirin EC 81 MG tablet Take 1 tablet (81 mg total) by mouth daily. Take after 12 weeks for prevention of preeclampsia later in pregnancy 300 tablet 2   . cholecalciferol (VITAMIN D3) 25 MCG (1000 UT) tablet Take 1,000 Units by mouth daily.     . Elastic Bandages & Supports (COMFORT FIT MATERNITY SUPP LG) MISC 1 Units by Does not apply route daily. 1 each 0   . famotidine (PEPCID) 20 MG tablet Take 1 tablet (20 mg total) by mouth daily. (Patient not taking: Reported on 03/21/2020) 30 tablet 1   . labetalol (NORMODYNE) 200 MG tablet Take 3 tablets (600 mg total) by mouth 2 (two) times daily. 120 tablet 3   . ondansetron (ZOFRAN ODT) 4 MG disintegrating tablet Take 1 tablet (4 mg total) by mouth every 8 (eight) hours as needed for nausea or vomiting. (Patient not taking: Reported on 03/21/2020) 30 tablet 1   . Prenat-FeAsp-Meth-FA-DHA w/o A (PRENATE PIXIE) 10-0.6-0.4-200 MG CAPS Take 1 tablet by mouth daily. 30 capsule 9      Review of Systems   All systems reviewed and negative except as stated in HPI  Blood pressure (!) 158/88, pulse 89, temperature 98.5 F (36.9 C), temperature source Oral, resp. rate 18, height 5\' 7"  (1.702 m), weight 121.8 kg, last menstrual period 07/03/2019. General appearance: alert, appears stated age and mild distress Lungs: clear to auscultation bilaterally Heart: regular rate and rhythm Abdomen: soft, non-tender; bowel sounds normal Extremities: Homans sign is negative, no sign of DVT Presentation: cephalic Fetal monitoringBaseline: 135 bpm, Variability: Good {> 6 bpm) and Accelerations: Reactive Uterine activity: None     Prenatal labs: ABO, Rh: O/Positive/-- (02/15 1416) Antibody: Negative (02/15 1416) Rubella: 1.76  (02/15 1416) RPR: Non Reactive (06/16 0902)  HBsAg: Negative (02/15 1416)  HIV: Non Reactive (06/16 0902)  GBS:   no result yet 2 hr. GTT: 89, 135, 140  Genetic screening: normal  Anatomy US:   Prenatal Transfer Tool  Maternal Diabetes:  No Genetic Screening: Normal Maternal Ultrasounds/Referrals: Normal Fetal Ultrasounds or other Referrals:  None Maternal Substance Abuse:  No Significant Maternal Medications:  Meds include: Other: Labetalol Significant Maternal Lab Results: Group B Strep positive  No results found for this or any previous visit (from the past 24 hour(s)).  Patient Active Problem List   Diagnosis Date Noted  . Alpha thalassemia silent carrier 12/18/2019  . AMA (advanced maternal age) multigravida 35+ 09/17/2019  . Maternal obesity affecting pregnancy, antepartum 09/17/2019  . Chronic hypertension affecting pregnancy 09/17/2019  . Supervision of other normal pregnancy, antepartum 09/10/2019  . GBS bacteriuria 08/19/2019  . Multiple sclerosis ([place]) 04/30/2019  . Cervical myelopathy ([place]) 04/12/2019  . Paresthesia 04/12/2019  . Vitamin D deficiency 07/05/2017  . Iron deficiency anemia 07/05/2017  . 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 Recurrent boils 04/13/2016  . Family history of breast cancer in first degree relative 04/13/2016  . Smoker 07/03/2014    Assessment/Plan:  Quantisha Marsicano is a 39 y.o. [id number] at [redacted]w[redacted]d here for IOL due to [facility] with some elevated pressures, feeling unwell, and AMA status   #Labor: latent  #Pain: IV pain  #FWB: continuous fetal monitoring  #MOF: bottle #MOC: BTL #Circ: planning   Anticipate VD Continue BP medication   1. Hypertension - Chronic/Pre-existing             2. Advanced maternal age multigravida 55+,          third trimester 3. Obesity complicating pregnancy, third           trimester (pregravid BMI 34) 4. Multiple sclerosis affecting pregnancy        5. Low risk NIPS, 6.6FF 6. Genetic  carrier (silent alpha thal)                               Tonna Corner, Student-MidWife  03/21/2020, 4:27 PM  CNM attestation:  I have seen and examined this patient; I agree with above documentation in the student nurse midwife's note.   Delight Bickle is a 39 y.o. [id number] here for IOL due to cHTN, poor control  PE: BP 128/70   Pulse 79   Temp 97.6 F (36.4 C) (Axillary)   Resp 18   Ht 5\' 7"  (1.702 m)   Wt 121.8 kg   LMP 07/03/2019   BMI 42.07 kg/m  Gen: calm comfortable, NAD Resp: normal effort, no distress Abd: gravid  ROS, labs, PMH reviewed  Plan: Admit to Labor and Delivery Continue Labetalol 600mg  bid Plan Pitocin as cervix is already favorable Collect pre-e labs/watch for s/s of SIPE Anticipate vag del  Myrtis Ser CNM 03/21/2020, 10:48 PM

## 2020-03-22 ENCOUNTER — Inpatient Hospital Stay (HOSPITAL_COMMUNITY): Payer: Medicaid Other | Admitting: Anesthesiology

## 2020-03-22 ENCOUNTER — Encounter (HOSPITAL_COMMUNITY): Payer: Self-pay | Admitting: Obstetrics and Gynecology

## 2020-03-22 LAB — PROTEIN / CREATININE RATIO, URINE
Creatinine, Urine: 170.31 mg/dL
Protein Creatinine Ratio: 0.1 mg/mg{Cre} (ref 0.00–0.15)
Total Protein, Urine: 17 mg/dL

## 2020-03-22 LAB — CBC
HCT: 28.7 % — ABNORMAL LOW (ref 36.0–46.0)
Hemoglobin: 8.9 g/dL — ABNORMAL LOW (ref 12.0–15.0)
MCH: 24.7 pg — ABNORMAL LOW (ref 26.0–34.0)
MCHC: 31 g/dL (ref 30.0–36.0)
MCV: 79.7 fL — ABNORMAL LOW (ref 80.0–100.0)
Platelets: 292 10*3/uL (ref 150–400)
RBC: 3.6 MIL/uL — ABNORMAL LOW (ref 3.87–5.11)
RDW: 17.2 % — ABNORMAL HIGH (ref 11.5–15.5)
WBC: 9.1 10*3/uL (ref 4.0–10.5)
nRBC: 0 % (ref 0.0–0.2)

## 2020-03-22 MED ORDER — LIDOCAINE HCL (PF) 1 % IJ SOLN
INTRAMUSCULAR | Status: DC | PRN
  Administered 2020-03-22: 10 mL via EPIDURAL

## 2020-03-22 MED ORDER — LACTATED RINGERS AMNIOINFUSION: INTRAVENOUS | Status: DC

## 2020-03-22 MED ORDER — LABETALOL HCL 200 MG PO TABS
300.0000 mg | ORAL_TABLET | Freq: Two times a day (BID) | ORAL | Status: DC
  Administered 2020-03-22: 300 mg via ORAL
  Filled 2020-03-22: qty 1

## 2020-03-22 MED ORDER — BUPIVACAINE HCL (PF) 0.75 % IJ SOLN
INTRAMUSCULAR | Status: DC | PRN
Start: 2020-03-22 — End: 2020-03-23
  Administered 2020-03-22: 12 mL/h via EPIDURAL

## 2020-03-22 NOTE — Progress Notes (Addendum)
Labor Progress Note Denise Moses is a 39 y.o. [id number] at [redacted]w[redacted]d presented for IOL due to cHTN with elevated BP's S: Patient tells me she just received pain medications which have helped. She is trying to rest when she can. Feeling contractions every 3-4 minutes.  O:  BP 135/66   Pulse 76   Temp 97.8 F (36.6 C) (Oral)   Resp 18   Ht 5\' 7"  (1.702 m)   Wt 121.8 kg   LMP 07/03/2019   BMI 42.07 kg/m  EFM: baseline 135/moderate variability/no accels or decels  CVE: Dilation: 6 Effacement (%): 70 Cervical Position: Middle Station: -2 Presentation: Vertex Exam by:: Delorise Shiner, RN   A&P: 39 y.o. [id number] [redacted]w[redacted]d IOL for cHTN #Labor: Pit restarted at 1115, currently at 45mL/hr. IUPC in place. MVU 120 #Pain: Epidural  #FWB: Cat I #GBS positive, on PCN CHTN:  Labetalol 600mg  BID. Last dose yesterday at 1619, held this AM due to soft pressures- 107/49. Most recent BP 135/66, can consider restarting.  [name], DO 12:59 PM  Attestation of Supervision of Student:  I confirm that I have verified the information documented in the  resident's  note and that I have also personally reperformed the history, physical exam and all medical decision making activities.  I have verified that all services and findings are accurately documented in this student's note; and I agree with management and plan as outlined in the documentation. I have also made any necessary editorial changes.  [name], [place] for [facility], [place] [facility] 03/22/2020 3:32 PM

## 2020-03-22 NOTE — Anesthesia Preprocedure Evaluation (Signed)
Anesthesia Evaluation  Patient identified by MRN, date of birth, ID band Patient awake    Reviewed: Allergy & Precautions, H&P , NPO status , Patient's Chart, lab work & pertinent test results  History of Anesthesia Complications Negative for: history of anesthetic complications  Airway Mallampati: II  TM Distance: >3 FB Neck ROM: full    Dental no notable dental hx.    Pulmonary neg pulmonary ROS, former smoker,    Pulmonary exam normal        Cardiovascular hypertension, Normal cardiovascular exam Rhythm:regular Rate:Normal     Neuro/Psych MS negative psych ROS   GI/Hepatic negative GI ROS, Neg liver ROS,   Endo/Other  Morbid obesity  Renal/GU      Musculoskeletal   Abdominal   Peds  Hematology  (+) Blood dyscrasia, anemia ,   Anesthesia Other Findings   Reproductive/Obstetrics (+) Pregnancy                             Anesthesia Physical Anesthesia Plan  ASA: III  Anesthesia Plan: Epidural   Post-op Pain Management:    Induction:   PONV Risk Score and Plan:   Airway Management Planned:   Additional Equipment:   Intra-op Plan:   Post-operative Plan:   Informed Consent: I have reviewed the patients History and Physical, chart, labs and discussed the procedure including the risks, benefits and alternatives for the proposed anesthesia with the patient or authorized representative who has indicated his/her understanding and acceptance.       Plan Discussed with:   Anesthesia Plan Comments:         Anesthesia Quick Evaluation

## 2020-03-22 NOTE — Progress Notes (Signed)
.  Labor Progress Note Denise Moses is a 39 y.o. [id number] at [redacted]w[redacted]d presented for IOL due to [facility] with elevated  blood pressures, AMA status, and feeling poorly  S: 0500: Went in to check on pt at 0500 and found her sleeping soundly.        0530: Checked back at 0530 and she continued to sleep.  Requested nurses to go up on Pitocin per protocol and made plan to check cervix and present IUPC option to patient at 0600      0600: Denise Moses stirring in the bed.  Discussed introducing an IUPC, how it works, and rationale to pt. She would like to try it. We talked through her questions.   She also reports that she has a headache and would like something for the pain.   Is glad she was able to get some rest.  We discussed her BP and how the epidural has affected them.  She is engaged in her care and asks good questions    O:  BP (!) 121/56   Pulse 82   Temp 97.6 F (36.4 C) (Axillary)   Resp 16   Ht 5\' 7"  (1.702 m)   Wt 121.8 kg   LMP 07/03/2019   BMI 42.07 kg/m  EFM: 135/less than 6/no decels  CVE: Dilation: 3.5 Effacement (%): 60 Cervical Position: Posterior Station: -2 Presentation: Vertex Exam by:: K. Gwynneth Munson SCNM Head has descended some, cervix is slightly off to maternal left   IUPC inserted, well tolerated, contractions are being picked up immediately   A&P: 39 y.o. [id number] [redacted]w[redacted]d IOL due to [facility] with elevated blood pressures, AMA status, and feeling poorly  #Labor: Progressing #Pain: managed with epidural, will give Tylenol for headache #FWB: continuous fetal monitoring and IUPC  #GBS being treated    Tonna Corner, Student-MidWife 6:39 AM

## 2020-03-22 NOTE — Anesthesia Procedure Notes (Signed)
Epidural Patient location during procedure: OB Start time: 03/22/2020 1:29 AM End time: 03/22/2020 1:40 AM  Staffing Anesthesiologist: Lidia Collum, MD Performed: anesthesiologist   Preanesthetic Checklist Completed: patient identified, IV checked, risks and benefits discussed, monitors and equipment checked, pre-op evaluation and timeout performed  Epidural Patient position: sitting Prep: DuraPrep Patient monitoring: heart rate, continuous pulse ox and blood pressure Approach: midline Location: L3-L4 Injection technique: LOR air  Needle:  Needle type: Tuohy  Needle gauge: 17 G Needle length: 9 cm Needle insertion depth: 7 cm Catheter type: closed end flexible Catheter size: 19 Gauge Catheter at skin depth: 12 cm Test dose: negative  Assessment Events: blood not aspirated, injection not painful, no injection resistance, no paresthesia and negative IV test  Additional Notes Reason for block:procedure for pain

## 2020-03-22 NOTE — Progress Notes (Signed)
Labor Progress Note Angeles Zehner is a 39 y.o. [id number] at [redacted]w[redacted]d presented for IOL due to [facility] with elevated blood pressures, AMA status, and feeling poorly   S: After IUPC, contraction pattern revealed itself to be consistent      Began having some variables and cut Pitocin in half, to 8     When holding monitor, decels were not as pronounced but still present  so that an amniofusion     was started      [place] reports that her headache is feeling better now   O:  BP (!) 103/51   Pulse 79   Temp 97.6 F (36.4 C) (Axillary)   Resp 18   Ht 5\' 7"  (1.702 m)   Wt 121.8 kg   LMP 07/03/2019   BMI 42.07 kg/m  EFM: 130s/mod/variables   Palpated a large fetal movement after which she had a more pronounced decel which resolved with position change Cervical check was done to see if rapid change was being made, which it had    CVE: Dilation: 5.5 Effacement (%): 80 Cervical Position: Middle Station: -1 Presentation: Vertex Exam by:: Delorise Shiner, RN    A&P: 39 y.o. [id number] [redacted]w[redacted]d presented for IOL due to [facility] with elevated blood pressures, AMA status, and feeling poorly  #Labor: Progressing #Pain: managed with epidural  #FWB: continuous fetal monitoring  #GBS being treated    Tonna Corner, Student-MidWife 8:04 AM

## 2020-03-22 NOTE — Progress Notes (Signed)
Labor Progress Note Denise Moses is a 39 y.o. [id number] at [redacted]w[redacted]d presented for IOL for CHTN with elevated blood pressures, AMA status, and feeling poorly.  Dating by LMP and well supported by [place]   S: [name] resting comfortably.  Discussed whether or not she would like to have her water broken and she affirms she would like to do that. No questions or concerns   O:  BP (!) 150/49   Pulse 74   Temp 97.6 F (36.4 C) (Axillary)   Resp 18   Ht 5\' 7"  (1.702 m)   Wt 121.8 kg   LMP 07/03/2019   BMI 42.07 kg/m  EFM: 140/mod/accels present/decels absent  CVE: Dilation: 3 Effacement (%): 50 Station: -3 Presentation: Vertex Exam by:: Derrill Memo, CNM   AROM completed    A&P:  39 y.o. [name] [redacted]w[redacted]d  #Labor: Progressing well.  #Pain: IV pain medication  #FWB: continuous fetal monitoring  #GBS being treated   Anticipate VD   Tonna Corner, Student-MidWife 12:10 AM

## 2020-03-22 NOTE — Progress Notes (Addendum)
Labor Progress Note Denise Moses is a 39 y.o. [id number] at [redacted]w[redacted]d presented for IOL for cHTN S: Feels tired  O:  BP 124/80   Pulse 82   Temp 98.3 F (36.8 C) (Oral)   Resp 18   Ht 5\' 7"  (1.702 m)   Wt 121.8 kg   LMP 07/03/2019   BMI 42.07 kg/m  EFM: baseline 135/moderate variability/+accels, no decels  CVE: Dilation: 6 Effacement (%): 70, 80 Cervical Position: Anterior Station: -1 Presentation: Vertex Exam by:: Delorise Shiner, RN    A&P: 39 y.o. [id number] [redacted]w[redacted]d IOL for cHTN #Labor: not making significant cervical change however, reassuring that FHT has good accels, no decels and moderate variability. On Pitocin at 23mL/hr since @1639 . Bedside U/S revealed OP position. Patient on peanut ball, switched to "reverse cowgirl" position in hopes that baby can turn to OA position. Will continue to assess but limit cervical checks as possible since membranes ruptured. #Pain: Epidural #FWB: Cat I #GBS positive #cHTN: BP stable, most recent 124/80.   [name], DO 7:01 PM  Attestation of Supervision of Student:  I confirm that I have verified the information documented in the  resident's  note and that I have also personally reperformed the history, physical exam and all medical decision making activities.  I have verified that all services and findings are accurately documented in this student's note; and I agree with management and plan as outlined in the documentation. I have also made any necessary editorial changes.  [name], [place] for [facility], [facility] 03/22/2020 9:00 PM

## 2020-03-22 NOTE — Progress Notes (Addendum)
Labor Progress Note Denise Moses is a 39 y.o. [id number] at [redacted]w[redacted]d presented for IOL for cHTN with elevated blood pressures S: Feeling well. S/p Epidural  O:  BP 128/67   Pulse 83   Temp 98.1 F (36.7 C) (Axillary)   Resp 18   Ht 5\' 7"  (1.702 m)   Wt 121.8 kg   LMP 07/03/2019   BMI 42.07 kg/m  EFM: baseline 140/moderate variability/+accels, no decels  CVE: Dilation: 6 Effacement (%): 70, 80 Cervical Position: Middle Station: -1 Presentation: Vertex Exam by:: Dr. Lenoard Aden, RN   A&P: 39 y.o. [id number] [redacted]w[redacted]d IOL for cHTN #Labor: s/p Epidural. Pitocin at 22mL/hr since 1555. Plan to continue to increase by 24mL every 2 hours. Continue to monitor progression with next cervical check in 2 hours since patient now in active labor. #Pain: Epidural #FWB: Cat I #GBS positive on PCN #gHTN: BP's stable, most recent 128/67. Labetalol held this morning. Has only received one dose thus far which was yesterday afternoon. Will continue to monitor for now.   [name], DO 4:35 PM  Attestation of Supervision of Student:  I confirm that I have verified the information documented in the  resident's  note and that I have also personally reperformed the history, physical exam and all medical decision making activities.  I have verified that all services and findings are accurately documented in this student's note; and I agree with management and plan as outlined in the documentation. I have also made any necessary editorial changes.  [name], [place] for [facility], [facility] 03/22/2020 8:58 PM

## 2020-03-22 NOTE — Progress Notes (Signed)
Labor Progress Note Lorely Bubb is a 39 y.o. [id number] at [redacted]w[redacted]d presented for IOL for cHTN S: Feels tired, had been vomiting earlier in night. Says she had food poisoning a few days ago and hasn't fullry recovered.   O:  BP (!) 144/80   Pulse 80   Temp 98.3 F (36.8 C) (Axillary)   Resp 16   Ht 5\' 7"  (1.702 m)   Wt 121.8 kg   LMP 07/03/2019   BMI 42.07 kg/m  EFM: baseline 150/moderate variability/+accels, no decels  CVE: Dilation: 8 Effacement (%): 90 Cervical Position: Anterior Station: -1 Presentation: Vertex Exam by:: Dr. Glo Herring    A&P: 39 y.o. [id number] [redacted]w[redacted]d IOL for cHTN  #Induction of Labor: Pit initiated 1730 on 8/20, AROM at 2334, Pitocin paused earlier this morning due to recurrent deep variable decels, turned back on at 1130 and now running at 14. IUPC in place - patient has not yet been adequate since restarting pitocin. SVE 6/70/-1 >> 8/90/-1. Plan to recheck in 3-4 hours.    #Pain: Epidural #FWB: Cat I, EFW 99%ile, shoulder dystocia precautions at delivery  #GBS positive #cHTN: PEC labs normal. UPC .10. BP stable, most recent 143/71. Continue to monitor.   Janet Berlin, MD 9:31 PM

## 2020-03-23 ENCOUNTER — Encounter (HOSPITAL_COMMUNITY)
Admission: AD | Disposition: A | Discharge status: Home / Self Care | Payer: Self-pay | Source: Home / Self Care | Attending: Obstetrics and Gynecology

## 2020-03-23 ENCOUNTER — Encounter (HOSPITAL_COMMUNITY): Payer: Self-pay | Admitting: Obstetrics and Gynecology

## 2020-03-23 DIAGNOSIS — O133 Gestational [pregnancy-induced] hypertension without significant proteinuria, third trimester: Secondary | ICD-10-CM

## 2020-03-23 DIAGNOSIS — O09523 Supervision of elderly multigravida, third trimester: Secondary | ICD-10-CM

## 2020-03-23 DIAGNOSIS — Z3A37 37 weeks gestation of pregnancy: Secondary | ICD-10-CM

## 2020-03-23 DIAGNOSIS — Z302 Encounter for sterilization: Secondary | ICD-10-CM

## 2020-03-23 LAB — BASIC METABOLIC PANEL WITH GFR
Anion gap: 9 (ref 5–15)
BUN: 10 mg/dL (ref 6–20)
CO2: 20 mmol/L — ABNORMAL LOW (ref 22–32)
Calcium: 9.2 mg/dL (ref 8.9–10.3)
Chloride: 104 mmol/L (ref 98–111)
Creatinine, Ser: 1.1 mg/dL — ABNORMAL HIGH (ref 0.44–1.00)
GFR calc Af Amer: >60 mL/min
GFR calc non Af Amer: >60 mL/min
Glucose, Bld: 130 mg/dL — ABNORMAL HIGH (ref 70–99)
Potassium: 3.9 mmol/L (ref 3.5–5.1)
Sodium: 133 mmol/L — ABNORMAL LOW (ref 135–145)

## 2020-03-23 LAB — CBC WITH DIFFERENTIAL/PLATELET
Abs Immature Granulocytes: 0.09 10*3/uL — ABNORMAL HIGH (ref 0.00–0.07)
Basophils Absolute: 0 10*3/uL (ref 0.0–0.1)
Basophils Relative: 0 %
Eosinophils Absolute: 0 10*3/uL (ref 0.0–0.5)
Eosinophils Relative: 0 %
HCT: 30.8 % — ABNORMAL LOW (ref 36.0–46.0)
Hemoglobin: 9.6 g/dL — ABNORMAL LOW (ref 12.0–15.0)
Immature Granulocytes: 1 %
Lymphocytes Relative: 11 %
Lymphs Abs: 1.6 10*3/uL (ref 0.7–4.0)
MCH: 25.1 pg — ABNORMAL LOW (ref 26.0–34.0)
MCHC: 31.2 g/dL (ref 30.0–36.0)
MCV: 80.4 fL (ref 80.0–100.0)
Monocytes Absolute: 1.3 10*3/uL — ABNORMAL HIGH (ref 0.1–1.0)
Monocytes Relative: 8 %
Neutro Abs: 12.5 10*3/uL — ABNORMAL HIGH (ref 1.7–7.7)
Neutrophils Relative %: 80 %
Platelets: 267 10*3/uL (ref 150–400)
RBC: 3.83 MIL/uL — ABNORMAL LOW (ref 3.87–5.11)
RDW: 17.4 % — ABNORMAL HIGH (ref 11.5–15.5)
WBC: 15.6 10*3/uL — ABNORMAL HIGH (ref 4.0–10.5)
nRBC: 0 % (ref 0.0–0.2)

## 2020-03-23 LAB — COMPREHENSIVE METABOLIC PANEL
ALT: 17 U/L (ref 0–44)
AST: 26 U/L (ref 15–41)
Albumin: 2.7 g/dL — ABNORMAL LOW (ref 3.5–5.0)
Alkaline Phosphatase: 159 U/L — ABNORMAL HIGH (ref 38–126)
Anion gap: 12 (ref 5–15)
BUN: 12 mg/dL (ref 6–20)
CO2: 18 mmol/L — ABNORMAL LOW (ref 22–32)
Calcium: 9.3 mg/dL (ref 8.9–10.3)
Chloride: 102 mmol/L (ref 98–111)
Creatinine, Ser: 1.7 mg/dL — ABNORMAL HIGH (ref 0.44–1.00)
GFR calc Af Amer: 43 mL/min — ABNORMAL LOW (ref >60)
GFR calc non Af Amer: 37 mL/min — ABNORMAL LOW (ref >60)
Glucose, Bld: 96 mg/dL (ref 70–99)
Potassium: 3.8 mmol/L (ref 3.5–5.1)
Sodium: 132 mmol/L — ABNORMAL LOW (ref 135–145)
Total Bilirubin: 0.8 mg/dL (ref 0.3–1.2)
Total Protein: 6.1 g/dL — ABNORMAL LOW (ref 6.5–8.1)

## 2020-03-23 LAB — CBC
HCT: 27.9 % — ABNORMAL LOW (ref 36.0–46.0)
Hemoglobin: 8.9 g/dL — ABNORMAL LOW (ref 12.0–15.0)
MCH: 25.9 pg — ABNORMAL LOW (ref 26.0–34.0)
MCHC: 31.9 g/dL (ref 30.0–36.0)
MCV: 81.1 fL (ref 80.0–100.0)
Platelets: 283 10*3/uL (ref 150–400)
RBC: 3.44 MIL/uL — ABNORMAL LOW (ref 3.87–5.11)
RDW: 17.4 % — ABNORMAL HIGH (ref 11.5–15.5)
WBC: 18.9 10*3/uL — ABNORMAL HIGH (ref 4.0–10.5)
nRBC: 0 % (ref 0.0–0.2)

## 2020-03-23 LAB — PROTIME-INR
INR: 1 (ref 0.8–1.2)
Prothrombin Time: 13 seconds (ref 11.4–15.2)

## 2020-03-23 LAB — PREPARE RBC (CROSSMATCH)

## 2020-03-23 SURGERY — Surgical Case
Anesthesia: Epidural

## 2020-03-23 MED ORDER — OXYCODONE HCL 5 MG PO TABS: 5.0000 mg | ORAL_TABLET | Freq: Once | ORAL | Status: DC | PRN

## 2020-03-23 MED ORDER — STERILE WATER FOR IRRIGATION IR SOLN
Status: DC | PRN
  Administered 2020-03-23: 1

## 2020-03-23 MED ORDER — MEPERIDINE HCL 25 MG/ML IJ SOLN: 6.2500 mg | INTRAMUSCULAR | Status: DC | PRN

## 2020-03-23 MED ORDER — LABETALOL HCL 200 MG PO TABS
200.0000 mg | ORAL_TABLET | Freq: Once | ORAL | Status: AC
  Administered 2020-03-23: 200 mg via ORAL
  Filled 2020-03-23: qty 1

## 2020-03-23 MED ORDER — PHENYLEPHRINE 40 MCG/ML (10ML) SYRINGE FOR IV PUSH (FOR BLOOD PRESSURE SUPPORT)
PREFILLED_SYRINGE | INTRAVENOUS | Status: AC
  Filled 2020-03-23: qty 10

## 2020-03-23 MED ORDER — FENTANYL CITRATE (PF) 100 MCG/2ML IJ SOLN
INTRAMUSCULAR | Status: DC | PRN
Start: 2020-03-23 — End: 2020-03-23
  Administered 2020-03-23: 100 ug via EPIDURAL

## 2020-03-23 MED ORDER — NALOXONE HCL 4 MG/10ML IJ SOLN
1.0000 ug/kg/h | INTRAVENOUS | Status: DC | PRN
  Filled 2020-03-23: qty 5

## 2020-03-23 MED ORDER — OXYCODONE HCL 5 MG PO TABS
5.0000 mg | ORAL_TABLET | ORAL | Status: DC | PRN
  Administered 2020-03-24 – 2020-03-25 (×3): 5 mg via ORAL
  Filled 2020-03-23 (×3): qty 1

## 2020-03-23 MED ORDER — DEXTROSE 5 % IV SOLN
INTRAVENOUS | Status: DC | PRN
  Administered 2020-03-23: 3 g via INTRAVENOUS

## 2020-03-23 MED ORDER — SENNOSIDES-DOCUSATE SODIUM 8.6-50 MG PO TABS
2.0000 | ORAL_TABLET | ORAL | Status: DC
  Administered 2020-03-23 – 2020-03-25 (×3): 2 via ORAL
  Filled 2020-03-23 (×3): qty 2

## 2020-03-23 MED ORDER — HYDROMORPHONE HCL 1 MG/ML IJ SOLN: 0.2500 mg | INTRAMUSCULAR | Status: DC | PRN

## 2020-03-23 MED ORDER — ZOLPIDEM TARTRATE 5 MG PO TABS: 5.0000 mg | ORAL_TABLET | Freq: Every evening | ORAL | Status: DC | PRN

## 2020-03-23 MED ORDER — ONDANSETRON HCL 4 MG/2ML IJ SOLN: 4.0000 mg | Freq: Three times a day (TID) | INTRAMUSCULAR | Status: DC | PRN

## 2020-03-23 MED ORDER — NALBUPHINE HCL 10 MG/ML IJ SOLN: 5.0000 mg | INTRAMUSCULAR | Status: DC | PRN

## 2020-03-23 MED ORDER — HYDROMORPHONE HCL 1 MG/ML IJ SOLN: 1.0000 mg | INTRAMUSCULAR | Status: DC | PRN

## 2020-03-23 MED ORDER — SODIUM CHLORIDE 0.9 % IV SOLN
INTRAVENOUS | Status: AC
  Filled 2020-03-23: qty 500

## 2020-03-23 MED ORDER — DEXAMETHASONE SODIUM PHOSPHATE 10 MG/ML IJ SOLN
INTRAMUSCULAR | Status: DC | PRN
  Administered 2020-03-23: 10 mg via INTRAVENOUS

## 2020-03-23 MED ORDER — CEFAZOLIN SODIUM-DEXTROSE 2-4 GM/100ML-% IV SOLN
INTRAVENOUS | Status: AC
  Filled 2020-03-23: qty 100

## 2020-03-23 MED ORDER — PHENYLEPHRINE HCL (PRESSORS) 10 MG/ML IV SOLN
INTRAVENOUS | Status: DC | PRN
  Administered 2020-03-23 (×2): 80 ug via INTRAVENOUS
  Administered 2020-03-23: 120 ug via INTRAVENOUS
  Administered 2020-03-23 (×5): 80 ug via INTRAVENOUS

## 2020-03-23 MED ORDER — DEXTROSE 5 % IV SOLN
3.0000 g | INTRAVENOUS | Status: DC
  Filled 2020-03-23: qty 3000

## 2020-03-23 MED ORDER — DIPHENHYDRAMINE HCL 25 MG PO CAPS: 25.0000 mg | ORAL_CAPSULE | ORAL | Status: DC | PRN

## 2020-03-23 MED ORDER — NALBUPHINE HCL 10 MG/ML IJ SOLN: 5.0000 mg | Freq: Once | INTRAMUSCULAR | Status: DC | PRN

## 2020-03-23 MED ORDER — ENOXAPARIN SODIUM 60 MG/0.6ML ~~LOC~~ SOLN
0.5000 mg/kg | SUBCUTANEOUS | Status: DC
  Administered 2020-03-24 – 2020-03-26 (×3): 60 mg via SUBCUTANEOUS
  Filled 2020-03-23 (×3): qty 0.6

## 2020-03-23 MED ORDER — SODIUM CHLORIDE 0.9 % IR SOLN
Status: DC | PRN
  Administered 2020-03-23: 1000 mL

## 2020-03-23 MED ORDER — TETANUS-DIPHTH-ACELL PERTUSSIS 5-2.5-18.5 LF-MCG/0.5 IM SUSP: 0.5000 mL | Freq: Once | INTRAMUSCULAR | Status: DC

## 2020-03-23 MED ORDER — ONDANSETRON HCL 4 MG/2ML IJ SOLN: 4.0000 mg | Freq: Once | INTRAMUSCULAR | Status: DC | PRN

## 2020-03-23 MED ORDER — GABAPENTIN 100 MG PO CAPS
100.0000 mg | ORAL_CAPSULE | Freq: Two times a day (BID) | ORAL | Status: AC
  Administered 2020-03-23 – 2020-03-24 (×3): 100 mg via ORAL
  Filled 2020-03-23 (×3): qty 1

## 2020-03-23 MED ORDER — WITCH HAZEL-GLYCERIN EX PADS: 1.0000 "application " | MEDICATED_PAD | CUTANEOUS | Status: DC | PRN

## 2020-03-23 MED ORDER — SODIUM CHLORIDE 0.9 % IV SOLN: INTRAVENOUS | Status: DC | PRN

## 2020-03-23 MED ORDER — MENTHOL 3 MG MT LOZG: 1.0000 | LOZENGE | OROMUCOSAL | Status: DC | PRN

## 2020-03-23 MED ORDER — MORPHINE SULFATE (PF) 0.5 MG/ML IJ SOLN
INTRAMUSCULAR | Status: AC
  Filled 2020-03-23: qty 10

## 2020-03-23 MED ORDER — SODIUM CHLORIDE 0.9 % IV SOLN
510.0000 mg | Freq: Once | INTRAVENOUS | Status: AC
  Administered 2020-03-23: 510 mg via INTRAVENOUS
  Filled 2020-03-23: qty 17

## 2020-03-23 MED ORDER — DIBUCAINE (PERIANAL) 1 % EX OINT: 1.0000 "application " | TOPICAL_OINTMENT | CUTANEOUS | Status: DC | PRN

## 2020-03-23 MED ORDER — SIMETHICONE 80 MG PO CHEW
80.0000 mg | CHEWABLE_TABLET | ORAL | Status: DC
  Administered 2020-03-23 – 2020-03-25 (×3): 80 mg via ORAL
  Filled 2020-03-23 (×3): qty 1

## 2020-03-23 MED ORDER — PRENATAL MULTIVITAMIN CH
1.0000 | ORAL_TABLET | Freq: Every day | ORAL | Status: DC
  Administered 2020-03-23 – 2020-03-25 (×3): 1 via ORAL
  Filled 2020-03-23 (×3): qty 1

## 2020-03-23 MED ORDER — ONDANSETRON HCL 4 MG/2ML IJ SOLN
INTRAMUSCULAR | Status: AC
  Filled 2020-03-23: qty 2

## 2020-03-23 MED ORDER — NIFEDIPINE ER OSMOTIC RELEASE 30 MG PO TB24
30.0000 mg | ORAL_TABLET | Freq: Two times a day (BID) | ORAL | Status: DC
  Administered 2020-03-23 – 2020-03-24 (×4): 30 mg via ORAL
  Filled 2020-03-23 (×4): qty 1

## 2020-03-23 MED ORDER — COCONUT OIL OIL: 1.0000 "application " | TOPICAL_OIL | Status: DC | PRN

## 2020-03-23 MED ORDER — SODIUM CHLORIDE 0.9 % IV SOLN: 500.0000 mg | INTRAVENOUS | Status: DC

## 2020-03-23 MED ORDER — OXYTOCIN-SODIUM CHLORIDE 30-0.9 UT/500ML-% IV SOLN
INTRAVENOUS | Status: AC
  Filled 2020-03-23: qty 500

## 2020-03-23 MED ORDER — LIDOCAINE-EPINEPHRINE (PF) 2 %-1:200000 IJ SOLN
INTRAMUSCULAR | Status: DC | PRN
  Administered 2020-03-23 (×4): 5 mL via INTRADERMAL

## 2020-03-23 MED ORDER — LACTATED RINGERS IV SOLN: INTRAVENOUS | Status: AC

## 2020-03-23 MED ORDER — DIPHENHYDRAMINE HCL 25 MG PO CAPS: 25.0000 mg | ORAL_CAPSULE | Freq: Four times a day (QID) | ORAL | Status: DC | PRN

## 2020-03-23 MED ORDER — OXYCODONE HCL 5 MG/5ML PO SOLN: 5.0000 mg | Freq: Once | ORAL | Status: DC | PRN

## 2020-03-23 MED ORDER — MORPHINE SULFATE (PF) 0.5 MG/ML IJ SOLN
INTRAMUSCULAR | Status: DC | PRN
Start: 2020-03-23 — End: 2020-03-23
  Administered 2020-03-23: 3 mg via EPIDURAL

## 2020-03-23 MED ORDER — SOD CITRATE-CITRIC ACID 500-334 MG/5ML PO SOLN
30.0000 mL | ORAL | Status: AC
  Administered 2020-03-23: 30 mL via ORAL
  Filled 2020-03-23: qty 30

## 2020-03-23 MED ORDER — SCOPOLAMINE 1 MG/3DAYS TD PT72
MEDICATED_PATCH | TRANSDERMAL | Status: DC | PRN
  Administered 2020-03-23: 1 via TRANSDERMAL

## 2020-03-23 MED ORDER — NALOXONE HCL 0.4 MG/ML IJ SOLN: 0.4000 mg | INTRAMUSCULAR | Status: DC | PRN

## 2020-03-23 MED ORDER — LACTATED RINGERS IV SOLN: INTRAVENOUS | Status: DC | PRN

## 2020-03-23 MED ORDER — KETOROLAC TROMETHAMINE 30 MG/ML IJ SOLN
30.0000 mg | Freq: Four times a day (QID) | INTRAMUSCULAR | Status: AC | PRN
  Administered 2020-03-23: 30 mg via INTRAVENOUS
  Filled 2020-03-23: qty 1

## 2020-03-23 MED ORDER — SCOPOLAMINE 1 MG/3DAYS TD PT72: 1.0000 | MEDICATED_PATCH | Freq: Once | TRANSDERMAL | Status: DC

## 2020-03-23 MED ORDER — LIDOCAINE HCL (PF) 2 % IJ SOLN
INTRAMUSCULAR | Status: AC
  Filled 2020-03-23: qty 20

## 2020-03-23 MED ORDER — ACETAMINOPHEN 10 MG/ML IV SOLN: 1000.0000 mg | Freq: Once | INTRAVENOUS | Status: DC | PRN

## 2020-03-23 MED ORDER — SODIUM CHLORIDE 0.9% FLUSH: 3.0000 mL | INTRAVENOUS | Status: DC | PRN

## 2020-03-23 MED ORDER — DIPHENHYDRAMINE HCL 50 MG/ML IJ SOLN: 12.5000 mg | INTRAMUSCULAR | Status: DC | PRN

## 2020-03-23 MED ORDER — SIMETHICONE 80 MG PO CHEW: 80.0000 mg | CHEWABLE_TABLET | ORAL | Status: DC | PRN

## 2020-03-23 MED ORDER — SODIUM CHLORIDE 0.9 % IV SOLN
INTRAVENOUS | Status: DC | PRN
  Administered 2020-03-23: 500 mg via INTRAVENOUS

## 2020-03-23 MED ORDER — DEXTROSE 5 % IV SOLN
INTRAVENOUS | Status: AC
  Filled 2020-03-23: qty 3000

## 2020-03-23 MED ORDER — FENTANYL CITRATE (PF) 100 MCG/2ML IJ SOLN
INTRAMUSCULAR | Status: AC
  Filled 2020-03-23: qty 2

## 2020-03-23 MED ORDER — OXYTOCIN-SODIUM CHLORIDE 30-0.9 UT/500ML-% IV SOLN: 2.5000 [IU]/h | INTRAVENOUS | Status: AC

## 2020-03-23 MED ORDER — KETOROLAC TROMETHAMINE 30 MG/ML IJ SOLN: 30.0000 mg | Freq: Four times a day (QID) | INTRAMUSCULAR | Status: AC | PRN

## 2020-03-23 MED ORDER — SIMETHICONE 80 MG PO CHEW
80.0000 mg | CHEWABLE_TABLET | Freq: Three times a day (TID) | ORAL | Status: DC
  Administered 2020-03-23 – 2020-03-25 (×7): 80 mg via ORAL
  Filled 2020-03-23 (×7): qty 1

## 2020-03-23 SURGICAL SUPPLY — 39 items
BENZOIN TINCTURE PRP APPL 2/3 (GAUZE/BANDAGES/DRESSINGS) ×3 IMPLANT
CHLORAPREP W/TINT 26ML (MISCELLANEOUS) ×3 IMPLANT
CLAMP CORD UMBIL (MISCELLANEOUS) IMPLANT
CLOSURE WOUND 1/2 X4 (GAUZE/BANDAGES/DRESSINGS)
CLOTH BEACON ORANGE TIMEOUT ST (SAFETY) ×3 IMPLANT
DRSG OPSITE POSTOP 4X10 (GAUZE/BANDAGES/DRESSINGS) ×3 IMPLANT
ELECT REM PT RETURN 9FT ADLT (ELECTROSURGICAL) ×3
ELECTRODE REM PT RTRN 9FT ADLT (ELECTROSURGICAL) ×1 IMPLANT
EXTRACTOR VACUUM KIWI (MISCELLANEOUS) IMPLANT
GLOVE BIOGEL PI IND STRL 7.0 (GLOVE) ×1 IMPLANT
GLOVE BIOGEL PI IND STRL 9 (GLOVE) ×1 IMPLANT
GLOVE BIOGEL PI INDICATOR 7.0 (GLOVE) ×2
GLOVE BIOGEL PI INDICATOR 9 (GLOVE) ×2
GLOVE SS PI 9.0 STRL (GLOVE) ×3 IMPLANT
GOWN STRL REUS W/TWL 2XL LVL3 (GOWN DISPOSABLE) ×3 IMPLANT
GOWN STRL REUS W/TWL LRG LVL3 (GOWN DISPOSABLE) ×3 IMPLANT
NEEDLE HYPO 25X5/8 SAFETYGLIDE (NEEDLE) IMPLANT
NS IRRIG 1000ML POUR BTL (IV SOLUTION) ×3 IMPLANT
PACK C SECTION WH (CUSTOM PROCEDURE TRAY) ×3 IMPLANT
PAD OB MATERNITY 4.3X12.25 (PERSONAL CARE ITEMS) ×3 IMPLANT
PENCIL SMOKE EVAC W/HOLSTER (ELECTROSURGICAL) ×3 IMPLANT
RTRCTR C-SECT PINK 25CM LRG (MISCELLANEOUS) IMPLANT
RTRCTR C-SECT PINK 34CM XLRG (MISCELLANEOUS) IMPLANT
STRIP CLOSURE SKIN 1/2X4 (GAUZE/BANDAGES/DRESSINGS) IMPLANT
STRIP SURGICAL 1/4 X 6 IN (GAUZE/BANDAGES/DRESSINGS) ×3 IMPLANT
SUT CHROMIC 0 CTX 36 (SUTURE) ×3 IMPLANT
SUT MNCRL 0 VIOLET CTX 36 (SUTURE) ×2 IMPLANT
SUT MONOCRYL 0 CTX 36 (SUTURE) ×6
SUT PLAIN 2 0 (SUTURE) ×3
SUT PLAIN ABS 2-0 CT1 27XMFL (SUTURE) ×1 IMPLANT
SUT VIC AB 0 CT1 27 (SUTURE) ×3
SUT VIC AB 0 CT1 27XBRD ANBCTR (SUTURE) ×1 IMPLANT
SUT VIC AB 2-0 CT1 27 (SUTURE) ×3
SUT VIC AB 2-0 CT1 TAPERPNT 27 (SUTURE) ×1 IMPLANT
SUT VIC AB 4-0 KS 27 (SUTURE) ×3 IMPLANT
SYR BULB IRRIGATION 50ML (SYRINGE) IMPLANT
TOWEL OR 17X24 6PK STRL BLUE (TOWEL DISPOSABLE) ×3 IMPLANT
TRAY FOLEY W/BAG SLVR 14FR LF (SET/KITS/TRAYS/PACK) ×3 IMPLANT
WATER STERILE IRR 1000ML POUR (IV SOLUTION) ×3 IMPLANT

## 2020-03-23 NOTE — Anesthesia Postprocedure Evaluation (Signed)
Anesthesia Post Note  Patient: Denise Moses  Procedure(s) Performed: CESAREAN SECTION (N/A )     Patient location during evaluation: PACU Anesthesia Type: Epidural Level of consciousness: oriented and awake and alert Pain management: pain level controlled Vital Signs Assessment: post-procedure vital signs reviewed and stable Respiratory status: spontaneous breathing, respiratory function stable and nonlabored ventilation Cardiovascular status: blood pressure returned to baseline and stable Postop Assessment: no headache, no backache, no apparent nausea or vomiting and epidural receding Anesthetic complications: no   No complications documented.  Last Vitals:  Vitals:   03/23/20 0645 03/23/20 0650  BP: (!) 160/90   Pulse: 76 79  Resp: 14 (!) 31  Temp:    SpO2: 100% 100%    Last Pain:  Vitals:   03/23/20 0630  TempSrc:   PainSc: 0-No pain   Pain Goal:    LLE Motor Response: Purposeful movement (03/23/20 0615)   RLE Motor Response: Purposeful movement (03/23/20 0615)       Epidural/Spinal Function Cutaneous sensation: Normal sensation (03/23/20 0630), Patient able to flex knees: Yes (03/23/20 0630), Patient able to lift hips off bed: Yes (03/23/20 0630), Back pain beyond tenderness at insertion site: No (03/23/20 0630), Progressively worsening motor and/or sensory loss: No (03/23/20 0630), Bowel and/or bladder incontinence post epidural: No (03/23/20 0630)  Lidia Collum

## 2020-03-23 NOTE — Op Note (Signed)
03/23/2020  6:02 AM  PATIENT:  Denise Moses  39 y.o. female  PRE-OPERATIVE DIAGNOSIS:  Unscheduled Cesarean Section, See Delivery Summary arrest of labor in acute phase, elective permanent sterilization, now right salpingectomy   POST-OPERATIVE DIAGNOSIS:  arrest of dilation in active phase labor with permanent sterilization  PROCEDURE:  Procedure(s) with comments: CESAREAN SECTION (N/A) - Arrest of Dilation Primary low transverse cervical cesarean section right second heart SURGEON:  Surgeon(s) and Role:    Jonnie Kind, MD - Primary    * Marsala, Placido Sou, MD - Fellow  PHYSICIAN ASSISTANT:   ASSISTANTS: none   ANESTHESIA:   epidural  EBL:  741 mL  My estimate 1000 cc.  BLOOD ADMINISTERED:none  DRAINS: Urinary Catheter (Foley)   LOCAL MEDICATIONS USED:  NONE  SPECIMEN: Placenta  DISPOSITION OF SPECIMEN:  Labor and delivery  COUNTS:  YES  TOURNIQUET:  * No tourniquets in log *  DICTATION: .Dragon Dictation  PLAN OF CARE: Has inpatient admission  PATIENT DISPOSITION:  PACU - hemodynamically stable.   Delay start of Pharmacological VTE agent (>24hrs) due to surgical blood loss or risk of bleeding: yes Details of procedure: Patient was taken operating room prepped and draped for lower abdominal surgery.  Fetal heart rate was transiently low shortly before the surgery briefly but recovered prior to prepping and draping. Timeout was conducted and procedure confirmed by surgical team.  Transverse lower abdominal incision was made with sharp dissection to the fascia which was opened transversely and elevated off the rectus muscles, peritoneum entering the midline and bladder flap without and Alexis wound retractor positioned. Transverse lower uterine incision was made and extended cephalad and caudad, the fetal vertex was rotated out of its occiput posterior position by Dr. Elisha Headland and fetus delivered without difficulty.  There was a nuchal cord x1 released.   There was no malodor.  See pediatricians notes regarding the baby.  Fetal vertex was molded adequately to indicate strong labor  Cord was clamped some distance from the baby, passed to the waiting pediatricians, cut earlier than 1 minute due to fetal bradycardia. Placenta delivered intact cord blood samples having been obtained.  Membranes were intact.  The uterus was quite boggy in the lower uterine segment but responded well to traction countertraction IV oxytocin and suture closure.  The first layer was running locking 0 Monocryl second layer was oversewn with layer of running 0 Monocryl with good hemostasis and tissue approximation Bladder flap was inspected and superficial cautery used as necessary to achieve hemostasis  Abdomen was irrigated.  Anterior peritoneum was closed with running 2-0 Vicryl.  Fascia was closed using running 0 Vicryl.  Subcutaneous tissues were cauterized made hemostatic, mobilized superiorly and inferiorly to lateral proper approximation of the skin incision, followed by interrupted 2-0 Vicryl closure of the subcutaneous fatty tissues with good cosmetic results and then subcuticular 4-0 Vicryl used to close the skin, tolerated well by the patient.  EBL estimated at 670 100 cc by mechanical estimate 1000 cc by surgeon.  Patient's urine remained concentrated so fluids will be decreased postoperatively uterine tone remained good and hemostasis was stable condition to recovery room good

## 2020-03-23 NOTE — Transfer of Care (Signed)
Immediate Anesthesia Transfer of Care Note  Patient: Denise Moses  Procedure(s) Performed: CESAREAN SECTION (N/A )  Patient Location: PACU  Anesthesia Type:Epidural  Level of Consciousness: awake, alert  and oriented  Airway & Oxygen Therapy: Patient Spontanous Breathing  Post-op Assessment: Report given to RN and Post -op Vital signs reviewed and stable  Post vital signs: Reviewed and stable  Last Vitals:  Vitals Value Taken Time  BP 127/82 03/23/20 0546  Temp    Pulse 84 03/23/20 0549  Resp 20 03/23/20 0549  SpO2 100 % 03/23/20 0549  Vitals shown include unvalidated device data.  Last Pain:  Vitals:   03/23/20 0006  TempSrc:   PainSc: 8          Complications: No complications documented.

## 2020-03-23 NOTE — Progress Notes (Signed)
Dr. Glo Herring at bedside discussing c section and consenting patient for procedure.

## 2020-03-23 NOTE — Progress Notes (Signed)
Labor Progress Note Lakecia Deschamps is a 39 y.o. [id number] at [redacted]w[redacted]d presented for IOL for cHTN.  S: Feels exhausted   O:  BP (!) 143/90   Pulse 86   Temp 99.1 F (37.3 C) (Axillary)   Resp 16   Ht 5\' 7"  (1.702 m)   Wt 121.8 kg   LMP 07/03/2019   BMI 42.07 kg/m  EFM: 145/mod variability/+ accels/occasional late decels  CVE: Dilation: 8 Effacement (%): 80 Cervical Position: Anterior Station: -1 Presentation: Vertex Exam by:: Dr. Berniece Andreas    A&P: 39 y.o. [id number] [redacted]w[redacted]d presenting for IOL for cHTN. #Labor: SVE has remained 8 cm since 2100. Now with increasing caput and no change in station. Patient has now has no change in cervix with > 6 hours of inadequate contractions, overall meeting criteria for arrest of dilation. Discussed findings with patient and recommendations for c section.   The risks of cesarean section were discussed with the patient including but were not limited to: bleeding which may require transfusion or reoperation; infection which may require antibiotics; injury to bowel, bladder, ureters or other surrounding organs; injury to the fetus; need for additional procedures including hysterectomy in the event of a life-threatening hemorrhage; placental abnormalities wth subsequent pregnancies, incisional problems, thromboembolic phenomenon and other postoperative/anesthesia complications. The patient concurred with the proposed plan, giving informed written consent for the procedures.  Patient has been NPO other than sips/chips since yesterday, she will remain NPO for procedure. Anesthesia and OR aware.  Preoperative prophylactic antibiotics and SCDs ordered on call to the OR. To OR when ready.   #Pain: Epidural #FWB: Cat I at this time  #GBS positive #cHTN: PEC labs normal. UPC 0.10. BP stable. Continue to monitor.   Janet Berlin, MD 3:04 AM

## 2020-03-23 NOTE — Brief Op Note (Addendum)
03/23/2020  6:02 AM  PATIENT:  Denise Moses  39 y.o. female  PRE-OPERATIVE DIAGNOSIS:  Unscheduled Cesarean Section, See Delivery Summary arrest of labor in acute phase, elective permanent sterilization, now right salpingectomy   POST-OPERATIVE DIAGNOSIS:  arrest of dilation in active phase labor with permanent sterilization  PROCEDURE:  Procedure(s) with comments: CESAREAN SECTION (N/A) - Arrest of Dilation Primary low transverse cervical cesarean section right second heart SURGEON:  Surgeon(s) and Role:    Jonnie Kind, MD - Primary    * Marsala, Placido Sou, MD - Fellow  PHYSICIAN ASSISTANT:   ASSISTANTS: none   ANESTHESIA:   epidural  EBL:  741 mL  My estimate 1000 cc.  BLOOD ADMINISTERED:none  DRAINS: Urinary Catheter (Foley)   LOCAL MEDICATIONS USED:  NONE  SPECIMEN: Placenta  DISPOSITION OF SPECIMEN:  Labor and delivery  COUNTS:  YES  TOURNIQUET:  * No tourniquets in log *  DICTATION: .Dragon Dictation  PLAN OF CARE: Has inpatient admission  PATIENT DISPOSITION:  PACU - hemodynamically stable.   Delay start of Pharmacological VTE agent (>24hrs) due to surgical blood loss or risk of bleeding: yes Details of procedure: Patient was taken operating room prepped and draped for lower abdominal surgery.  Fetal heart rate was transiently low shortly before the surgery briefly but recovered prior to prepping and draping. Timeout was conducted and procedure confirmed by surgical team.  Transverse lower abdominal incision was made with sharp dissection to the fascia which was opened transversely and elevated off the rectus muscles, peritoneum entering the midline and bladder flap without and Alexis wound retractor positioned. Transverse lower uterine incision was made and extended cephalad and caudad, the fetal vertex was rotated out of its occiput posterior position by Dr. Elisha Headland and fetus delivered without difficulty.  There was a nuchal cord x1 released.   There was no malodor.  See pediatricians notes regarding the baby.  Fetal vertex was molded adequately to indicate strong labor  Cord was clamped some distance from the baby, passed to the waiting pediatricians, cut earlier than 1 minute due to fetal bradycardia. Placenta delivered intact cord blood samples having been obtained.  Membranes were intact.  The uterus was quite boggy in the lower uterine segment but responded well to traction countertraction IV oxytocin and suture closure.  The first layer was running locking 0 Monocryl second layer was oversewn with layer of running 0 Monocryl with good hemostasis and tissue approximation Bladder flap was inspected and superficial cautery used as necessary to achieve hemostasis  Abdomen was irrigated.  Anterior peritoneum was closed with running 2-0 Vicryl.  Fascia was closed using running 0 Vicryl.  Subcutaneous tissues were cauterized made hemostatic, mobilized superiorly and inferiorly to lateral proper approximation of the skin incision, followed by interrupted 2-0 Vicryl closure of the subcutaneous fatty tissues with good cosmetic results and then subcuticular 4-0 Vicryl used to close the skin, tolerated well by the patient.  EBL estimated at 670 100 cc by mechanical estimate 1000 cc by surgeon.  Patient's urine remained concentrated so fluids will be decreased postoperatively uterine tone remained good and hemostasis was stable condition to recovery room good

## 2020-03-23 NOTE — Progress Notes (Signed)
Labor Progress Note Denise Moses is a 39 y.o. [id number] at [redacted]w[redacted]d presented for IOL for cHTN.  S: Still feeling tired, getting some rest in between position changes.  O:  BP 133/85   Pulse 77   Temp 99.1 F (37.3 C) (Axillary)   Resp 18   Ht 5\' 7"  (1.702 m)   Wt 121.8 kg   LMP 07/03/2019   BMI 42.07 kg/m  EFM: 145/mod variability/+ accels/occasional late decels  CVE: Dilation: 8 Effacement (%): 90 Cervical Position: Anterior Station: -1 Presentation: Vertex Exam by:: Ferguson MD   A&P: 39 y.o. [id number] [redacted]w[redacted]d presenting for IOL for cHTN. #Labor: Cervical dilation revised to be 8cm on last check, so patient has made no cervical change since 2100. Plan to recheck at 0300 and call c-section at that time if no cervical change. Patient having several late decels, given 500cc fluid bolus at [place] with improvement in [place]. Pitocin at 24, will continue to increase as tolerated.  #Pain: Epidural #FWB: Initially Cat II with occasional late decels but good variability and return to baseline, improved to Cat I after 500cc fluid bolus #GBS positive #cHTN: PEC labs normal. UPC 0.10. BP stable, most recent 143/90. Continue to monitor.   Maureen Ralphs, MD 1:18 AM

## 2020-03-24 DIAGNOSIS — Z2989 Encounter for other specified prophylactic measures: Secondary | ICD-10-CM

## 2020-03-24 DIAGNOSIS — Z298 Encounter for other specified prophylactic measures: Secondary | ICD-10-CM

## 2020-03-24 LAB — BASIC METABOLIC PANEL
Anion gap: 8 (ref 5–15)
BUN: 8 mg/dL (ref 6–20)
CO2: 22 mmol/L (ref 22–32)
Calcium: 9.4 mg/dL (ref 8.9–10.3)
Chloride: 106 mmol/L (ref 98–111)
Creatinine, Ser: 0.81 mg/dL (ref 0.44–1.00)
GFR calc Af Amer: >60 mL/min (ref >60)
GFR calc non Af Amer: >60 mL/min (ref >60)
Glucose, Bld: 92 mg/dL (ref 70–99)
Potassium: 3.3 mmol/L — ABNORMAL LOW (ref 3.5–5.1)
Sodium: 136 mmol/L (ref 135–145)

## 2020-03-24 LAB — RPR: RPR Ser Ql: NONREACTIVE — AB

## 2020-03-24 MED ORDER — IBUPROFEN 800 MG PO TABS
800.0000 mg | ORAL_TABLET | Freq: Four times a day (QID) | ORAL | Status: DC | PRN
  Administered 2020-03-24 – 2020-03-26 (×8): 800 mg via ORAL
  Filled 2020-03-24 (×8): qty 1

## 2020-03-24 NOTE — Procedures (Deleted)
Circumcision Procedure Note  Preprocedural Diagnoses: Parental desire for neonatal circumcision, normal female phallus, prophylaxis against HIV infection and other infections (ICD10 Z29.8)  Postprocedural Diagnoses:  The same. Status post routine circumcision  Procedure: Neonatal Circumcision using Gomco Clamp  Proceduralist: Renee Beale L Knolan Simien, DO  Preprocedural Counseling: Parent desires circumcision for this female infant.  Circumcision procedure details discussed, risks and benefits of procedure were also discussed.  The benefits include but are not limited to: reduction in the rates of urinary tract infection (UTI), penile cancer, sexually transmitted infections including HIV, penile inflammatory and retractile disorders.  Circumcision also helps obtain better and easier hygiene of the penis.  Risks include but are not limited to: bleeding, infection, injury of glans which may lead to penile deformity or urinary tract issues or Urology intervention, unsatisfactory cosmetic appearance and other potential complications related to the procedure.  It was emphasized that this is an elective procedure.  Written informed consent was obtained.  Anesthesia: 1% lidocaine local, Tylenol  EBL: Minimal  Complications: None immediate  Procedure Details:  A timeout was performed and the infant's identify verified prior to starting the procedure. The infant was laid in a supine position, and an alcohol prep was done.  A dorsal penile nerve block was performed with 1% lidocaine. The area was then cleaned with betadine and draped in sterile fashion.   Gomco:    A dorsal penile nerve block was performed with 0.8 mL 1% lidocaine.  The area was then cleaned with betadine and draped in sterile fashion.  Two straight hemostats were applied at the 2 o'clock and 10 o'clock positions on the foreskin.  While maintaining traction, a curved hemostat was used to separate the glans and the inner layer of mucosa. A straight  hemostat was then placed at the 12 o'clock position and applied in the midline for hemostasis.  The hemostat was then removed and scissors were used to cut along the crushed skin to its most proximal point. The foreskin was retracted over the glans using gauze, removing any adhesions with blunt dissection.  The foreskin was then placed back over the glans.  The 1.1 Gomco bell was inserted over the glans.  The two hemostats were removed after application of a third hemostat to hold the foreskin and underlying mucosa over the bell.  The incision was guided above the base plate of the gomco.  The clamp was then attached and tightened until the foreskin was crushed between the bell and the base plate.  A scalpel was then used to cut the foreskin above the base plate. The thumbscrew was then loosened, base plate removed and then bell removed with gentle traction.   Pressure was applied to the area with gauze for approximately one minute, and then removed. The area was inspected and found to be hemostatic.  Merilyn Baba, [place] for [employer]

## 2020-03-24 NOTE — Progress Notes (Addendum)
POSTPARTUM PROGRESS NOTE  Subjective: Denise Moses is a 39 y.o. [id number] POD#1 s/p pLTCS at [redacted]w[redacted]d.  She reports she doing well. No acute events overnight. She denies any problems with ambulating, voiding or po intake. Had one episode of vomiting yesterday which has since resolved, denies current nausea or vomiting. She has not passed flatus. Pain is well controlled.  Lochia is less than menses.  Objective: Blood pressure 134/61, pulse 77, temperature 98.7 F (37.1 C), temperature source Oral, resp. rate 18, height 5\' 7"  (1.702 m), weight 121.8 kg, last menstrual period 07/03/2019, SpO2 100 %, unknown if currently breastfeeding.  Physical Exam:  General: alert, cooperative and no distress Chest: no respiratory distress Abdomen: soft, non-tender  Uterine Fundus: firm, appropriately tender. Incision covered with honeycomb dressing with small areas of marked old blood drainage, no new drainage  Extremities: No calf swelling or tenderness  trace edema  Recent Labs    03/23/20 0229 03/23/20 1345  HGB 9.6* 8.9*  HCT 30.8* 27.9*    Assessment/Plan: Denise Moses is a 39 y.o. [id number] s/p pLTCS with salpingectomy at [redacted]w[redacted]d for failure to progress at 8cm.  Routine Postpartum Care: Doing well, pain well-controlled.  -- Continue routine care, lactation support  -- Contraception: S/p salpingectomy -- Feeding: Breast -- cHTN: Pressures currently controlled in 120s-130s/60s range. Asymptomatic. On Procardia XL 30mg  BID, got single dose labetalol 200mg  at 0830 on 08/22. PEC labs normal. UPC 0.10. -- Anemia: Hgb 9.6 --> 8.9. Asymptomatic. Start FeSO4 325mg  q48h. -- AKI: Creatinine elevated to 1.7 on 08/22, repeat 1.1 08/22 at 1345. S/p IVF, repeat BMP pending.  Dispo: Plan for discharge in 1-2 days pending normal postpartum course.  Denise Mott, MD PGY-3 Family Medicine Resident  GME ATTESTATION:  I saw and evaluated the patient. I agree with the findings and the plan of  care as documented in the resident's note.  Denise Baba, DO OB Fellow, [place] for [place] 03/24/2020 12:38 PM

## 2020-03-24 NOTE — Clinical Social Work Maternal (Signed)
CLINICAL SOCIAL WORK MATERNAL/CHILD NOTE  Patient Details  Name: Denise Moses MRN: [id number] Date of Birth: [birth year]  Date:  03/24/2020  Clinical Social Worker Initiating Note:  Durward Fortes, LCSW Date/Time: Initiated:  03/24/20/1000     Child's Name:  Margarette Asal   Biological Parents:  Mother, Father Shalee Paolo, [place])   Need for Interpreter:  None   Reason for Referral:  Current Substance Use/Substance Use During Pregnancy    Address:  [address] [place] [address]    Phone number:  [phone] (home)     Additional phone number: none   Household Members/Support Persons (HM/SP):   Household Member/Support Person 1, Household Member/Support Person 2   HM/SP Name Relationship DOB or Age  HM/SP -1  Denise Moses   MOB  02/08/1981  HM/SP -2 Denise Moses  daughter 10/06/2005  HM/SP -3 Denise Moses  daughter 12/22/2006  HM/SP -4 Denise Moses Cancer  son 05/20/2011  HM/SP -5        HM/SP -6        HM/SP -7        HM/SP -8          Natural Supports (not living in the home):  Extended Family   Professional Supports: Case Manager/Social Worker Manufacturing engineer)   Employment: Other (comment) (on leave)   Type of Work: [place]   Education:  [place] arranged:  n/a  Museum/gallery curator Resources:  Medicaid   Other Resources:  Physicist, medical , [place] Considerations Which May Impact Care:  none   Strengths:  Ability to meet basic needs , Compliance with medical plan    Psychotropic Medications:       None reported.   Pediatrician:     not yet chosen.   Pediatrician List:   [facility]      Pediatrician Fax Number:    Risk Factors/Current Problems:  None   Cognitive State:  Able to Concentrate , Insightful , Alert    Mood/Affect:  Relaxed , Comfortable , Interested , Happy    CSW  Assessment: CSW consulted as MOB reported THC use in pregnancy. CSW spoke with MOB at bedside to address further needs.   CSW congratulated MOB on the birth of infant. CSW advised MOB of HIPPA policy in which MOB was agreeable to having MOB's brother leave room. CSW then advised MOB of CSW's role and the reason for CSW coming to visit with her. MOB reported. MOB reported that she did use THC as she was unable to eat. MOB reported that she also has a diagnosis of MS and MOB reported that she was taking a medication for this and then  It was stopped once pregnancy  was confirmed MOB reported "they didn't wean me off of it at all". CSW understanding and advised MOB of the hospital drug screen policy. MOB reported that she understood the policy and was understanding of CSW needing to make CPS report. MOB denies  having any other CPS hx in the past or at this time.   MOB reported that she has no mental health hx. MOB reported that she has been feeling happy since she gave birth and reported that she has all needed items to care for infant. MOB reported that FOB is a great support for her as well  as other family members. MOB expressed that she gets [facility] and Physicist, medical and reported that she has a Financial trader that is very helpful to her. MOB reported that her pregnancy was hard due to MS diagnosis but MOB expressed that "he is truly perfect". CSW praised MOB for such a perfect name and provided MOB with contact information for [facility]. MOB expressed no other needs.   CSW took time to provide MOB with PPD and SIDS education. MOB was given PPD Checklist to keep track of feelings as they relate to PPD. MOB reported that she has a safe space for infant to sleep in once arrived home. MOB thanked CSW and reported no other needs.   CSW has made [place] report for infants UDS result Positive for THC. No barriers to d/c.   CSW Plan/Description:  No Further Intervention Required/No Barriers to Discharge, Sudden  Infant Death Syndrome (SIDS) Education, Perinatal Mood and Anxiety Disorder (PMADs) Education, CSW Will Continue to Monitor Umbilical Cord Tissue Drug Screen Results and Make Report if [facility], [place], Child Protective Service Report     Jodelle Gross 03/24/2020, 10:25 AM

## 2020-03-25 LAB — BPAM RBC
Blood Product Expiration Date: 202109172359
Blood Product Expiration Date: 202109172359
ISSUE DATE / TIME: 202108180940
ISSUE DATE / TIME: 202108201919
Unit Type and Rh: 5100
Unit Type and Rh: 5100

## 2020-03-25 LAB — TYPE AND SCREEN
ABO/RH(D): O POS
Antibody Screen: NEGATIVE
Unit division: 0
Unit division: 0

## 2020-03-25 LAB — SURGICAL PATHOLOGY

## 2020-03-25 MED ORDER — NIFEDIPINE ER OSMOTIC RELEASE 30 MG PO TB24
90.0000 mg | ORAL_TABLET | Freq: Every day | ORAL | Status: DC
  Administered 2020-03-26: 90 mg via ORAL
  Filled 2020-03-25: qty 3

## 2020-03-25 MED ORDER — ACETAMINOPHEN 325 MG PO TABS
650.0000 mg | ORAL_TABLET | Freq: Once | ORAL | Status: AC
  Administered 2020-03-25: 650 mg via ORAL
  Filled 2020-03-25: qty 2

## 2020-03-25 MED ORDER — NIFEDIPINE ER OSMOTIC RELEASE 30 MG PO TB24
60.0000 mg | ORAL_TABLET | Freq: Every day | ORAL | Status: DC
  Administered 2020-03-25: 60 mg via ORAL
  Filled 2020-03-25: qty 2

## 2020-03-25 MED ORDER — OXYCODONE HCL 5 MG PO TABS
5.0000 mg | ORAL_TABLET | ORAL | Status: DC | PRN
  Administered 2020-03-25 – 2020-03-26 (×2): 5 mg via ORAL
  Filled 2020-03-25 (×2): qty 1

## 2020-03-25 MED ORDER — POLYETHYLENE GLYCOL 3350 17 G PO PACK
17.0000 g | PACK | Freq: Every day | ORAL | Status: DC | PRN
  Administered 2020-03-25: 17 g via ORAL
  Filled 2020-03-25: qty 1

## 2020-03-25 NOTE — Progress Notes (Signed)
POSTPARTUM PROGRESS NOTE  Subjective: Denise Moses is a 39 y.o. [id number] POD#2 s/p pLTCS at [redacted]w[redacted]d.  She reports she doing well. No acute events overnight. She denies any problems with ambulating, voiding or po intake. Denies nausea or vomiting. She is passing flatus. Pain is well controlled. Lochia is minimal.  Objective: Blood pressure (!) 141/77, pulse 92, temperature 98.8 F (37.1 C), temperature source Oral, resp. rate 18, height 5\' 7"  (1.702 m), weight 121.8 kg, last menstrual period 07/03/2019, SpO2 100 %, unknown if currently breastfeeding.  Physical Exam:  General: alert, cooperative and no distress Chest: no respiratory distress Abdomen: soft, non-tender  Uterine Fundus: firm, appropriately tender. Incision covered with honeycomb dressing with small areas of marked old blood drainage, no new drainage. Extremities: No calf swelling or tenderness  trace edema  Recent Labs    03/23/20 0229 03/23/20 1345  HGB 9.6* 8.9*  HCT 30.8* 27.9*    Assessment/Plan: Denise Moses is a 39 y.o. [id number] s/p pLTCS with salpingectomy at [redacted]w[redacted]d for failure to progress at 8cm.  Routine Postpartum Care: Doing well, pain well-controlled.  -- Continue routine care, lactation support  -- Contraception: S/p salpingectomy -- Feeding: Breast -- cHTN: Pt taking labetalol 600mg  BID on admission. Pt was then transitioned to procardia XL 30mg  BID on 8/22. Given elevated blood pressures to 662H systolic overnight on 4/76 will increase to procardia 60mg  XL this AM. No signs/symptoms concerning for preeclampsia today. Will continue to monitor BPs throughout the day and consider additional evening dose as clinically indicated. -- Anemia: Hgb 9.6 --> 8.9. Asymptomatic. Continue FeSO4 325mg  q48h (started 8/23). -- AKI: Creatinine elevated to 1.7 on 08/22, repeat 1.1 08/22 at 1345. S/p IVF with f/u Cr 0.81.  Dispo: Plan for discharge on POD#3 pending control of BP.  [name], MD OB  Fellow, Faculty [facility] 03/25/2020 8:50 AM

## 2020-03-26 MED ORDER — OXYCODONE HCL 5 MG PO TABS: 5.0000 mg | ORAL_TABLET | Freq: Four times a day (QID) | ORAL | 0 refills | Status: DC | PRN

## 2020-03-26 MED ORDER — IBUPROFEN 800 MG PO TABS: 800.0000 mg | ORAL_TABLET | Freq: Three times a day (TID) | ORAL | 0 refills | Status: DC | PRN

## 2020-03-26 MED ORDER — OXYCODONE HCL 5 MG PO TABS
5.0000 mg | ORAL_TABLET | Freq: Four times a day (QID) | ORAL | 0 refills | Status: DC | PRN
Start: 2020-03-26 — End: 2020-05-07

## 2020-03-26 MED ORDER — ACETAMINOPHEN 325 MG PO TABS: 650.0000 mg | ORAL_TABLET | Freq: Four times a day (QID) | ORAL | 2 refills | Status: DC | PRN

## 2020-03-26 MED ORDER — ACETAMINOPHEN 325 MG PO TABS: 650.0000 mg | ORAL_TABLET | Freq: Four times a day (QID) | ORAL | 2 refills | Status: AC | PRN

## 2020-03-26 MED ORDER — NIFEDIPINE ER OSMOTIC RELEASE 90 MG PO TB24
90.0000 mg | ORAL_TABLET | Freq: Every day | ORAL | 2 refills | Status: DC
Start: 2020-03-26 — End: 2020-05-08

## 2020-03-26 MED ORDER — COCONUT OIL OIL: 1.0000 "application " | TOPICAL_OIL | 0 refills | Status: DC | PRN

## 2020-03-26 MED FILL — ACETAMINOPHEN 325 MG TABS: 325 | 12 days supply | Qty: 100 | Fill #0

## 2020-03-26 MED FILL — IBUPROFEN 800 MG TAB: 800 | 10 days supply | Qty: 30 | Fill #0

## 2020-03-26 MED FILL — NIFEdipine ER 90 MG TB24: 90 | 60 days supply | Qty: 60 | Fill #0

## 2020-03-26 MED FILL — oxyCODONE HCL 5 MG TABS: 5 | 4 days supply | Qty: 12 | Fill #0

## 2020-03-26 NOTE — Discharge Summary (Addendum)
Postpartum Discharge Summary    Patient Name: Denise Moses DOB: 02-07-1981 MRN: [id number]  Date of admission: 03/21/2020 Delivery date:03/23/2020  Delivering provider: Jonnie Kind  Date of discharge: 03/26/2020  Admitting diagnosis: Chronic hypertension affecting pregnancy [O10.919] Intrauterine pregnancy: [redacted]w[redacted]d     Secondary diagnosis:  Active Problems:   Herpes simplex antibody positive   Multiple sclerosis ([place])   GBS bacteriuria   AMA (advanced maternal age) multigravida 35+   Maternal obesity affecting pregnancy, antepartum   Chronic hypertension affecting pregnancy   Alpha thalassemia silent carrier   Cesarean delivery, delivered, current hospitalization  Additional problems: as noted above    Discharge diagnosis: Cesarean Delivery Delivered                                       Post partum procedures:postpartum tubal ligation Augmentation: AROM and Pitocin Complications: None  Hospital course: Induction of Labor With Cesarean Section   39 y.o. yo [id number] at [redacted]w[redacted]d was admitted to the hospital 03/21/2020 for induction of labor. On admission, FB was placed and pt was started on pitocin. However, no change in cervical exam for > 6 hours of inadequate contractions. The patient went for cesarean section due to arrest of dilation at 8cm. Delivery details are as follows: Membrane Rupture Time/Date: 11:34 PM ,03/21/2020   Delivery Method:C-Section, Low Transverse  Details of operation can be found in separate operative Note.  Postpartum period complicated by drop in Hgb from 9.6 to 8.9 at which time pt receive IV iron infusion. She was discharged on procardia XL $RemoveBefo'90mg'cEZNXTgfuSk$  daily and scheduled for a 1 week BP check. She is ambulating, tolerating a regular diet, passing flatus, and urinating well.  Patient is discharged home in stable condition on 03/26/20.     Newborn Data: Birth date:03/23/2020  Birth time:4:25 AM  Gender:Female  Living status:Living  Apgars:8 ,8  Weight:8  lb 2.3 oz (3.695 kg)                                Magnesium Sulfate received: No BMZ received: No Rhophylac:N/A MMR:N/A T-DaP:Given prenatally Flu: No Transfusion:No  Physical exam  Vitals:   03/25/20 1640 03/25/20 1745 03/25/20 2021 03/26/20 0517  BP: (!) 142/72 137/67 137/70 (!) 116/58  Pulse: 85 85 88 88  Resp: $Remo'18  18 18  'KHJRb$ Temp: 98.6 F (37 C)  98.5 F (36.9 C) 98.4 F (36.9 C)  TempSrc:   Oral Oral  SpO2: 100%  100% 100%  Weight:      Height:       General: alert, cooperative and no distress Lochia: appropriate Uterine Fundus: firm Incision: Healing well with no significant drainage DVT Evaluation: 1+ edema b/l LE. No tenderness to palpation. Patient is up and walking. Labs: Lab Results  Component Value Date   WBC 18.9 (H) 03/23/2020   HGB 8.9 (L) 03/23/2020   HCT 27.9 (L) 03/23/2020   MCV 81.1 03/23/2020   PLT 283 03/23/2020   CMP Latest Ref Rng & Units 03/24/2020  Glucose 70 - 99 mg/dL 92  BUN 6 - 20 mg/dL 8  Creatinine 0.44 - 1.00 mg/dL 0.81  Sodium 135 - 145 mmol/L 136  Potassium 3.5 - 5.1 mmol/L 3.3(L)  Chloride 98 - 111 mmol/L 106  CO2 22 - 32 mmol/L 22  Calcium 8.9 - 10.3 mg/dL 9.4  Total Protein 6.5 - 8.1 g/dL -  Total Bilirubin 0.3 - 1.2 mg/dL -  Alkaline Phos 38 - 126 U/L -  AST 15 - 41 U/L -  ALT 0 - 44 U/L -   Edinburgh Score: Edinburgh Postnatal Depression Scale Screening Tool 03/24/2020  I have been able to laugh and see the funny side of things. 0  I have looked forward with enjoyment to things. 0  I have blamed myself unnecessarily when things went wrong. 0  I have been anxious or worried for no good reason. 0  I have felt scared or panicky for no good reason. 0  Things have been getting on top of me. 0  I have been so unhappy that I have had difficulty sleeping. 0  I have felt sad or miserable. 0  I have been so unhappy that I have been crying. 0  The thought of harming myself has occurred to me. 0  Edinburgh Postnatal Depression  Scale Total 0     After visit meds:  Allergies as of 03/26/2020   No Known Allergies      Medication List     STOP taking these medications    aspirin EC 81 MG tablet   cholecalciferol 25 MCG (1000 UNIT) tablet Commonly known as: VITAMIN D3   Comfort Fit Maternity Supp Lg Misc   famotidine 20 MG tablet Commonly known as: PEPCID   labetalol 200 MG tablet Commonly known as: NORMODYNE   ondansetron 4 MG disintegrating tablet Commonly known as: Zofran ODT       TAKE these medications    acetaminophen 325 MG tablet Commonly known as: Tylenol Take 2 tablets (650 mg total) by mouth every 6 (six) hours as needed for mild pain.   coconut oil Oil Apply 1 application topically as needed (nipple pain).   ibuprofen 800 MG tablet Commonly known as: ADVIL Take 1 tablet (800 mg total) by mouth every 8 (eight) hours as needed for moderate pain or cramping.   NIFEdipine 90 MG 24 hr tablet Commonly known as: PROCARDIA XL/NIFEDICAL-XL Take 1 tablet (90 mg total) by mouth daily.   oxyCODONE 5 MG immediate release tablet Commonly known as: Oxy IR/ROXICODONE Take 1 tablet (5 mg total) by mouth every 6 (six) hours as needed for up to 12 doses for severe pain.   Prenate Pixie 10-0.6-0.4-200 MG Caps Take 1 tablet by mouth daily.               Discharge Care Instructions  (From admission, onward)           Start     Ordered   03/26/20 0000  Leave dressing on - Keep it clean, dry, and intact until clinic visit        03/26/20 0637           Discharge home in stable condition Infant Feeding:  both Infant Disposition:home with mother Discharge instruction: per After Visit Summary and Postpartum booklet. Activity: Advance as tolerated. Pelvic rest for 6 weeks.  Diet: routine diet Future Appointments:No future appointments. Follow up Visit:  Please schedule this patient for a In person postpartum visit in 4 weeks with the following provider: Any  provider. Additional Postpartum F/U:Incision check 1 week and BP check 1 week  High risk pregnancy complicated by:  cHTN, CS secondary to arrest of dilation Delivery mode:  C-Section, Low Transverse  Anticipated Birth Control:  BTL done PP  [name] PGY-1 Family Medicine   Attestation of Supervision of  Student:  I confirm that I have verified the information documented in the  resident's  note and that I have also personally reperformed the history, physical exam and all medical decision making activities.  I have verified that all services and findings are accurately documented in this student's note; and I agree with management and plan as outlined in the documentation. I have also made any necessary editorial changes.  Counseled pt on return precautions, including signs/symptoms of preeclampsia prior to discharge.  [name], [place] for [facility], [facility] 03/26/2020 7:16 AM

## 2020-03-31 ENCOUNTER — Telehealth: Payer: Self-pay | Admitting: *Deleted

## 2020-03-31 ENCOUNTER — Encounter: Payer: Self-pay | Admitting: *Deleted

## 2020-03-31 NOTE — Patient Outreach (Signed)
Care Coordination  03/31/2020  Caria Transue Oct 25, 1980 [id number]  Referral received for patient from [facility] for follow up with LCSW. Medicaid Managed Care team noted CSW intervention during Inpatient visit and subsequent referral to CPS. Patient has nurse visit on 9/1 and provider visit on 9/10. HR Medicaid Managed Care team will follow closely and address outstanding Care Management/Care Coordination needs as appropriate.   Plan: CM team will follow.   [place] Management Coordinator Direct Dial:  [phone]  Fax: [phone]

## 2020-03-31 NOTE — Telephone Encounter (Signed)
Contacted patient to complete Transition of Care Assessment  Transition Care Management Follow-up Telephone Call  . Medicaid Managed Care Transition Call Status:MM [facility] Call Made  . Date of discharge and from where: [facility], 8/09/07/19  . How have you been since you were released from the hospital? "a little better"  . Any questions or concerns? No  Items Reviewed: [name] Did the pt receive and understand the discharge instructions provided? No  . Medications obtained and verified? Yes  . Any new allergies since your discharge? No  . Dietary orders reviewed?No . Do you have support at home? Yes, family  Functional Questionnaire: (I = Independent and D = Dependent)  ADLs: Independent Bathing/Dressing:Dependent Meal Prep: Independent Eating: Independent Maintaining continence: Independent Transferring/Ambulation: Independent Managing Meds: Independent   Follow up appointments reviewed:  PCP Hospital f/u appt confirmed? No , patient would like to be assigned a PCP  Yes, Scheduled to see Dr Lynnda Shields, [facility] on 04/23/20@ 1330; and Nurse Visit at [facility] for BP/Incision 04/02/20 at 1040  Are transportation arrangements needed? No   If their condition worsens, is the pt aware to call PCP or go to the EmergencyDept.? Yes  Was the patient provided with contact information for the PCP's office or ED? Yes  Was to pt encouraged to call back with questions or concerns? Yes  Order placed for [place], LCSW for symptoms/education, and due to previous TOC by LCSW on 03/24/20.  [name], RN, BSN, [place] Patient [place] [phone]

## 2020-04-02 ENCOUNTER — Ambulatory Visit: Payer: Medicaid Other

## 2020-04-02 VITALS — BP 136/82 | HR 91

## 2020-04-02 DIAGNOSIS — Z0130 Encounter for examination of blood pressure without abnormal findings: Secondary | ICD-10-CM

## 2020-04-02 NOTE — Progress Notes (Signed)
Pt is in the office for PP incision & BP check. Removed honeycomb dressing, incision appeared to be dry, clean and intact. Advised pt to keep area clean and dry, and to call if any signs of infection, pt agreed.  [name].Subjective:  Denise Moses is a 39 y.o. female here for BP check.   Hypertension ROS: Pt is taking medication as instructed, reports no abnormal symptoms.  Objective:  BP 136/82   Pulse 91   LMP 07/03/2019   Appearance alert, well appearing, and in no distress. General exam BP noted to be well controlled today in office.    Assessment:   Blood Pressure well controlled.   Plan:  Current treatment plan is effective, no change in therapy. PP visit is scheduled for 04-23-20.

## 2020-04-02 NOTE — Progress Notes (Signed)
I have reviewed this chart and agree with the RN/CMA assessment and management.    K. Meryl Milyn Stapleton, M.D. Attending [facility] (Faculty Practice)   

## 2020-04-08 ENCOUNTER — Other Ambulatory Visit: Payer: Self-pay

## 2020-04-08 DIAGNOSIS — F329 Major depressive disorder, single episode, unspecified: Secondary | ICD-10-CM

## 2020-04-08 DIAGNOSIS — F1210 Cannabis abuse, uncomplicated: Secondary | ICD-10-CM

## 2020-04-08 NOTE — Patient Outreach (Cosign Needed)
Care Coordination- Social Work  04/08/2020  Denise Moses 03/24/1981 [id number]  Subjective:    Denise Moses is an 39 y.o. year old female who is a primary patient of [facility], Pcp Not In.    Ms. Chaloux was given information about Medicaid Managed Care team care coordination services today. Denise Moses agreed to services and verbal consent obtained  Review of patient status, laboratory and other test data was performed as part of evaluation for provision of services.  SDOH:   SDOH Screenings   Alcohol Screen: Low Risk   . Last Alcohol Screening Score (AUDIT): 0  Depression (PHQ2-9): Low Risk   . PHQ-2 Score: 0  Financial Resource Strain: Low Risk   . Difficulty of Paying Living Expenses: Not hard at all  Food Insecurity: No Food Insecurity  . Worried About Charity fundraiser in the Last Year: Never true  . Ran Out of Food in the Last Year: Never true  Housing: Medium Risk  . Last Housing Risk Score: 1  Physical Activity:   . Days of Exercise per Week: Not on file  . Minutes of Exercise per Session: Not on file  Social Connections: Moderately Isolated  . Frequency of Communication with Friends and Family: More than three times a week  . Frequency of Social Gatherings with Friends and Family: More than three times a week  . Attends Religious Services: More than 4 times per year  . Active Member of Clubs or Organizations: No  . Attends Archivist Meetings: Never  . Marital Status: Never married  Stress: Stress Concern Present  . Feeling of Stress : To some extent  Tobacco Use: Medium Risk  . Smoking Tobacco Use: Former Smoker  . Smokeless Tobacco Use: Never Used  Transportation Needs: No Transportation Needs  . Lack of Transportation (Medical): No  . Lack of Transportation (Non-Medical): No   SDOH Interventions     Most Recent Value  SDOH Interventions  SDOH Interventions for the Following Domains Depression, Stress  Food Insecurity  Interventions Intervention Not Indicated  Financial Strain Interventions Intervention Not Indicated  Housing Interventions Other (Comment)  [Patient called the city and reported. Has talked to an attorney through the [employer]. Patient will continue working with the attorney.]  Intimate Partner Violence Interventions Intervention Not Indicated  Stress Interventions Provide Counseling, Offered [employer]  [Patient requested to be connected to therapist.]  Social Connections Interventions Intervention Not Indicated  Transportation Interventions Intervention Not Indicated  Alcohol Brief Interventions/Follow-up Brief Advice  Depression Interventions/Treatment  Counseling      Objective:    Medications:  Medications Reviewed Today    Reviewed by [name], LCSW (Social Worker) on 04/08/20 at [phone]  Med List Status: <None>  Medication Order Taking? Sig Documenting Provider Last Dose Status Informant  acetaminophen (TYLENOL) 325 MG tablet [id number] Yes Take 2 tablets (650 mg total) by mouth every 6 (six) hours as needed for mild pain. Denise Haymaker, MD Taking Active   coconut oil OIL [id number] Yes Apply 1 application topically as needed (nipple pain). Denise Settler, DO Taking Active   ibuprofen (ADVIL) 800 MG tablet [id number] Yes Take 1 tablet (800 mg total) by mouth every 8 (eight) hours as needed for moderate pain or cramping. Denise Haymaker, MD Taking Active   NIFEdipine (PROCARDIA XL/NIFEDICAL-XL) 90 MG 24 hr tablet [id number] Yes Take 1 tablet (90 mg total) by mouth daily. Denise Settler, DO Taking Active   oxyCODONE (OXY IR/ROXICODONE) 5 MG  immediate release tablet [id number] No Take 1 tablet (5 mg total) by mouth every 6 (six) hours as needed for up to 12 doses for severe pain.  Patient not taking: Reported on 04/02/2020   Denise Ngo, MD Not Taking Active   Prenat-FeAsp-Meth-FA-DHA w/o A (PRENATE PIXIE) 10-0.6-0.4-200 MG CAPS [id number] Yes  Take 1 tablet by mouth daily. Denise Bellman, MD Taking Active Self          Fall/Depression Screening:  Fall Risk  04/13/2016 03/21/2015 02/25/2014  Falls in the past year? No No No   PHQ 2/9 Scores 04/08/2020 07/20/2017 06/30/2017 06/09/2016 04/13/2016 12/23/2015 03/21/2015  PHQ - 2 Score 0 0 0 1 0 0 0  PHQ- 9 Score - 3 5 3  - - -    Assessment:  Goals Addressed              This Visit's Progress   .  "I want to attend therapy." (pt-stated)        [place] (see longitudinal plan of care for additional care plan information)  Current Barriers:  [name] Mental Health Concerns  . Substance abuse issues -  patient tested positive for marijuana while hospitalized during childbirth. She stated the baby's cord also tested positive. She has history of smoking marijuana at least every other day prior to giving birth, but denies smoking since giving birth. Denise Moses knowledge of community resource: for outpatient therapy.  Clinical Social Work Clinical Goal(s):  [name] Over the next 7-14 days, patient will work with [place] to address needs related to being scheduled with outpatient therapist.  . Over the next 30 days, patient will work with therapist and also work with [place] CPS to address need for therapy and substance abuse treatment. [name] LCSW will collaborate with DSS regarding patient's needs. [name] LCSW will collaborate with RN CM regarding patient's recent Cesarean section and post-partum needs.  Interventions: . Inter-disciplinary care team collaboration (see longitudinal plan of care) . LCSW will make referral to [place] to schedule appointment at [facility] for outpatient therapy at [phone]. [name] SDOH assessment performed. . Discussed importance of attending appointments with [place]. [name] LCSW provided education and support regarding substance abuse counseling services and encouraged patient to adhere to  a counselor's plan of care  Patient Self Care Activities:  . Patient will attend appointments as scheduled. . Patient will work with the Managed Medicaid team as scheduled. [name] LCSW will work with patient over the next 30 days to assist with scheduling appointment for outpatient therapy. . Patient verbalizes understanding of plan to be scheduled for outpatient therapy.  . Attends church or other social activities . Patient provided verbal consent for team to work with [place] CPS . Patient unable to independently schedule self for outpatient therapy due to lack of knowledge of community resources.  Initial goal documentation     .  "I want to live in a better place. It's overwhelming and it's bothering me." (pt-stated)        CARE PLAN ENTRY Medicaid Managed Care (see longitudinal plan of care for additional care plan information)  Current Barriers:  . Financial constraints related to unemployment due to having MS . Housing barriers . Lacks knowledge of community resource: doesn't know what else to do about mold and doesn't know where else she can move.  Clinical Social Work Clinical Goal(s):  [name] Over the next 30 days, patient will work with  SW to address concerns related to housing concerns and mold in the current home. . Over the next 7-14 days, patient will work with [place] to address needs related to locating appropriate housing . Over the next 30 days, patient will attend all scheduled medical appointments: with PCP and OB/GYN  Interventions: . Inter-disciplinary care team collaboration (see longitudinal plan of care) . Discussed ongoing collaboration and support from [facility] team, contact information provided. . Discussed patient's work with [employer] for assistance related to concerns with landlord and mold in rental home. Encouraged patient to collaborate closely as guided by the [facility] . LCSW will collaborate with [place] CPS to  discuss housing options.  Patient Self Care Activities:  . Patient will continue taking her medication as prescribed. [name] LCSW will follow-up with patient in 30 days regarding housing. [name] LCSW will contact [place] worker to discuss patient's case and needs (Patient provided verbal consent). . Patient verbalizes understanding of plan to work with the [facility] Managed Care Team to assist her with housing needs . Self administers medications as prescribed . Calls pharmacy for medication refills . Attends church or other social activities . Patient unable to independently  . Unable to independently maneuver through community resources regarding how to handle the mold in her current home and to find appropriate housing.  Initial goal documentation     .  COMPLETED: Quit smoking / using tobacco        Created May, 2017. Per assessment, patient no longer using tobacco.       Plan: A referral will be placed to [place] to schedule an outpatient therapy appointment at [facility] at [phone]. Note that patient prefers in-person appointment. Referral also to assist with housing needs.  A referral will be placed to RN CM to follow-up with patient regarding Cesarean section progression.   [name], MSW, LCSW Social Work Case Freight forwarder - [place]  Direct [place]: [phone]

## 2020-04-08 NOTE — Patient Instructions (Signed)
Denise Moses, It was a pleasure speaking with you today. Please see the information below. Members of our team will be contacting you to discuss the topics we discussed today.  Visit Information  Denise Moses was given information about Medicaid Managed Care team care coordination services and consented to engagement with the [facility] Managed Care team.   Goals Addressed              This Visit's Progress   .  "I want to attend therapy." (pt-stated)        CARE PLAN ENTRY Medicaid Managed Care (see longitudinal plan of care for additional care plan information)  Current Barriers:  [name] Mental Health Concerns  . Substance abuse issues -  patient tested positive for marijuana while hospitalized during childbirth. She stated the baby's cord also tested positive. She has history of smoking marijuana at least every other day prior to giving birth, but denies smoking since giving birth. Denise Moses knowledge of community resource: for outpatient therapy.  Clinical Social Work Clinical Goal(s):  [name] Over the next 7-14 days, patient will work with [facility] Guides to address needs related to being scheduled with outpatient therapist.  . Over the next 30 days, patient will work with therapist and also work with [place] DSS CPS to address need for therapy and substance abuse treatment. [name] LCSW will collaborate with DSS regarding patient's needs. [name] LCSW will collaborate with RN CM regarding patient's recent Cesarean section and post-partum needs.  Interventions: . Inter-disciplinary care team collaboration (see longitudinal plan of care) . LCSW will make referral to [facility] Guide to schedule appointment at [facility] for outpatient therapy at [phone]. [name] SDOH assessment performed. . Discussed importance of attending appointments with [place] DSS. [name] LCSW provided education and support regarding substance abuse counseling services and encouraged patient to adhere to a  counselor's plan of care  Patient Self Care Activities:  . Patient will attend appointments as scheduled. . Patient will work with the Managed Medicaid team as scheduled. [name] LCSW will work with patient over the next 30 days to assist with scheduling appointment for outpatient therapy. . Patient verbalizes understanding of plan to be scheduled for outpatient therapy.  . Attends church or other social activities . Patient provided verbal consent for team to work with [place] Moses DSS CPS . Patient unable to independently schedule self for outpatient therapy due to lack of knowledge of community resources.  Initial goal documentation     .  "I want to live in a better place. It's overwhelming and it's bothering me." (pt-stated)        CARE PLAN ENTRY Medicaid Managed Care (see longitudinal plan of care for additional care plan information)  Current Barriers:  . Financial constraints related to unemployment due to having MS . Housing barriers . Lacks knowledge of community resource: doesn't know what else to do about mold and doesn't know where else she can move.  Clinical Social Work Clinical Goal(s):  [name] Over the next 30 days, patient will work with SW to address concerns related to housing concerns and mold in the current home. . Over the next 7-14 days, patient will work with [facility] Guides to address needs related to locating appropriate housing . Over the next 30 days, patient will attend all scheduled medical appointments: with PCP and OB/GYN  Interventions: . Inter-disciplinary care team collaboration (see longitudinal plan of care) . Discussed ongoing collaboration and support from Managed Medicaid team, contact information  provided. . Discussed patient's work with [employer] for assistance related to concerns with landlord and mold in rental home. Encouraged patient to collaborate closely as guided by the [facility] . LCSW will collaborate with [place] CPS to  discuss housing options.  Patient Self Care Activities:  . Patient will continue taking her medication as prescribed. [name] LCSW will follow-up with patient in 30 days regarding housing. [name] LCSW will contact [place] worker to discuss patient's case and needs (Patient provided verbal consent). . Patient verbalizes understanding of plan to work with the [facility] Managed Care Team to assist her with housing needs . Self administers medications as prescribed . Calls pharmacy for medication refills . Attends church or other social activities . Patient unable to independently  . Unable to independently maneuver through community resources regarding how to handle the mold in her current home and to find appropriate housing.  Initial goal documentation     .  COMPLETED: Quit smoking / using tobacco        Created May, 2017. Per assessment, patient no longer using tobacco.       Please see education materials related to stress attached.    Managing Stress, Adult Feeling a certain amount of stress is normal. Stress helps our body and mind get ready to deal with the demands of life. Stress hormones can motivate you to do well at work and meet your responsibilities. However severe or long-lasting (chronic) stress can affect your mental and physical health. Chronic stress puts you at higher risk for anxiety, depression, and other health problems like digestive problems, muscle aches, heart disease, high blood pressure, and stroke. What are the causes? Common causes of stress include:  Demands from work, such as deadlines, feeling overworked, or having long hours.  Pressures at home, such as money issues, disagreements with a spouse, or parenting issues.  Pressures from major life changes, such as divorce, moving, loss of a loved one, or chronic illness. You may be at higher risk for stress-related problems if you do not get enough sleep, are in poor health, do not have emotional support,  or have a mental health disorder like anxiety or depression. How to recognize stress Stress can make you:  Have trouble sleeping.  Feel sad, anxious, irritable, or overwhelmed.  Lose your appetite.  Overeat or want to eat unhealthy foods.  Want to use drugs or alcohol. Stress can also cause physical symptoms, such as:  Sore, tense muscles, especially in the shoulders and neck.  Headaches.  Trouble breathing.  A faster heart rate.  Stomach pain, nausea, or vomiting.  Diarrhea or constipation.  Trouble concentrating. Follow these instructions at home: Lifestyle  Identify the source of your stress and your reaction to it. See a therapist who can help you change your reactions.  When there are stressful events: ? Talk about it with family, friends, or co-workers. ? Try to think realistically about stressful events and not ignore them or overreact. ? Try to find the positives in a stressful situation and not focus on the negatives. ? Cut back on responsibilities at work and home, if possible. Ask for help from friends or family members if you need it.  Find ways to cope with stress, such as: ? Meditation. ? Deep breathing. ? Yoga or tai chi. ? Progressive muscle relaxation. ? Doing art, playing music, or reading. ? Making time for fun activities. ? Spending time with family and friends.  Get support from  family, friends, or spiritual resources. Eating and drinking  Eat a healthy diet. This includes: ? Eating foods that are high in fiber, such as beans, whole grains, and fresh fruits and vegetables. ? Limiting foods that are high in fat and processed sugars, such as fried and sweet foods.  Do not skip meals or overeat.  Drink enough fluid to keep your urine pale yellow. Alcohol use  Do not drink alcohol if: ? Your health care provider tells you not to drink. ? You are pregnant, may be pregnant, or are planning to become pregnant.  Drinking alcohol is a way  some people try to ease their stress. This can be dangerous, so if you drink alcohol: ? Limit how much you use to:  0-1 drink a day for women.  0-2 drinks a day for men. ? Be aware of how much alcohol is in your drink. In the [place] one drink equals one 12 oz bottle of beer (355 mL), one 5 oz glass of wine (148 mL), or one 1 oz glass of hard liquor (44 mL). Activity   Include 30 minutes of exercise in your daily schedule. Exercise is a good stress reducer.  Include time in your day for an activity that you find relaxing. Try taking a walk, going on a bike ride, reading a book, or listening to music.  Schedule your time in a way that lowers stress, and keep a consistent schedule. Prioritize what is most important to get done. General instructions  Get enough sleep. Try to go to sleep and get up at about the same time every day.  Take over-the-counter and prescription medicines only as told by your health care provider.  Do not use any products that contain nicotine or tobacco, such as cigarettes, e-cigarettes, and chewing tobacco. If you need help quitting, ask your health care provider.  Do not use drugs or smoke to cope with stress.  Keep all follow-up visits as told by your health care provider. This is important. Where to find support  Talk with your health care provider about stress management or finding a support group.  Find a therapist to work with you on your stress management techniques. Contact a health care provider if:  Your stress symptoms get worse.  You are unable to manage your stress at home.  You are struggling to stop using drugs or alcohol. Get help right away if:  You may be a danger to yourself or others.  You have any thoughts of death or suicide. If you ever feel like you may hurt yourself or others, or have thoughts about taking your own life, get help right away. You can go to your nearest emergency department or call:  Your local emergency  services (911 in the [place]  A suicide crisis helpline, such as the [place] at 831-350-9099. This is open 24 hours a day. Summary  Feeling a certain amount of stress is normal, but severe or long-lasting (chronic) stress can affect your mental and physical health.  Chronic stress can put you at higher risk for anxiety, depression, and other health problems like digestive problems, muscle aches, heart disease, high blood pressure, and stroke.  You may be at higher risk for stress-related problems if you do not get enough sleep, are in poor health, lack emotional support, or have a mental health disorder like anxiety or depression.  Identify the source of your stress and your reaction to it. Try talking about stressful events with family,  friends, or co-workers, finding a Scientist, research (medical), or getting support from spiritual resources.  If you need more help, talk with your health care provider about finding a support group or a mental health therapist. This information is not intended to replace advice given to you by your health care provider. Make sure you discuss any questions you have with your health care provider. Document Revised: 02/14/2019 Document Reviewed: 02/14/2019 Elsevier Patient Education  [place].    Patient verbalizes understanding of instructions provided today.   The Managed Medicaid care management team will reach out to the patient again over the next 7 days.  The patient has been provided with contact information for the Managed Medicaid care management team and has been advised to call with any health related questions or concerns.  The Managed Medicaid care management team is available to follow up with the patient after provider conversation with the patient regarding recommendation for care management engagement and subsequent re-referral to the care management team.    [name], MSW, [place]: [phone]

## 2020-04-11 ENCOUNTER — Ambulatory Visit: Payer: Medicaid Other

## 2020-04-16 ENCOUNTER — Ambulatory Visit (INDEPENDENT_AMBULATORY_CARE_PROVIDER_SITE_OTHER): Payer: Medicaid Other | Admitting: Nurse Practitioner

## 2020-04-16 ENCOUNTER — Other Ambulatory Visit: Payer: Self-pay

## 2020-04-16 VITALS — BP 118/76 | HR 73 | Temp 97.3°F | Ht 66.0 in | Wt 240.0 lb

## 2020-04-16 DIAGNOSIS — L02412 Cutaneous abscess of left axilla: Secondary | ICD-10-CM | POA: Diagnosis not present

## 2020-04-16 DIAGNOSIS — O10919 Unspecified pre-existing hypertension complicating pregnancy, unspecified trimester: Secondary | ICD-10-CM | POA: Diagnosis not present

## 2020-04-16 DIAGNOSIS — L02411 Cutaneous abscess of right axilla: Secondary | ICD-10-CM

## 2020-04-16 NOTE — Patient Instructions (Addendum)
Post Partum:  Vital signs stable  Please keep upcoming appointment with OB/GYN  Will schedule PCP visit to establish care  Skin abscess:  Will place referral to dermatology  Will set up appointment to establish care with PCP   Skin Abscess  A skin abscess is an infected area of your skin that contains pus and other material. An abscess can happen in any part of your body. Some abscesses break open (rupture) on their own. Most continue to get worse unless they are treated. The infection can spread deeper into the body and into your blood, which can make you feel sick. A skin abscess is caused by germs that enter the skin through a cut or scrape. It can also be caused by blocked oil and sweat glands or infected hair follicles. This condition is usually treated by:  Draining the pus.  Taking antibiotic medicines.  Placing a warm, wet washcloth over the abscess. Follow these instructions at home: Medicines   Take over-the-counter and prescription medicines only as told by your doctor.  If you were prescribed an antibiotic medicine, take it as told by your doctor. Do not stop taking the antibiotic even if you start to feel better. Abscess care   If you have an abscess that has not drained, place a warm, clean, wet washcloth over the abscess several times a day. Do this as told by your doctor.  Follow instructions from your doctor about how to take care of your abscess. Make sure you: ? Cover the abscess with a bandage (dressing). ? Change your bandage or gauze as told by your doctor. ? Wash your hands with soap and water before you change the bandage or gauze. If you cannot use soap and water, use hand sanitizer.  Check your abscess every day for signs that the infection is getting worse. Check for: ? More redness, swelling, or pain. ? More fluid or blood. ? Warmth. ? More pus or a bad smell. General instructions  To avoid spreading the infection: ? Do not share personal  care items, towels, or hot tubs with others. ? Avoid making skin-to-skin contact with other people.  Keep all follow-up visits as told by your doctor. This is important. Contact a doctor if:  You have more redness, swelling, or pain around your abscess.  You have more fluid or blood coming from your abscess.  Your abscess feels warm when you touch it.  You have more pus or a bad smell coming from your abscess.  You have a fever.  Your muscles ache.  You have chills.  You feel sick. Get help right away if:  You have very bad (severe) pain.  You see red streaks on your skin spreading away from the abscess. Summary  A skin abscess is an infected area of your skin that contains pus and other material.  The abscess is caused by germs that enter the skin through a cut or scrape. It can also be caused by blocked oil and sweat glands or infected hair follicles.  Follow your doctor's instructions on caring for your abscess, taking medicines, preventing infections, and keeping follow-up visits. This information is not intended to replace advice given to you by your health care provider. Make sure you discuss any questions you have with your health care provider. Document Revised: 02/22/2019 Document Reviewed: 09/01/2017 Elsevier Patient Education  2020 [employer].

## 2020-04-16 NOTE — Patient Instructions (Signed)
Visit Information  Denise Moses was given information about Medicaid Managed care team care coordination services and consented to engagement with the [facility] Managed care team.   Goals Addressed              This Visit's Progress   .  "I want to attend therapy." (pt-stated)        [place] (see longitudinal plan of care for additional care plan information)  Current Barriers:  [name] Mental Health Concerns  . Substance abuse issues -  patient tested positive for marijuana while hospitalized during childbirth. She stated the baby's cord also tested positive. She has history of smoking marijuana at least every other day prior to giving birth, but denies smoking since giving birth. [name] knowledge of community resource: for outpatient therapy.  Clinical Social Work Clinical Goal(s):  [name] Over the next 7-14 days, patient will work with [place] to address needs related to being scheduled with outpatient therapist.  . Over the next 30 days, patient will work with therapist and also work with [place] CPS to address need for therapy and substance abuse treatment. [name] LCSW will collaborate with DSS regarding patient's needs. [name] LCSW will collaborate with RN CM regarding patient's recent Cesarean section and post-partum needs.  Interventions: . Inter-disciplinary care team collaboration (see longitudinal plan of care) . LCSW will make referral to [place] to schedule appointment at [facility] for outpatient therapy at [phone].  BSW Update: Assisted patient with scheduling appointment at [facility] on September 22 at 11 am. Provided education on how to contact office regarding any needed changes. [name] SDOH assessment performed. . Discussed importance of attending appointments with [facility]. [name] LCSW provided education and support regarding substance abuse counseling services and encouraged patient to adhere to a  counselor's plan of care  Patient Self Care Activities:  . Patient will attend appointments as scheduled. . Patient will work with the Managed Medicaid team as scheduled. [name] LCSW will work with patient over the next 30 days to assist with scheduling appointment for outpatient therapy. . Patient verbalizes understanding of plan to be scheduled for outpatient therapy.  . Attends church or other social activities . Patient provided verbal consent for team to work with [place] CPS . Patient unable to independently schedule self for outpatient therapy due to lack of knowledge of community resources.  Please see past updates related to this goal by clicking on the "Past Updates" button in the selected goal       Follow up Plan: CM Team will outreach to patient by telephone over the next 14 days.  Mickel Fuchs, Arita Miss, [place] Managed Medicaid Team [phone]

## 2020-04-16 NOTE — Progress Notes (Signed)
@Patient  ID: Denise Moses, female    DOB: [birth year], 39 y.o.   MRN: [id number]  Chief Complaint  Patient presents with  . Hospitalization Follow-up    Recently had a baby; here for [facility] has concerns about recurring abcess under arm.     Referring provider: No ref. provider found   39 year old female with history of chronic hypertension affecting pregnancy, obesity, multiple sclerosis.  Recent cesarean delivery on 03/23/2020.  HPI  Patient presents for transition of care visit today.  She recently had a C-section on 03/23/2020.  She did have hypertension affecting her pregnancy.  She states that her blood pressure has been normal since discharge home from the hospital.  She states that her and the baby have been doing well.  She is not breast-feeding.  She does complain today of abscesses to bilateral axilla. She has a history of these abscesses. She states that she usually goes to UC to have them drained. Patient does need a PCP set up.  Patient does have an upcoming appointment scheduled with OB/GYN for postpartum visit.  Denies f/c/s, n/v/d, hemoptysis, PND, chest pain or edema.      No Known Allergies  Immunization History  Administered Date(s) Administered  . Tdap 03/02/2020    Past Medical History:  Diagnosis Date  . Abnormal MRI, cervical spine   . Anemia   . Elective abortion   . Heart murmur   . Herpes   . Infection    UTI  . Pregnancy induced hypertension    2001  . Vitamin D deficiency     Tobacco History: Social History   Tobacco Use  Smoking Status Former Smoker  . Packs/day: 0.25  . Years: 10.00  . Pack years: 2.50  . Types: Cigarettes  . Quit date: 03/2019  . Years since quitting: 1.1  Smokeless Tobacco Never Used  Tobacco Comment   2-3 cigs/day   Counseling given: Yes Comment: 2-3 cigs/day   Outpatient Encounter Medications as of 04/16/2020  Medication Sig  . acetaminophen (TYLENOL) 325 MG tablet Take 2 tablets (650 mg total) by  mouth every 6 (six) hours as needed for mild pain.  . coconut oil OIL Apply 1 application topically as needed (nipple pain).  [name] ibuprofen (ADVIL) 800 MG tablet Take 1 tablet (800 mg total) by mouth every 8 (eight) hours as needed for moderate pain or cramping.  [name] NIFEdipine (PROCARDIA XL/NIFEDICAL-XL) 90 MG 24 hr tablet Take 1 tablet (90 mg total) by mouth daily.  [name] oxyCODONE (OXY IR/ROXICODONE) 5 MG immediate release tablet Take 1 tablet (5 mg total) by mouth every 6 (six) hours as needed for up to 12 doses for severe pain. (Patient not taking: Reported on 04/02/2020)  . Prenat-FeAsp-Meth-FA-DHA w/o A (PRENATE PIXIE) 10-0.6-0.4-200 MG CAPS Take 1 tablet by mouth daily. (Patient not taking: Reported on 04/16/2020)   No facility-administered encounter medications on file as of 04/16/2020.     Review of Systems  Review of Systems  Constitutional: Negative.  Negative for fatigue and fever.  HENT: Negative.   Respiratory: Negative for cough and shortness of breath.   Cardiovascular: Negative.   Gastrointestinal: Negative.   Skin:       Abscess to bilateral axilla   Allergic/Immunologic: Negative.   Neurological: Negative.   Psychiatric/Behavioral: Negative.        Physical Exam  BP 118/76 (BP Location: Right Arm)   Pulse 73   Temp (!) 97.3 F (36.3 C)   Ht 5\' 6"  (1.676 m)  Wt 240 lb (108.9 kg)   LMP 07/03/2019   SpO2 100%   BMI 38.74 kg/m   Wt Readings from Last 5 Encounters:  04/16/20 240 lb (108.9 kg)  03/21/20 268 lb 9.6 oz (121.8 kg)  03/19/20 266 lb 12.1 oz (121 kg)  03/10/20 267 lb (121.1 kg)  02/27/20 (!) 263 lb (119.3 kg)     Physical Exam Vitals and nursing note reviewed.  Constitutional:      General: She is not in acute distress.    Appearance: She is well-developed.  Cardiovascular:     Rate and Rhythm: Normal rate and regular rhythm.  Pulmonary:     Effort: Pulmonary effort is normal.     Breath sounds: Normal breath sounds.  Skin:    Findings:  Abscess (bilateral axilla ) present.  Neurological:     Mental Status: She is alert and oriented to person, place, and time.       Imaging: [place] MFM FETAL BPP WO NON STRESS  Result Date: 03/21/2020 ----------------------------------------------------------------------  OBSTETRICS REPORT                       (Signed Final 03/21/2020 11:57 am) ---------------------------------------------------------------------- Patient Info  ID #:       [id number]                          D.O.B.:  Jul 17, 1981 (39 yrs)  Name:       Denise Moses               Visit Date: 03/21/2020 10:54 am ---------------------------------------------------------------------- Performed By  Attending:        Sander Nephew      Ref. Address:     [place]                                                             [address] [place]                                                             [place]  Performed By:     Corky Crafts             [facility]  [facility]                                                             [facility]                                                             [facility]  Referred By:      [facility] ---------------------------------------------------------------------- Orders  #  Description                           Code        Ordered By  1  [place] MFM FETAL BPP WO NON               76819.01    RAVI [facility]     STRESS ----------------------------------------------------------------------  #  Order #                     Accession #                Episode #  1  [id number]                   [id number]                 [id number] ---------------------------------------------------------------------- Indications  Hypertension - Chronic/Pre-existing            O10.019   (labetalol)  [redacted] weeks gestation of pregnancy                Z3A.66  Advanced maternal age multigravida 2+,        O51.523  third trimester  Obesity complicating pregnancy, third          O99.213  trimester (pregravid BMI 34)  Multiple sclerosis affecting pregnancy         O99.350 G35  Low risk NIPS, 6.6FF  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52          1.63          3.66           2.96 ---------------------------------------------------------------------- Biophysical Evaluation  Amniotic F.V:   Within normal limits       F. Tone:        Observed  F. Movement:    Observed                   Score:          8/8  F. Breathing:   Observed ---------------------------------------------------------------------- OB History  Gravidity:    12        Term:   4        Prem:   1        SAB:   2  TOP:          3       Ectopic:  1        Living: 5 ---------------------------------------------------------------------- Gestational Age  LMP:           37w 3d        Date:  07/03/19                 EDD:   04/08/20  Best:          37w 3d     Det. By:  LMP  (07/03/19)          EDD:   04/08/20 ---------------------------------------------------------------------- Cervix Uterus Adnexa  Cervix  Not visualized (advanced GA >24wks) ---------------------------------------------------------------------- Impression  Antenatal testing for chronic  hypertension  Biophysical profile 8/8 with good fetal movement and  amniotic fluid.  Ms. Delisi is not feeling well today she has a had a mild  headache with tylenol. Her blood pressure was elevated  today with 156/88 and 153/86 mmHg.  She is planning for a delivery at 38 weeks.  I discussed with Ms. Narciso my recommendation to move  toward delivery given her AMA status, elevated blood  pressure, headache and overall feeling poorly.  I discussed the plan of care with Dr. Ilda Basset. ---------------------------------------------------------------------- Recommendations  To L&D for delivery. ----------------------------------------------------------------------               Sander Nephew, MD Electronically Signed Final Report   03/21/2020 11:57 am ----------------------------------------------------------------------    Assessment & Plan:   Chronic hypertension affecting pregnancy Vital signs stable  Please keep upcoming appointment with OB/GYN  Will schedule PCP visit to establish care  Skin abscess:  Will place referral to dermatology  Will set up appointment to establish care with PCP      Fenton Foy, NP 04/17/2020

## 2020-04-16 NOTE — Patient Outreach (Signed)
Care Coordination- Social Work  04/16/2020  Denise Moses [birth year] [id number]  Subjective:    Denise Moses is an 39 y.o. year old female who is a primary patient of Pcp, No.    Denise Moses was given information about Medicaid Managed Care team care coordination services today. Denise Moses agreed to services and verbal consent obtained  Review of patient status, laboratory and other test data was performed as part of evaluation for provision of services.    Objective:    Medications:  Medications Reviewed Today    Reviewed by Thressa Sheller (Medical Assistant) on 04/16/20 at 62  Med List Status: <None>  Medication Order Taking? Sig Documenting Provider Last Dose Status Informant  acetaminophen (TYLENOL) 325 MG tablet [id number] Yes Take 2 tablets (650 mg total) by mouth every 6 (six) hours as needed for mild pain. [name], MD Taking Active   coconut oil OIL [id number] Yes Apply 1 application topically as needed (nipple pain). [name], DO Taking Active   ibuprofen (ADVIL) 800 MG tablet [id number] Yes Take 1 tablet (800 mg total) by mouth every 8 (eight) hours as needed for moderate pain or cramping. [name], MD Taking Active   NIFEdipine (PROCARDIA XL/NIFEDICAL-XL) 90 MG 24 hr tablet [id number] Yes Take 1 tablet (90 mg total) by mouth daily. [name], DO Taking Active   oxyCODONE (OXY IR/ROXICODONE) 5 MG immediate release tablet [id number] No Take 1 tablet (5 mg total) by mouth every 6 (six) hours as needed for up to 12 doses for severe pain.  Patient not taking: Reported on 04/02/2020   [name], MD Not Taking Active   Prenat-FeAsp-Meth-FA-DHA w/o A (PRENATE PIXIE) 10-0.6-0.4-200 MG CAPS [id number] No Take 1 tablet by mouth daily.  Patient not taking: Reported on 04/16/2020   [name], MD Not Taking Active Self          Fall/Depression Screening:  Fall Risk  04/13/2016 03/21/2015 02/25/2014  Falls in the past  year? No No No   PHQ 2/9 Scores 04/08/2020 07/20/2017 06/30/2017 06/09/2016 04/13/2016 12/23/2015 03/21/2015  PHQ - 2 Score 0 0 0 1 0 0 0  PHQ- 9 Score - 3 5 3  - - -    Assessment:  Goals Addressed              This Visit's Progress   .  "I want to attend therapy." (pt-stated)        Denise Moses (see longitudinal plan of care for additional care plan information)  Current Barriers:  [name] Mental Health Concerns  . Substance abuse issues -  patient tested positive for marijuana while hospitalized during childbirth. She stated the baby's cord also tested positive. She has history of smoking marijuana at least every other day prior to giving birth, but denies smoking since giving birth. Denise Moses knowledge of community resource: for outpatient therapy.  Clinical Social Work Clinical Goal(s):  [name] Over the next 7-14 days, patient will work with [facility] to address needs related to being scheduled with outpatient therapist.  . Over the next 30 days, patient will work with therapist and also work with [place] CPS to address need for therapy and substance abuse treatment. [name] LCSW will collaborate with DSS regarding patient's needs. [name] LCSW will collaborate with RN CM regarding patient's recent Cesarean section and post-partum needs.  Interventions: . Inter-disciplinary care team collaboration (see longitudinal plan of care) . LCSW will make referral to [facility]  Guide to schedule appointment at [facility] for outpatient therapy at [phone].  BSW Update: Assisted patient with scheduling appointment at [facility] on September 22 at 11 am. Provided education on how to contact office regarding any needed changes. [name] SDOH assessment performed. . Discussed importance of attending appointments with [facility]. [name] LCSW provided education and support regarding substance abuse counseling services and encouraged patient to adhere to a  counselor's plan of care  Patient Self Care Activities:  . Patient will attend appointments as scheduled. . Patient will work with the Managed Medicaid team as scheduled. [name] LCSW will work with patient over the next 30 days to assist with scheduling appointment for outpatient therapy. . Patient verbalizes understanding of plan to be scheduled for outpatient therapy.  . Attends church or other social activities . Patient provided verbal consent for team to work with [place] CPS . Patient unable to independently schedule self for outpatient therapy due to lack of knowledge of community resources.  Please see past updates related to this goal by clicking on the "Past Updates" button in the selected goal         Plan:  BSW will reach out to patient over the next 14 days by phone to follow up on appointment.  Denise Moses, Arita Miss, [place] Managed Medicaid Team [phone]

## 2020-04-17 NOTE — Assessment & Plan Note (Signed)
Vital signs stable  Please keep upcoming appointment with OB/GYN  Will schedule PCP visit to establish care  Skin abscess:  Will place referral to dermatology  Will set up appointment to establish care with PCP

## 2020-04-23 ENCOUNTER — Ambulatory Visit: Payer: Medicaid Other | Admitting: Obstetrics and Gynecology

## 2020-04-28 ENCOUNTER — Other Ambulatory Visit: Payer: Self-pay

## 2020-04-28 NOTE — Patient Outreach (Signed)
Care Coordination  04/28/2020  Stephine Langbehn 09-May-1981 [id number]  An unsuccessful telephone outreach was attempted today. The patient was referred to the case management team for assistance with care management and care coordination.   Follow Up Plan: Social Worker will follow up in 7 days to follow up with therapy appointment patient had on 04/23/2020.[name]   Mickel Fuchs, BSW, [place] Managed Medicaid Team  [phone]

## 2020-04-28 NOTE — Patient Instructions (Addendum)
° °  Visit Information  Ms. Denise Moses  - as a part of your Medicaid benefit, you are eligible for care management and care coordination services at no cost or copay. I was unable to reach you by phone today but would be happy to help you with your health related needs. Please feel free to call me at [phone].   A member of the Managed Medicaid care management team will reach out to you again over the next 7 days.   Mickel Fuchs, BSW, [place]  High Risk Managed Medicaid Team

## 2020-04-30 ENCOUNTER — Ambulatory Visit: Payer: Medicaid Other | Admitting: Obstetrics and Gynecology

## 2020-05-01 ENCOUNTER — Ambulatory Visit: Payer: Self-pay | Admitting: Nurse Practitioner

## 2020-05-05 ENCOUNTER — Other Ambulatory Visit: Payer: Self-pay

## 2020-05-05 NOTE — Patient Outreach (Signed)
Care Coordination  05/05/2020  Emmilee Reamer 04/23/1981 [id number]  Third unsuccessful telephone outreach was attempted today. The patient was referred to the case management team for assistance with care management and care coordination. The patient's primary care provider has been notified of our unsuccessful attempts to make or maintain contact with the patient. The care management team is pleased to engage with this patient at any time in the future should he/she be interested in assistance from the care management team.   Follow Up Plan: No further follow up required: for this patient.  Mickel Fuchs, BSW, [place]  High Risk Managed Medicaid Team

## 2020-05-07 ENCOUNTER — Ambulatory Visit (INDEPENDENT_AMBULATORY_CARE_PROVIDER_SITE_OTHER): Payer: Medicaid Other | Admitting: Nurse Practitioner

## 2020-05-07 ENCOUNTER — Encounter: Payer: Self-pay | Admitting: Nurse Practitioner

## 2020-05-07 ENCOUNTER — Other Ambulatory Visit: Payer: Self-pay

## 2020-05-07 VITALS — BP 122/55 | HR 72 | Temp 98.0°F | Ht 66.0 in | Wt 236.0 lb

## 2020-05-07 DIAGNOSIS — Z1231 Encounter for screening mammogram for malignant neoplasm of breast: Secondary | ICD-10-CM | POA: Diagnosis not present

## 2020-05-07 DIAGNOSIS — I158 Other secondary hypertension: Secondary | ICD-10-CM | POA: Diagnosis not present

## 2020-05-07 DIAGNOSIS — D72829 Elevated white blood cell count, unspecified: Secondary | ICD-10-CM | POA: Diagnosis not present

## 2020-05-07 DIAGNOSIS — E876 Hypokalemia: Secondary | ICD-10-CM

## 2020-05-07 DIAGNOSIS — D509 Iron deficiency anemia, unspecified: Secondary | ICD-10-CM | POA: Diagnosis not present

## 2020-05-07 NOTE — Progress Notes (Signed)
[place] [place], [place]  [id number] Phone:  [phone]   Fax:  [phone]   New Patient Office Visit  Subjective:  Patient ID: Denise Moses, female    DOB: 1981/06/24  Age: 39 y.o. MRN: [id number]  CC:  Chief Complaint  Patient presents with   Establish Care    HPI Denise Moses presents to establish care. She  has a past medical history of Abnormal MRI, cervical spine, Anemia, Elective abortion, Heart murmur, Herpes, Infection, Pregnancy induced hypertension, and Vitamin D deficiency.   She is in today to establish care.  She has a history of hypertension in pregnancy.  She is currently on nifedipine 90 mg daily.  She admits that she is doing well along with her son.  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  Denies headache, dizziness, visual changes, shortness of breath, dyspnea on exertion, chest pain, nausea, vomiting or any edema.   Past Medical History:  Diagnosis Date   Abnormal MRI, cervical spine    Anemia    Elective abortion    Heart murmur    Herpes    Infection    UTI   Pregnancy induced hypertension    2001   Vitamin D deficiency     Past Surgical History:  Procedure Laterality Date   CESAREAN SECTION N/A 03/23/2020   Procedure: CESAREAN SECTION;  Surgeon: Jonnie Kind, MD;  Location: [facility];  Service: Obstetrics;  Laterality: N/A;  Arrest of Dilation   CHOLECYSTECTOMY     LAPAROSCOPY N/A 06/09/2016   Procedure: LAPAROSCOPY OPERATIVE;  Surgeon: Osborne Oman, MD;  Location: [facility];  Service: Gynecology;  Laterality: N/A;  ectopic    THERAPEUTIC ABORTION     x 4   UNILATERAL SALPINGECTOMY Left 06/09/2016   Procedure: UNILATERAL SALPINGECTOMY, REMOVAL OF IUD;  Surgeon: Osborne Oman, MD;  Location: [facility];  Service: Gynecology;  Laterality: Left;   WISDOM TOOTH EXTRACTION      Family History  Problem Relation Age of Onset   Breast cancer  Mother 72       was a smoker, alcohol drinker in early life    Breast cancer Maternal Aunt    Cancer Maternal Grandfather        unsure of type   Other Father        unsure of history   Breast cancer Maternal Aunt     Social History   Socioeconomic History   Marital status: Single    Spouse name: Not on file   Number of children: 5   Years of education: college   Highest education level: Not on file  Occupational History   Occupation: Unemployed  Tobacco Use   Smoking status: Former Smoker    Packs/day: 0.25    Years: 10.00    Pack years: 2.50    Types: Cigarettes    Quit date: 03/2019    Years since quitting: 1.1   Smokeless tobacco: Never Used   Tobacco comment: 2-3 cigs/day  Vaping Use   Vaping Use: Never used  Substance and Sexual Activity   Alcohol use: Not Currently    Comment: social drinker   Drug use: Yes    Frequency: 5.0 times per week    Types: Marijuana    Comment: last used before delivery   Sexual activity: Yes    Partners: Male    Birth control/protection: Surgical  Other Topics Concern  Not on file  Social History Narrative   Lives at home with children.   Right-handed.   No daily caffeine use.   Social Determinants of Health   Financial Resource Strain: Low Risk    Difficulty of Paying Living Expenses: Not hard at all  Food Insecurity: No Food Insecurity   Worried About Charity fundraiser in the Last Year: Never true   [place] in the Last Year: Never true  Transportation Needs: No Transportation Needs   Lack of Transportation (Medical): No   Lack of Transportation (Non-Medical): No  Physical Activity:    Days of Exercise per Week: Not on file   Minutes of Exercise per Session: Not on file  Stress: Stress Concern Present   Feeling of Stress : To some extent  Social Connections: Moderately Isolated   Frequency of Communication with Friends and Family: More than three times a week   Frequency of  Social Gatherings with Friends and Family: More than three times a week   Attends Religious Services: More than 4 times per year   Active Member of [employer] or Organizations: No   Attends Archivist Meetings: Never   Marital Status: Never married  Human resources officer Violence: Not At Risk   Fear of Current or Ex-Partner: No   Emotionally Abused: No   Physically Abused: No   Sexually Abused: No    ROS Review of Systems  Constitutional: Negative for appetite change, chills and fever.  HENT: Negative.   Eyes: Negative.   Respiratory: Negative.  Negative for shortness of breath.   Cardiovascular: Negative for chest pain, palpitations and leg swelling.  Gastrointestinal: Negative.   Endocrine: Negative.   Genitourinary: Negative.   Musculoskeletal: Negative.   Skin: Negative.   Neurological: Negative.  Negative for dizziness and headaches.  Hematological: Negative.   Psychiatric/Behavioral:       Good mood  Doing better than she thought She admits that her team is supportive.   All other systems reviewed and are negative.   Objective:   Today's Vitals: BP (!) 122/55    Pulse 72    Temp 98 F (36.7 C)    Ht 5\' 6"  (1.676 m)    Wt 236 lb (107 kg)    LMP 07/03/2019    SpO2 100%    BMI 38.09 kg/m   Physical Exam Constitutional:      General: She is not in acute distress.    Appearance: She is obese. She is not ill-appearing or toxic-appearing.  HENT:     Head: Atraumatic.     Nose: Nose normal.     Mouth/Throat:     Mouth: Mucous membranes are moist.  Cardiovascular:     Rate and Rhythm: Normal rate and regular rhythm.     Pulses: Normal pulses.     Heart sounds: Normal heart sounds.  Pulmonary:     Effort: Pulmonary effort is normal.     Breath sounds: Normal breath sounds.  Abdominal:     General: Bowel sounds are normal.     Palpations: Abdomen is soft.     Tenderness: There is no abdominal tenderness.     Comments: Increased abdominal girth    Musculoskeletal:        General: Normal range of motion.     Cervical back: Normal range of motion.  Skin:    General: Skin is warm and dry.     Capillary Refill: Capillary refill takes less than 2 seconds.  Neurological:     General: No focal deficit present.     Mental Status: She is alert and oriented to person, place, and time.  Psychiatric:        Mood and Affect: Mood normal.        Behavior: Behavior normal.        Thought Content: Thought content normal.        Judgment: Judgment normal.     Assessment & Plan:   Problem List Items Addressed This Visit      Other   Iron deficiency anemia Encourage patient to continue with iron therapy least 3 months after giving birth   Relevant Orders   CBC with Differential/Platelet (Completed)    Other Visit Diagnoses    Hypokalemia    -  Primary   Relevant Orders   Comp. Metabolic Panel (12) (Completed)   Other secondary hypertension     Encouraged on going compliance with current medication regimen Encouraged home monitoring and recording BP <130/80 Eating a heart-healthy diet with less salt Encouraged regular physical activity  Recommend Weight loss   Relevant Medications   NIFEdipine (PROCARDIA XL/NIFEDICAL-XL) 90 MG 24 hr tablet   Leukocytosis, unspecified type       Relevant Orders   CBC with Differential/Platelet (Completed)   Encounter for screening mammogram for malignant neoplasm of breast       Relevant Orders   MM 3D SCREEN BREAST BILATERAL      Outpatient Encounter Medications as of 05/07/2020  Medication Sig   NIFEdipine (PROCARDIA XL/NIFEDICAL-XL) 90 MG 24 hr tablet Take 1 tablet (90 mg total) by mouth daily.   [DISCONTINUED] NIFEdipine (PROCARDIA XL/NIFEDICAL-XL) 90 MG 24 hr tablet Take 1 tablet (90 mg total) by mouth daily.   acetaminophen (TYLENOL) 325 MG tablet Take 2 tablets (650 mg total) by mouth every 6 (six) hours as needed for mild pain. (Patient not taking: Reported on 05/07/2020)   coconut  oil OIL Apply 1 application topically as needed (nipple pain). (Patient not taking: Reported on 05/07/2020)   Prenat-FeAsp-Meth-FA-DHA w/o A (PRENATE PIXIE) 10-0.6-0.4-200 MG CAPS Take 1 tablet by mouth daily. (Patient not taking: Reported on 04/16/2020)   [DISCONTINUED] ibuprofen (ADVIL) 800 MG tablet Take 1 tablet (800 mg total) by mouth every 8 (eight) hours as needed for moderate pain or cramping. (Patient not taking: Reported on 05/07/2020)   [DISCONTINUED] oxyCODONE (OXY IR/ROXICODONE) 5 MG immediate release tablet Take 1 tablet (5 mg total) by mouth every 6 (six) hours as needed for up to 12 doses for severe pain. (Patient not taking: Reported on 04/02/2020)   No facility-administered encounter medications on file as of 05/07/2020.    Follow-up: Return in about 3 months (around 08/07/2020).   Vevelyn Francois, NP

## 2020-05-07 NOTE — Patient Instructions (Signed)
   Managing Your Hypertension Hypertension is commonly called high blood pressure. This is when the force of your blood pressing against the walls of your arteries is too strong. Arteries are blood vessels that carry blood from your heart throughout your body. Hypertension forces the heart to work harder to pump blood, and may cause the arteries to become narrow or stiff. Having untreated or uncontrolled hypertension can cause heart attack, stroke, kidney disease, and other problems. What are blood pressure readings? A blood pressure reading consists of a higher number over a lower number. Ideally, your blood pressure should be below 120/80. The first ("top") number is called the systolic pressure. It is a measure of the pressure in your arteries as your heart beats. The second ("bottom") number is called the diastolic pressure. It is a measure of the pressure in your arteries as the heart relaxes. What does my blood pressure reading mean? Blood pressure is classified into four stages. Based on your blood pressure reading, your health care provider may use the following stages to determine what type of treatment you need, if any. Systolic pressure and diastolic pressure are measured in a unit called mm Hg. Normal  Systolic pressure: below 120.  Diastolic pressure: below 80. Elevated  Systolic pressure: 120-129.  Diastolic pressure: below 80. Hypertension stage 1  Systolic pressure: 130-139.  Diastolic pressure: 80-89. Hypertension stage 2  Systolic pressure: 140 or above.  Diastolic pressure: 90 or above. What health risks are associated with hypertension? Managing your hypertension is an important responsibility. Uncontrolled hypertension can lead to:  A heart attack.  A stroke.  A weakened blood vessel (aneurysm).  Heart failure.  Kidney damage.  Eye damage.  Metabolic syndrome.  Memory and concentration problems. What changes can I make to manage my  hypertension? Hypertension can be managed by making lifestyle changes and possibly by taking medicines. Your health care provider will help you make a plan to bring your blood pressure within a normal range. Eating and drinking   Eat a diet that is high in fiber and potassium, and low in salt (sodium), added sugar, and fat. An example eating plan is called the DASH (Dietary Approaches to Stop Hypertension) diet. To eat this way: ? Eat plenty of fresh fruits and vegetables. Try to fill half of your plate at each meal with fruits and vegetables. ? Eat whole grains, such as whole wheat pasta, brown rice, or whole grain bread. Fill about one quarter of your plate with whole grains. ? Eat low-fat diary products. ? Avoid fatty cuts of meat, processed or cured meats, and poultry with skin. Fill about one quarter of your plate with lean proteins such as fish, chicken without skin, beans, eggs, and tofu. ? Avoid premade and processed foods. These tend to be higher in sodium, added sugar, and fat.  Reduce your daily sodium intake. Most people with hypertension should eat less than 1,500 mg of sodium a day.  Limit alcohol intake to no more than 1 drink a day for nonpregnant women and 2 drinks a day for men. One drink equals 12 oz of beer, 5 oz of wine, or 1 oz of hard liquor. Lifestyle  Work with your health care provider to maintain a healthy body weight, or to lose weight. Ask what an ideal weight is for you.  Get at least 30 minutes of exercise that causes your heart to beat faster (aerobic exercise) most days of the week. Activities may include walking, swimming, or biking.    Include exercise to strengthen your muscles (resistance exercise), such as weight lifting, as part of your weekly exercise routine. Try to do these types of exercises for 30 minutes at least 3 days a week.  Do not use any products that contain nicotine or tobacco, such as cigarettes and e-cigarettes. If you need help quitting,  ask your health care provider.  Control any long-term (chronic) conditions you have, such as high cholesterol or diabetes. Monitoring  Monitor your blood pressure at home as told by your health care provider. Your personal target blood pressure may vary depending on your medical conditions, your age, and other factors.  Have your blood pressure checked regularly, as often as told by your health care provider. Working with your health care provider  Review all the medicines you take with your health care provider because there may be side effects or interactions.  Talk with your health care provider about your diet, exercise habits, and other lifestyle factors that may be contributing to hypertension.  Visit your health care provider regularly. Your health care provider can help you create and adjust your plan for managing hypertension. Will I need medicine to control my blood pressure? Your health care provider may prescribe medicine if lifestyle changes are not enough to get your blood pressure under control, and if:  Your systolic blood pressure is 130 or higher.  Your diastolic blood pressure is 80 or higher. Take medicines only as told by your health care provider. Follow the directions carefully. Blood pressure medicines must be taken as prescribed. The medicine does not work as well when you skip doses. Skipping doses also puts you at risk for problems. Contact a health care provider if:  You think you are having a reaction to medicines you have taken.  You have repeated (recurrent) headaches.  You feel dizzy.  You have swelling in your ankles.  You have trouble with your vision. Get help right away if:  You develop a severe headache or confusion.  You have unusual weakness or numbness, or you feel faint.  You have severe pain in your chest or abdomen.  You vomit repeatedly.  You have trouble breathing. Summary  Hypertension is when the force of blood pumping  through your arteries is too strong. If this condition is not controlled, it may put you at risk for serious complications.  Your personal target blood pressure may vary depending on your medical conditions, your age, and other factors. For most people, a normal blood pressure is less than 120/80.  Hypertension is managed by lifestyle changes, medicines, or both. Lifestyle changes include weight loss, eating a healthy, low-sodium diet, exercising more, and limiting alcohol. This information is not intended to replace advice given to you by your health care provider. Make sure you discuss any questions you have with your health care provider. Document Revised: 11/10/2018 Document Reviewed: 06/16/2016 Elsevier Patient Education  2020 Elsevier Inc.  

## 2020-05-07 NOTE — Progress Notes (Signed)
0

## 2020-05-08 LAB — COMP. METABOLIC PANEL (12)
AST: 13 IU/L (ref 0–40)
Albumin/Globulin Ratio: 1.6 (ref 1.2–2.2)
Albumin: 4.5 g/dL (ref 3.8–4.8)
Alkaline Phosphatase: 121 IU/L (ref 44–121)
BUN/Creatinine Ratio: 10 (ref 9–23)
BUN: 8 mg/dL (ref 6–24)
Bilirubin Total: <0.2 mg/dL (ref 0.0–1.2)
Calcium: 9.5 mg/dL (ref 8.7–10.2)
Chloride: 105 mmol/L (ref 96–106)
Creatinine, Ser: 0.83 mg/dL (ref 0.57–1.00)
GFR calc Af Amer: 102 mL/min/{1.73_m2} (ref >59)
GFR calc non Af Amer: 88 mL/min/{1.73_m2} (ref >59)
Globulin, Total: 2.8 g/dL (ref 1.5–4.5)
Glucose: 81 mg/dL (ref 65–99)
Potassium: 4.3 mmol/L (ref 3.5–5.2)
Sodium: 140 mmol/L (ref 134–144)
Total Protein: 7.3 g/dL (ref 6.0–8.5)

## 2020-05-08 LAB — CBC WITH DIFFERENTIAL/PLATELET
Basophils Absolute: 0.1 10*3/uL (ref 0.0–0.2)
Basos: 1 %
EOS (ABSOLUTE): 0.4 10*3/uL (ref 0.0–0.4)
Eos: 5 %
Hematocrit: 34.9 % (ref 34.0–46.6)
Hemoglobin: 10.7 g/dL — ABNORMAL LOW (ref 11.1–15.9)
Immature Grans (Abs): 0 10*3/uL (ref 0.0–0.1)
Immature Granulocytes: 0 %
Lymphocytes Absolute: 3.3 10*3/uL — ABNORMAL HIGH (ref 0.7–3.1)
Lymphs: 41 %
MCH: 26.1 pg — ABNORMAL LOW (ref 26.6–33.0)
MCHC: 30.7 g/dL — ABNORMAL LOW (ref 31.5–35.7)
MCV: 85 fL (ref 79–97)
Monocytes Absolute: 0.4 10*3/uL (ref 0.1–0.9)
Monocytes: 4 %
Neutrophils Absolute: 3.8 10*3/uL (ref 1.4–7.0)
Neutrophils: 49 %
Platelets: 312 10*3/uL (ref 150–450)
RBC: 4.1 x10E6/uL (ref 3.77–5.28)
RDW: 18.2 % — ABNORMAL HIGH (ref 11.7–15.4)
WBC: 7.9 10*3/uL (ref 3.4–10.8)

## 2020-05-08 MED ORDER — NIFEDIPINE ER OSMOTIC RELEASE 90 MG PO TB24: 90.0000 mg | ORAL_TABLET | Freq: Every day | ORAL | 3 refills | Status: AC

## 2020-05-12 ENCOUNTER — Other Ambulatory Visit: Payer: Self-pay

## 2020-05-12 NOTE — Patient Instructions (Signed)
An unsuccessful telephone outreach was attempted today. The patient was referred to the case management team for assistance with care management and care coordination.   Follow Up Plan: Telephone follow up appointment with Managed Medicaid Care Management LCSW scheduled for: May 20, 2020 @ 2:00pm   [name], [place], MSW, [place]: [phone]

## 2020-05-12 NOTE — Patient Outreach (Signed)
Care Coordination  05/12/2020  Denise Moses 1980-10-07 [id number]  An unsuccessful telephone outreach was attempted today. The patient was referred to the case management team for assistance with care management and care coordination.   Follow Up Plan: Telephone follow up appointment with Managed Medicaid Care Management LCSW scheduled for: May 20, 2020 @ 2:00pm.   [name], BSW, MSW, [place]: [phone]

## 2020-05-13 ENCOUNTER — Ambulatory Visit (INDEPENDENT_AMBULATORY_CARE_PROVIDER_SITE_OTHER): Payer: Medicaid Other | Admitting: Obstetrics

## 2020-05-13 ENCOUNTER — Other Ambulatory Visit: Payer: Self-pay

## 2020-05-13 ENCOUNTER — Encounter: Payer: Self-pay | Admitting: Obstetrics

## 2020-05-13 DIAGNOSIS — E669 Obesity, unspecified: Secondary | ICD-10-CM

## 2020-05-13 DIAGNOSIS — O1093 Unspecified pre-existing hypertension complicating the puerperium: Secondary | ICD-10-CM

## 2020-05-13 DIAGNOSIS — O99214 Obesity complicating childbirth: Secondary | ICD-10-CM

## 2020-05-13 DIAGNOSIS — Z9079 Acquired absence of other genital organ(s): Secondary | ICD-10-CM

## 2020-05-13 DIAGNOSIS — I10 Essential (primary) hypertension: Secondary | ICD-10-CM

## 2020-05-13 NOTE — Progress Notes (Signed)
..   [place] Partum Visit Note  Denise Moses is a 39 y.o. [id number] female who presents for a postpartum visit. She is 7 weeks postpartum following a primary cesarean section.  I have fully reviewed the prenatal and intrapartum course. The delivery was at 37.5 gestational weeks.  Anesthesia: epidural. Postpartum course has been good. Baby is doing well. Baby is feeding by bottle - Marcos Eke. Bleeding no bleeding. Bowel function is normal. Bladder function is normal. Patient is not sexually active. Contraception method is tubal ligation. Postpartum depression screening: negative.   The pregnancy intention screening data noted above was reviewed. Potential methods of contraception were discussed. The patient elected to have a BTL with her C/S that was done without complications.   Edinburgh Postnatal Depression Scale - 05/13/20 1319      Edinburgh Postnatal Depression Scale:  In the Past 7 Days   I have been able to laugh and see the funny side of things. 0    I have looked forward with enjoyment to things. 0    I have blamed myself unnecessarily when things went wrong. 0    I have been anxious or worried for no good reason. 0    I have felt scared or panicky for no good reason. 0    Things have been getting on top of me. 2    I have been so unhappy that I have had difficulty sleeping. 0    I have felt sad or miserable. 0    I have been so unhappy that I have been crying. 0    The thought of harming myself has occurred to me. 0    Edinburgh Postnatal Depression Scale Total 2            The following portions of the patient's history were reviewed and updated as appropriate: allergies, current medications, past family history, past medical history, past social history, past surgical history and problem list.  Review of Systems A comprehensive review of systems was negative.    Objective:  Blood pressure 125/79, pulse 80, height 5\' 6"  (1.676 m), weight 235 lb 14.4 oz (107  kg), last menstrual period 05/04/2020, not currently breastfeeding.  General:  alert and no distress   Breasts:  inspection negative, no nipple discharge or bleeding, no masses or nodularity palpable  Lungs: clear to auscultation bilaterally  Heart:  regular rate and rhythm, S1, S2 normal, no murmur, click, rub or gallop  Abdomen: soft, non-tender; bowel sounds normal; no masses,  no organomegaly and there was a small area of granulation tissue in the center of the incision that was cauterized with silver nitrate   Vulva:  not evaluated  Vagina: not evaluated  Cervix:  not evaluated  Corpus: not examined  Adnexa:  not evaluated  Rectal Exam: Not performed.        Assessment:   1. Postpartum care following cesarean delivery - doing well  2. Status post bilateral salpingectomy - left salpingectomy in 2017 - right salpingectomy done with C/S in August 2021  3. HTN (hypertension), benign - managed by PCP  4. Obesity (BMI 35.0-39.9 without comorbidity) - program of caloric reduction, exercise and behavioral modification recommended   Plan:   Essential components of care per ACOG recommendations:  1.  Mood and well being: Patient with negative depression screening today.  - Patient does not use tobacco.  - hx of drug use? No    2. Infant care and feeding:  -Patient currently breastmilk feeding?  No  -Social determinants of health (SDOH) reviewed in EPIC. No concerns  3. Sexuality, contraception and birth spacing - Patient does not want a pregnancy in the next year.  Desired family size is 6 children.    4. Sleep and fatigue -Encouraged family/partner/community support of 4 hrs of uninterrupted sleep to help with mood and fatigue  5. Physical Recovery  - Discussed patients delivery.  No complications. - Patient has urinary incontinence? No - Patient is safe to resume physical and sexual activity  6.  Health Maintenance - Last pap smear done 09-17-2019 and was normal with  negative HPV. - first Mammogram is to be scheduled  7. Chronic Disease - HTN - PCP follow up  Baltazar Najjar, [place] for [facility] 05/13/20

## 2020-05-20 ENCOUNTER — Ambulatory Visit: Payer: Self-pay

## 2020-05-20 ENCOUNTER — Other Ambulatory Visit: Payer: Self-pay

## 2020-05-20 NOTE — Patient Instructions (Signed)
Visit Information  Ms. Amato was given information about Medicaid Managed Care team care coordination services as a part of their [employer] benefit. [name] verbally consented to engagement with the [facility] Managed Care team.   For questions related to your [facility], please call: [phone] or visit the homepage here: [email or web address]  If you would like to schedule transportation through your [facility], please call the following number at least 2 days in advance of your appointment: [phone]  Goals Addressed              This Visit's Progress   .  "I need some help with food." (pt-stated)        CARE PLAN ENTRY Medicaid Managed Care (see longitudinal plan of care for additional care plan information)  Current Barriers:  . Community resources access barrier: Patient Lacks knowledge of community resource: for Geneticist, molecular. . Limited access to food . Financial constraints related to being unemployed due to having MS. Patient states she receives SNAP benefits, but they ran out of food last month and she is not sure how things will go this month. She states she will return to work on June 02, 2020.  Clinical Social Work Clinical Goal(s):  [name] Over the next 30 days, patient will work with BSW to address concerns related to food insecurities.  Interventions: . Inter-disciplinary care team collaboration (see longitudinal plan of care) . Patient interviewed and appropriate assessments performed . Referred patient to community resources care guide team for assistance with food insecurities. . Discussed plans with patient for ongoing care management follow up and provided patient with direct contact information for care management team . Collaborated with Managed Medicaid BSW: food procurement  Patient Self Care  Activities:  . Patient will work with Managed Medicaid Care Team. . Licensed Clinical Social Worker will refer to [employer] for assisance. [name] LCSW will follow-up on June 20, 2020 @ 11:00am.  Initial goal documentation     .  "I want to attend therapy." (pt-stated)   Not on track     [place] (see longitudinal plan of care for additional care plan information)  Current Barriers:  [name] Mental Health Concerns  . Substance abuse issues -  patient tested positive for marijuana while hospitalized during childbirth. She stated the baby's cord also tested positive. She has history of smoking marijuana at least every other day prior to giving birth, but denies smoking since giving birth. [name] knowledge of community resource: for outpatient therapy.  Clinical Social Work Clinical Goal(s):  [name] Over the next 30 days, patient will work with [place] MM team member to address needs related to being scheduled with outpatient therapist. Patient stated she did not attend the scheduled therapy on April 23, 2020 due to having surgery on her recent C-section surgery site on April 22, 2020. She stated she would like to see the therapist she saw in the past, and she will schedule an appointment herself. At the time, she was unable to remember the name of the therapist but she has it written down. . Over the next 30 days, patient will work with therapist and also work with [place] CPS to address need for therapy and substance abuse treatment. Patient stated resources were given. [name] LCSW will attempt to collaborate with DSS regarding patient's needs. Patient stated case is closed and resources were given. [name] LCSW will collaborate with RN  CM regarding patient's recent Cesarean section and post-partum needs.  Interventions: . Inter-disciplinary care team collaboration (see longitudinal plan of care) . LCSW will make referral to [place] to schedule appointment at [place] for outpatient therapy at [phone].  BSW Update: Assisted patient with scheduling appointment at [place] on September 22 at 11 am. Provided education on how to contact office regarding any needed changes. [name] SDOH assessment performed. . Discussed importance of attending appointments with [place]. [name] LCSW provided education and support regarding substance abuse counseling services and encouraged patient to adhere to a counselor's plan of care  Patient Self Care Activities:  . Patient will attend appointments as scheduled. . Patient will work with the Managed Medicaid team as scheduled. [name] LCSW will work with patient over the next 30 days to assist with scheduling appointment for outpatient therapy. . Patient verbalizes understanding of plan to be scheduled for outpatient therapy.  . Attends church or other social activities . Patient provided verbal consent for team to work with [place] CPS . Patient unable to independently schedule self for outpatient therapy due to lack of knowledge of community resources.  Please see past updates related to this goal by clicking on the "Past Updates" button in the selected goal      .  "I want to live in a better place. It's overwhelming and it's bothering me." (pt-stated)   On track     [place] (see longitudinal plan of care for additional care plan information)  Current Barriers:  . Financial constraints related to unemployment due to having MS. Patient states she will start back to work on June 02, 2020. [name] Housing barriers . Lacks knowledge of community resource: doesn't know what else to do about mold and doesn't know where else she can move.   Clinical Social Work Clinical Goal(s):  [name] Over the next 30 days, patient will work with SW to address concerns related to housing concerns and mold in the current home. Patient states her apartment was inspected by the [facility] about 3 weeks  ago and it didn't pass. She stated the landlord is supposed to make the necessary repairs, but so far, have not done so. She states that she is worried that they may be asked to move since her apartment did not pass inspection, and she is unable to locate affordable housing. . Over the next 30-60 days, patient will work with [place] MM team member to address needs related to locating appropriate housing . Over the next 30 days, patient will attend all scheduled medical appointments: with PCP and OB/GYN.  Interventions: . Inter-disciplinary care team collaboration (see longitudinal plan of care) . Discussed ongoing collaboration and support from [facility] team, contact information provided. . Discussed patient's work with [employer] for assistance related to concerns with landlord and mold in rental home. Encouraged patient to collaborate closely as guided by the [facility] . LCSW will collaborate with [place] CPS to discuss housing options.  Patient Self Care Activities:  . Patient will continue taking her medication as prescribed. [name] LCSW will follow-up with patient in 30 days regarding housing. [name] LCSW will contact [place] worker in attempt to discuss patient's case and needs (Patient provided verbal consent). Patient reports case has been closed. Resources given. . Patient verbalizes understanding of plan to work with the [facility] Managed Care Team to assist her with housing needs . Self administers medications  as prescribed . Calls pharmacy for medication refills . Attends church or other social activities . Patient Unable to independently maneuver through community resources regarding how to handle the mold in her current home and to find appropriate housing.  Please see past updates related to this goal by clicking on the "Past Updates" button in the selected goal         Patient verbalizes understanding of instructions provided today.   The patient has  been provided with contact information for the Managed Medicaid care management team and has been advised to call with any health related questions or concerns.  Telephone follow up appointment with Managed Medicaid Care Management Team LCSW scheduled for: June 20, 2020 @ 11:00am.  [name], BSW, MSW, [place]: [phone]

## 2020-05-20 NOTE — Patient Outreach (Signed)
Care Coordination- Social Work  05/20/2020  Denise Moses 09/03/1980 [id number]  Subjective:    Denise Moses is an 39 y.o. year old female who is a primary patient of Denise Francois, NP.    Denise Moses was given information about Medicaid Managed Care team care coordination services today. Denise Moses agreed to services and verbal consent obtained  Review of patient status, laboratory and other test data was performed as part of evaluation for provision of services.  SDOH:   SDOH Screenings   Alcohol Screen: Low Risk   . Last Alcohol Screening Score (AUDIT): 0  Depression (PHQ2-9): Low Risk   . PHQ-2 Score: 1  Financial Resource Strain: Low Risk   . Difficulty of Paying Living Expenses: Not hard at all  Food Insecurity: Food Insecurity Present  . Worried About Charity fundraiser in the Last Year: Sometimes true  . Ran Out of Food in the Last Year: Sometimes true  Housing: Medium Risk  . Last Housing Risk Score: 1  Physical Activity: Insufficiently Active  . Days of Exercise per Week: 2 days  . Minutes of Exercise per Session: 30 min  Social Connections: Moderately Isolated  . Frequency of Communication with Friends and Family: More than three times a week  . Frequency of Social Gatherings with Friends and Family: More than three times a week  . Attends Religious Services: More than 4 times per year  . Active Member of Clubs or Organizations: No  . Attends Archivist Meetings: Never  . Marital Status: Never married  Stress: Stress Concern Present  . Feeling of Stress : To some extent  Tobacco Use: Medium Risk  . Smoking Tobacco Use: Former Smoker  . Smokeless Tobacco Use: Never Used  Transportation Needs: No Transportation Needs  . Lack of Transportation (Medical): No  . Lack of Transportation (Non-Medical): No   SDOH Interventions     Most Recent Value  SDOH Interventions  Food Insecurity Interventions Other (Comment)  [Referral  made to BSW for food]  Physical Activity Interventions Intervention Not Indicated  Alcohol Brief Interventions/Follow-up AUDIT Score <7 follow-up not indicated  Depression Interventions/Treatment  PHQ2-9 Score <4 Follow-up Not Indicated      Objective:    Medications:  Medications Reviewed Today    Reviewed by Denise Bombard, MD (Physician) on 05/13/20 at 1351  Med List Status: <None>  Medication Order Taking? Sig Documenting Provider Last Dose Status Informant  acetaminophen (TYLENOL) 325 MG tablet [id number] No Take 2 tablets (650 mg total) by mouth every 6 (six) hours as needed for mild pain.  Patient not taking: Reported on 05/07/2020   Denise Haymaker, MD Not Taking Active   coconut oil OIL [id number] No Apply 1 application topically as needed (nipple pain).  Patient not taking: Reported on 05/07/2020   Denise Settler, DO Not Taking Active   NIFEdipine (PROCARDIA XL/NIFEDICAL-XL) 90 MG 24 hr tablet [id number] Yes Take 1 tablet (90 mg total) by mouth daily. Denise Francois, NP Taking Active   Prenat-FeAsp-Meth-FA-DHA w/o A (PRENATE PIXIE) 10-0.6-0.4-200 MG CAPS [id number] No Take 1 tablet by mouth daily.  Patient not taking: Reported on 04/16/2020   Denise Bellman, MD Not Taking Active Self          Fall/Depression Screening:  Fall Risk  04/13/2016 03/21/2015 02/25/2014  Falls in the past year? No No No   PHQ 2/9 Scores 05/20/2020 04/08/2020 07/20/2017 06/30/2017 06/09/2016 04/13/2016 12/23/2015  PHQ - 2 Score 1 0 0  0 1 0 0  PHQ- 9 Score - - 3 5 3  - -    Assessment:  Goals Addressed              This Visit's Progress   .  "I need some help with food." (pt-stated)        CARE PLAN ENTRY Medicaid Managed Care (see longitudinal plan of care for additional care plan information)  Current Barriers:  . Community resources access barrier: Patient Lacks knowledge of community resource: for Geneticist, molecular. . Limited access to food . Financial constraints related to being  unemployed due to having MS. Patient states she receives SNAP benefits, but they ran out of food last month and she is not sure how things will go this month. She states she will return to work on June 02, 2020.  Clinical Social Work Clinical Goal(s):  [name] Over the next 30 days, patient will work with BSW to address concerns related to food insecurities.  Interventions: . Inter-disciplinary care team collaboration (see longitudinal plan of care) . Patient interviewed and appropriate assessments performed . Referred patient to community resources care guide team for assistance with food insecurities. . Discussed plans with patient for ongoing care management follow up and provided patient with direct contact information for care management team . Collaborated with Managed Medicaid BSW: food procurement  Patient Self Care Activities:  . Patient will work with Managed Medicaid Care Team. . Licensed Clinical Social Worker will refer to [employer] for assisance. [name] LCSW will follow-up on June 20, 2020 @ 11:00am.  Initial goal documentation     .  "I want to attend therapy." (pt-stated)   Not on track     [place] (see longitudinal plan of care for additional care plan information)  Current Barriers:  [name] Mental Health Concerns  . Substance abuse issues -  patient tested positive for marijuana while hospitalized during childbirth. She stated the baby's cord also tested positive. She has history of smoking marijuana at least every other day prior to giving birth, but denies smoking since giving birth. Denise Moses knowledge of community resource: for outpatient therapy.  Clinical Social Work Clinical Goal(s):  [name] Over the next 30 days, patient will work with [place] MM team member to address needs related to being scheduled with outpatient therapist. Patient stated she did not attend the scheduled therapy on April 23, 2020 due to having surgery on her recent C-section surgery  site on April 22, 2020. She stated she would like to see the therapist she saw in the past, and she will schedule an appointment herself. At the time, she was unable to remember the name of the therapist but she has it written down. . Over the next 30 days, patient will work with therapist and also work with [place] CPS to address need for therapy and substance abuse treatment. Patient stated resources were given. [name] LCSW will attempt to collaborate with DSS regarding patient's needs. Patient stated case is closed and resources were given. [name] LCSW will collaborate with RN CM regarding patient's recent Cesarean section and post-partum needs.  Interventions: . Inter-disciplinary care team collaboration (see longitudinal plan of care) . LCSW will make referral to [place] to schedule appointment at [place] for outpatient therapy at [phone].  BSW Update: Assisted patient with scheduling appointment at [place] on September 22 at 11 am. Provided education on how to contact office regarding any needed changes. [name] SDOH assessment  performed. . Discussed importance of attending appointments with [place]. [name] LCSW provided education and support regarding substance abuse counseling services and encouraged patient to adhere to a counselor's plan of care  Patient Self Care Activities:  . Patient will attend appointments as scheduled. . Patient will work with the Managed Medicaid team as scheduled. [name] LCSW will work with patient over the next 30 days to assist with scheduling appointment for outpatient therapy. . Patient verbalizes understanding of plan to be scheduled for outpatient therapy.  . Attends church or other social activities . Patient provided verbal consent for team to work with [place] CPS . Patient unable to independently schedule self for outpatient therapy due to lack of knowledge of community resources.  Please see past updates  related to this goal by clicking on the "Past Updates" button in the selected goal      .  "I want to live in a better place. It's overwhelming and it's bothering me." (pt-stated)   On track     [place] (see longitudinal plan of care for additional care plan information)  Current Barriers:  . Financial constraints related to unemployment due to having MS. Patient states she will start back to work on June 02, 2020. [name] Housing barriers . Lacks knowledge of community resource: doesn't know what else to do about mold and doesn't know where else she can move.   Clinical Social Work Clinical Goal(s):  [name] Over the next 30 days, patient will work with SW to address concerns related to housing concerns and mold in the current home. Patient states her apartment was inspected by the [facility] about 3 weeks ago and it didn't pass. She stated the landlord is supposed to make the necessary repairs, but so far, have not done so. She states that she is worried that they may be asked to move since her apartment did not pass inspection, and she is unable to locate affordable housing. . Over the next 30-60 days, patient will work with [place] MM team member to address needs related to locating appropriate housing . Over the next 30 days, patient will attend all scheduled medical appointments: with PCP and OB/GYN.  Interventions: . Inter-disciplinary care team collaboration (see longitudinal plan of care) . Discussed ongoing collaboration and support from [facility] team, contact information provided. . Discussed patient's work with [employer] for assistance related to concerns with landlord and mold in rental home. Encouraged patient to collaborate closely as guided by the [facility] . LCSW will collaborate with [place] CPS to discuss housing options.  Patient Self Care Activities:  . Patient will continue taking her medication as prescribed. [name] LCSW will follow-up  with patient in 30 days regarding housing. [name] LCSW will contact [place] worker in attempt to discuss patient's case and needs (Patient provided verbal consent). Patient reports case has been closed. Resources given. . Patient verbalizes understanding of plan to work with the [facility] Managed Care Team to assist her with housing needs . Self administers medications as prescribed . Calls pharmacy for medication refills . Attends church or other social activities . Patient Unable to independently maneuver through community resources regarding how to handle the mold in her current home and to find appropriate housing.  Please see past updates related to this goal by clicking on the "Past Updates" button in the selected goal         Plan: LCSW will refer to  Managed Medicaid BSW for assistance with housing and food.  LCSW will follow up with patient on June 20, 2020 @ 11:00am.  [name], BSW, MSW, [place]: [phone]

## 2020-05-23 ENCOUNTER — Other Ambulatory Visit: Payer: Self-pay

## 2020-05-23 NOTE — Patient Outreach (Signed)
Care Coordination  05/23/2020  Denise Moses 03/29/1981 [id number]  An unsuccessful telephone outreach was attempted today. The patient was referred to the case management team for assistance with care management and care coordination.   Follow Up Plan: Social Worker will follow up in 7 days.   Mickel Fuchs, BSW, [place]  High Risk Managed Medicaid Team

## 2020-05-23 NOTE — Patient Instructions (Signed)
Visit Information  Ms. [name]  - as a part of your Medicaid benefit, you are eligible for care management and care coordination services at no cost or copay. I was unable to reach you by phone today but would be happy to help you with your health related needs. Please feel free to call me @ [phone].   A member of the Managed Medicaid care management team will reach out to you again over the next 7 days.   Mickel Fuchs, BSW, [place]  High Risk Managed Medicaid Team

## 2020-05-25 ENCOUNTER — Other Ambulatory Visit: Payer: Self-pay

## 2020-05-25 ENCOUNTER — Emergency Department (HOSPITAL_COMMUNITY)
Admission: EM | Admit: 2020-05-25 | Discharge: 2020-05-25 | Disposition: A | Discharge status: Home / Self Care | Payer: Medicaid Other | Attending: Emergency Medicine | Admitting: Emergency Medicine

## 2020-05-25 DIAGNOSIS — L02412 Cutaneous abscess of left axilla: Secondary | ICD-10-CM | POA: Insufficient documentation

## 2020-05-25 DIAGNOSIS — Z87891 Personal history of nicotine dependence: Secondary | ICD-10-CM | POA: Insufficient documentation

## 2020-05-25 MED ORDER — ACETAMINOPHEN 500 MG PO TABS
1000.0000 mg | ORAL_TABLET | Freq: Once | ORAL | Status: AC
  Administered 2020-05-25: 1000 mg via ORAL
  Filled 2020-05-25: qty 2

## 2020-05-25 MED ORDER — SULFAMETHOXAZOLE-TRIMETHOPRIM 800-160 MG PO TABS
1.0000 | ORAL_TABLET | Freq: Once | ORAL | Status: AC
  Administered 2020-05-25: 1 via ORAL
  Filled 2020-05-25: qty 1

## 2020-05-25 MED ORDER — OXYCODONE-ACETAMINOPHEN 5-325 MG PO TABS
1.0000 | ORAL_TABLET | Freq: Once | ORAL | Status: AC
  Administered 2020-05-25: 1 via ORAL
  Filled 2020-05-25: qty 1

## 2020-05-25 MED ORDER — LIDOCAINE-EPINEPHRINE 1 %-1:100000 IJ SOLN
20.0000 mL | Freq: Once | INTRAMUSCULAR | Status: AC
  Administered 2020-05-25: 1 mL
  Filled 2020-05-25: qty 1

## 2020-05-25 MED ORDER — SULFAMETHOXAZOLE-TRIMETHOPRIM 800-160 MG PO TABS: 1.0000 | ORAL_TABLET | Freq: Two times a day (BID) | ORAL | 0 refills | Status: AC

## 2020-05-25 NOTE — Discharge Instructions (Addendum)
You have a small superficial abscess. This is a collection of pus.    Treatment includes antibiotics and moist heat therapy.   Take antibiotic as prescribed and until completed. Symptoms typically improve in 48-72 hours.   Apply moist heat (warm towel, heating pad) or massage under warm water at least twice a day to help drainage.   Any abscess can worsen, enlarge and spread infection into blood stream.  Return to the ER if you have fevers, chills, worsening swelling, redness, warmth.   Follow up with your general doctor in 48-72 hours if symptoms are not improving despite antibiotics and heat therapy

## 2020-05-25 NOTE — ED Triage Notes (Signed)
Pt. Stated, I have an abscess under my left arm for about a week.

## 2020-05-25 NOTE — ED Provider Notes (Signed)
[facility] EMERGENCY DEPARTMENT Provider Note   CSN: [id number] Arrival date & time: 05/25/20  1002     History Chief Complaint  Patient presents with  . Abscess    Denise Moses is a 39 y.o. female presents to ER for evaluation of boil in left axilla for the last week. Associated with pain redness warmth swelling odor. No fever, chills. Has been taking OTC anti inflammatories and placing hot rag. History of previous abscesses requiring incision and drainage.  Has a pending dermatology referral for recurrent abscess. Had a child 2 months ago, not breast feeding.   HPI     Past Medical History:  Diagnosis Date  . Abnormal MRI, cervical spine   . Anemia   . Elective abortion   . Heart murmur   . Herpes   . Infection    UTI  . Pregnancy induced hypertension    2001  . Vitamin D deficiency     Patient Active Problem List   Diagnosis Date Noted  . Cesarean delivery, delivered, current hospitalization 03/23/2020  . Alpha thalassemia silent carrier 12/18/2019  . AMA (advanced maternal age) multigravida 35+ 09/17/2019  . Maternal obesity affecting pregnancy, antepartum 09/17/2019  . Chronic hypertension affecting pregnancy 09/17/2019  . Supervision of other normal pregnancy, antepartum 09/10/2019  . GBS bacteriuria 08/19/2019  . Multiple sclerosis ([place]) 04/30/2019  . Cervical myelopathy ([place]) 04/12/2019  . Paresthesia 04/12/2019  . Vitamin D deficiency 07/05/2017  . Iron deficiency anemia 07/05/2017  . 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 Recurrent boils 04/13/2016  . Family history of breast cancer in first degree relative 04/13/2016  . Smoker 07/03/2014    Past Surgical History:  Procedure Laterality Date  . CESAREAN SECTION N/A 03/23/2020   Procedure: CESAREAN SECTION;  Surgeon: Jonnie Kind, MD;  Location: [facility];  Service: Obstetrics;  Laterality: N/A;  Arrest of Dilation  . CHOLECYSTECTOMY    . LAPAROSCOPY N/A  06/09/2016   Procedure: LAPAROSCOPY OPERATIVE;  Surgeon: Osborne Oman, MD;  Location: [facility];  Service: Gynecology;  Laterality: N/A;  ectopic   . THERAPEUTIC ABORTION     x 4  . UNILATERAL SALPINGECTOMY Left 06/09/2016   Procedure: UNILATERAL SALPINGECTOMY, REMOVAL OF IUD;  Surgeon: Osborne Oman, MD;  Location: [facility];  Service: Gynecology;  Laterality: Left;  . WISDOM TOOTH EXTRACTION       OB History    Gravida  12   Para  6   Term  5   Preterm  1   AB  6   Living  6     SAB  2   TAB  3   Ectopic  1   Multiple  0   Live Births  6           Family History  Problem Relation Age of Onset  . Breast cancer Mother 28       was a smoker, alcohol drinker in early life   . Breast cancer Maternal Aunt   . Cancer Maternal Grandfather        unsure of type  . Other Father        unsure of history  . Breast cancer Maternal Aunt     Social History   Tobacco Use  . Smoking status: Former Smoker    Packs/day: 0.25    Years: 10.00    Pack years: 2.50    Types: Cigarettes    Quit date: 03/2019  Years since quitting: 1.2  . Smokeless tobacco: Never Used  . Tobacco comment: 2-3 cigs/day  Vaping Use  . Vaping Use: Never used  Substance Use Topics  . Alcohol use: Not Currently    Comment: social drinker  . Drug use: Yes    Frequency: 5.0 times per week    Types: Marijuana    Comment: last used before delivery    Home Medications Prior to Admission medications   Medication Sig Start Date End Date Taking? Authorizing Provider  acetaminophen (TYLENOL) 325 MG tablet Take 2 tablets (650 mg total) by mouth every 6 (six) hours as needed for mild pain. Patient not taking: Reported on 05/07/2020 03/26/20 03/26/21  [name], MD  coconut oil OIL Apply 1 application topically as needed (nipple pain). Patient not taking: Reported on 05/07/2020 03/26/20   [name], DO  NIFEdipine (PROCARDIA XL/NIFEDICAL-XL) 90 MG 24 hr tablet Take 1 tablet (90 mg  total) by mouth daily. 05/08/20 05/08/21  Vevelyn Francois, NP  Prenat-FeAsp-Meth-FA-DHA w/o A (PRENATE PIXIE) 10-0.6-0.4-200 MG CAPS Take 1 tablet by mouth daily. Patient not taking: Reported on 04/16/2020 09/17/19   Constant, Peggy, MD  sulfamethoxazole-trimethoprim (BACTRIM DS) 800-160 MG tablet Take 1 tablet by mouth 2 (two) times daily for 7 days. 05/25/20 06/01/20  Kinnie Feil, PA-C    Allergies    Patient has no known allergies.  Review of Systems   Review of Systems  Skin:       Boil   All other systems reviewed and are negative.   Physical Exam Updated Vital Signs BP 130/73 (BP Location: Right Arm)   Pulse 70   Temp 98.6 F (37 C) (Oral)   Resp 16   Ht 5\' 6"  (1.676 m)   Wt 104.3 kg   LMP 05/04/2020   SpO2 100%   BMI 37.12 kg/m   Physical Exam Constitutional:      Appearance: She is well-developed.  HENT:     Head: Normocephalic.     Nose: Nose normal.  Eyes:     General: Lids are normal.  Cardiovascular:     Rate and Rhythm: Normal rate.  Pulmonary:     Effort: Pulmonary effort is normal. No respiratory distress.  Musculoskeletal:        General: Normal range of motion.     Cervical back: Normal range of motion.  Skin:    Comments: 5 x 2 cm area of fluctuance tenderness warmth in left axillary crease consistent with abscess. Minimal overlaying erythema. No extensive cellulitis outwardly.   Neurological:     Mental Status: She is alert.  Psychiatric:        Behavior: Behavior normal.     ED Results / Procedures / Treatments   Labs (all labs ordered are listed, but only abnormal results are displayed) Labs Reviewed  AEROBIC CULTURE (SUPERFICIAL SPECIMEN)    EKG None  Radiology No results found.  Procedures .Marland KitchenIncision and Drainage  Date/Time: 05/25/2020 1:34 PM Performed by: Kinnie Feil, PA-C Authorized by: Kinnie Feil, PA-C   Consent:    Consent obtained:  Verbal   Consent given by:  Patient   Risks discussed:   Bleeding, incomplete drainage, pain and damage to other organs   Alternatives discussed:  No treatment Universal protocol:    Procedure explained and questions answered to patient or proxy's satisfaction: yes     Required blood products, implants, devices, and special equipment available: yes     Site/side marked: yes  Immediately prior to procedure a time out was called: yes     Patient identity confirmed:  Verbally with patient Location:    Type:  Abscess   Size:  5 x 2 cm   Location: axillar L. Pre-procedure details:    Skin preparation:  Betadine Anesthesia (see MAR for exact dosages):    Anesthesia method:  Local infiltration   Local anesthetic:  Lidocaine 1% WITH epi Procedure type:    Complexity:  Complex Procedure details:    Incision types:  Single straight   Incision depth:  Subcutaneous   Scalpel blade:  11   Wound management:  Probed and deloculated, irrigated with saline and extensive cleaning   Drainage:  Purulent   Drainage amount:  Copious   Packing materials:  1/4 in gauze Post-procedure details:    Patient tolerance of procedure:  Tolerated well, no immediate complications   (including critical care time)  Medications Ordered in ED Medications  lidocaine-EPINEPHrine (XYLOCAINE W/EPI) 1 %-1:100000 (with pres) injection 20 mL (1 mL Infiltration Given by Other 05/25/20 1140)  oxyCODONE-acetaminophen (PERCOCET/ROXICET) 5-325 MG per tablet 1 tablet (1 tablet Oral Given 05/25/20 1140)  acetaminophen (TYLENOL) tablet 1,000 mg (1,000 mg Oral Given 05/25/20 1140)  sulfamethoxazole-trimethoprim (BACTRIM DS) 800-160 MG per tablet 1 tablet (1 tablet Oral Given 05/25/20 1140)    ED Course  I have reviewed the triage vital signs and the nursing notes.  Pertinent labs & imaging results that were available during my care of the patient were reviewed by me and considered in my medical decision making (see chart for details).    MDM Rules/Calculators/A&P                           No evidence of deep or severe abscess or cellulitis. No systemic symptoms. I&D here with significant purulent drainage and improvement in swelling.  Will dc with antibiotics, NSAIDs, warm compresses.  Patient instructed to pull out packing in 48 hours, closely monitor. Bactrim. She is familiar with this process and understands symptoms that warrant return to ER.  Final Clinical Impression(s) / ED Diagnoses Final diagnoses:  Abscess of axilla, left    Rx / DC Orders ED Discharge Orders         Ordered    sulfamethoxazole-trimethoprim (BACTRIM DS) 800-160 MG tablet  2 times daily        05/25/20 1247           Arlean Hopping 05/25/20 1336    Quintella Reichert, MD 05/25/20 1521

## 2020-05-26 ENCOUNTER — Telehealth: Payer: Self-pay

## 2020-05-26 DIAGNOSIS — G35 Multiple sclerosis: Secondary | ICD-10-CM

## 2020-05-26 NOTE — Telephone Encounter (Signed)
Pt currently has a [facility] referral outreach due to [place] Medicaid (Housing and Food Insecurities).   Pt mentioned that her transportation situation is now unreliable and she is not able to pick up her medication.   I am entering a referral for transportation with a comment about additional resources that may be available for patients that are not able to pick up there medications due to transportation concerns.

## 2020-05-26 NOTE — Telephone Encounter (Signed)
Transition Care Management Follow-up Telephone Call  Date of discharge and from where: 05/25/2020 from [facility]  How have you been since you were released from the hospital? Pt states that she is feeling well and did not have any questions at this time.   Any questions or concerns? No  Items Reviewed:  Did the pt receive and understand the discharge instructions provided? Yes   Medications obtained and verified? Yes   Other? No   Any new allergies since your discharge? No   Dietary orders reviewed? N/a  Do you have support at home? Yes    Functional Questionnaire: (I = Independent and D = Dependent) ADLs: I  Bathing/Dressing- I  Meal Prep- I  Eating- I  Maintaining continence- I  Transferring/Ambulation- I  Managing Meds- I   Follow up appointments reviewed:   [facility] f/u appt confirmed? Yes  [facility]uled to see [facility] on 05/28/2020 @ 03:50pm.  Are transportation arrangements needed? Yes   If their condition worsens, is the pt aware to call PCP or go to the Emergency Dept.? Yes  Was the patient provided with contact information for the PCP's office or ED? Yes  Was to pt encouraged to call back with questions or concerns? Yes

## 2020-05-27 LAB — AEROBIC CULTURE W GRAM STAIN (SUPERFICIAL SPECIMEN)

## 2020-05-28 ENCOUNTER — Ambulatory Visit: Payer: Medicaid Other

## 2020-05-28 ENCOUNTER — Telehealth: Payer: Self-pay

## 2020-05-28 NOTE — Telephone Encounter (Signed)
Post ED Visit - Positive Culture Follow-up  Culture report reviewed by antimicrobial stewardship pharmacist: [place] Team []  Nathan Batchelder, Pharm.D. []  [address], Pharm.D., BCPS AQ-ID []  Heide Guile, Pharm.D., BCPS []  Parks Neptune, Pharm.D., BCPS []  [place], Pharm.D., BCPS, AAHIVP []  [place], Pharm.D., BCPS, AAHIVP []  Legrand Como, PharmD, BCPS []  Salome Arnt, PharmD, BCPS []  Johnnette Gourd, PharmD, BCPS []  Hughes Better, PharmD []  Leeroy Cha, PharmD, BCPS []  Laqueta Linden, PharmD [place] Team []  [address], PharmD []  Leodis Sias, PharmD []  Lindell Spar, PharmD []  Royetta Asal, Rph []  Graylin Shiver) Rema Fendt, PharmD []  Glennon Mac, PharmD []  Arlyn Dunning, PharmD []  Netta Cedars, PharmD []  Dia Sitter, PharmD []  Leone Haven, PharmD []  Gretta Arab, PharmD []  Theodis Shove, PharmD []  Peggyann Juba, PharmD   Positive aerobic culture Treated with Bactrim DS, organism sensitive to the same and no further patient follow-up is required at this time.  Reuel Boom 05/28/2020, 9:57 AM

## 2020-06-02 ENCOUNTER — Ambulatory Visit: Payer: Medicaid Other | Admitting: Obstetrics

## 2020-06-03 ENCOUNTER — Other Ambulatory Visit: Payer: Self-pay

## 2020-06-03 NOTE — Patient Instructions (Signed)
Visit Information  Denise Moses was given information about Medicaid Managed Care team care coordination services as a part of their [facility] Medicaid benefit. Denise Moses verbally consented to engagement with the [facility] Managed Care team.   For questions related to your [facility], please call: [phone] or visit the homepage here: [email or web address]  If you would like to schedule transportation through your [facility], please call the following number at least 2 days in advance of your appointment: [phone]  Goals Addressed              This Visit's Progress     "I want to live in a better place. It's overwhelming and it's bothering me." (pt-stated)        CARE PLAN ENTRY Medicaid Managed Care (see longitudinal plan of care for additional care plan information)  Current Barriers:   Financial constraints related to unemployment due to having MS. Patient states she will start back to work on June 02, 2020.  Housing barriers  Lacks knowledge of community resource: doesn't know what else to do about mold and doesn't know where else she can move.   Clinical Social Work Clinical Goal(s):   Over the next 30 days, patient will work with SW to address concerns related to housing concerns and mold in the current home. Patient states her apartment was inspected by the [facility] about 3 weeks ago and it didn't pass. She stated the landlord is supposed to make the necessary repairs, but so far, have not done so. She states that she is worried that they may be asked to move since her apartment did not pass inspection, and she is unable to locate affordable housing.  Over the next 30-60 days, patient will work with [place] MM team member to address needs related to locating appropriate housing  Over the next 30 days, patient will attend all scheduled medical  appointments: with PCP and OB/GYN.  Interventions:  Inter-disciplinary care team collaboration (see longitudinal plan of care)  Discussed ongoing collaboration and support from [facility] team, contact information provided.  Discussed patient's work with [employer] for assistance related to concerns with landlord and mold in rental home. Encouraged patient to collaborate closely as guided by the [facility]. BSW spoke with patient, patient stated landlord painted over the mold.   LCSW will collaborate with [name] CPS to discuss housing options.  Patient Self Care Activities:   Patient will continue taking her medication as prescribed.  LCSW will follow-up with patient in 30 days regarding housing.  LCSW will contact [place] worker in attempt to discuss patient's case and needs (Patient provided verbal consent). Patient reports case has been closed. Resources given.  Patient verbalizes understanding of plan to work with the [facility] Managed Care Team to assist her with housing needs  Self administers medications as prescribed  Calls pharmacy for medication refills  Attends church or other social activities  Patient Unable to independently maneuver through community resources regarding how to handle the mold in her current home and to find appropriate housing.  Please see past updates related to this goal by clicking on the "Past Updates" button in the selected goal         Social Worker will follow up with patient in 30 days.Mickel Fuchs, BSW, [place]  High Risk Managed Medicaid Team

## 2020-06-03 NOTE — Patient Outreach (Signed)
Care Coordination- Social Work  06/03/2020  Denise Moses 11/22/1980 [id number]  Subjective:    Denise Moses is an 39 y.o. year old female who is a primary patient of Denise Francois, NP.    Denise Moses was given information about Medicaid Managed Care Moses care coordination services today. Denise Moses agreed to services and verbal consent obtained  Review of patient status, laboratory and other test data was performed as part of evaluation for provision of services.  SDOH:   SDOH Screenings   Alcohol Screen: Low Risk   . Last Alcohol Screening Score (AUDIT): 0  Depression (PHQ2-9): Low Risk   . PHQ-2 Score: 1  Financial Resource Strain: Low Risk   . Difficulty of Paying Living Expenses: Not hard at all  Food Insecurity: Food Insecurity Present  . Worried About Charity fundraiser in the Last Year: Sometimes true  . Ran Out of Food in the Last Year: Sometimes true  Housing: Medium Risk  . Last Housing Risk Score: 1  Physical Activity: Insufficiently Active  . Days of Exercise per Week: 2 days  . Minutes of Exercise per Session: 30 min  Social Connections: Moderately Isolated  . Frequency of Communication with Friends and Family: More than three times a week  . Frequency of Social Gatherings with Friends and Family: More than three times a week  . Attends Religious Services: More than 4 times per year  . Active Moses of Clubs or Organizations: No  . Attends Archivist Meetings: Never  . Marital Status: Never married  Stress: Stress Concern Present  . Feeling of Stress : To some extent  Tobacco Use: Medium Risk  . Smoking Tobacco Use: Former Smoker  . Smokeless Tobacco Use: Never Used  Transportation Needs: No Transportation Needs  . Lack of Transportation (Medical): No  . Lack of Transportation (Non-Medical): No     Objective:    Medications:  Medications Reviewed Today    Reviewed by Denise Bombard, MD (Physician) on  05/13/20 at 1351  Med List Status: <None>  Medication Order Taking? Sig Documenting Provider Last Dose Status Informant  acetaminophen (TYLENOL) 325 MG tablet [id number] No Take 2 tablets (650 mg total) by mouth every 6 (six) hours as needed for mild pain.  Patient not taking: Reported on 05/07/2020   Denise Haymaker, MD Not Taking Active   coconut oil OIL [id number] No Apply 1 application topically as needed (nipple pain).  Patient not taking: Reported on 05/07/2020   Denise Settler, DO Not Taking Active   NIFEdipine (PROCARDIA XL/NIFEDICAL-XL) 90 MG 24 hr tablet [id number] Yes Take 1 tablet (90 mg total) by mouth daily. Denise Francois, NP Taking Active   Prenat-FeAsp-Meth-FA-DHA w/o A (PRENATE PIXIE) 10-0.6-0.4-200 MG CAPS [id number] No Take 1 tablet by mouth daily.  Patient not taking: Reported on 04/16/2020   Denise Bellman, MD Not Taking Active Self          Fall/Depression Screening:  Fall Risk  04/13/2016 03/21/2015 02/25/2014  Falls in the past year? No No No   PHQ 2/9 Scores 05/20/2020 04/08/2020 07/20/2017 06/30/2017 06/09/2016 04/13/2016 12/23/2015  PHQ - 2 Score 1 0 0 0 1 0 0  PHQ- 9 Score - - 3 5 3  - -    Assessment:  Goals Addressed              This Visit's Progress   .  "I want to live in a better place. It's overwhelming and it's bothering  me." (pt-stated)        CARE PLAN ENTRY Medicaid Managed Care (see longitudinal plan of care for additional care plan information)  Current Barriers:  . Financial constraints related to unemployment due to having MS. Patient states she will start back to work on June 02, 2020. [name] Housing barriers . Lacks knowledge of community resource: doesn't know what else to do about mold and doesn't know where else she can move.   Clinical Social Work Clinical Goal(s):  [name] Over the next 30 days, patient will work with SW to address concerns related to housing concerns and mold in the current home. Patient states her apartment was inspected  by the Denise Moses about 3 weeks ago and it didn't pass. She stated the landlord is supposed to make the necessary repairs, but so far, have not done so. She states that she is worried that they may be asked to move since her apartment did not pass inspection, and she is unable to locate affordable housing. . Over the next 30-60 days, patient will work with Denise Moses to address needs related to locating appropriate housing . Over the next 30 days, patient will attend all scheduled medical appointments: with Denise Moses and Denise Moses.  Interventions: . Inter-disciplinary care Moses collaboration (see longitudinal plan of care) . Discussed ongoing collaboration and support from Denise Moses Moses, contact information provided. . Discussed patient's work with [employer] for assistance related to concerns with landlord and mold in rental home. Encouraged patient to collaborate closely as guided by the Denise Moses. Denise Moses spoke with patient, patient stated landlord painted over the mold.  [name] Denise Moses will collaborate with Denise Moses to discuss housing options.  Patient Self Care Activities:  . Patient will continue taking her medication as prescribed. [name] Denise Moses will follow-up with patient in 30 days regarding housing. [name] Denise Moses will contact Denise Moses in attempt to discuss patient's case and needs (Patient provided verbal consent). Patient reports case has been closed. Resources given. . Patient verbalizes understanding of plan to work with the Denise Moses to assist her with housing needs . Self administers medications as prescribed . Calls pharmacy for medication refills . Attends church or other social activities . Patient Unable to independently maneuver through community resources regarding how to handle the mold in her current home and to find appropriate housing.  Please see past updates related to this goal by clicking on the "Past Updates" button in the selected goal           Plan:  Patient will send someone to get her medications or go get them on her own. Patient states her truck will be out of the shop by the end of the week.  Denise Moses will follow up with patient in 30 days.

## 2020-06-20 ENCOUNTER — Other Ambulatory Visit: Payer: Self-pay

## 2020-06-20 NOTE — Patient Outreach (Addendum)
Care Coordination  06/20/2020  Hollan Philipp July 15, 1981 [id number]  LCSW received call from patient explaining that she went back to work as planned. Patient requested for appointment to be rescheduled due to being at work. Licensed Clinical Social Worker will follow up with patient on Monday, 06/23/2020 @ 2:00pm. Patient verbalized understanding and was agreeable.    [name], BSW, MSW, LCSW Social Work Case Freight forwarder - [place]  Direct [place]: [phone]

## 2020-06-20 NOTE — Patient Instructions (Signed)
Visit Information  Ms. [name]  - as a part of your Medicaid benefit, you are eligible for care management and care coordination services at no cost or copay. I was unable to reach you by phone today but would be happy to help you with your health related needs. Please feel free to call me @ Herbie Drape number).   A member of the Managed Medicaid care management team will reach out to you again over the next 14 days.   LCSW will follow up on July 10, 2020 @ 11:00am.   [name], BSW, MSW, [name]: [phone]

## 2020-06-20 NOTE — Patient Instructions (Signed)
Visit Information  Denise Moses was given information about Medicaid Managed Care team care coordination services as a part of their [employer] Medicaid benefit. [name] verbally consented to engagement with the [facility] Managed Care team.   For questions related to your [facility], please call: [phone] or visit the homepage here: [email or web address]  If you would like to schedule transportation through your [facility], please call the following number at least 2 days in advance of your appointment: [phone]  Goals Addressed   None    Patient requested for appointment to be rescheduled due to patient being at work. Licensed Clinical Social Worker will follow up with patient on Monday, 06/23/2020 @ 2:00pm. Patient verbalized understanding and was agreeable.   [name], BSW, MSW, LCSW Social Work Case Freight forwarder - [place]  Direct [place]: [phone]

## 2020-06-20 NOTE — Patient Outreach (Signed)
Care Coordination  06/20/2020  Denise Moses 22-Mar-1981 [id number]  An unsuccessful telephone outreach was attempted today. The patient was referred to the case management team for assistance with care management and care coordination.   Follow Up Plan: The Managed Medicaid care management team will reach out to the patient again over the next 14 days.   [name], BSW, MSW, LCSW Social Work Case Freight forwarder - [place]  Direct [place]: [phone]

## 2020-06-23 ENCOUNTER — Other Ambulatory Visit: Payer: Self-pay

## 2020-06-23 NOTE — Patient Instructions (Addendum)
Visit Information  Ms. [name]  - as a part of your Medicaid benefit, you are eligible for care management and care coordination services at no cost or copay. I was unable to reach you by phone today but would be happy to help you with your health related needs. Please feel free to call me @ [phone].  A member of the Managed Medicaid care management team will reach out to you again over the next 30 days.  LCSW will follow up on July 23, 2020 @ 3:00pm.  [name], BSW, MSW, [place]: [phone]

## 2020-06-23 NOTE — Patient Outreach (Signed)
Care Coordination  06/23/2020  Emary Zalar [birth year] [id number]  An unsuccessful telephone outreach was attempted today. The patient was referred to the case management team for assistance with care management and care coordination.   Follow Up Plan: The Managed Medicaid care management team will reach out to the patient again over the next 30 days.   [name], BSW, MSW, LCSW Social Work Case Freight forwarder - [place]  Direct [place]: [phone]

## 2020-07-03 ENCOUNTER — Other Ambulatory Visit: Payer: Self-pay

## 2020-07-03 ENCOUNTER — Ambulatory Visit: Payer: Medicaid Other

## 2020-07-03 NOTE — Patient Instructions (Signed)
Visit Information  Ms. Liss was given information about Medicaid Managed Care team care coordination services as a part of their [place] Medicaid benefit. [name] verbally consented to engagement with the [facility] Managed Care team.    Thank you for speaking with me today. Per our conversation for denture services please  contact [facility] located at [address], [place], [place] [address]. They can be  reached at [phone].   For questions related to your [facility], please call: [phone] or visit the homepage here: [email or web address]  If you would like to schedule transportation through your [facility], please call the following number at least 2 days in advance of your appointment: [phone]  Goals Addressed   None     Social Worker will follow up in 30 days.   Mickel Fuchs, BSW, [place]  High Risk Managed Medicaid Team

## 2020-07-03 NOTE — Patient Outreach (Addendum)
Care Coordination- Social Work  07/03/2020  Denise Moses 1981/01/13 [id number]  Subjective:    Denise Moses is an 39 y.o. year old female who is a primary patient of Vevelyn Francois, NP.    Denise Moses was given information about Medicaid Managed Care team care coordination services today. Denise Moses agreed to services and verbal consent obtained  Review of patient status, laboratory and other test data was performed as part of evaluation for provision of services.  SDOH:   SDOH Screenings   Alcohol Screen: Low Risk   . Last Alcohol Screening Score (AUDIT): 0  Depression (PHQ2-9): Low Risk   . PHQ-2 Score: 1  Financial Resource Strain: Low Risk   . Difficulty of Paying Living Expenses: Not hard at all  Food Insecurity: Food Insecurity Present  . Worried About Charity fundraiser in the Last Year: Sometimes true  . Ran Out of Food in the Last Year: Sometimes true  Housing: Medium Risk  . Last Housing Risk Score: 1  Physical Activity: Insufficiently Active  . Days of Exercise per Week: 2 days  . Minutes of Exercise per Session: 30 min  Social Connections: Moderately Isolated  . Frequency of Communication with Friends and Family: More than three times a week  . Frequency of Social Gatherings with Friends and Family: More than three times a week  . Attends Religious Services: More than 4 times per year  . Active Member of Clubs or Organizations: No  . Attends Archivist Meetings: Never  . Marital Status: Never married  Stress: Stress Concern Present  . Feeling of Stress : To some extent  Tobacco Use: Medium Risk  . Smoking Tobacco Use: Former Smoker  . Smokeless Tobacco Use: Never Used  Transportation Needs: No Transportation Needs  . Lack of Transportation (Medical): No  . Lack of Transportation (Non-Medical): No     Objective:    Medications:  Medications Reviewed Today    Reviewed by Shelly Bombard, MD (Physician) on  05/13/20 at 1351  Med List Status: <None>  Medication Order Taking? Sig Documenting Provider Last Dose Status Informant  acetaminophen (TYLENOL) 325 MG tablet [id number] No Take 2 tablets (650 mg total) by mouth every 6 (six) hours as needed for mild pain.  Patient not taking: Reported on 05/07/2020   [name], MD Not Taking Active   coconut oil OIL [id number] No Apply 1 application topically as needed (nipple pain).  Patient not taking: Reported on 05/07/2020   [name], DO Not Taking Active   NIFEdipine (PROCARDIA XL/NIFEDICAL-XL) 90 MG 24 hr tablet [id number] Yes Take 1 tablet (90 mg total) by mouth daily. Vevelyn Francois, NP Taking Active   Prenat-FeAsp-Meth-FA-DHA w/o A (PRENATE PIXIE) 10-0.6-0.4-200 MG CAPS [id number] No Take 1 tablet by mouth daily.  Patient not taking: Reported on 04/16/2020   [name], MD Not Taking Active Self          Fall/Depression Screening:  Fall Risk  04/13/2016 03/21/2015 02/25/2014  Falls in the past year? No No No   PHQ 2/9 Scores 05/20/2020 04/08/2020 07/20/2017 06/30/2017 06/09/2016 04/13/2016 12/23/2015  PHQ - 2 Score 1 0 0 0 1 0 0  PHQ- 9 Score - - 3 5 3  - -    Assessment: Patient stated she was able to get her truck out of the shop and has been able to get her medications.  Patient asked about getting dentures. She stated she would like front top dentures and  the place she went to before closed. BSW provided patient with the information for [facility] [phone].   Plan: BSW will follow up with patient in 30 days

## 2020-07-10 ENCOUNTER — Ambulatory Visit: Payer: Medicaid Other

## 2020-07-14 ENCOUNTER — Encounter: Payer: Self-pay | Admitting: General Practice

## 2020-07-23 ENCOUNTER — Other Ambulatory Visit: Payer: Self-pay

## 2020-07-23 NOTE — Patient Outreach (Signed)
Care Coordination  07/23/2020  Nakoma Gotwalt 05-01-1981 [id number]   Medicaid Managed Care   Unsuccessful Outreach Note  07/23/2020 Name: Denise Moses MRN: [id number] DOB: 12-25-1980  Referred by: Vevelyn Francois, NP Reason for referral : High Risk Managed Medicaid (Therapy follow up)   A successful telephone outreach was attempted today. Patient picked up the phone and informed LCSW that she was unable to hear LCSW well. LCSW offered to call back and patient agreed. Unfortunately, when LCSW called back, patient did not answer. The patient was referred to the case management team for assistance with care management and care coordination.   Follow Up Plan: A member of the Managed Medicaid Care Team will follow up in 14 days.  [name], BSW, MSW, LCSW Social Work Case Freight forwarder - [place]  Direct [place]: [phone]

## 2020-07-23 NOTE — Patient Instructions (Signed)
Visit Information  Ms. Denise Moses  - as a part of your Medicaid benefit, you are eligible for care management and care coordination services at no cost or copay. I was unable to reach you by phone today but would be happy to help you with your health related needs. Please feel free to call me @ [phone].   A member of the Managed Medicaid care management team will reach out to you again over the next 14 days.   [name], BSW, MSW, LCSW Social Work Case Freight forwarder - [place]  Direct [place]: [phone]

## 2020-08-05 ENCOUNTER — Other Ambulatory Visit: Payer: Self-pay

## 2020-08-05 NOTE — Patient Outreach (Signed)
  Medicaid Managed Care Social Work Note  08/05/2020 Name:  Denise Moses MRN:  [id number] DOB:  July 10, 1981  [name] is an 40 y.o. year old female who is a primary patient of Denise Merino, NP.  The [facility] Managed Care Coordination team was consulted for assistance with:  Dental needs  Ms. Barner was given information about Medicaid Managed CareCoordination services today. Denise Moses agreed to services and verbal consent obtained.  Engaged with patient  for by telephone forfollow up visit in response to referral for case management and/or care coordination services.   Assessments/Interventions:  Review of past medical history, allergies, medications, health status, including review of consultants reports, laboratory and other test data, was performed as part of comprehensive evaluation and provision of chronic care management services.  SDOH: (Social Determinant of Health) assessments and interventions performed:   Advanced Directives Status:  See Care Plan for related entries  Care Plan                 No Known Allergies  Medications Reviewed Today    Reviewed by Brock Bad, MD (Physician) on 05/13/20 at 1351  Med List Status: <None>  Medication Order Taking? Sig Documenting Provider Last Dose Status Informant  acetaminophen (TYLENOL) 325 MG tablet [id number] No Take 2 tablets (650 mg total) by mouth every 6 (six) hours as needed for mild pain.  Patient not taking: Reported on 05/07/2020   Mirian Mo, MD Not Taking Active   coconut oil OIL [id number] No Apply 1 application topically as needed (nipple pain).  Patient not taking: Reported on 05/07/2020   Sabino Dick, DO Not Taking Active   NIFEdipine (PROCARDIA XL/NIFEDICAL-XL) 90 MG 24 hr tablet [id number] Yes Take 1 tablet (90 mg total) by mouth daily. Denise Merino, NP Taking Active   Prenat-FeAsp-Meth-FA-DHA w/o A (PRENATE PIXIE) 10-0.6-0.4-200 MG CAPS [id number] No Take 1 tablet by  mouth daily.  Patient not taking: Reported on 04/16/2020   Constant, Denise Gin, MD Not Taking Active Self          Patient Active Problem List   Diagnosis Date Noted  . Cesarean delivery, delivered, current hospitalization 03/23/2020  . Alpha thalassemia silent carrier 12/18/2019  . AMA (advanced maternal age) multigravida 35+ 09/17/2019  . Maternal obesity affecting pregnancy, antepartum 09/17/2019  . Chronic hypertension affecting pregnancy 09/17/2019  . Supervision of other normal pregnancy, antepartum 09/10/2019  . GBS bacteriuria 08/19/2019  . Multiple sclerosis (HCC) 04/30/2019  . Cervical myelopathy (HCC) 04/12/2019  . Paresthesia 04/12/2019  . Vitamin D deficiency 07/05/2017  . Iron deficiency anemia 07/05/2017  . 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 Recurrent boils 04/13/2016  . Family history of breast cancer in first degree relative 04/13/2016  . Smoker 07/03/2014    Conditions to be addressed/monitored per PCP order:  Dental needs  There are no care plans to display for this patient.   Follow up:  Patient agrees to Care Plan and Follow-up.  Plan: The Managed Medicaid care management team will reach out to the patient again over the next 14 days.  Date/time of next scheduled Social Work care management/care coordination outreach:  08/25/2020 10:15.  Patient stated she has not be able to contact [facility] yet. Patient states she will contact them soon to get an appointment

## 2020-08-05 NOTE — Patient Instructions (Signed)
Visit Information  Denise Moses was given information about Medicaid Managed Care team care coordination services as a part of their [facility] Medicaid benefit. Jonni Sanger verbally consented to engagement with the [facility] Managed Care team.   For questions related to your [facility], please call: [phone] or visit the homepage here: [email or web address]  If you would like to schedule transportation through your [facility], please call the following number at least 2 days in advance of your appointment: [phone]  Goals Addressed   None    There are no care plans to display for this patient.   Social Worker will follow up with patient in 14 days.Shaune Leeks

## 2020-08-07 ENCOUNTER — Ambulatory Visit: Payer: Self-pay | Admitting: Nurse Practitioner

## 2020-08-25 ENCOUNTER — Other Ambulatory Visit: Payer: Self-pay

## 2020-08-25 NOTE — Patient Outreach (Signed)
Care Coordination  08/25/2020  Bettie Capistran Aug 20, 1980 [id number]   Medicaid Managed Care   Unsuccessful Outreach Note  08/25/2020 Name: Arayah Krouse MRN: [id number] DOB: 1981-02-28  Referred by: Vevelyn Francois, NP Reason for referral : High Risk Managed Medicaid (HR MM Unsuccessful Telephone Outreach)   An unsuccessful telephone outreach was attempted today. The patient was referred to the case management team for assistance with care management and care coordination.   Follow Up Plan: The care management team will reach out to the patient again over the next 30 days.   Mickel Fuchs, BSW, [place]  High Risk Managed Medicaid Team

## 2020-08-25 NOTE — Patient Instructions (Signed)
Visit Information  Ms. [name]  - as a part of your Medicaid benefit, you are eligible for care management and care coordination services at no cost or copay. I was unable to reach you by phone today but would be happy to help you with your health related needs. Please feel free to call me @ [phone].   A member of the Managed Medicaid care management team will reach out to you again over the next 30 days.   Mickel Fuchs, BSW, [place]  High Risk Managed Medicaid Team

## 2020-09-11 ENCOUNTER — Telehealth: Payer: Self-pay | Admitting: *Deleted

## 2020-09-11 ENCOUNTER — Ambulatory Visit: Payer: Medicaid Other | Admitting: Neurology

## 2020-09-11 ENCOUNTER — Encounter: Payer: Self-pay | Admitting: Neurology

## 2020-09-11 ENCOUNTER — Encounter: Payer: Self-pay | Admitting: *Deleted

## 2020-09-11 VITALS — BP 131/80 | HR 80 | Ht 66.0 in | Wt 236.5 lb

## 2020-09-11 DIAGNOSIS — E559 Vitamin D deficiency, unspecified: Secondary | ICD-10-CM | POA: Diagnosis not present

## 2020-09-11 DIAGNOSIS — R7989 Other specified abnormal findings of blood chemistry: Secondary | ICD-10-CM | POA: Diagnosis not present

## 2020-09-11 DIAGNOSIS — M792 Neuralgia and neuritis, unspecified: Secondary | ICD-10-CM

## 2020-09-11 DIAGNOSIS — G35 Multiple sclerosis: Secondary | ICD-10-CM | POA: Diagnosis not present

## 2020-09-11 DIAGNOSIS — B079 Viral wart, unspecified: Secondary | ICD-10-CM | POA: Diagnosis not present

## 2020-09-11 DIAGNOSIS — R799 Abnormal finding of blood chemistry, unspecified: Secondary | ICD-10-CM | POA: Diagnosis not present

## 2020-09-11 DIAGNOSIS — E079 Disorder of thyroid, unspecified: Secondary | ICD-10-CM | POA: Diagnosis not present

## 2020-09-11 MED ORDER — SHINGRIX 50 MCG/0.5ML IM SUSR: INTRAMUSCULAR | 0 refills | Status: DC

## 2020-09-11 MED ORDER — GABAPENTIN 300 MG PO CAPS: 300.0000 mg | ORAL_CAPSULE | Freq: Three times a day (TID) | ORAL | 11 refills | Status: DC

## 2020-09-11 NOTE — Telephone Encounter (Signed)
09/11/20 - Labs collected for JCV ab. Sample placed in [facility] box for pick up.

## 2020-09-11 NOTE — Telephone Encounter (Signed)
Expedited PA for Ocrevus started on covermymeds (key [id number]). Decision pending.  Pt has coverage through [facility]. [employer] reviews prescriptions (TK#[id number]).  Pharmacy Claims# [id number].   She has not tried any other medications. JCV ab high at 2.99. Requires Ocrevus for aggressive MS.

## 2020-09-11 NOTE — Telephone Encounter (Addendum)
Ocrevus has been denied. Her plan states she must try two preferred drugs (Avonex, Betaseron, Copaxone, Rebif). None of these medication are strong enough for her aggressive MS. An appeal case has been started.   Appeal letter faxed and confirmed to [phone].  The patient has been provided with this update.  She is in the process of getting her Shingrix vaccinations.  If Ocrevus is approved, she will have to be set up at [facility] plan). This information was provided on the start form.

## 2020-09-11 NOTE — Progress Notes (Signed)
PATIENT: Denise Moses DOB: 01/14/1981  Chief Complaint  Patient presents with  . Follow-up    MS. She is still not on any medication. Medicaid would not cover Ocrevus as a first therapy. She was going to participate in our research study but did not qualify due to becoming pregnant. She had a tubal ligation on 03/23/20 (same day she delivered her sixth child).     HISTORICAL  Denise Moses is 40 year old female, seen in request by orthopedic surgeon Dr. Edmonia Lynch for evaluation of possible multiple sclerosis, her primary care physician is PA Fredia Beets, she is accompanied by her significant others at today's clinic visit on Sept 20, 2020  I have reviewed and summarized the referring note from the referring physician.  She is currently [redacted] weeks pregnant, per patient, she is going to have a elective abortion,  In July 2020, she had a sudden onset neck pain, radiating pain to bilateral shoulder, bilateral fingertips may 2, 3, fourth fingertips, also developed mild weak grip, at the peak of her difficulty, she also described difficulty walking, urinary urgency, incontinence, she was given a prescription of steroid, and gabapentin, which has helped her symptoms some, but still not back to her baseline, she still has bilateral upper extremity paresthesia, no longer have significant gait abnormality, lower extremity paresthesia, she denies visual loss.  I personally reviewed MRI of cervical spine on February 27, 2019, signal abnormality within the cord at C2,  UPDATE Sept 30 2020: She is scheduled for abortion on May 04, 2019, there is no new neurological symptoms,  We personally reviewed MRI of the brain without contrast April 29, 2019, multiple T2/flair hyperintensity in the pons, hemisphere, consistent with relapsing remitting multiple sclerosis,  Laboratory evaluation on April 12, 2019 showed decreased vitamin D 14.4, normal or negative  angiotensin-converting enzyme level, ANA, 10, iron level, copper level was mildly elevated 225, hepatitis panel, A1c was 5.5, normal electrophoresis CPK was 24, ESR was 112, normal TSH, C-reactive protein was mildly elevated 11, normal folic acid, HIV, RPR, H47,  UPDATE Jun 12 2019: She had a elective abortion C-section on May 04, 2019, we also called her primary care physician, she has lost the follow-up since 2018, missed multiple scheduled visit, was actually discharged by her primary care physician  She complains of worsening bilateral lower extremity swelling, burning pain, denies significant gait abnormality  We had extensive discussion with patient, and her significant others, she preferred to started ocrelizumab, emphasized with patient the importance to stay on contraceptives, will refer her to OB/GYN for options.  UPDATE Sep 11 2020: She had a C-section with healthy baby on March 23 2021, she is no longer breast-feeding, she , does complains of intermittent blurry vision, seems to noticed more difficulty walking post pattern, she denies falling bladder incontinence, she is now ready to receive treatment, had tubal ligation during C-section  We again personally reviewed MRI of the brain without contrast September 2020, multiple T2/flair hyperintensity foci in the pons, hemisphere, also with C2 involvement   REVIEW OF SYSTEMS: Full 14 system review of systems performed and notable only for as above All other review of systems were negative.  ALLERGIES: No Known Allergies  HOME MEDICATIONS: Current Outpatient Medications  Medication Sig Dispense Refill  . acetaminophen (TYLENOL) 325 MG tablet Take 2 tablets (650 mg total) by mouth every 6 (six) hours as needed for mild pain. 100 tablet 2  . NIFEdipine (PROCARDIA XL/NIFEDICAL-XL) 90 MG 24 hr tablet Take  1 tablet (90 mg total) by mouth daily. 90 tablet 3   No current facility-administered medications for this visit.    PAST  MEDICAL HISTORY: Past Medical History:  Diagnosis Date  . Abnormal MRI, cervical spine   . Anemia   . Elective abortion   . Heart murmur   . Herpes   . Infection    UTI  . Pregnancy induced hypertension    2001  . Vitamin D deficiency     PAST SURGICAL HISTORY: Past Surgical History:  Procedure Laterality Date  . CESAREAN SECTION N/A 03/23/2020   Procedure: CESAREAN SECTION;  Surgeon: Jonnie Kind, MD;  Location: [facility];  Service: Obstetrics;  Laterality: N/A;  Arrest of Dilation  . CHOLECYSTECTOMY    . LAPAROSCOPY N/A 06/09/2016   Procedure: LAPAROSCOPY OPERATIVE;  Surgeon: Osborne Oman, MD;  Location: [facility];  Service: Gynecology;  Laterality: N/A;  ectopic   . THERAPEUTIC ABORTION     x 4  . TUBAL LIGATION    . UNILATERAL SALPINGECTOMY Left 06/09/2016   Procedure: UNILATERAL SALPINGECTOMY, REMOVAL OF IUD;  Surgeon: Osborne Oman, MD;  Location: [facility];  Service: Gynecology;  Laterality: Left;  . WISDOM TOOTH EXTRACTION      FAMILY HISTORY: Family History  Problem Relation Age of Onset  . Breast cancer Mother 66       was a smoker, alcohol drinker in early life   . Breast cancer Maternal Aunt   . Cancer Maternal Grandfather        unsure of type  . Other Father        unsure of history  . Breast cancer Maternal Aunt     SOCIAL HISTORY: Social History   Socioeconomic History  . Marital status: Single    Spouse name: Not on file  . Number of children: 6  . Years of education: some college  . Highest education level: Not on file  Occupational History  . Occupation: Unemployed  Tobacco Use  . Smoking status: Former Smoker    Packs/day: 0.25    Years: 10.00    Pack years: 2.50    Types: Cigarettes    Quit date: 03/2019    Years since quitting: 1.5  . Smokeless tobacco: Never Used  . Tobacco comment: 2-3 cigs/day  Vaping Use  . Vaping Use: Never used  Substance and Sexual Activity  . Alcohol use: Not Currently    Comment: social drinker  .  Drug use: Yes    Frequency: 5.0 times per week    Types: Marijuana    Comment: last used before delivery  . Sexual activity: Not Currently    Partners: Male    Birth control/protection: Surgical  Other Topics Concern  . Not on file  Social History Narrative   Lives at home with children.   Right-handed.   No daily caffeine use.   Social Determinants of Health   Financial Resource Strain: Low Risk   . Difficulty of Paying Living Expenses: Not hard at all  Food Insecurity: Food Insecurity Present  . Worried About Charity fundraiser in the Last Year: Sometimes true  . Ran Out of Food in the Last Year: Sometimes true  Transportation Needs: No Transportation Needs  . Lack of Transportation (Medical): No  . Lack of Transportation (Non-Medical): No  Physical Activity: Insufficiently Active  . Days of Exercise per Week: 2 days  . Minutes of Exercise per Session: 30 min  Stress: Stress Concern Present  .  Feeling of Stress : To some extent  Social Connections: Moderately Isolated  . Frequency of Communication with Friends and Family: More than three times a week  . Frequency of Social Gatherings with Friends and Family: More than three times a week  . Attends Religious Services: More than 4 times per year  . Active Member of Clubs or Organizations: No  . Attends Archivist Meetings: Never  . Marital Status: Never married  Intimate Partner Violence: Not At Risk  . Fear of Current or Ex-Partner: No  . Emotionally Abused: No  . Physically Abused: No  . Sexually Abused: No     PHYSICAL EXAM   Vitals:   09/11/20 0733  BP: 131/80  Pulse: 80  Weight: 236 lb 8 oz (107.3 kg)  Height: $Remove'5\' 6"'BYehMME$  (1.676 m)   Not recorded     Body mass index is 38.17 kg/m.  PHYSICAL EXAMNIATION:  Gen: NAD, conversant, well nourised, well groomed                     Cardiovascular: Regular rate rhythm, no peripheral edema, warm, nontender. Eyes: Conjunctivae clear without exudates or  hemorrhage Neck: Supple, no carotid bruits. Pulmonary: Clear to auscultation bilaterally   NEUROLOGICAL EXAM:  MENTAL STATUS: Speech:    Speech is normal; fluent and spontaneous with normal comprehension.  Cognition:     Orientation to time, place and person     Normal recent and remote memory     Normal Attention span and concentration     Normal Language, naming, repeating,spontaneous speech     Fund of knowledge   CRANIAL NERVES: CN II: Visual fields are full to confrontation. Pupils are round equal and briskly reactive to light. CN III, IV, VI: extraocular movement are normal. No ptosis. CN V: Facial sensation is intact to pinprick in all 3 divisions bilaterally. Corneal responses are intact.  CN VII: Face is symmetric with normal eye closure and smile. CN VIII: Hearing is normal to causal conversation. CN IX, X: Palate elevates symmetrically. Phonation is normal. CN XI: Head turning and shoulder shrug are intact CN XII: Tongue is midline with normal movements and no atrophy.  MOTOR: There is no pronator drift of out-stretched arms. Muscle bulk and tone are normal. Muscle strength is normal.  REFLEXES: Reflexes are 3 and symmetric at the biceps, triceps, knees, and ankles. Plantar responses are flexor.  SENSORY: Intact to light touch, pinprick, positional sensation and vibratory sensation are intact in fingers and toes.  COORDINATION: Rapid alternating movements and fine finger movements are intact. There is no dysmetria on finger-to-nose and heel-knee-shin.    GAIT/STANCE: Mild antalgic    DIAGNOSTIC DATA (LABS, IMAGING, TESTING) - I reviewed patient records, labs, notes, testing and imaging myself where available.   ASSESSMENT AND PLAN  Kalyssa Anker is a 40 y.o. female   Relapsing remitting multiple sclerosis  She had a elective abortion on May 04, 2019, shortly afterwards, she was pregnant again, had C-section on March 23, 2020, was tubal  ligation  Diagnosis of relapsing remitting multiple sclerosis is confirmed by abnormal MRI of the brain, cervical spine, spinal fluid testing November 2020 showed more than 5 oligoclonal banding,  After extensive discussion with patient, we decided to proceed with ocrelizumab  Complete pretreatment laboratory evaluations  Repeat MRI of the brain and cervical spine with without contrast  Her varicella-zoster antibody was negative, written prescription for Shingrix vaccination  Bilateral lower extremity neuropathic pain,  Add on  gabapentin 300 mg 3 times daily  Vitamin D deficiency  Continue Vitamin D supplement  Total time spent reviewing the chart, obtaining history, examined patient, ordering tests, documentation, consultations and family, care coordination was 40 minutes   [name], M.D. Ph.D.  [facility] [address] [place], [place] [address] Ph: [phone] Fax: [phone]  CC: Renette Butters, MD

## 2020-09-12 LAB — COMPREHENSIVE METABOLIC PANEL
ALT: 9 IU/L (ref 0–32)
AST: 11 IU/L (ref 0–40)
Albumin/Globulin Ratio: 1.3 (ref 1.2–2.2)
Albumin: 4.5 g/dL (ref 3.8–4.8)
Alkaline Phosphatase: 99 IU/L (ref 44–121)
BUN/Creatinine Ratio: 14 (ref 9–23)
BUN: 10 mg/dL (ref 6–24)
Bilirubin Total: 0.2 mg/dL (ref 0.0–1.2)
CO2: 18 mmol/L — ABNORMAL LOW (ref 20–29)
Calcium: 9.5 mg/dL (ref 8.7–10.2)
Chloride: 105 mmol/L (ref 96–106)
Creatinine, Ser: 0.74 mg/dL (ref 0.57–1.00)
GFR calc Af Amer: 117 mL/min/{1.73_m2} (ref >59)
GFR calc non Af Amer: 102 mL/min/{1.73_m2} (ref >59)
Globulin, Total: 3.5 g/dL (ref 1.5–4.5)
Glucose: 81 mg/dL (ref 65–99)
Potassium: 4.4 mmol/L (ref 3.5–5.2)
Sodium: 137 mmol/L (ref 134–144)
Total Protein: 8 g/dL (ref 6.0–8.5)

## 2020-09-12 LAB — CBC WITH DIFFERENTIAL
Basophils Absolute: 0.1 10*3/uL (ref 0.0–0.2)
Basos: 1 %
EOS (ABSOLUTE): 0.2 10*3/uL (ref 0.0–0.4)
Eos: 3 %
Hematocrit: 32.8 % — ABNORMAL LOW (ref 34.0–46.6)
Hemoglobin: 10.5 g/dL — ABNORMAL LOW (ref 11.1–15.9)
Immature Grans (Abs): 0 10*3/uL (ref 0.0–0.1)
Immature Granulocytes: 0 %
Lymphocytes Absolute: 1.8 10*3/uL (ref 0.7–3.1)
Lymphs: 21 %
MCH: 26 pg — ABNORMAL LOW (ref 26.6–33.0)
MCHC: 32 g/dL (ref 31.5–35.7)
MCV: 81 fL (ref 79–97)
Monocytes Absolute: 0.4 10*3/uL (ref 0.1–0.9)
Monocytes: 4 %
Neutrophils Absolute: 6.3 10*3/uL (ref 1.4–7.0)
Neutrophils: 71 %
RBC: 4.04 x10E6/uL (ref 3.77–5.28)
RDW: 16.8 % — ABNORMAL HIGH (ref 11.7–15.4)
WBC: 8.8 10*3/uL (ref 3.4–10.8)

## 2020-09-12 LAB — VITAMIN D 25 HYDROXY (VIT D DEFICIENCY, FRACTURES): Vit D, 25-Hydroxy: 7.8 ng/mL — ABNORMAL LOW (ref 30.0–100.0)

## 2020-09-12 LAB — HEPATITIS B CORE ANTIBODY, TOTAL: Hep B Core Total Ab: NEGATIVE

## 2020-09-12 LAB — ANA W/REFLEX: Anti Nuclear Antibody (ANA): NEGATIVE

## 2020-09-12 LAB — VARICELLA ZOSTER ANTIBODY, IGG: Varicella zoster IgG: 511 index (ref >165)

## 2020-09-12 LAB — IGG, IGA, IGM
IgA/Immunoglobulin A, Serum: 266 mg/dL (ref 87–352)
IgG (Immunoglobin G), Serum: 1652 mg/dL — ABNORMAL HIGH (ref 586–1602)
IgM (Immunoglobulin M), Srm: 185 mg/dL (ref 26–217)

## 2020-09-12 LAB — VITAMIN B12: Vitamin B-12: 296 pg/mL (ref 232–1245)

## 2020-09-12 LAB — TSH: TSH: 0.946 u[IU]/mL (ref 0.450–4.500)

## 2020-09-12 LAB — HEPATITIS B SURFACE ANTIBODY,QUALITATIVE: Hep B Surface Ab, Qual: NONREACTIVE

## 2020-09-12 LAB — HEPATITIS B SURFACE ANTIGEN: Hepatitis B Surface Ag: NEGATIVE

## 2020-09-15 ENCOUNTER — Telehealth: Payer: Self-pay | Admitting: Neurology

## 2020-09-15 DIAGNOSIS — E559 Vitamin D deficiency, unspecified: Secondary | ICD-10-CM

## 2020-09-15 MED ORDER — VITAMIN D (ERGOCALCIFEROL) 1.25 MG (50000 UNIT) PO CAPS: 50000.0000 [IU] | ORAL_CAPSULE | ORAL | 0 refills | Status: DC

## 2020-09-15 NOTE — Telephone Encounter (Signed)
Medicaid [facility]c community auth: [id number] & [phone] (exp. 09/15/20 to 10/30/20) order sent to GI. They will reach out to the patient to schedule.

## 2020-09-15 NOTE — Telephone Encounter (Signed)
Please call patient, laboratory evaluation shows significantly decreased vitamin D 7.8,  I have called in vitamin D supplement, 50,000 weekly for 5 weeks and followed by counter vitamin D3 1000 units 2 tablets every day  There is also evidence of mild anemia, hemoglobin of 10.5, she would benefit over-the-counter multivitamin and iron supplement  Rest of the laboratory evaluation showed no significant abnormalities.

## 2020-09-15 NOTE — Telephone Encounter (Signed)
Called pt. Relayed results per Dr. Rhea Belton note. Pt verbalized understanding and appreciation.

## 2020-09-17 ENCOUNTER — Telehealth: Payer: Self-pay | Admitting: *Deleted

## 2020-09-17 NOTE — Telephone Encounter (Signed)
Received [facility] lab result from 09/11/20, 3.16 positive. Placed on MD desk for review.

## 2020-09-18 ENCOUNTER — Telehealth: Payer: Self-pay | Admitting: Neurology

## 2020-09-18 NOTE — Telephone Encounter (Signed)
Pt called, [facility] called me to notify me Medicaid do not pay for the Shingle vaccine. I would have to pay $200. Was instructed to call my physician to let them know. Would like a call from the nurse.

## 2020-09-18 NOTE — Telephone Encounter (Signed)
LVM informing patient our office doesn't prescribe or administer Shingle vaccines. Advised she call her PCP to discuss cost of Shingles vaccine.

## 2020-09-22 NOTE — Telephone Encounter (Signed)
I spoke with Dr. Krista Blue.  Since the Ocrevus PA and appeal were both denied patient may start Tecfidera.  Patient should follow up with Dr. Krista Blue in a few months.

## 2020-09-22 NOTE — Telephone Encounter (Signed)
I called [facility] appeal department.  The denial decision for Ocrevus has been upheld.  They will fax me this determination letter.

## 2020-09-22 NOTE — Telephone Encounter (Signed)
I called patient to discuss.  No answer, left a voicemail asking her to call me back.

## 2020-09-23 NOTE — Telephone Encounter (Signed)
I attempted a PA for Tecfidera via CMM.  Received this response from [facility]: "We received a prior authorization request for the member and product listed above. The Community and [facility] Prior Authorization Team is not able to review this request because requested medication does not require prior authorization. Please visit [email or web address] to view our most up-to-date Preferred Drug List (PDL), prior authorization forms, and additional pharmacy resources. (Select Menu> Health Plans by State> Select your state> Under Medicaid (Community Plan): [place] Information> Pharmacy Resources & Physician Administered Drugs)."

## 2020-09-23 NOTE — Telephone Encounter (Signed)
Patient returned my call.  I advised her that her insurance has denied the prior authorization and appeal for Ocrevus.  I advised her that Dr. Krista Blue recommends starting Tecfidera until she can be seen in the office again.  Advised her I will send the Tecfidera start form to [place].  It is likely that Tecfidera will also need prior authorization and I will work on this as soon as it is indicated. Patient is agreeable to this plan and.  I answered all of her questions and keep her updated.  Faxed Tecfidera start form to [place].  Received a receipt of confirmation.

## 2020-10-02 ENCOUNTER — Ambulatory Visit
Admission: RE | Admit: 2020-10-02 | Discharge: 2020-10-02 | Disposition: A | Discharge status: Home / Self Care | Payer: Medicaid Other | Source: Ambulatory Visit | Attending: Neurology | Admitting: Neurology

## 2020-10-02 ENCOUNTER — Other Ambulatory Visit: Payer: Self-pay

## 2020-10-02 DIAGNOSIS — G35 Multiple sclerosis: Secondary | ICD-10-CM | POA: Diagnosis not present

## 2020-10-02 MED ORDER — GADOBENATE DIMEGLUMINE 529 MG/ML IV SOLN
20.0000 mL | Freq: Once | INTRAVENOUS | Status: AC | PRN
  Administered 2020-10-02: 20 mL via INTRAVENOUS

## 2020-10-06 ENCOUNTER — Encounter: Payer: Self-pay | Admitting: Neurology

## 2020-10-06 ENCOUNTER — Telehealth: Payer: Self-pay | Admitting: Neurology

## 2020-10-06 NOTE — Telephone Encounter (Signed)
I called patient.  She started taking Tecfidera on March 4.  Other than mild GI upset she is tolerating Tecfidera well.  She reports that she may feel little nauseous after the dose and will lay down for a bit.  She does not experience nausea after every dose.  She will continue taking Tecfidera.  I reminded her of her appointment on March 10 with Dr. Krista Blue.  She verbalized understanding and appreciation.

## 2020-10-06 NOTE — Telephone Encounter (Addendum)
  IMPRESSION: This MRI of the brain with and without contrast shows the following: 1.   Multiple T2/FLAIR hyperintense foci in the cerebral hemispheres in a pattern and configuration consistent with chronic demyelinating plaque associated with multiple sclerosis.  None of the foci enhance or appear to be acute.  However, 2 foci in the right cerebral hemisphere present on the current MRI were not seen on the 2020 MRI and could represent interim progression. 2.   Normal enhancement pattern.  No acute findings.   IMPRESSION: This MRI of the cervical spine with and without contrast shows the following: 1.   T2 hyperintense focus within the central posterior spinal cord adjacent to C2.  Though nonspecific.  This is consistent with a chronic demyelinating plaque.  It does not enhance. 2.   Moderate spinal stenosis at C3-C4 and C5-C6 and minimal to mild spinal stenosis at C4-C5 and C6-C7.  There is no nerve root compression. 3.   Normal enhancement pattern.    Please call patient, MRI of the brain with without contrast showed 2 new foci at the right side of the brain, that was not seen at the previous MRI in 2020.  Continued evidence of multiple chronic demyelinating plaque  Labs cervical spine showed evidence of degenerative changes, also evidence of spinal cord involvement close to C2  There was no contrast-enhancement.

## 2020-10-06 NOTE — Telephone Encounter (Signed)
I have reached out to [employer] to check the status of patient's Tecfidera.

## 2020-10-06 NOTE — Telephone Encounter (Signed)
Received this notice from [place]: "We spoke to the patient on 3/2, looks like the shipment was scheduled to be delivered on March 4th."

## 2020-10-06 NOTE — Telephone Encounter (Signed)
Attempted to reach patient again. Requested her to call me back (provided our office hours).

## 2020-10-06 NOTE — Telephone Encounter (Signed)
Left message requesting a return call.

## 2020-10-07 NOTE — Telephone Encounter (Signed)
Pt returned phone call.  

## 2020-10-07 NOTE — Telephone Encounter (Signed)
I spoke to the patient and she verbalized understanding of her MRI results. She started Tecfidera on 10/03/20 and is tolerating the medication well. She will keep her pending follow up for further review of scans with Dr. Krista Blue.

## 2020-10-09 ENCOUNTER — Encounter: Payer: Self-pay | Admitting: Neurology

## 2020-10-09 ENCOUNTER — Ambulatory Visit: Payer: Medicaid Other | Admitting: Neurology

## 2020-10-09 VITALS — BP 148/82 | HR 74 | Ht 66.0 in | Wt 236.5 lb

## 2020-10-09 DIAGNOSIS — M792 Neuralgia and neuritis, unspecified: Secondary | ICD-10-CM | POA: Diagnosis not present

## 2020-10-09 DIAGNOSIS — E559 Vitamin D deficiency, unspecified: Secondary | ICD-10-CM | POA: Diagnosis not present

## 2020-10-09 DIAGNOSIS — G35 Multiple sclerosis: Secondary | ICD-10-CM | POA: Diagnosis not present

## 2020-10-09 NOTE — Progress Notes (Signed)
PATIENT: Denise Moses DOB: 1980-12-02  Chief Complaint  Patient presents with  . Follow-up    MS. She started Tecfidera on 10/03/20. Tolerating the medication well without any significant side effects.      HISTORICAL  Denise Moses is 40 year old female, seen in request by orthopedic surgeon Dr. Edmonia Lynch for evaluation of possible multiple sclerosis, her primary care physician is PA Fredia Beets, she is accompanied by her significant others at today's clinic visit on Sept 20, 2020  I have reviewed and summarized the referring note from the referring physician.  She is currently [redacted] weeks pregnant, per patient, she is going to have a elective abortion,  In July 2020, she had a sudden onset neck pain, radiating pain to bilateral shoulder, bilateral fingertips may 2, 3, fourth fingertips, also developed mild weak grip, at the peak of her difficulty, she also described difficulty walking, urinary urgency, incontinence, she was given a prescription of steroid, and gabapentin, which has helped her symptoms some, but still not back to her baseline, she still has bilateral upper extremity paresthesia, no longer have significant gait abnormality, lower extremity paresthesia, she denies visual loss.  I personally reviewed MRI of cervical spine on February 27, 2019, signal abnormality within the cord at C2,  UPDATE Sept 30 2020: She is scheduled for abortion on May 04, 2019, there is no new neurological symptoms,  We personally reviewed MRI of the brain without contrast April 29, 2019, multiple T2/flair hyperintensity in the pons, hemisphere, consistent with relapsing remitting multiple sclerosis,  Laboratory evaluation on April 12, 2019 showed decreased vitamin D 14.4, normal or negative angiotensin-converting enzyme level, ANA, 10, iron level, copper level was mildly elevated 225, hepatitis panel, A1c was 5.5, normal electrophoresis CPK was 24, ESR was 112, normal  TSH, C-reactive protein was mildly elevated 11, normal folic acid, HIV, RPR, H08,  UPDATE Jun 12 2019: She had a elective abortion C-section on May 04, 2019, we also called her primary care physician, she has lost the follow-up since 2018, missed multiple scheduled visit, was actually discharged by her primary care physician  She complains of worsening bilateral lower extremity swelling, burning pain, denies significant gait abnormality  We had extensive discussion with patient, and her significant others, she preferred to started ocrelizumab, emphasized with patient the importance to stay on contraceptives, will refer her to OB/GYN for options.  UPDATE Sep 11 2020: She had a C-section with healthy baby on March 23 2021, she is no longer breast-feeding, she , does complains of intermittent blurry vision, seems to noticed more difficulty walking post pattern, she denies falling bladder incontinence, she is now ready to receive treatment, had tubal ligation during C-section  We again personally reviewed MRI of the brain without contrast September 2020, multiple T2/flair hyperintensity foci in the pons, hemisphere, also with C2 involvement  UPDATE March 10 to 22: She complains of intermittent right-sided throbbing pain, gabapentin 300 mg 3 times daily works well, mild unsteady gait, continue have urinary urgency, but much improved  She is tolerating Tecfidera, with some flash, denies significant GI side effect, insurance will only pay ocrelizumab as second tier,  We personally reviewed MRI of brain, cervical with without contrast in March 2022, compared to previous scan, multiple T2/FLAIR hyperintensity foci in cerebral hemisphere, consistent with chronic demyelinating plaque, compared to previous scan in 2020, there was 2-3 new supratentorium lesions, no contrast-enhancement.  MRI of cervical spine showed lesion within the central posterior spinal cord adjacent to  C2, no significant canal  foraminal narrowing  REVIEW OF SYSTEMS: Full 14 system review of systems performed and notable only for as above All other review of systems were negative.  ALLERGIES: No Known Allergies  HOME MEDICATIONS: Current Outpatient Medications  Medication Sig Dispense Refill  . acetaminophen (TYLENOL) 325 MG tablet Take 2 tablets (650 mg total) by mouth every 6 (six) hours as needed for mild pain. 100 tablet 2  . Dimethyl Fumarate 240 MG CPDR Take 240 mg by mouth 2 (two) times daily.    . Ferrous Sulfate (IRON PO) Take by mouth.    . gabapentin (NEURONTIN) 300 MG capsule Take 1 capsule (300 mg total) by mouth 3 (three) times daily. 90 capsule 11  . Multiple Vitamin (MULTIVITAMIN ADULT PO) Take by mouth.    [name] NIFEdipine (PROCARDIA XL/NIFEDICAL-XL) 90 MG 24 hr tablet Take 1 tablet (90 mg total) by mouth daily. 90 tablet 3  . Vitamin D, Ergocalciferol, (DRISDOL) 1.25 MG (50000 UNIT) CAPS capsule Take 1 capsule (50,000 Units total) by mouth every 7 (seven) days. 5 capsule 0  . Zoster Vaccine Adjuvanted [facility]) injection Please complete the shingrix according to protocol prior to Ocrelizumab infusion 0.5 mL 0   No current facility-administered medications for this visit.    PAST MEDICAL HISTORY: Past Medical History:  Diagnosis Date  . Abnormal MRI, cervical spine   . Anemia   . Elective abortion   . Heart murmur   . Herpes   . Infection    UTI  . Pregnancy induced hypertension    2001  . Vitamin D deficiency     PAST SURGICAL HISTORY: Past Surgical History:  Procedure Laterality Date  . CESAREAN SECTION N/A 03/23/2020   Procedure: CESAREAN SECTION;  Surgeon: Jonnie Kind, MD;  Location: [facility];  Service: Obstetrics;  Laterality: N/A;  Arrest of Dilation  . CHOLECYSTECTOMY    . LAPAROSCOPY N/A 06/09/2016   Procedure: LAPAROSCOPY OPERATIVE;  Surgeon: Osborne Oman, MD;  Location: [facility];  Service: Gynecology;  Laterality: N/A;  ectopic   . THERAPEUTIC ABORTION     x 4  .  TUBAL LIGATION    . UNILATERAL SALPINGECTOMY Left 06/09/2016   Procedure: UNILATERAL SALPINGECTOMY, REMOVAL OF IUD;  Surgeon: Osborne Oman, MD;  Location: [facility];  Service: Gynecology;  Laterality: Left;  . WISDOM TOOTH EXTRACTION      FAMILY HISTORY: Family History  Problem Relation Age of Onset  . Breast cancer Mother 3       was a smoker, alcohol drinker in early life   . Breast cancer Maternal Aunt   . Cancer Maternal Grandfather        unsure of type  . Other Father        unsure of history  . Breast cancer Maternal Aunt     SOCIAL HISTORY: Social History   Socioeconomic History  . Marital status: Single    Spouse name: Not on file  . Number of children: 6  . Years of education: some college  . Highest education level: Not on file  Occupational History  . Occupation: Unemployed  Tobacco Use  . Smoking status: Former Smoker    Packs/day: 0.25    Years: 10.00    Pack years: 2.50    Types: Cigarettes    Quit date: 03/2019    Years since quitting: 1.6  . Smokeless tobacco: Never Used  . Tobacco comment: 2-3 cigs/day  Vaping Use  . Vaping Use: Never  used  Substance and Sexual Activity  . Alcohol use: Not Currently    Comment: social drinker  . Drug use: Yes    Frequency: 5.0 times per week    Types: Marijuana    Comment: last used before delivery  . Sexual activity: Not Currently    Partners: Male    Birth control/protection: Surgical  Other Topics Concern  . Not on file  Social History Narrative   Lives at home with children.   Right-handed.   No daily caffeine use.   Social Determinants of Health   Financial Resource Strain: Low Risk   . Difficulty of Paying Living Expenses: Not hard at all  Food Insecurity: Food Insecurity Present  . Worried About Programme researcher, broadcasting/film/video in the Last Year: Sometimes true  . Ran Out of Food in the Last Year: Sometimes true  Transportation Needs: No Transportation Needs  . Lack of Transportation (Medical): No  .  Lack of Transportation (Non-Medical): No  Physical Activity: Insufficiently Active  . Days of Exercise per Week: 2 days  . Minutes of Exercise per Session: 30 min  Stress: Stress Concern Present  . Feeling of Stress : To some extent  Social Connections: Moderately Isolated  . Frequency of Communication with Friends and Family: More than three times a week  . Frequency of Social Gatherings with Friends and Family: More than three times a week  . Attends Religious Services: More than 4 times per year  . Active Member of Clubs or Organizations: No  . Attends Banker Meetings: Never  . Marital Status: Never married  Intimate Partner Violence: Not At Risk  . Fear of Current or Ex-Partner: No  . Emotionally Abused: No  . Physically Abused: No  . Sexually Abused: No     PHYSICAL EXAM   Vitals:   10/09/20 0858  BP: (!) 148/82  Pulse: 74  Weight: 236 lb 8 oz (107.3 kg)  Height: 5\' 6"  (1.676 m)   Not recorded     Body mass index is 38.17 kg/m.  PHYSICAL EXAMNIATION:  Gen: NAD, conversant, well nourised, well groomed            NEUROLOGICAL EXAM:  MENTAL STATUS: Speech/cognition: Awake, alert, oriented to history taking and casual conversation   CRANIAL NERVES: CN II: Visual fields are full to confrontation. Pupils are round equal and briskly reactive to light. CN III, IV, VI: extraocular movement are normal. No ptosis. CN V: Facial sensation is intact to pinprick in all 3 divisions bilaterally.  CN VII: Face is symmetric with normal eye closure and smile. CN VIII: Hearing is normal to causal conversation. CN IX, X: Palate elevates symmetrically. Phonation is normal. CN XI: Head turning and shoulder shrug are intact   MOTOR: Mild lower extremity spasticity, no significant muscle weakness  REFLEXES: Reflexes are 3 and symmetric at the biceps, triceps, knees, and ankles. Plantar responses are flexor.  SENSORY: Intact to light touch, pinprick, positional  sensation and vibratory sensation are intact in fingers and toes.  COORDINATION: Rapid alternating movements and fine finger movements are intact. There is no dysmetria on finger-to-nose and heel-knee-shin.    GAIT/STANCE: She can get up from seated position without pushing off, mildly unsteady, mild difficulty with tandem walking    DIAGNOSTIC DATA (LABS, IMAGING, TESTING) - I reviewed patient records, labs, notes, testing and imaging myself where available.   ASSESSMENT AND PLAN  Shakevia Sarris is a 40 y.o. female   Relapsing remitting multiple  sclerosis  Initial presentation was neck pain, radiating bilateral upper extremity, gait abnormality, urinary urgency, incontinence in July 2020,  Diagnosis was confirmed by abnormal MRI of the brain, cervical spine, CSF showed more than 5 oligoclonal banding  Shortly after diagnosis, she become pregnant, had a elective abortion on May 04, 2019, then become pregnant again, had C-section on March 23, 2020, with tubal ligation   Insurance denied ocrelizumab, started Tecfidera since October 03, 2020, complains of flushing, otherwise tolerating it.  Laboratory evaluation showed varicella-zoster antibody, but syrinx vaccination was not paid by her insurance  Repeat MRI of the brain with without contrast showed showed 2- 3 new supratentorium lesion compared to previous scan in September 2020, continued evidence of cervical cord involvement, no contrast-enhancement  Repeat MRI of the brain with without contrast in 1 year, depending clinical flareup, or increased lesion load-taking Tecfidera, will switch to ocrelizumab iron infusion   Bilateral lower extremity neuropathic pain,   gabapentin 300 mg 3 times daily was helpful  Vitamin D deficiency  Continue Vitamin D supplement    [name], M.D. Ph.D.  [facility] [address], [place], [place] [address] Ph: [phone] Fax: [phone]  CC: Renette Butters, MD

## 2020-10-28 ENCOUNTER — Other Ambulatory Visit: Payer: Self-pay | Admitting: Neurology

## 2020-11-04 ENCOUNTER — Telehealth: Payer: Self-pay | Admitting: Neurology

## 2020-11-04 NOTE — Telephone Encounter (Signed)
[employer] [facility]) called, faxed a PA for  Dimethyl Fumarate 240 MG CPDR on 3/24. Calling to see if you need assistance with the PA.   Contact info: [phone] Reference key: [id number]

## 2020-11-04 NOTE — Telephone Encounter (Signed)
Note from Dinkins, Cyril Mourning, RN on 09/23/2020:       I attempted a PA for Tecfidera via CMM.  Received this response from [facility]: "We received a prior authorization request for the member and product listed above. The Community and [facility] Prior Authorization Team is not able to review this request because requested medication does not require prior authorization. Please visit [email or web address] to view our most up-to-date Preferred Drug List (PDL), prior authorization forms, and additional pharmacy resources. (Select Menu> Health Plans by State> Select your state> Under Medicaid (Community Plan): [place] Information> Pharmacy Resources & Physician Administered Drugs)."

## 2020-11-04 NOTE — Addendum Note (Signed)
Addended by: Lester [place] A on: 11/04/2020 04:45 PM   Modules accepted: Orders

## 2020-11-04 NOTE — Telephone Encounter (Signed)
The patient is on brand name Tecfidera and receiving the drug. See below - no PA required. Minette Brine was calling from [facility] and the patient's health history cannot be discussed with her.

## 2021-04-20 ENCOUNTER — Ambulatory Visit: Payer: Medicaid Other | Admitting: Neurology

## 2021-04-20 ENCOUNTER — Encounter: Payer: Self-pay | Admitting: Neurology

## 2021-04-27 ENCOUNTER — Ambulatory Visit: Payer: Self-pay | Admitting: Nurse Practitioner

## 2021-04-28 ENCOUNTER — Other Ambulatory Visit: Payer: Self-pay

## 2021-04-28 ENCOUNTER — Ambulatory Visit (INDEPENDENT_AMBULATORY_CARE_PROVIDER_SITE_OTHER): Payer: Medicaid Other | Admitting: Nurse Practitioner

## 2021-04-28 ENCOUNTER — Encounter: Payer: Self-pay | Admitting: Nurse Practitioner

## 2021-04-28 VITALS — BP 131/77 | HR 68 | Temp 97.8°F | Ht 66.0 in | Wt 222.0 lb

## 2021-04-28 DIAGNOSIS — D1779 Benign lipomatous neoplasm of other sites: Secondary | ICD-10-CM

## 2021-04-28 DIAGNOSIS — G35 Multiple sclerosis: Secondary | ICD-10-CM | POA: Diagnosis not present

## 2021-04-28 DIAGNOSIS — L732 Hidradenitis suppurativa: Secondary | ICD-10-CM

## 2021-04-28 DIAGNOSIS — Z0000 Encounter for general adult medical examination without abnormal findings: Secondary | ICD-10-CM

## 2021-04-28 LAB — POCT GLYCOSYLATED HEMOGLOBIN (HGB A1C): Hemoglobin A1C: 5.3 % (ref 4.0–5.6)

## 2021-04-28 NOTE — Patient Instructions (Signed)
You were seen today in the [facility] to establish care. Labs were collected, results will be available via MyChart or, if abnormal, you will be contacted by clinic staff. Please follow up in 6 mths for reevaluation.

## 2021-04-28 NOTE — Progress Notes (Signed)
[place], [place]  [address] Phone:  [phone]   Fax:  [phone] Subjective:   Patient ID: Denise Moses, female    DOB: 1981-06-24, 40 y.o.   MRN: [id number]  Chief Complaint  Patient presents with   Follow-up    abscess under arm, uses dial antibiotic soap, hot rags. With com relief.   HPI Denise Moses 40 y.o. female presents with history of MS, heart murmur and vitamin D deficiency to the [facility] to establish care.   Patient states that she has abscess to bilateral axilla. States that this is chronic and they typically resolve without intervention and recur. Has had abscesses for several months. Denies any pain and/ drainage from sites at this time. Also endorses mass to buttocks that has been gradually increasing in size over the past 1.5 yrs. Denis any pain or drainage from site.   Denies any fatigue, chest pain, shortness of breath, HA or dizziness. Denies any blurred vision, numbness or tingling. Denies any fever. Has never been evaluated for mass on buttocks. Has been frequently treated for abscesses on axilla in the ED in the past.   Currently compliant with medications for MS and maintains appointments with neurology. Exercises daily, but denies adhering to any specific diet. Denies any other complaints today.    Past Medical History:  Diagnosis Date   Abnormal MRI, cervical spine    Anemia    Elective abortion    Heart murmur    Herpes    Infection    UTI   Pregnancy induced hypertension    2001   Vitamin D deficiency     Past Surgical History:  Procedure Laterality Date   CESAREAN SECTION N/A 03/23/2020   Procedure: CESAREAN SECTION;  Surgeon: Jonnie Kind, MD;  Location: [facility];  Service: Obstetrics;  Laterality: N/A;  Arrest of Dilation   CHOLECYSTECTOMY     LAPAROSCOPY N/A 06/09/2016   Procedure: LAPAROSCOPY OPERATIVE;  Surgeon: Osborne Oman, MD;  Location: [facility];  Service:  Gynecology;  Laterality: N/A;  ectopic    THERAPEUTIC ABORTION     x 4   TUBAL LIGATION     UNILATERAL SALPINGECTOMY Left 06/09/2016   Procedure: UNILATERAL SALPINGECTOMY, REMOVAL OF IUD;  Surgeon: Osborne Oman, MD;  Location: [facility];  Service: Gynecology;  Laterality: Left;   WISDOM TOOTH EXTRACTION      Family History  Problem Relation Age of Onset   Breast cancer Mother 30       was a smoker, alcohol drinker in early life    Breast cancer Maternal Aunt    Cancer Maternal Grandfather        unsure of type   Other Father        unsure of history   Breast cancer Maternal Aunt     Social History   Socioeconomic History   Marital status: Single    Spouse name: Not on file   Number of children: 6   Years of education: some college   Highest education level: Not on file  Occupational History   Occupation: Unemployed  Tobacco Use   Smoking status: Former    Packs/day: 0.25    Years: 10.00    Pack years: 2.50    Types: Cigarettes    Quit date: 03/2019    Years since quitting: 2.1   Smokeless tobacco: Never   Tobacco comments:    2-3 cigs/day  Vaping Use  Vaping Use: Never used  Substance and Sexual Activity   Alcohol use: Not Currently    Comment: social drinker   Drug use: Yes    Frequency: 5.0 times per week    Types: Marijuana    Comment: last used before delivery   Sexual activity: Not Currently    Partners: Male    Birth control/protection: Surgical  Other Topics Concern   Not on file  Social History Narrative   Lives at home with children.   Right-handed.   No daily caffeine use.   Social Determinants of Radio broadcast assistant Strain: Not on file  Food Insecurity: Food Insecurity Present   Worried About Charity fundraiser in the Last Year: Sometimes true   Arboriculturist in the Last Year: Sometimes true  Transportation Needs: Not on file  Physical Activity: Insufficiently Active   Days of Exercise per Week: 2 days   Minutes of Exercise  per Session: 30 min  Stress: Not on file  Social Connections: Not on file  Intimate Partner Violence: Not on file    Outpatient Medications Prior to Visit  Medication Sig Dispense Refill   Ferrous Sulfate (IRON PO) Take by mouth. (Patient not taking: Reported on 04/28/2021)     gabapentin (NEURONTIN) 300 MG capsule Take 1 capsule (300 mg total) by mouth 3 (three) times daily. (Patient not taking: Reported on 04/28/2021) 90 capsule 11   Multiple Vitamin (MULTIVITAMIN ADULT PO) Take by mouth. (Patient not taking: Reported on 04/28/2021)     NIFEdipine (PROCARDIA XL/NIFEDICAL-XL) 90 MG 24 hr tablet Take 1 tablet (90 mg total) by mouth daily. (Patient not taking: Reported on 04/28/2021) 90 tablet 3   TECFIDERA 240 MG CPDR Take 240 mg by mouth 2 (two) times daily. (Patient not taking: Reported on 04/28/2021)     Vitamin D, Ergocalciferol, (DRISDOL) 1.25 MG (50000 UNIT) CAPS capsule Take 1 capsule (50,000 Units total) by mouth every 7 (seven) days. (Patient not taking: Reported on 04/28/2021) 5 capsule 0   Zoster Vaccine Adjuvanted [facility]) injection Please complete the shingrix according to protocol prior to Ocrelizumab infusion (Patient not taking: Reported on 04/28/2021) 0.5 mL 0   No facility-administered medications prior to visit.    No Known Allergies  Review of Systems  Constitutional:  Negative for chills, fever and malaise/fatigue.  HENT: Negative.    Eyes: Negative.   Respiratory:  Negative for cough and shortness of breath.   Cardiovascular:  Negative for chest pain, palpitations and leg swelling.  Gastrointestinal:  Negative for abdominal pain, blood in stool, constipation, diarrhea, nausea and vomiting.  Genitourinary: Negative.   Musculoskeletal: Negative.   Skin:        See HPI  Neurological: Negative.   Psychiatric/Behavioral:  Negative for depression. The patient is not nervous/anxious.   All other systems reviewed and are negative.     Objective:    Physical  Exam Vitals reviewed.  Constitutional:      General: She is not in acute distress.    Appearance: Normal appearance.  HENT:     Head: Normocephalic.     Right Ear: Tympanic membrane, ear canal and external ear normal.     Left Ear: Tympanic membrane, ear canal and external ear normal.     Nose: Nose normal.  Eyes:     Extraocular Movements: Extraocular movements intact.     Conjunctiva/sclera: Conjunctivae normal.     Pupils: Pupils are equal, round, and reactive to light.  Cardiovascular:  Rate and Rhythm: Normal rate and regular rhythm.     Pulses: Normal pulses.     Heart sounds: Normal heart sounds.     Comments: No obvious peripheral edema Pulmonary:     Effort: Pulmonary effort is normal.     Breath sounds: Normal breath sounds.  Musculoskeletal:        General: Normal range of motion.     Cervical back: Normal range of motion and neck supple.  Skin:    General: Skin is warm and dry.     Capillary Refill: Capillary refill takes less than 2 seconds.     Comments: Fluctuant mass noted to left buttocks, fixed, consistent with cyst/lipoma. Mass is non-tender to palpation with no erythema or other discoloration. No masses noted to bilateral axilla, mostly scar tissue. Non tender to palpation with no erythema/ discoloration.  Neurological:     General: No focal deficit present.     Mental Status: She is alert and oriented to person, place, and time.  Psychiatric:        Mood and Affect: Mood normal.        Behavior: Behavior normal.        Thought Content: Thought content normal.        Judgment: Judgment normal.    BP 131/77 (BP Location: Right Arm, Patient Position: Sitting)   Pulse 68   Temp 97.8 F (36.6 C)   Ht 5\' 6"  (1.676 m)   Wt 222 lb (100.7 kg)   SpO2 100%   BMI 35.83 kg/m  Wt Readings from Last 3 Encounters:  04/28/21 222 lb (100.7 kg)  10/09/20 236 lb 8 oz (107.3 kg)  09/11/20 236 lb 8 oz (107.3 kg)    Immunization History  Administered Date(s)  Administered   Tdap 03/02/2020    Diabetic Foot Exam - Simple   No data filed     Lab Results  Component Value Date   TSH 0.946 09/11/2020   Lab Results  Component Value Date   WBC 8.8 09/11/2020   HGB 10.5 (L) 09/11/2020   HCT 32.8 (L) 09/11/2020   MCV 81 09/11/2020   PLT 312 05/07/2020   Lab Results  Component Value Date   NA 137 09/11/2020   K 4.4 09/11/2020   CO2 18 (L) 09/11/2020   GLUCOSE 81 09/11/2020   BUN 10 09/11/2020   CREATININE 0.74 09/11/2020   BILITOT 0.2 09/11/2020   ALKPHOS 99 09/11/2020   AST 11 09/11/2020   ALT 9 09/11/2020   PROT 8.0 09/11/2020   ALBUMIN 4.5 09/11/2020   CALCIUM 9.5 09/11/2020   ANIONGAP 8 03/24/2020   Lab Results  Component Value Date   CHOL 146 06/30/2017   Lab Results  Component Value Date   HDL 54 06/30/2017   Lab Results  Component Value Date   LDLCALC 70 06/30/2017   Lab Results  Component Value Date   TRIG 110 06/30/2017   Lab Results  Component Value Date   CHOLHDL 2.7 06/30/2017   Lab Results  Component Value Date   HGBA1C 5.3 04/28/2021   HGBA1C 5.2 09/17/2019   HGBA1C 5.5 04/12/2019       Assessment & Plan:   Problem List Items Addressed This Visit   None Visit Diagnoses     MS (multiple sclerosis) ([place])    -  Primary   Healthcare maintenance       Relevant Orders   CBC with Differential/Platelet   Comprehensive metabolic panel   Lipid panel  POCT glycosylated hemoglobin (Hb A1C) (Completed) Encouraged continued diet and exercise efforts  Encouraged continued compliance with medication     Hidradenitis suppurativa       Relevant Orders   Ambulatory referral to Dermatology   Lipoma of other specified sites       Relevant Orders   Ambulatory referral to Dermatology   Follow up in 6 mths for reevaluation of symptoms, sooner as needed     I am having Denise Moses maintain her NIFEdipine, Shingrix, gabapentin, Vitamin D (Ergocalciferol), Ferrous Sulfate (IRON PO), Multiple Vitamin  (MULTIVITAMIN ADULT PO), and Tecfidera.  No orders of the defined types were placed in this encounter.    Teena Dunk, NP

## 2021-04-29 LAB — CBC WITH DIFFERENTIAL/PLATELET
Basophils Absolute: 0.1 10*3/uL (ref 0.0–0.2)
Basos: 1 %
EOS (ABSOLUTE): 0.2 10*3/uL (ref 0.0–0.4)
Eos: 3 %
Hematocrit: 31.9 % — ABNORMAL LOW (ref 34.0–46.6)
Hemoglobin: 10 g/dL — ABNORMAL LOW (ref 11.1–15.9)
Immature Grans (Abs): 0 10*3/uL (ref 0.0–0.1)
Immature Granulocytes: 0 %
Lymphocytes Absolute: 2.3 10*3/uL (ref 0.7–3.1)
Lymphs: 36 %
MCH: 24.8 pg — ABNORMAL LOW (ref 26.6–33.0)
MCHC: 31.3 g/dL — ABNORMAL LOW (ref 31.5–35.7)
MCV: 79 fL (ref 79–97)
Monocytes Absolute: 0.5 10*3/uL (ref 0.1–0.9)
Monocytes: 8 %
Neutrophils Absolute: 3.3 10*3/uL (ref 1.4–7.0)
Neutrophils: 52 %
Platelets: 300 10*3/uL (ref 150–450)
RBC: 4.03 x10E6/uL (ref 3.77–5.28)
RDW: 15.9 % — ABNORMAL HIGH (ref 11.7–15.4)
WBC: 6.4 10*3/uL (ref 3.4–10.8)

## 2021-04-29 LAB — LIPID PANEL
Chol/HDL Ratio: 2.6 ratio (ref 0.0–4.4)
Cholesterol, Total: 140 mg/dL (ref 100–199)
HDL: 54 mg/dL (ref >39)
LDL Chol Calc (NIH): 65 mg/dL (ref 0–99)
Triglycerides: 119 mg/dL (ref 0–149)
VLDL Cholesterol Cal: 21 mg/dL (ref 5–40)

## 2021-04-29 LAB — COMPREHENSIVE METABOLIC PANEL
ALT: 9 IU/L (ref 0–32)
AST: 13 IU/L (ref 0–40)
Albumin/Globulin Ratio: 1.6 (ref 1.2–2.2)
Albumin: 4.6 g/dL (ref 3.8–4.8)
Alkaline Phosphatase: 80 IU/L (ref 44–121)
BUN/Creatinine Ratio: 9 (ref 9–23)
BUN: 8 mg/dL (ref 6–24)
Bilirubin Total: <0.2 mg/dL (ref 0.0–1.2)
CO2: 22 mmol/L (ref 20–29)
Calcium: 9.2 mg/dL (ref 8.7–10.2)
Chloride: 104 mmol/L (ref 96–106)
Creatinine, Ser: 0.86 mg/dL (ref 0.57–1.00)
Globulin, Total: 2.8 g/dL (ref 1.5–4.5)
Glucose: 80 mg/dL (ref 70–99)
Potassium: 3.9 mmol/L (ref 3.5–5.2)
Sodium: 139 mmol/L (ref 134–144)
Total Protein: 7.4 g/dL (ref 6.0–8.5)
eGFR: 87 mL/min/{1.73_m2} (ref >59)

## 2021-06-16 ENCOUNTER — Encounter: Payer: Self-pay | Admitting: Nurse Practitioner

## 2021-06-16 ENCOUNTER — Other Ambulatory Visit: Payer: Self-pay

## 2021-06-16 ENCOUNTER — Ambulatory Visit (INDEPENDENT_AMBULATORY_CARE_PROVIDER_SITE_OTHER): Payer: Medicaid Other | Admitting: Nurse Practitioner

## 2021-06-16 VITALS — BP 135/76 | HR 66 | Temp 97.8°F | Ht 66.0 in | Wt 214.0 lb

## 2021-06-16 DIAGNOSIS — N898 Other specified noninflammatory disorders of vagina: Secondary | ICD-10-CM | POA: Diagnosis not present

## 2021-06-16 LAB — POCT URINALYSIS DIP (CLINITEK)
Bilirubin, UA: NEGATIVE
Blood, UA: NEGATIVE
Glucose, UA: NEGATIVE mg/dL
Nitrite, UA: NEGATIVE
POC PROTEIN,UA: NEGATIVE
Spec Grav, UA: 1.025 (ref 1.010–1.025)
Urobilinogen, UA: 0.2 E.U./dL
pH, UA: 6.5 (ref 5.0–8.0)

## 2021-06-16 MED ORDER — DOXYCYCLINE HYCLATE 100 MG PO TABS: 100.0000 mg | ORAL_TABLET | Freq: Two times a day (BID) | ORAL | 0 refills | Status: AC

## 2021-06-16 MED ORDER — CEFTRIAXONE SODIUM 1 G IJ SOLR
1.0000 g | Freq: Once | INTRAMUSCULAR | Status: AC
  Administered 2021-06-16: 1 g via INTRAMUSCULAR

## 2021-06-16 NOTE — Patient Instructions (Signed)
You were seen today in the [facility] for vaginal discomfort. Labs were collected, results will be available via MyChart or, if abnormal, you will be contacted by clinic staff. You were prescribed medications, please take as directed. Please follow up in 2 wks as needed

## 2021-06-16 NOTE — Progress Notes (Signed)
[place], [place]  [address] Phone:  [phone]   Fax:  [phone] Subjective:   Patient ID: Denise Moses, female    DOB: [birth year], 40 y.o.   MRN: [id number]  Chief Complaint  Patient presents with   Exposure to STD    Vaginal itch, burning,yellow/greenish discharge sx started 1 week ago.     Frankye Schwegel 40 y.o. female  has a past medical history of Abnormal MRI, cervical spine, Anemia, Elective abortion, Heart murmur, Herpes, Infection, Pregnancy induced hypertension, and Vitamin D deficiency. To the [facility] for vaginal discharge and itching. Patient  states that she has had symptoms x 1 wk. Vaginal discharge yellow and green. Has had one partner, female, in the past 6 mths unprotected. Endorses having similar symptoms in the past, and being diagnosed with unknown STD. Endorses some pelvic pain, but denies any dysuria. Has not taken any medications for symptoms.   Denies any other complaints today. Denies any fever. Denies any fatigue, chest pain, shortness of breath, HA or dizziness. Denies any blurred vision, numbness or tingling.  Past Medical History:  Diagnosis Date   Abnormal MRI, cervical spine    Anemia    Elective abortion    Heart murmur    Herpes    Infection    UTI   Pregnancy induced hypertension    2001   Vitamin D deficiency     Past Surgical History:  Procedure Laterality Date   CESAREAN SECTION N/A 03/23/2020   Procedure: CESAREAN SECTION;  Surgeon: Jonnie Kind, MD;  Location: [facility];  Service: Obstetrics;  Laterality: N/A;  Arrest of Dilation   CHOLECYSTECTOMY     LAPAROSCOPY N/A 06/09/2016   Procedure: LAPAROSCOPY OPERATIVE;  Surgeon: Osborne Oman, MD;  Location: [facility];  Service: Gynecology;  Laterality: N/A;  ectopic    THERAPEUTIC ABORTION     x 4   TUBAL LIGATION     UNILATERAL SALPINGECTOMY Left 06/09/2016   Procedure: UNILATERAL SALPINGECTOMY, REMOVAL OF IUD;  Surgeon:  Osborne Oman, MD;  Location: [facility];  Service: Gynecology;  Laterality: Left;   WISDOM TOOTH EXTRACTION      Family History  Problem Relation Age of Onset   Breast cancer Mother 84       was a smoker, alcohol drinker in early life    Breast cancer Maternal Aunt    Cancer Maternal Grandfather        unsure of type   Other Father        unsure of history   Breast cancer Maternal Aunt     Social History   Socioeconomic History   Marital status: Single    Spouse name: Not on file   Number of children: 6   Years of education: some college   Highest education level: Not on file  Occupational History   Occupation: Unemployed  Tobacco Use   Smoking status: Former    Packs/day: 0.25    Years: 10.00    Pack years: 2.50    Types: Cigarettes    Quit date: 03/2019    Years since quitting: 2.2   Smokeless tobacco: Never   Tobacco comments:    2-3 cigs/day  Vaping Use   Vaping Use: Never used  Substance and Sexual Activity   Alcohol use: Not Currently    Comment: social drinker   Drug use: Yes    Frequency: 5.0 times per week  Types: Marijuana    Comment: last used before delivery   Sexual activity: Not Currently    Partners: Male    Birth control/protection: Surgical  Other Topics Concern   Not on file  Social History Narrative   Lives at home with children.   Right-handed.   No daily caffeine use.   Social Determinants of Health   Financial Resource Strain: Not on file  Food Insecurity: Not on file  Transportation Needs: Not on file  Physical Activity: Not on file  Stress: Not on file  Social Connections: Not on file  Intimate Partner Violence: Not on file    Outpatient Medications Prior to Visit  Medication Sig Dispense Refill   Ferrous Sulfate (IRON PO) Take by mouth. (Patient not taking: No sig reported)     gabapentin (NEURONTIN) 300 MG capsule Take 1 capsule (300 mg total) by mouth 3 (three) times daily. (Patient not taking: No sig reported) 90  capsule 11   Multiple Vitamin (MULTIVITAMIN ADULT PO) Take by mouth. (Patient not taking: No sig reported)     NIFEdipine (PROCARDIA XL/NIFEDICAL-XL) 90 MG 24 hr tablet Take 1 tablet (90 mg total) by mouth daily. (Patient not taking: Reported on 04/28/2021) 90 tablet 3   TECFIDERA 240 MG CPDR Take 240 mg by mouth 2 (two) times daily. (Patient not taking: No sig reported)     Vitamin D, Ergocalciferol, (DRISDOL) 1.25 MG (50000 UNIT) CAPS capsule Take 1 capsule (50,000 Units total) by mouth every 7 (seven) days. (Patient not taking: No sig reported) 5 capsule 0   Zoster Vaccine Adjuvanted [facility]) injection Please complete the shingrix according to protocol prior to Ocrelizumab infusion (Patient not taking: No sig reported) 0.5 mL 0   No facility-administered medications prior to visit.    No Known Allergies  Review of Systems  Constitutional:  Negative for chills, fever and malaise/fatigue.  Respiratory:  Negative for cough and shortness of breath.   Cardiovascular:  Negative for chest pain, palpitations and leg swelling.  Gastrointestinal:  Negative for abdominal pain, blood in stool, constipation, diarrhea, nausea and vomiting.  Genitourinary:        See HPI  Skin: Negative.   Neurological: Negative.   Psychiatric/Behavioral:  Negative for depression. The patient is not nervous/anxious.   All other systems reviewed and are negative.     Objective:    Physical Exam Vitals reviewed.  Constitutional:      General: She is not in acute distress.    Appearance: Normal appearance.  HENT:     Head: Normocephalic.  Cardiovascular:     Rate and Rhythm: Normal rate and regular rhythm.     Pulses: Normal pulses.     Heart sounds: Normal heart sounds.     Comments: No obvious peripheral edema Pulmonary:     Effort: Pulmonary effort is normal.     Breath sounds: Normal breath sounds.  Abdominal:     General: Abdomen is flat. Bowel sounds are normal. There is no distension.      Palpations: Abdomen is soft.     Tenderness: There is no abdominal tenderness.  Genitourinary:    Comments: Exam deferred Skin:    General: Skin is warm and dry.     Capillary Refill: Capillary refill takes less than 2 seconds.  Neurological:     General: No focal deficit present.     Mental Status: She is alert and oriented to person, place, and time.  Psychiatric:        Mood  and Affect: Mood normal.        Behavior: Behavior normal.        Thought Content: Thought content normal.        Judgment: Judgment normal.    BP 135/76 (BP Location: Left Arm, Patient Position: Sitting)   Pulse 66   Temp 97.8 F (36.6 C)   Ht $R'5\' 6"'QX$  (1.676 m)   Wt 214 lb (97.1 kg)   SpO2 100%   Breastfeeding No   BMI 34.54 kg/m  Wt Readings from Last 3 Encounters:  06/16/21 214 lb (97.1 kg)  04/28/21 222 lb (100.7 kg)  10/09/20 236 lb 8 oz (107.3 kg)    Immunization History  Administered Date(s) Administered   Tdap 03/02/2020    Diabetic Foot Exam - Simple   No data filed     Lab Results  Component Value Date   TSH 0.946 09/11/2020   Lab Results  Component Value Date   WBC 6.4 04/28/2021   HGB 10.0 (L) 04/28/2021   HCT 31.9 (L) 04/28/2021   MCV 79 04/28/2021   PLT 300 04/28/2021   Lab Results  Component Value Date   NA 139 04/28/2021   K 3.9 04/28/2021   CO2 22 04/28/2021   GLUCOSE 80 04/28/2021   BUN 8 04/28/2021   CREATININE 0.86 04/28/2021   BILITOT <0.2 04/28/2021   ALKPHOS 80 04/28/2021   AST 13 04/28/2021   ALT 9 04/28/2021   PROT 7.4 04/28/2021   ALBUMIN 4.6 04/28/2021   CALCIUM 9.2 04/28/2021   ANIONGAP 8 03/24/2020   EGFR 87 04/28/2021   Lab Results  Component Value Date   CHOL 140 04/28/2021   CHOL 146 06/30/2017   Lab Results  Component Value Date   HDL 54 04/28/2021   HDL 54 06/30/2017   Lab Results  Component Value Date   LDLCALC 65 04/28/2021   LDLCALC 70 06/30/2017   Lab Results  Component Value Date   TRIG 119 04/28/2021   TRIG 110  06/30/2017   Lab Results  Component Value Date   CHOLHDL 2.6 04/28/2021   CHOLHDL 2.7 06/30/2017   Lab Results  Component Value Date   HGBA1C 5.3 04/28/2021   HGBA1C 5.2 09/17/2019   HGBA1C 5.5 04/12/2019       Assessment & Plan:   Problem List Items Addressed This Visit   None Visit Diagnoses     Vaginal discharge    -  Primary   Relevant Medications   cefTRIAXone (ROCEPHIN) injection 1 g (Completed)   doxycycline (VIBRA-TABS) 100 MG tablet   Other Relevant Orders   GC/Chlamydia Probe Amp([facility])   NuSwab Vaginitis Plus (VG+)   HIV antibody (with reflex)   RPR   HSV(herpes simplex vrs) 1+2 ab-IgG Discussed safe sex practices  Informed to take OTC medications as needed in addition to prescribed medications for management of symptoms  Patient treated prophylacticallly for GC/chlamydia. Informed that if other results determined to positive, will prescribe additional medications.    Follow up in 1-2 wks  if symptoms worsen or do not improve, sooner as needed    I am having Keshonda C. Kithcart start on doxycycline. I am also having her maintain her NIFEdipine, Shingrix, gabapentin, Vitamin D (Ergocalciferol), Ferrous Sulfate (IRON PO), Multiple Vitamin (MULTIVITAMIN ADULT PO), and Tecfidera. We administered cefTRIAXone.  Meds ordered this encounter  Medications   cefTRIAXone (ROCEPHIN) injection 1 g   doxycycline (VIBRA-TABS) 100 MG tablet    Sig: Take 1 tablet (100 mg total) by  mouth 2 (two) times daily for 7 days.    Dispense:  14 tablet    Refill:  0     Teena Dunk, NP

## 2021-06-17 LAB — RPR: RPR Ser Ql: NONREACTIVE

## 2021-06-17 LAB — HIV ANTIBODY (ROUTINE TESTING W REFLEX): HIV Screen 4th Generation wRfx: NONREACTIVE

## 2021-06-17 LAB — HSV(HERPES SIMPLEX VRS) I + II AB-IGG
HSV 1 Glycoprotein G Ab, IgG: 38.2 index — ABNORMAL HIGH (ref 0.00–0.90)
HSV 2 IgG, Type Spec: 12.5 index — ABNORMAL HIGH (ref 0.00–0.90)

## 2021-06-18 ENCOUNTER — Other Ambulatory Visit: Payer: Self-pay | Admitting: Nurse Practitioner

## 2021-06-18 DIAGNOSIS — N760 Acute vaginitis: Secondary | ICD-10-CM

## 2021-06-18 LAB — GC/CHLAMYDIA PROBE AMP
Chlamydia trachomatis, NAA: NEGATIVE
Neisseria Gonorrhoeae by PCR: NEGATIVE

## 2021-06-18 MED ORDER — METRONIDAZOLE 500 MG PO TABS: 500.0000 mg | ORAL_TABLET | Freq: Two times a day (BID) | ORAL | 0 refills | Status: AC

## 2021-06-19 LAB — NUSWAB VAGINITIS PLUS (VG+)
Atopobium vaginae: HIGH Score — AB
BVAB 2: HIGH Score — AB
Candida albicans, NAA: POSITIVE — AB
Candida glabrata, NAA: NEGATIVE
Chlamydia trachomatis, NAA: NEGATIVE
Megasphaera 1: HIGH Score — AB
Neisseria gonorrhoeae, NAA: NEGATIVE
Trich vag by NAA: NEGATIVE

## 2021-06-22 ENCOUNTER — Other Ambulatory Visit: Payer: Self-pay | Admitting: Nurse Practitioner

## 2021-06-22 DIAGNOSIS — B3731 Acute candidiasis of vulva and vagina: Secondary | ICD-10-CM

## 2021-06-22 MED ORDER — FLUCONAZOLE 150 MG PO TABS: 150.0000 mg | ORAL_TABLET | Freq: Once | ORAL | 2 refills | Status: AC

## 2021-10-26 ENCOUNTER — Ambulatory Visit: Payer: Self-pay | Admitting: Nurse Practitioner

## 2021-12-24 ENCOUNTER — Ambulatory Visit (INDEPENDENT_AMBULATORY_CARE_PROVIDER_SITE_OTHER): Payer: Medicaid Other | Admitting: Nurse Practitioner

## 2021-12-24 ENCOUNTER — Encounter: Payer: Self-pay | Admitting: Nurse Practitioner

## 2021-12-24 VITALS — BP 108/60 | HR 79 | Temp 97.6°F | Ht 66.0 in | Wt 200.4 lb

## 2021-12-24 DIAGNOSIS — N898 Other specified noninflammatory disorders of vagina: Secondary | ICD-10-CM

## 2021-12-24 MED ORDER — METRONIDAZOLE 500 MG PO TABS: 500.0000 mg | ORAL_TABLET | Freq: Two times a day (BID) | ORAL | 0 refills | Status: AC

## 2021-12-24 NOTE — Patient Instructions (Signed)
You were seen today in the [facility] for vaginal itching. Labs were collected, results will be available via MyChart or, if abnormal, you will be contacted by clinic staff. You were prescribed medications, please take as directed. Please follow up as needed.

## 2021-12-24 NOTE — Progress Notes (Signed)
[facility] Patient [facility] [address] [place], [place]  [address] Phone:  [phone]   Fax:  [phone] Subjective:   Patient ID: Denise Moses, female    DOB: [birth year], 41 y.o.   MRN: [id number]  Chief Complaint  Patient presents with   Follow-up    Patient is here today for vaginal itching x 10 days. Patient states that she was using monistat cream to see if that would help but it didn't.     HPI Denise Moses 41 y.o. female  has a past medical history of Abnormal MRI, cervical spine, Anemia, Elective abortion, Heart murmur, Herpes, Infection, Pregnancy induced hypertension, and Vitamin D deficiency. To the [facility] for vaginal itching.   Patient states that she has had vaginal itching x 10 days. Denies any known discharge, has been using OTC monistat for 8 days with no improvement. Has had one partner unprotected in the past 6 mths, heterosexual preference. Last intercourse 14-15 days ago. Has had similar symptoms in the past and was treated, but diagnosis unknown. Denies any burning with urination. wDenies any other concerns today.   Denies any fatigue, chest pain, shortness of breath, HA or dizziness. Denies any blurred vision, numbness or tingling.   Past Medical History:  Diagnosis Date   Abnormal MRI, cervical spine    Anemia    Elective abortion    Heart murmur    Herpes    Infection    UTI   Pregnancy induced hypertension    2001   Vitamin D deficiency     Past Surgical History:  Procedure Laterality Date   CESAREAN SECTION N/A 03/23/2020   Procedure: CESAREAN SECTION;  Surgeon: Tilda Burrow, MD;  Location: [facility] LD ORS;  Service: Obstetrics;  Laterality: N/A;  Arrest of Dilation   CHOLECYSTECTOMY     LAPAROSCOPY N/A 06/09/2016   Procedure: LAPAROSCOPY OPERATIVE;  Surgeon: Tereso Newcomer, MD;  Location: [facility] ORS;  Service: Gynecology;  Laterality: N/A;  ectopic    THERAPEUTIC ABORTION     x 4   TUBAL LIGATION     UNILATERAL  SALPINGECTOMY Left 06/09/2016   Procedure: UNILATERAL SALPINGECTOMY, REMOVAL OF IUD;  Surgeon: Tereso Newcomer, MD;  Location: [facility] ORS;  Service: Gynecology;  Laterality: Left;   WISDOM TOOTH EXTRACTION      Family History  Problem Relation Age of Onset   Breast cancer Mother 59       was a smoker, alcohol drinker in early life    Breast cancer Maternal Aunt    Cancer Maternal Grandfather        unsure of type   Other Father        unsure of history   Breast cancer Maternal Aunt     Social History   Socioeconomic History   Marital status: Single    Spouse name: Not on file   Number of children: 6   Years of education: some college   Highest education level: Not on file  Occupational History   Occupation: Unemployed  Tobacco Use   Smoking status: Former    Packs/day: 0.25    Years: 10.00    Pack years: 2.50    Types: Cigarettes    Quit date: 03/2019    Years since quitting: 2.8   Smokeless tobacco: Never   Tobacco comments:    2-3 cigs/day  Vaping Use   Vaping Use: Never used  Substance and Sexual Activity   Alcohol use: Not Currently  Comment: social drinker   Drug use: Yes    Frequency: 5.0 times per week    Types: Marijuana    Comment: last used before delivery   Sexual activity: Not Currently    Partners: Male    Birth control/protection: Surgical  Other Topics Concern   Not on file  Social History Narrative   Lives at home with children.   Right-handed.   No daily caffeine use.   Social Determinants of Health   Financial Resource Strain: Not on file  Food Insecurity: Not on file  Transportation Needs: Not on file  Physical Activity: Not on file  Stress: Not on file  Social Connections: Not on file  Intimate Partner Violence: Not on file    Outpatient Medications Prior to Visit  Medication Sig Dispense Refill   Ferrous Sulfate (IRON PO) Take by mouth. (Patient not taking: Reported on 04/28/2021)     gabapentin (NEURONTIN) 300 MG capsule Take 1  capsule (300 mg total) by mouth 3 (three) times daily. (Patient not taking: Reported on 04/28/2021) 90 capsule 11   Multiple Vitamin (MULTIVITAMIN ADULT PO) Take by mouth. (Patient not taking: Reported on 04/28/2021)     NIFEdipine (PROCARDIA XL/NIFEDICAL-XL) 90 MG 24 hr tablet Take 1 tablet (90 mg total) by mouth daily. (Patient not taking: Reported on 04/28/2021) 90 tablet 3   TECFIDERA 240 MG CPDR Take 240 mg by mouth 2 (two) times daily. (Patient not taking: Reported on 04/28/2021)     Vitamin D, Ergocalciferol, (DRISDOL) 1.25 MG (50000 UNIT) CAPS capsule Take 1 capsule (50,000 Units total) by mouth every 7 (seven) days. (Patient not taking: Reported on 04/28/2021) 5 capsule 0   Zoster Vaccine Adjuvanted [facility]) injection Please complete the shingrix according to protocol prior to Ocrelizumab infusion (Patient not taking: Reported on 04/28/2021) 0.5 mL 0   No facility-administered medications prior to visit.    No Known Allergies  Review of Systems  Constitutional: Negative.  Negative for chills, fever and malaise/fatigue.  Respiratory:  Negative for cough and shortness of breath.   Cardiovascular:  Negative for chest pain, palpitations and leg swelling.  Gastrointestinal:  Negative for abdominal pain, blood in stool, constipation, diarrhea, nausea and vomiting.  Genitourinary:        See HPI  Skin: Negative.   Neurological: Negative.   Psychiatric/Behavioral:  Negative for depression. The patient is not nervous/anxious.   All other systems reviewed and are negative.     Objective:    Physical Exam Constitutional:      General: She is not in acute distress.    Appearance: Normal appearance. She is obese.  HENT:     Head: Normocephalic.  Cardiovascular:     Rate and Rhythm: Normal rate and regular rhythm.     Pulses: Normal pulses.     Heart sounds: Normal heart sounds.     Comments: No obvious peripheral edema Pulmonary:     Effort: Pulmonary effort is normal.     Breath  sounds: Normal breath sounds.  Abdominal:     General: Abdomen is flat. Bowel sounds are normal. There is no distension.     Palpations: Abdomen is soft. There is no mass.     Tenderness: There is no abdominal tenderness. There is no right CVA tenderness, left CVA tenderness, guarding or rebound.     Hernia: No hernia is present.  Genitourinary:    Comments: Exam deferred. Self swab completed  Skin:    General: Skin is warm and dry.  Capillary Refill: Capillary refill takes less than 2 seconds.  Neurological:     General: No focal deficit present.     Mental Status: She is alert and oriented to person, place, and time.  Psychiatric:        Mood and Affect: Mood normal.        Behavior: Behavior normal.        Thought Content: Thought content normal.        Judgment: Judgment normal.    BP 108/60   Pulse 79   Temp 97.6 F (36.4 C)   Ht $R'5\' 6"'St$  (1.676 m)   Wt 200 lb 6.4 oz (90.9 kg)   SpO2 99%   BMI 32.35 kg/m  Wt Readings from Last 3 Encounters:  12/24/21 200 lb 6.4 oz (90.9 kg)  06/16/21 214 lb (97.1 kg)  04/28/21 222 lb (100.7 kg)    Immunization History  Administered Date(s) Administered   Tdap 03/02/2020    Diabetic Foot Exam - Simple   No data filed     Lab Results  Component Value Date   TSH 0.946 09/11/2020   Lab Results  Component Value Date   WBC 6.4 04/28/2021   HGB 10.0 (L) 04/28/2021   HCT 31.9 (L) 04/28/2021   MCV 79 04/28/2021   PLT 300 04/28/2021   Lab Results  Component Value Date   NA 139 04/28/2021   K 3.9 04/28/2021   CO2 22 04/28/2021   GLUCOSE 80 04/28/2021   BUN 8 04/28/2021   CREATININE 0.86 04/28/2021   BILITOT <0.2 04/28/2021   ALKPHOS 80 04/28/2021   AST 13 04/28/2021   ALT 9 04/28/2021   PROT 7.4 04/28/2021   ALBUMIN 4.6 04/28/2021   CALCIUM 9.2 04/28/2021   ANIONGAP 8 03/24/2020   EGFR 87 04/28/2021   Lab Results  Component Value Date   CHOL 140 04/28/2021   CHOL 146 06/30/2017   Lab Results  Component Value  Date   HDL 54 04/28/2021   HDL 54 06/30/2017   Lab Results  Component Value Date   LDLCALC 65 04/28/2021   LDLCALC 70 06/30/2017   Lab Results  Component Value Date   TRIG 119 04/28/2021   TRIG 110 06/30/2017   Lab Results  Component Value Date   CHOLHDL 2.6 04/28/2021   CHOLHDL 2.7 06/30/2017   Lab Results  Component Value Date   HGBA1C 5.3 04/28/2021   HGBA1C 5.2 09/17/2019   HGBA1C 5.5 04/12/2019       Assessment & Plan:   Problem List Items Addressed This Visit   None Visit Diagnoses     Vaginal itching    -  Primary   Relevant Medications   metroNIDAZOLE (FLAGYL) 500 MG tablet, initiated during visit   Other Relevant Orders   POCT URINALYSIS DIP (CLINITEK)   NuSwab Vaginitis Plus (VG+) (Completed) Discussed possible causes  Discussed non pharmacological methods for management of symptoms Informed to take OTC medications as needed    Follow up in 1-2 wks if symptoms worsen or do not improve, sooner as needed     I am having Delancey C. Tung start on metroNIDAZOLE. I am also having her maintain her NIFEdipine, Shingrix, gabapentin, Vitamin D (Ergocalciferol), Ferrous Sulfate (IRON PO), Multiple Vitamin (MULTIVITAMIN ADULT PO), and Tecfidera.  Meds ordered this encounter  Medications   metroNIDAZOLE (FLAGYL) 500 MG tablet    Sig: Take 1 tablet (500 mg total) by mouth 2 (two) times daily for 7 days.    Dispense:  14 tablet    Refill:  0     Teena Dunk, NP

## 2021-12-28 LAB — NUSWAB VAGINITIS PLUS (VG+)
Candida albicans, NAA: NEGATIVE
Candida glabrata, NAA: NEGATIVE
Chlamydia trachomatis, NAA: NEGATIVE
Megasphaera 1: HIGH Score — AB
Neisseria gonorrhoeae, NAA: NEGATIVE
Trich vag by NAA: NEGATIVE

## 2022-03-04 IMAGING — US US MFM FETAL BPP W/O NON-STRESS
1 series · 13 of 28 positions shown · non-contrast
Comparison: none

[Series 1: us mfm fetal bpp w/o non-stress · 35 acquisitions, 13 frames shown]
[im 2/35]
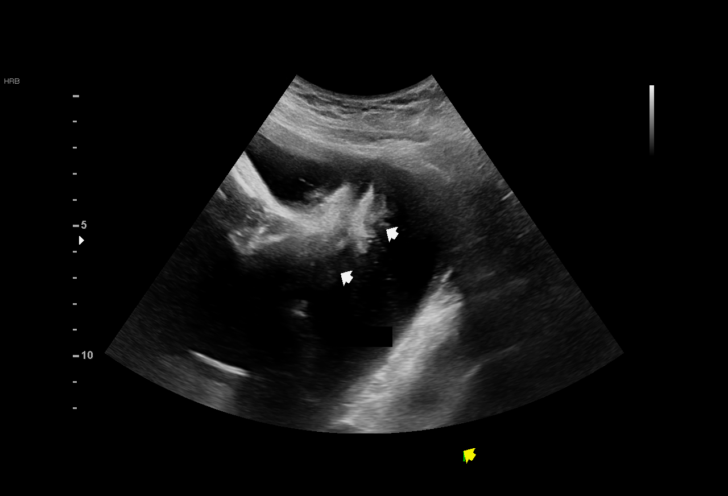
[im 4/35]
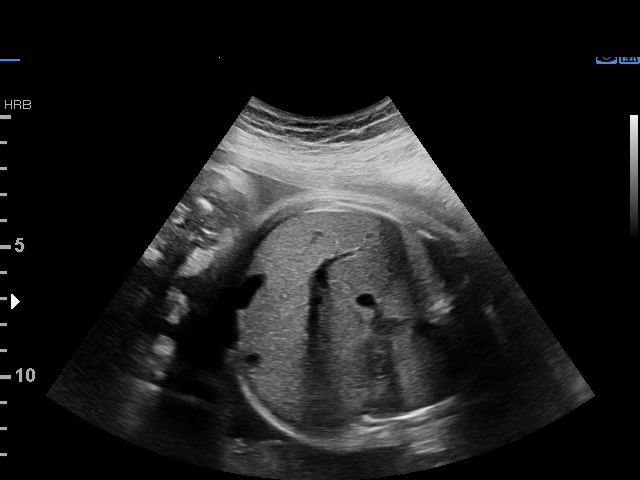
[im 7/35]
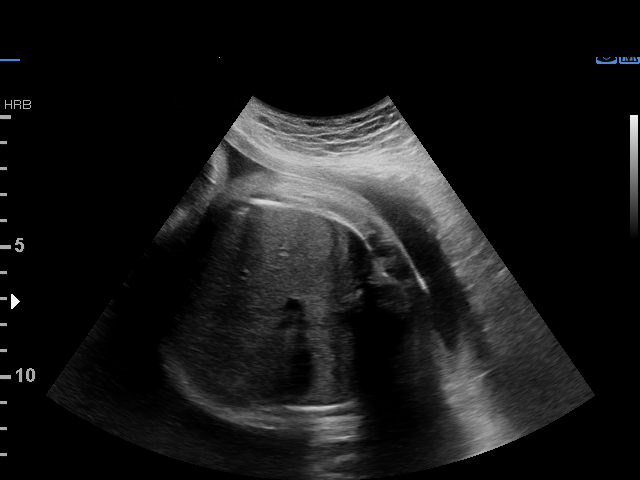
[im 9/35]
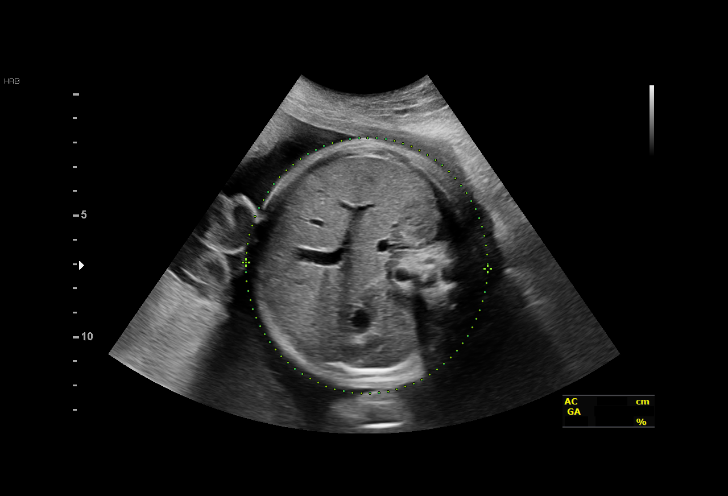
[im 12/35]
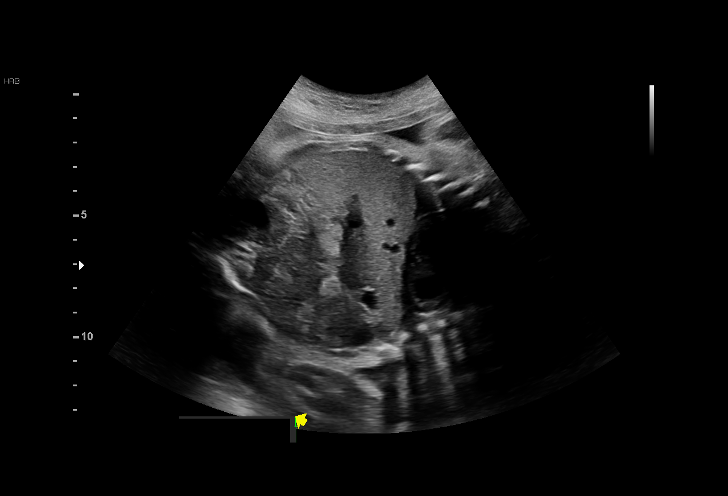
[im 14/35]
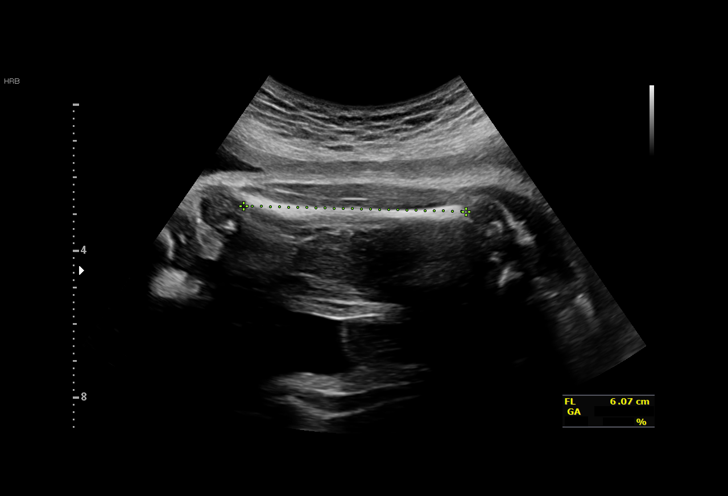
[im 18/35]
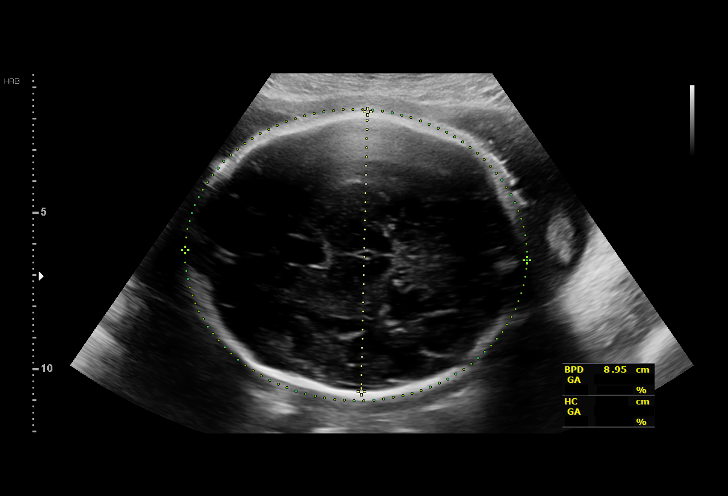
[im 21/35]
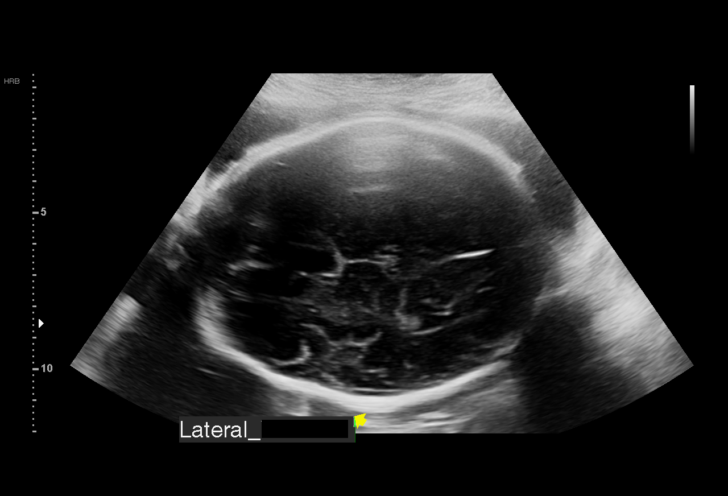
[im 23/35]
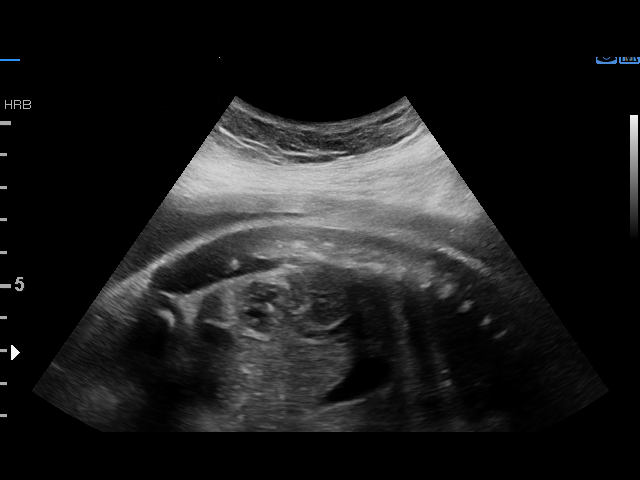
[im 26/35]
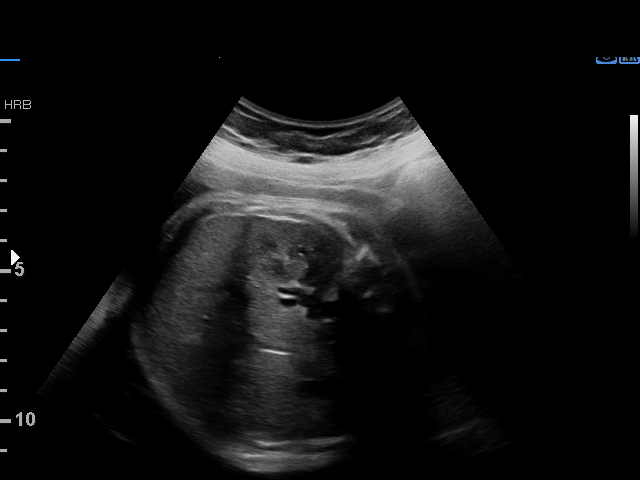
[im 28/35]
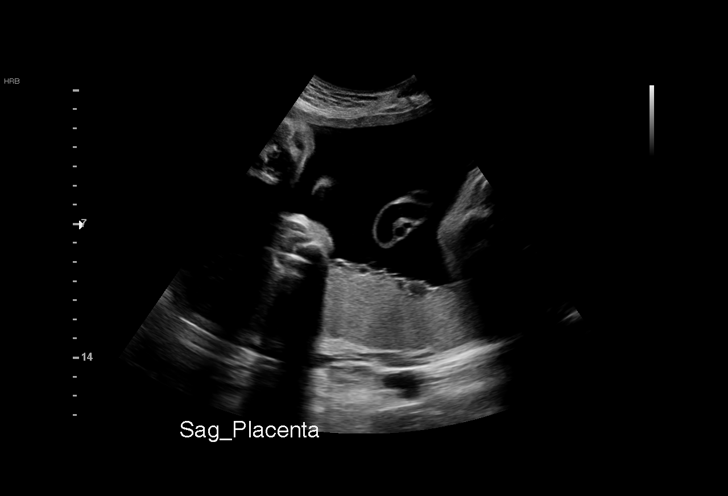
[im 31/35]
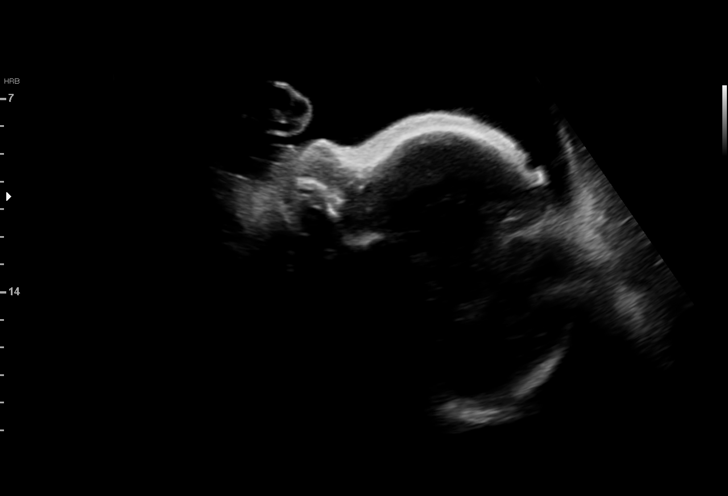
[im 33/35]
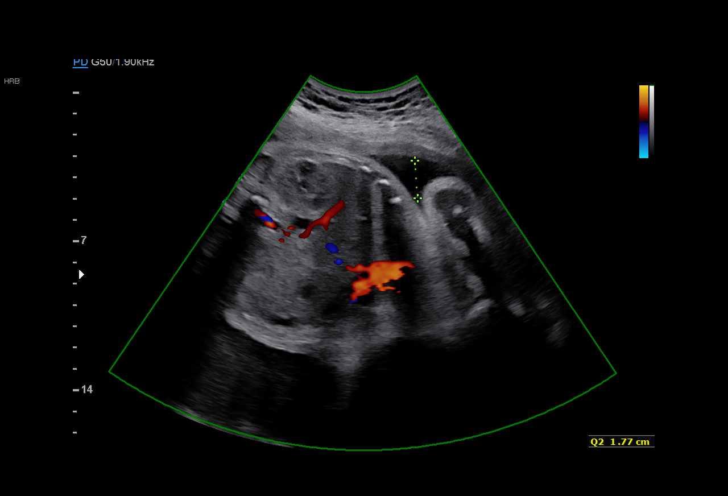

[13 of 28 positions shown; findings below may reference images not displayed]

KLPIGBB

Indications

 Hypertension - Chronic/Pre-existing
 Advanced maternal age multigravida 35+,
 second trimester
 Encounter for other antenatal screening
 follow-up
 Obesity complicating pregnancy, second
 trimester
 Multiple sclerosis                             IMM.LCW G35
 Low risk NIPS,
 Genetic carrier (silent [place])
 31 weeks gestation of pregnancy
Vital Signs

                                                Height:        5'6"
Fetal Evaluation

 Num Of Fetuses:         1
 Fetal Heart Rate(bpm):  142
 Cardiac Activity:       Observed
 Presentation:           Cephalic
 Placenta:               Posterior
 P. Cord Insertion:      Previously Visualized
 Amniotic Fluid
 AFI FV:      Within normal limits

 AFI Sum(cm)     %Tile       Largest Pocket(cm)
 19.3            73

 RUQ(cm)       RLQ(cm)       LUQ(cm)        LLQ(cm)

Biophysical Evaluation

 Amniotic F.V:   Within normal limits       F. Tone:        Observed
 F. Movement:    Observed                   Score:          [DATE]
 F. Breathing:   Observed
Biometry

 BPD:      89.2  mm     G. Age:  36w 1d       > 99  %    CI:        77.86   %    70 - 86
                                                         FL/HC:      18.9   %    19.3 -
 HC:      319.9  mm     G. Age:  36w 0d       > 99  %    HC/AC:      0.99        0.96 -
 AC:      321.9  mm     G. Age:  36w 1d       > 99  %    FL/BPD:     67.9   %    71 - 87
 FL:       60.6  mm     G. Age:  31w 4d         38  %    FL/AC:      18.8   %    20 - 24

 LV:        3.8  mm

 Est. FW:    1781  gm    5 lb 10 oz    > 99  %
OB History

 Gravidity:    12        Term:   4        Prem:   1        SAB:   2
 TOP:          3       Ectopic:  1        Living: 5
Gestational Age

 LMP:           31w 3d        Date:  07/03/19                 EDD:   04/08/20
 U/S Today:     35w 0d                                        EDD:   03/14/20
 Best:          31w 3d     Det. By:  LMP  (07/03/19)          EDD:   04/08/20
Anatomy

 Cranium:               Appears normal         Aortic Arch:            Previously seen
 Cavum:                 Appears normal         Ductal Arch:            Previously seen
 Ventricles:            Appears normal         Diaphragm:              Appears normal
 Choroid Plexus:        Previously seen        Stomach:                Appears normal, left
                                                                       sided
 Cerebellum:            Previously seen        Abdomen:                Appears normal
 Posterior Fossa:       Previously seen        Abdominal Wall:         Previously seen
 Nuchal Fold:           Previously seen        Cord Vessels:           Previously seen
 Face:                  Orbits and profile     Kidneys:                Appear normal
                        previously seen
 Lips:                  Previously seen        Bladder:                Appears normal
 Thoracic:              Appears normal         Spine:                  Previously seen
 Heart:                 Previously seen        Upper Extremities:      Previously seen
 RVOT:                  Previously seen        Lower Extremities:      Previously seen
 LVOT:                  Previously seen

 Other:  5th digit  prev visualized. Nasal bone prev visualized.
Cervix Uterus Adnexa
 Cervix
 Not visualized (advanced GA >02wks)
Comments

 This patient was seen for a follow up growth scan and
 biophysical profile due to chronic hypertension currently
 treated with labetalol 200 mg twice a day.  The patient reports
 that she has screened negative for gestational diabetes in
 her current pregnancy.
 She was informed that the fetal growth continues to measure
 greater than the 99th percentile for her gestational age.
 There was normal amniotic fluid noted today.
 The patient reports 5 prior uncomplicated vaginal deliveries.
 She reports that her last child weighed 9 pounds 9 ounces at
 term.  She was advised that based on the fetal growth noted
 today, this baby will most likely be of similar weight as her
 last child.
 A biophysical profile performed today was [DATE].
 Due to chronic hypertension, we will continue weekly fetal
 testing until delivery.
 Another biophysical profile scheduled in 1 week.

## 2022-04-23 ENCOUNTER — Encounter (HOSPITAL_COMMUNITY): Payer: Self-pay

## 2022-04-23 ENCOUNTER — Ambulatory Visit (HOSPITAL_COMMUNITY)
Admission: EM | Admit: 2022-04-23 | Discharge: 2022-04-23 | Disposition: A | Discharge status: Home / Self Care | Payer: Medicaid Other | Attending: Emergency Medicine | Admitting: Emergency Medicine

## 2022-04-23 DIAGNOSIS — N3090 Cystitis, unspecified without hematuria: Secondary | ICD-10-CM | POA: Diagnosis not present

## 2022-04-23 DIAGNOSIS — R350 Frequency of micturition: Secondary | ICD-10-CM | POA: Insufficient documentation

## 2022-04-23 DIAGNOSIS — Z202 Contact with and (suspected) exposure to infections with a predominantly sexual mode of transmission: Secondary | ICD-10-CM

## 2022-04-23 DIAGNOSIS — N390 Urinary tract infection, site not specified: Secondary | ICD-10-CM | POA: Diagnosis not present

## 2022-04-23 LAB — POCT URINALYSIS DIPSTICK, ED / UC
Bilirubin Urine: NEGATIVE
Glucose, UA: NEGATIVE mg/dL
Hgb urine dipstick: NEGATIVE
Ketones, ur: NEGATIVE mg/dL
Nitrite: NEGATIVE
Protein, ur: NEGATIVE mg/dL
Specific Gravity, Urine: 1.02 (ref 1.005–1.030)
Urobilinogen, UA: 0.2 mg/dL (ref 0.0–1.0)
pH: 7 (ref 5.0–8.0)

## 2022-04-23 MED ORDER — AZITHROMYCIN 250 MG PO TABS
1000.0000 mg | ORAL_TABLET | Freq: Once | ORAL | Status: AC
  Administered 2022-04-23: 1000 mg via ORAL

## 2022-04-23 MED ORDER — METRONIDAZOLE 500 MG PO TABS
2000.0000 mg | ORAL_TABLET | Freq: Once | ORAL | Status: AC
  Administered 2022-04-23: 2000 mg via ORAL

## 2022-04-23 MED ORDER — AZITHROMYCIN 250 MG PO TABS
ORAL_TABLET | ORAL | Status: AC
  Filled 2022-04-23: qty 4

## 2022-04-23 MED ORDER — CEPHALEXIN 500 MG PO CAPS: 500.0000 mg | ORAL_CAPSULE | Freq: Four times a day (QID) | ORAL | 0 refills | Status: DC

## 2022-04-23 MED ORDER — METRONIDAZOLE 500 MG PO TABS
ORAL_TABLET | ORAL | Status: AC
  Filled 2022-04-23: qty 4

## 2022-04-23 MED ORDER — FLUCONAZOLE 200 MG PO TABS: 200.0000 mg | ORAL_TABLET | Freq: Every day | ORAL | 0 refills | Status: AC

## 2022-04-23 NOTE — ED Triage Notes (Signed)
Pt states burning with urination,lower abdominal pain and vaginal discharge for the past 2 days.

## 2022-04-23 NOTE — Discharge Instructions (Addendum)
Discussed with patient we will send off lab test she is to check her MyChart for results in approximately 3 days.  If any test results returned positive we will call with treatment Urine showed a slight infection also we will send antibiotics for this Stay hydrated drink plenty of fluids

## 2022-04-23 NOTE — ED Provider Notes (Signed)
[facility]    CSN: [id number] Arrival date & time: 04/23/22  0847      History   Chief Complaint Chief Complaint  Patient presents with   Dysuria    HPI Denise Moses is a 41 y.o. female.   Patient presents today with urinary frequency vaginal discharge clear at this time lower suprapubic pain x2 days.  Patient states that she had similar symptoms a few months back when her partner tested positive for trichomonas.  Patient was treated then and states that she has very similar symptoms.  Denies any nausea vomiting no chest pain denies any fevers.  Taking over-the-counter urinary medications and drinking cranberry with no relief.    Past Medical History:  Diagnosis Date   Abnormal MRI, cervical spine    Anemia    Elective abortion    Heart murmur    Herpes    Infection    UTI   Pregnancy induced hypertension    2001   Vitamin D deficiency     Patient Active Problem List   Diagnosis Date Noted   Neuropathic pain 09/11/2020   Cesarean delivery, delivered, current hospitalization 03/23/2020   Alpha thalassemia silent carrier 12/18/2019   AMA (advanced maternal age) multigravida 35+ 09/17/2019   Maternal obesity affecting pregnancy, antepartum 09/17/2019   Chronic hypertension affecting pregnancy 09/17/2019   Supervision of other normal pregnancy, antepartum 09/10/2019   GBS bacteriuria 08/19/2019   Multiple sclerosis ([place]) 04/30/2019   Cervical myelopathy ([place]) 04/12/2019   Paresthesia 04/12/2019   Vitamin D deficiency 07/05/2017   Iron deficiency anemia 07/05/2017   Herpes simplex antibody positive 04/15/2016   Recurrent boils 04/13/2016   Family history of breast cancer in first degree relative 04/13/2016   Smoker 07/03/2014    Past Surgical History:  Procedure Laterality Date   CESAREAN SECTION N/A 03/23/2020   Procedure: CESAREAN SECTION;  Surgeon: Jonnie Kind, MD;  Location: [facility];  Service: Obstetrics;  Laterality: N/A;  Arrest  of Dilation   CHOLECYSTECTOMY     LAPAROSCOPY N/A 06/09/2016   Procedure: LAPAROSCOPY OPERATIVE;  Surgeon: Osborne Oman, MD;  Location: [facility]S;  Service: Gynecology;  Laterality: N/A;  ectopic    THERAPEUTIC ABORTION     x 4   TUBAL LIGATION     UNILATERAL SALPINGECTOMY Left 06/09/2016   Procedure: UNILATERAL SALPINGECTOMY, REMOVAL OF IUD;  Surgeon: Osborne Oman, MD;  Location: [facility];  Service: Gynecology;  Laterality: Left;   WISDOM TOOTH EXTRACTION      OB History     Gravida  12   Para  6   Term  5   Preterm  1   AB  6   Living  6      SAB  2   IAB  3   Ectopic  1   Multiple  0   Live Births  6            Home Medications    Prior to Admission medications   Medication Sig Start Date End Date Taking? Authorizing Provider  cephALEXin (KEFLEX) 500 MG capsule Take 1 capsule (500 mg total) by mouth 4 (four) times daily. 04/23/22  Yes Marney Setting, NP  fluconazole (DIFLUCAN) 200 MG tablet Take 1 tablet (200 mg total) by mouth daily for 7 days. 04/23/22 04/30/22 Yes Marney Setting, NP  Ferrous Sulfate (IRON PO) Take by mouth. Patient not taking: Reported on 04/28/2021    [provider]  gabapentin (  NEURONTIN) 300 MG capsule Take 1 capsule (300 mg total) by mouth 3 (three) times daily. Patient not taking: Reported on 04/28/2021 09/11/20   [name], MD  Multiple Vitamin (MULTIVITAMIN ADULT PO) Take by mouth. Patient not taking: Reported on 04/28/2021    [provider]  NIFEdipine (PROCARDIA XL/NIFEDICAL-XL) 90 MG 24 hr tablet Take 1 tablet (90 mg total) by mouth daily. Patient not taking: Reported on 04/28/2021 05/08/20 05/08/21  Vevelyn Francois, NP  TECFIDERA 240 MG CPDR Take 240 mg by mouth 2 (two) times daily. Patient not taking: Reported on 04/28/2021    [provider]  Vitamin D, Ergocalciferol, (DRISDOL) 1.25 MG (50000 UNIT) CAPS capsule Take 1 capsule (50,000 Units total) by mouth every 7 (seven) days. Patient not  taking: Reported on 04/28/2021 09/15/20   [name], MD  Zoster Vaccine Adjuvanted [facility]) injection Please complete the shingrix according to protocol prior to Ocrelizumab infusion Patient not taking: Reported on 04/28/2021 09/11/20   [name], MD    Family History Family History  Problem Relation Age of Onset   Breast cancer Mother 46       was a smoker, alcohol drinker in early life    Breast cancer Maternal Aunt    Cancer Maternal Grandfather        unsure of type   Other Father        unsure of history   Breast cancer Maternal Aunt     Social History Social History   Tobacco Use   Smoking status: Former    Packs/day: 0.25    Years: 10.00    Total pack years: 2.50    Types: Cigarettes    Quit date: 03/2019    Years since quitting: 3.1   Smokeless tobacco: Never   Tobacco comments:    2-3 cigs/day  Vaping Use   Vaping Use: Never used  Substance Use Topics   Alcohol use: Not Currently    Comment: social drinker   Drug use: Yes    Frequency: 5.0 times per week    Types: Marijuana    Comment: last used before delivery     Allergies   Patient has no known allergies.   Review of Systems Review of Systems  Constitutional:  Negative for fatigue and fever.  Respiratory: Negative.    Cardiovascular: Negative.   Gastrointestinal:  Positive for abdominal pain. Negative for diarrhea, nausea and vomiting.  Genitourinary:  Positive for pelvic pain, urgency and vaginal discharge.       External vaginal itching     Physical Exam Triage Vital Signs ED Triage Vitals [04/23/22 0956]  Enc Vitals Group     BP (!) 145/83     Pulse Rate 75     Resp 16     Temp 98.3 F (36.8 C)     Temp Source Oral     SpO2 99 %     Weight      Height      Head Circumference      Peak Flow      Pain Score 8     Pain Loc      Pain Edu?      Excl. in [place]?    No data found.  Updated Vital Signs BP (!) 145/83 (BP Location: Left Arm)   Pulse 75   Temp 98.3 F (36.8 C)  (Oral)   Resp 16   LMP 04/06/2022 (Exact Date)   SpO2 99%   Visual Acuity Right Eye Distance:  Left Eye Distance:   Bilateral Distance:    Right Eye Near:   Left Eye Near:    Bilateral Near:     Physical Exam Constitutional:      Appearance: Normal appearance.  Cardiovascular:     Rate and Rhythm: Normal rate.  Pulmonary:     Effort: Pulmonary effort is normal.  Abdominal:     General: Abdomen is flat.  Musculoskeletal:        General: Normal range of motion.  Skin:    General: Skin is warm.  Neurological:     Mental Status: She is alert.      UC Treatments / Results  Labs (all labs ordered are listed, but only abnormal results are displayed) Labs Reviewed  POCT URINALYSIS DIPSTICK, ED / UC - Abnormal; Notable for the following components:      Result Value   Leukocytes,Ua SMALL (*)    All other components within normal limits  URINE CULTURE  URINALYSIS, ROUTINE W REFLEX MICROSCOPIC  CERVICOVAGINAL ANCILLARY ONLY    EKG   Radiology No results found.  Procedures Procedures (including critical care time)  Medications Ordered in UC Medications  metroNIDAZOLE (FLAGYL) tablet 2,000 mg (2,000 mg Oral Given 04/23/22 1028)  azithromycin (ZITHROMAX) tablet 1,000 mg (1,000 mg Oral Given 04/23/22 1028)    Initial Impression / Assessment and Plan / UC Course  I have reviewed the triage vital signs and the nursing notes.  Pertinent labs & imaging results that were available during my care of the patient were reviewed by me and considered in my medical decision making (see chart for details).     Patient would like to be treated for trichomonas today and for a yeast infection that she believes that she has.  Discussed with patient we will send off lab test she is to check her MyChart for results in approximately 3 days.  If any test results returned positive we will call with treatment  Final Clinical Impressions(s) / UC Diagnoses   Final diagnoses:  Cystitis   Urinary frequency  Possible exposure to STD  Lower urinary tract infectious disease     Discharge Instructions       Discussed with patient we will send off lab test she is to check her MyChart for results in approximately 3 days.  If any test results returned positive we will call with treatment Urine showed a slight infection also we will send antibiotics for this Stay hydrated drink plenty of fluids     ED Prescriptions     Medication Sig Dispense Auth. Provider   fluconazole (DIFLUCAN) 200 MG tablet Take 1 tablet (200 mg total) by mouth daily for 7 days. 7 tablet Morley Kos L, NP   cephALEXin (KEFLEX) 500 MG capsule Take 1 capsule (500 mg total) by mouth 4 (four) times daily. 20 capsule Marney Setting, NP      PDMP not reviewed this encounter.   Marney Setting, NP 04/23/22 1029

## 2022-04-25 LAB — CERVICOVAGINAL ANCILLARY ONLY
Bacterial Vaginitis (gardnerella): NEGATIVE
Candida Glabrata: NEGATIVE
Candida Vaginitis: POSITIVE — AB
Chlamydia: NEGATIVE
Comment: NEGATIVE
Comment: NEGATIVE
Comment: NEGATIVE
Comment: NEGATIVE
Comment: NEGATIVE
Comment: NORMAL
Neisseria Gonorrhea: NEGATIVE
Trichomonas: NEGATIVE

## 2022-04-25 LAB — URINE CULTURE: Culture: >=100000 — AB

## 2022-07-01 ENCOUNTER — Ambulatory Visit: Payer: Self-pay | Admitting: Nurse Practitioner

## 2022-07-22 ENCOUNTER — Ambulatory Visit: Payer: Self-pay | Admitting: Nurse Practitioner

## 2022-08-20 ENCOUNTER — Emergency Department (HOSPITAL_COMMUNITY)
Admission: EM | Admit: 2022-08-20 | Discharge: 2022-08-20 | Disposition: A | Discharge status: Home / Self Care | Payer: Medicaid Other | Attending: Emergency Medicine | Admitting: Emergency Medicine

## 2022-08-20 ENCOUNTER — Other Ambulatory Visit: Payer: Self-pay

## 2022-08-20 ENCOUNTER — Encounter (HOSPITAL_COMMUNITY): Payer: Self-pay

## 2022-08-20 DIAGNOSIS — L02412 Cutaneous abscess of left axilla: Secondary | ICD-10-CM | POA: Insufficient documentation

## 2022-08-20 DIAGNOSIS — L02414 Cutaneous abscess of left upper limb: Secondary | ICD-10-CM | POA: Diagnosis not present

## 2022-08-20 DIAGNOSIS — L0291 Cutaneous abscess, unspecified: Secondary | ICD-10-CM

## 2022-08-20 MED ORDER — DOXYCYCLINE HYCLATE 100 MG PO CAPS: 100.0000 mg | ORAL_CAPSULE | Freq: Two times a day (BID) | ORAL | 0 refills | Status: DC

## 2022-08-20 MED ORDER — LIDOCAINE-EPINEPHRINE-TETRACAINE (LET) TOPICAL GEL
3.0000 mL | Freq: Once | TOPICAL | Status: AC
  Administered 2022-08-20: 3 mL via TOPICAL
  Filled 2022-08-20: qty 3

## 2022-08-20 MED ORDER — LIDOCAINE HCL (PF) 1 % IJ SOLN
5.0000 mL | Freq: Once | INTRAMUSCULAR | Status: AC
  Administered 2022-08-20: 5 mL
  Filled 2022-08-20: qty 5

## 2022-08-20 MED ORDER — DOXYCYCLINE HYCLATE 100 MG PO CAPS: 100.0000 mg | ORAL_CAPSULE | Freq: Two times a day (BID) | ORAL | 0 refills | Status: AC

## 2022-08-20 MED ORDER — FENTANYL CITRATE PF 50 MCG/ML IJ SOSY
50.0000 ug | PREFILLED_SYRINGE | Freq: Once | INTRAMUSCULAR | Status: AC
  Administered 2022-08-20: 50 ug via INTRAMUSCULAR
  Filled 2022-08-20: qty 1

## 2022-08-20 NOTE — ED Triage Notes (Signed)
Pt arrived complaining of abscess under left arm that she noticed about 1wk ago, pt states in the past she has had to have a similar abscess drained.   Pt denies having fever/chills and states that she has not noticed any drainage but there is a scab on it.

## 2022-08-20 NOTE — ED Provider Notes (Addendum)
[facility] EMERGENCY DEPARTMENT Provider Note   CSN: [id number] Arrival date & time: 08/20/22  0335     History  Chief Complaint  Patient presents with   Abscess    Denise Moses is a 42 y.o. female.  HPI   Without significant medical history presents with complaints of an abscess.  Patient states she noticed abscess underneath her left armpit about 1 week ago, states has gotten larger and more painful, there is been no drainage or discharge, states that she is applying warm compress to the area and for she will come to the head.  Typically they will drain on their own.  She has a past had to have them drained.  She is not immunocompromise, she denies any fever chills cough congestion she has no other complaints.  Home Medications Prior to Admission medications   Medication Sig Start Date End Date Taking? Authorizing Provider  cephALEXin (KEFLEX) 500 MG capsule Take 1 capsule (500 mg total) by mouth 4 (four) times daily. 04/23/22   Marney Setting, NP  doxycycline (VIBRAMYCIN) 100 MG capsule Take 1 capsule (100 mg total) by mouth 2 (two) times daily for 7 days. 08/20/22 08/27/22  Marcello Fennel, PA-C  Ferrous Sulfate (IRON PO) Take by mouth. Patient not taking: Reported on 04/28/2021    [provider]  gabapentin (NEURONTIN) 300 MG capsule Take 1 capsule (300 mg total) by mouth 3 (three) times daily. Patient not taking: Reported on 04/28/2021 09/11/20   [name], MD  Multiple Vitamin (MULTIVITAMIN ADULT PO) Take by mouth. Patient not taking: Reported on 04/28/2021    [provider]  NIFEdipine (PROCARDIA XL/NIFEDICAL-XL) 90 MG 24 hr tablet Take 1 tablet (90 mg total) by mouth daily. Patient not taking: Reported on 04/28/2021 05/08/20 05/08/21  Vevelyn Francois, NP  TECFIDERA 240 MG CPDR Take 240 mg by mouth 2 (two) times daily. Patient not taking: Reported on 04/28/2021    [provider]  Vitamin D, Ergocalciferol, (DRISDOL)  1.25 MG (50000 UNIT) CAPS capsule Take 1 capsule (50,000 Units total) by mouth every 7 (seven) days. Patient not taking: Reported on 04/28/2021 09/15/20   [name], MD  Zoster Vaccine Adjuvanted [facility]) injection Please complete the shingrix according to protocol prior to Ocrelizumab infusion Patient not taking: Reported on 04/28/2021 09/11/20   [name], MD      Allergies    Patient has no known allergies.    Review of Systems   Review of Systems  Constitutional:  Negative for chills and fever.  Respiratory:  Negative for shortness of breath.   Cardiovascular:  Negative for chest pain.  Gastrointestinal:  Negative for abdominal pain.  Skin:  Positive for wound.  Neurological:  Negative for headaches.    Physical Exam Updated Vital Signs BP (!) 157/90 (BP Location: Right Arm)   Pulse 74   Temp 98.6 F (37 C) (Oral)   Resp 20   Ht '5\' 6"'$  (1.676 m)   Wt 86.2 kg   SpO2 100%   BMI 30.67 kg/m  Physical Exam Vitals and nursing note reviewed.  Constitutional:      General: She is not in acute distress.    Appearance: Normal appearance. She is not ill-appearing or diaphoretic.  HENT:     Head: Normocephalic and atraumatic.     Nose: No congestion or rhinorrhea.  Eyes:     Conjunctiva/sclera: Conjunctivae normal.  Cardiovascular:     Rate and Rhythm: Normal rate and regular rhythm.  Pulmonary:     Effort: Pulmonary effort is normal.     Breath sounds: Normal breath sounds.  Musculoskeletal:     Cervical back: Neck supple.  Skin:    General: Skin is warm and dry.     Comments: Patient is a noted abscess underneath her left axilla, about the size of a golf ball, no overlying erythema no active drainage or discharge, fluctuance induration present.  Neurological:     Mental Status: She is alert and oriented to person, place, and time.  Psychiatric:        Mood and Affect: Mood normal.     ED Results / Procedures / Treatments   Labs (all labs ordered are listed, but  only abnormal results are displayed) Labs Reviewed - No data to display  EKG None  Radiology No results found.  Procedures .Marland KitchenIncision and Drainage  Date/Time: 08/20/2022 5:14 AM  Performed by: Marcello Fennel, PA-C Authorized by: Marcello Fennel, PA-C   Consent:    Consent obtained:  Verbal   Consent given by:  Patient   Risks discussed:  Bleeding, incomplete drainage, pain, damage to other organs and infection   Alternatives discussed:  No treatment, delayed treatment, alternative treatment, observation and referral Universal protocol:    Procedure explained and questions answered to patient or proxy's satisfaction: yes     Relevant documents present and verified: yes     Test results available : yes     Imaging studies available: yes     Required blood products, implants, devices, and special equipment available: yes     Site/side marked: yes     Immediately prior to procedure, a time out was called: yes     Patient identity confirmed:  Verbally with patient Location:    Type:  Abscess   Size:  3cm   Location:  Upper extremity   Upper extremity location:  Arm   Arm location:  L upper arm Pre-procedure details:    Skin preparation:  Betadine Sedation:    Sedation type:  None Anesthesia:    Anesthesia method:  Local infiltration   Local anesthetic:  Lidocaine 1% w/o epi Procedure type:    Complexity:  Complex Procedure details:    Ultrasound guidance: no     Needle aspiration: no     Incision types:  Single straight   Incision depth:  Subcutaneous   Wound management:  Probed and deloculated, irrigated with saline and extensive cleaning   Drainage:  Purulent   Drainage amount:  Moderate   Packing materials:  1/4 in gauze Post-procedure details:    Procedure completion:  Tolerated well, no immediate complications     Medications Ordered in ED Medications  lidocaine (PF) (XYLOCAINE) 1 % injection 5 mL (has no administration in time range)  fentaNYL  (SUBLIMAZE) injection 50 mcg (50 mcg Intramuscular Given 08/20/22 0439)  lidocaine-EPINEPHrine-tetracaine (LET) topical gel (3 mLs Topical Given 08/20/22 0444)    ED Course/ Medical Decision Making/ A&P                             Medical Decision Making Risk Prescription drug management.   This patient presents to the ED for concern of abscess, this involves an extensive number of treatment options, and is a complaint that carries with it a high risk of complications and morbidity.  The differential diagnosis includes necrotizing fasciitis,  enlarged lymph node, malignancy    Additional history obtained:  Additional history obtained from N/A External records from outside source obtained and reviewed including recent ER notes   Co morbidities that complicate the patient evaluation  N/A  Social Determinants of Health:  N/A    Lab Tests:  I Ordered, and personally interpreted labs.  The pertinent results include: N/A   Imaging Studies ordered:  I ordered imaging studies including N/A I independently visualized and interpreted imaging which showed N/A I agree with the radiologist interpretation   Cardiac Monitoring:  The patient was maintained on a cardiac monitor.  I personally viewed and interpreted the cardiac monitored which showed an underlying rhythm of: N/A   Medicines ordered and prescription drug management:  I ordered medication including lidocaine, fentanyl I have reviewed the patients home medicines and have made adjustments as needed  Critical Interventions:  N/A   Reevaluation:  Presents with an abscess, presentation is consistent with abscess, due to the size I recommend I&D as you do not feel medications will resolve this issue, went over risks and benefits with the patient, she is agreement with the procedure, she tolerated well.  She is agreement discharge at this time.   Consultations Obtained:  N/A    Test  Considered:  N/A    Rule out Suspicion for necrotizing fasciitis is low as presentation is atypical, there is no necrotic skin, patient is nontoxic-appearing will consistent benign abscess.  I doubt malignancy or enlarged lymph node is again presentation is atypical, there is fluctuance induration present, she has had history of this in the past there is purulent discharge upon I&D.    Dispostion and problem list  After consideration of the diagnostic results and the patients response to treatment, I feel that the patent would benefit from discharge.  Abscess-I&D, packing was required, will start on antibiotics due to the size of it, will recommend warm compresses, symptom management follow-up 48 hours for reassessment..            Final Clinical Impression(s) / ED Diagnoses Final diagnoses:  Abscess    Rx / DC Orders ED Discharge Orders          Ordered    doxycycline (VIBRAMYCIN) 100 MG capsule  2 times daily,   Status:  Discontinued        08/20/22 0536    doxycycline (VIBRAMYCIN) 100 MG capsule  2 times daily        08/20/22 0536              Marcello Fennel, PA-C 08/20/22 0538    Marcello Fennel, PA-C 08/20/22 0545    Maudie Flakes, MD 08/20/22 [phone]

## 2022-08-20 NOTE — Discharge Instructions (Addendum)
I have drained your abscess, started on antibiotics take as prescribed.  May use over-the-counter pain medications as needed.  Recommend warm compresses over the area do this twice daily, rinse out the wound and apply new dressings to it.  Follow-up next 48 hours for a wound check, the packing comes out please do not attempt to place it back in to the wound.  If you notice worsening pain, swelling, increased redness despite antibiotic use please come back in for reassessment.

## 2022-08-23 ENCOUNTER — Telehealth: Payer: Self-pay | Admitting: *Deleted

## 2022-08-23 NOTE — Patient Outreach (Signed)
  Care Coordination [facility] Note Transition Care Management Follow-up Telephone Call Date of discharge and from where: 08/20/22 from [name] How have you been since you were released from the hospital? "Doing a little better." Any questions or concerns? No  Items Reviewed: Did the pt receive and understand the discharge instructions provided? Yes  Medications obtained and verified?  Patient picked up antibiotic from pharmacy and is taking. Antibiotic is different than prescribed antibiotic(given amoxicillin, doxycycline was prescribed) Other? Yes RNCM advised patient to contact her pharmacy for medication clarification  Any new allergies since your discharge? No  Dietary orders reviewed? No Do you have support at home? Yes   Home Care and Equipment/Supplies: Were home health services ordered? no If so, what is the name of the agency? N/A  Has the agency set up a time to come to the patient's home? no Were any new equipment or medical supplies ordered?  No What is the name of the medical supply agency? N/A Were you able to get the supplies/equipment? not applicable Do you have any questions related to the use of the equipment or supplies? No  Functional Questionnaire: (I = Independent and D = Dependent) ADLs: I  Bathing/Dressing- I  Meal Prep- I  Eating- I  Maintaining continence- I  Transferring/Ambulation- I  Managing Meds- I  Follow up appointments reviewed:  PCP Hospital f/u appt confirmed? No  RNCM advised patient to schedule with PCP to evaluate abscess. [place] [facility] f/u appt confirmed?  N/A, ED visit  . Are transportation arrangements needed? No  If their condition worsens, is the pt aware to call PCP or go to the Emergency Dept.? Yes Was the patient provided with contact information for the PCP's office or ED? No Was to pt encouraged to call back with questions or concerns? Yes  SDOH assessments and interventions completed:   Yes   Lurena Joiner RN,  [place] RN Care Coordinator

## 2022-08-25 ENCOUNTER — Encounter (HOSPITAL_COMMUNITY): Payer: Self-pay | Admitting: Emergency Medicine

## 2022-08-25 ENCOUNTER — Ambulatory Visit (HOSPITAL_COMMUNITY)
Admission: EM | Admit: 2022-08-25 | Discharge: 2022-08-25 | Disposition: A | Discharge status: Home / Self Care | Payer: Medicaid Other | Attending: Internal Medicine | Admitting: Internal Medicine

## 2022-08-25 ENCOUNTER — Ambulatory Visit: Payer: Self-pay | Admitting: Nurse Practitioner

## 2022-08-25 ENCOUNTER — Ambulatory Visit (INDEPENDENT_AMBULATORY_CARE_PROVIDER_SITE_OTHER): Payer: Medicaid Other

## 2022-08-25 DIAGNOSIS — M25552 Pain in left hip: Secondary | ICD-10-CM | POA: Diagnosis not present

## 2022-08-25 DIAGNOSIS — G35 Multiple sclerosis: Secondary | ICD-10-CM

## 2022-08-25 HISTORY — DX: Multiple sclerosis: G35

## 2022-08-25 LAB — CBC WITH DIFFERENTIAL/PLATELET
Abs Immature Granulocytes: 0.02 10*3/uL (ref 0.00–0.07)
Basophils Absolute: 0.1 10*3/uL (ref 0.0–0.1)
Basophils Relative: 1 %
Eosinophils Absolute: 0.2 10*3/uL (ref 0.0–0.5)
Eosinophils Relative: 2 %
HCT: 29.1 % — ABNORMAL LOW (ref 36.0–46.0)
Hemoglobin: 9.1 g/dL — ABNORMAL LOW (ref 12.0–15.0)
Immature Granulocytes: 0 %
Lymphocytes Relative: 28 %
Lymphs Abs: 2.1 10*3/uL (ref 0.7–4.0)
MCH: 23.4 pg — ABNORMAL LOW (ref 26.0–34.0)
MCHC: 31.3 g/dL (ref 30.0–36.0)
MCV: 74.8 fL — ABNORMAL LOW (ref 80.0–100.0)
Monocytes Absolute: 0.4 10*3/uL (ref 0.1–1.0)
Monocytes Relative: 6 %
Neutro Abs: 4.8 10*3/uL (ref 1.7–7.7)
Neutrophils Relative %: 63 %
Platelets: 372 10*3/uL (ref 150–400)
RBC: 3.89 MIL/uL (ref 3.87–5.11)
RDW: 18.6 % — ABNORMAL HIGH (ref 11.5–15.5)
WBC: 7.5 10*3/uL (ref 4.0–10.5)
nRBC: 0 % (ref 0.0–0.2)

## 2022-08-25 LAB — C-REACTIVE PROTEIN: CRP: 0.5 mg/dL (ref <1.0)

## 2022-08-25 MED ORDER — KETOROLAC TROMETHAMINE 30 MG/ML IJ SOLN
30.0000 mg | Freq: Once | INTRAMUSCULAR | Status: AC
  Administered 2022-08-25: 30 mg via INTRAMUSCULAR

## 2022-08-25 MED ORDER — METHYLPREDNISOLONE SODIUM SUCC 125 MG IJ SOLR
INTRAMUSCULAR | Status: AC
  Filled 2022-08-25: qty 2

## 2022-08-25 MED ORDER — KETOROLAC TROMETHAMINE 30 MG/ML IJ SOLN
INTRAMUSCULAR | Status: AC
  Filled 2022-08-25: qty 1

## 2022-08-25 MED ORDER — METHYLPREDNISOLONE SODIUM SUCC 125 MG IJ SOLR
80.0000 mg | Freq: Once | INTRAMUSCULAR | Status: AC
  Administered 2022-08-25: 80 mg via INTRAMUSCULAR

## 2022-08-25 MED ORDER — METHYLPREDNISOLONE 4 MG PO TBPK: ORAL_TABLET | ORAL | 0 refills | Status: DC

## 2022-08-25 NOTE — Progress Notes (Deleted)
[facility] [place] [address]                                   Transitional Care Clinic   Post Hospital Discharge Acute Issues Care Follow Up                                                                        Patient Demographics  Denise Moses, is a 42 y.o. female  DOB 07/16/1981  MRN [id number].  Primary MD  Bo Merino I, NP   Reason for TCC follow Up - ***   Past Medical History:  Diagnosis Date   Abnormal MRI, cervical spine    Anemia    Elective abortion    Heart murmur    Herpes    Infection    UTI   Pregnancy induced hypertension    2001   Vitamin D deficiency     Past Surgical History:  Procedure Laterality Date   CESAREAN SECTION N/A 03/23/2020   Procedure: CESAREAN SECTION;  Surgeon: Jonnie Kind, MD;  Location: [facility];  Service: Obstetrics;  Laterality: N/A;  Arrest of Dilation   CHOLECYSTECTOMY     LAPAROSCOPY N/A 06/09/2016   Procedure: LAPAROSCOPY OPERATIVE;  Surgeon: Osborne Oman, MD;  Location: [facility];  Service: Gynecology;  Laterality: N/A;  ectopic    THERAPEUTIC ABORTION     x 4   TUBAL LIGATION     UNILATERAL SALPINGECTOMY Left 06/09/2016   Procedure: UNILATERAL SALPINGECTOMY, REMOVAL OF IUD;  Surgeon: Osborne Oman, MD;  Location: [facility];  Service: Gynecology;  Laterality: Left;   WISDOM TOOTH EXTRACTION         Recent HPI and Hospital Course  ***  [facility] Acute Care Issue to be followed in the Clinic   ***   Subjective:   Denise Moses today has, No headache, No chest pain, No abdominal pain - No Nausea, No new weakness tingling or numbness, No Cough - SOB. ********  Assessment & Plan    There are no diagnoses linked to this encounter.   Reason for frequent admissions/ER visits **      Objective:   There were no vitals filed for this visit.  Wt Readings from Last 3 Encounters:  08/20/22 190 lb (86.2 kg)   12/24/21 200 lb 6.4 oz (90.9 kg)  06/16/21 214 lb (97.1 kg)    Allergies as of 08/25/2022   No Known Allergies      Medication List        Accurate as of August 25, 2022  7:55 AM. If you have any questions, ask your nurse or doctor.          amoxicillin 500 MG capsule Commonly known as: AMOXIL Take 500 mg by mouth 3 (three) times daily.   cephALEXin 500 MG capsule Commonly known as: KEFLEX Take 1 capsule (500 mg total) by mouth 4 (four) times daily.   doxycycline 100 MG capsule Commonly known as: VIBRAMYCIN Take 1 capsule (100 mg total) by mouth 2 (two) times daily for 7 days.   gabapentin  300 MG capsule Commonly known as: NEURONTIN Take 1 capsule (300 mg total) by mouth 3 (three) times daily.   IRON PO Take by mouth.   MULTIVITAMIN ADULT PO Take by mouth.   NIFEdipine 90 MG 24 hr tablet Commonly known as: PROCARDIA XL/NIFEDICAL-XL Take 1 tablet (90 mg total) by mouth daily.   Shingrix injection Generic drug: Zoster Vaccine Adjuvanted Please complete the shingrix according to protocol prior to Ocrelizumab infusion   Tecfidera 240 MG Cpdr Generic drug: Dimethyl Fumarate Take 240 mg by mouth 2 (two) times daily.   Vitamin D (Ergocalciferol) 1.25 MG (50000 UNIT) Caps capsule Commonly known as: DRISDOL Take 1 capsule (50,000 Units total) by mouth every 7 (seven) days.         Physical Exam: Constitutional: Patient appears well-developed and well-nourished. Not in obvious distress. HENT: Normocephalic, atraumatic, External right and left ear normal. Oropharynx is clear and moist.  Eyes: Conjunctivae and EOM are normal. PERRLA, no scleral icterus. Neck: Normal ROM. Neck supple. No JVD. No tracheal deviation. No thyromegaly. CVS: RRR, S1/S2 +, no murmurs, no gallops, no carotid bruit.  Pulmonary: Effort and breath sounds normal, no stridor, rhonchi, wheezes, rales.  Abdominal: Soft. BS +, no distension, tenderness, rebound or guarding.   Musculoskeletal: Normal range of motion. No edema and no tenderness.  Lymphadenopathy: No lymphadenopathy noted, cervical, inguinal or axillary Neuro: Alert. Normal reflexes, muscle tone coordination. No cranial nerve deficit. Skin: Skin is warm and dry. No rash noted. Not diaphoretic. No erythema. No pallor. Psychiatric: Normal mood and affect. Behavior, judgment, thought content normal.   Data Review   Micro Results No results found for this or any previous visit (from the past 240 hour(s)).   CBC No results for input(s): "WBC", "HGB", "HCT", "PLT", "MCV", "MCH", "MCHC", "RDW", "LYMPHSABS", "MONOABS", "EOSABS", "BASOSABS", "BANDABS" in the last 168 hours.  Invalid input(s): "NEUTRABS", "BANDSABD"  Chemistries  No results for input(s): "NA", "K", "CL", "CO2", "GLUCOSE", "BUN", "CREATININE", "CALCIUM", "MG", "AST", "ALT", "ALKPHOS", "BILITOT" in the last 168 hours.  Invalid input(s): "GFRCGP" ------------------------------------------------------------------------------------------------------------------ CrCl cannot be calculated (Patient's most recent lab result is older than the maximum 21 days allowed.). ------------------------------------------------------------------------------------------------------------------ No results for input(s): "HGBA1C" in the last 72 hours. ------------------------------------------------------------------------------------------------------------------ No results for input(s): "CHOL", "HDL", "LDLCALC", "TRIG", "CHOLHDL", "LDLDIRECT" in the last 72 hours. ------------------------------------------------------------------------------------------------------------------ No results for input(s): "TSH", "T4TOTAL", "T3FREE", "THYROIDAB" in the last 72 hours.  Invalid input(s): "FREET3" ------------------------------------------------------------------------------------------------------------------ No results for input(s): "VITAMINB12", "FOLATE",  "FERRITIN", "TIBC", "IRON", "RETICCTPCT" in the last 72 hours.  Coagulation profile No results for input(s): "INR", "PROTIME" in the last 168 hours.  No results for input(s): "DDIMER" in the last 72 hours.  Cardiac Enzymes No results for input(s): "CKMB", "TROPONINI", "MYOGLOBIN" in the last 168 hours.  Invalid input(s): "CK" ------------------------------------------------------------------------------------------------------------------ Invalid input(s): "POCBNP"   Time Spent in minutes  45 ***   Elyse Jarvis M.D on 08/25/2022 at 7:55 AM   **Disclaimer: This note may have been dictated with voice recognition software. Similar sounding words can inadvertently be transcribed and this note may contain transcription errors which may not have been corrected upon publication of note.**

## 2022-08-25 NOTE — ED Provider Notes (Signed)
[facility]   [id number] 08/25/22 Arrival Time: 5009  ASSESSMENT & PLAN:  1. Relapsing remitting multiple sclerosis (HCC)    -Etiology of patient's symptoms is difficult to ascertain.  Due to her recent abscess with I&D, will be prudent to rule out bacteremia/infectious spread into hip joint or surrounding soft tissue.  Thankfully today she is afebrile and not tachycardic, not ill-appearing.  X-ray of her hip showed no acute osseous findings or significant arthropathy changes.  Will rule out insidious infection with blood work, CBC and CRP.  If these are significantly elevated, would recommend she go to the ER for blood cultures and further investigation into infectious etiology.  For now, we will treat this symptomatically as an MS flare.  She denies any neurologic changes such as headache, blurry vision, loss of bowel control, but the significant weakness is concerning for neurologic cause.  Will treat her pain today with a IM injection of Toradol and Depo-Medrol.  Will also prescribe her Medrol Dosepak to try to treat the MS flare.  I recommended she contact her neurologist to try to get an appointment there.  She does have an upcoming appointment in April already.  All questions were answered and patient agrees to plan.  ER precautions were given.  This is a chronic illness with exacerbation and I performed prescription drug management  Meds ordered this encounter  Medications   ketorolac (TORADOL) 30 MG/ML injection 30 mg   methylPREDNISolone sodium succinate (SOLU-MEDROL) 125 mg/2 mL injection 80 mg   methylPREDNISolone (MEDROL DOSEPAK) 4 MG TBPK tablet    Sig: Take as directed on packaging    Dispense:  21 tablet    Refill:  0   Discharge Instructions   None     Follow-up Information     Schedule an appointment as soon as possible for a visit  with Denise Pacas, MD.   Specialty: Neurology Contact information: [place]  [id number] [phone]                  Reviewed expectations re: course of current medical issues. Questions answered. Outlined signs and symptoms indicating need for more acute intervention. Patient verbalized understanding. After Visit Summary given.   SUBJECTIVE: Denise Moses 42 year old female with past medical history of relapsing remitting MS comes urgent care to be evaluated for left lower extremity pain and weakness.    Her pain started about 3 to 4 days ago.  She was seen last week in emergency department for an abscess in her left axilla.  She had this I&D done there.  She was prescribed doxycycline and reports taking all of the antibiotics.  She spent several days on the couch recovering from her I&D and when she tried to stand up and walk after that she says she felt too weak.  She reports significant sharp pain over the anterior lateral part of the left hip.  Radiates down to the knee.  She also notes significant weakness in the left lower extremity.  This is preventing her from walking.  Denies any trauma.  She denies any numbness or tingling down the leg.    She is currently on Tecfidera and gabapentin and followed by neurology for her MS.  She reports compliance with the medications.  She denies any fevers, blurry vision, loss of vision, loss of bowel control, urgent continence.   Denies any discharge from her axilla.  Denies any erythema or concern for spreading infection there.  Patient's last menstrual  period was 08/20/2022. Past Surgical History:  Procedure Laterality Date   CESAREAN SECTION N/A 03/23/2020   Procedure: CESAREAN SECTION;  Surgeon: Jonnie Kind, MD;  Location: [facility];  Service: Obstetrics;  Laterality: N/A;  Arrest of Dilation   CHOLECYSTECTOMY     LAPAROSCOPY N/A 06/09/2016   Procedure: LAPAROSCOPY OPERATIVE;  Surgeon: Osborne Oman, MD;  Location: [facility];  Service: Gynecology;  Laterality: N/A;  ectopic    THERAPEUTIC ABORTION     x 4    TUBAL LIGATION     UNILATERAL SALPINGECTOMY Left 06/09/2016   Procedure: UNILATERAL SALPINGECTOMY, REMOVAL OF IUD;  Surgeon: Osborne Oman, MD;  Location: [facility];  Service: Gynecology;  Laterality: Left;   WISDOM TOOTH EXTRACTION       OBJECTIVE:  Vitals:   08/25/22 0903  BP: 134/76  Pulse: 70  Resp: 17  Temp: 98.3 F (36.8 C)  TempSrc: Oral  SpO2: 98%     Physical Exam Vitals reviewed.  Constitutional:      Appearance: Normal appearance.     Comments: Mildly uncomfortable sitting in wheelchair  HENT:     Head: Normocephalic.  Eyes:     General: No visual field deficit.    Extraocular Movements: Extraocular movements intact.     Visual Fields: Right eye visual fields normal and left eye visual fields normal.  Cardiovascular:     Rate and Rhythm: Normal rate.  Pulmonary:     Effort: Pulmonary effort is normal. No respiratory distress.  Musculoskeletal:     Comments: LLE -no obvious deformity of the left hip.  She is mildly tender to palpation throughout the anterior and lateral aspect of the left hip.  She is tender to palpation over the greater trochanter.  Range of motion in flexion extension is limited by pain.  4/5 strength with resisted hip flexion.  Unable to tolerate FADIR and FABER.  Left knee there is no obvious deformity.  No effusion.  No erythema.  She is nontender palpation at medial or lateral joint line.  Range of motion flexion extension is limited by pain, 20-90 degrees.  No ligamentous instability with valgus or varus stressing.  Unable to fully weight-bear on left lower extremity.  Skin:    Comments: Left axilla -I&D site examined.  Her incision is well-approximated.  There is no surrounding erythema or purulent drainage.  She is nontender there.  Neurological:     General: No focal deficit present.     Mental Status: She is alert.      Labs: Results for orders placed or performed during the hospital encounter of 04/23/22  Urine Culture    Specimen: Urine, Clean Catch  Result Value Ref Range   Specimen Description URINE, CLEAN CATCH    Special Requests      NONE Performed at [facility], [place] [address], [place], [place] [address]    Culture >=100,000 COLONIES/mL KLEBSIELLA PNEUMONIAE (A)    Report Status 04/25/2022 FINAL    Organism ID, Bacteria KLEBSIELLA PNEUMONIAE (A)       Susceptibility   Klebsiella pneumoniae - MIC*    AMPICILLIN RESISTANT Resistant     CEFAZOLIN <=4 SENSITIVE Sensitive     CEFEPIME <=0.12 SENSITIVE Sensitive     CEFTRIAXONE <=0.25 SENSITIVE Sensitive     CIPROFLOXACIN <=0.25 SENSITIVE Sensitive     GENTAMICIN <=1 SENSITIVE Sensitive     IMIPENEM <=0.25 SENSITIVE Sensitive     NITROFURANTOIN 64 INTERMEDIATE Intermediate     TRIMETH/SULFA <=  20 SENSITIVE Sensitive     AMPICILLIN/SULBACTAM 4 SENSITIVE Sensitive     PIP/TAZO <=4 SENSITIVE Sensitive     * >=100,000 COLONIES/mL KLEBSIELLA PNEUMONIAE  POCT Urinalysis Dipstick (ED/UC)  Result Value Ref Range   Glucose, UA NEGATIVE NEGATIVE mg/dL   Bilirubin Urine NEGATIVE NEGATIVE   Ketones, ur NEGATIVE NEGATIVE mg/dL   Specific Gravity, Urine 1.020 1.005 - 1.030   Hgb urine dipstick NEGATIVE NEGATIVE   pH 7.0 5.0 - 8.0   Protein, ur NEGATIVE NEGATIVE mg/dL   Urobilinogen, UA 0.2 0.0 - 1.0 mg/dL   Nitrite NEGATIVE NEGATIVE   Leukocytes,Ua SMALL (A) NEGATIVE  Cervicovaginal ancillary only  Result Value Ref Range   Neisseria Gonorrhea Negative    Chlamydia Negative    Trichomonas Negative    Bacterial Vaginitis (gardnerella) Negative    Candida Vaginitis Positive (A)    Candida Glabrata Negative    Comment      Normal Reference Range Bacterial Vaginosis - Negative   Comment Normal Reference Range Candida Species - Negative    Comment Normal Reference Range Candida Galbrata - Negative    Comment Normal Reference Range Trichomonas - Negative    Comment Normal Reference Ranger Chlamydia - Negative    Comment      Normal Reference  Range Neisseria Gonorrhea - Negative   Labs Reviewed  C-REACTIVE PROTEIN  CBC WITH DIFFERENTIAL/PLATELET    Imaging: DG Hip Unilat With Pelvis 2-3 Views Left  Result Date: 08/25/2022 CLINICAL DATA:  Left hip and knee pain with inability to walk over the last 2 days. History of recent axillary abscess drainage. EXAM: DG HIP (WITH OR WITHOUT PELVIS) 2-3V LEFT COMPARISON:  Pelvic and right hip radiographs 05/12/2014. FINDINGS: The mineralization and alignment are normal. There is no evidence of acute fracture or dislocation. No evidence of femoral head osteonecrosis or osteomyelitis. The hip joint spaces are preserved. Stable asymmetric sclerosis around the right sacroiliac joint. Stable scattered bone islands and bilateral pelvic phleboliths. The soft tissues appear unremarkable. IMPRESSION: No acute osseous findings or significant arthropathic changes in the left hip. 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 Electronically Signed   By: Richardean Sale M.D.   On: 08/25/2022 09:47     No Known Allergies                                             Past Medical History:  Diagnosis Date   Abnormal MRI, cervical spine    Anemia    Elective abortion    Heart murmur    Herpes    Infection    UTI   Pregnancy induced hypertension    2001   Vitamin D deficiency     Social History   Socioeconomic History   Marital status: Single    Spouse name: Not on file   Number of children: 6   Years of education: some college   Highest education level: Not on file  Occupational History   Occupation: Unemployed  Tobacco Use   Smoking status: Former    Packs/day: 0.25    Years: 10.00    Total pack years: 2.50    Types: Cigarettes    Quit date: 03/2019    Years since quitting: 3.4   Smokeless tobacco: Never   Tobacco comments:    2-3 cigs/day  Vaping Use   Vaping Use: Never used  Substance and Sexual Activity   Alcohol use: Not Currently    Comment: social  drinker   Drug use: Yes    Frequency: 5.0 times per week    Types: Marijuana    Comment: last used before delivery   Sexual activity: Not Currently    Partners: Male    Birth control/protection: Surgical  Other Topics Concern   Not on file  Social History Narrative   Lives at home with children.   Right-handed.   No daily caffeine use.   Social Determinants of Health   Financial Resource Strain: Low Risk  (04/08/2020)   Overall Financial Resource Strain (CARDIA)    Difficulty of Paying Living Expenses: Not hard at all  Food Insecurity: Food Insecurity Present (05/20/2020)   Hunger Vital Sign    Worried About Running Out of Food in the Last Year: Sometimes true    Ran Out of Food in the Last Year: Sometimes true  Transportation Needs: No Transportation Needs (08/23/2022)   PRAPARE - Hydrologist (Medical): No    Lack of Transportation (Non-Medical): No  Physical Activity: Insufficiently Active (05/20/2020)   Exercise Vital Sign    Days of Exercise per Week: 2 days    Minutes of Exercise per Session: 30 min  Stress: Stress Concern Present (04/08/2020)   [place]    Feeling of Stress : To some extent  Social Connections: Moderately Isolated (04/08/2020)   Social Connection and Isolation Panel [NHANES]    Frequency of Communication with Friends and Family: More than three times a week    Frequency of Social Gatherings with Friends and Family: More than three times a week    Attends Religious Services: More than 4 times per year    Active Member of [employer] or Organizations: No    Attends Archivist Meetings: Never    Marital Status: Never married  Intimate Partner Violence: Not At Risk (04/08/2020)   Humiliation, Afraid, Rape, and Kick questionnaire    Fear of Current or Ex-Partner: No    Emotionally Abused: No    Physically Abused: No    Sexually Abused: No    Family History   Problem Relation Age of Onset   Breast cancer Mother 14       was a smoker, alcohol drinker in early life    Breast cancer Maternal Aunt    Cancer Maternal Grandfather        unsure of type   Other Father        unsure of history   Breast cancer Maternal Aunt       Delsa Walder, Dorian Pod, MD 08/25/22 [phone]

## 2022-08-25 NOTE — ED Triage Notes (Signed)
Pt reports last week at Ed for abscess drained in axilla area. Was sick and been in the bed after that day.  Pt reports that from left hip to knee pain and now unable to walk that started Monday. Denies injury or lifting. Took ibuprofen and tylenol for pain and not helping.

## 2022-08-26 ENCOUNTER — Ambulatory Visit (INDEPENDENT_AMBULATORY_CARE_PROVIDER_SITE_OTHER): Payer: Medicaid Other | Admitting: Nurse Practitioner

## 2022-08-26 ENCOUNTER — Telehealth: Payer: Self-pay | Admitting: Neurology

## 2022-08-26 ENCOUNTER — Encounter: Payer: Self-pay | Admitting: Nurse Practitioner

## 2022-08-26 VITALS — BP 162/88 | HR 86 | Temp 97.7°F | Ht 66.0 in | Wt 190.0 lb

## 2022-08-26 DIAGNOSIS — G35 Multiple sclerosis: Secondary | ICD-10-CM | POA: Diagnosis not present

## 2022-08-26 MED ORDER — METHYLPREDNISOLONE SODIUM SUCC 40 MG IJ SOLR
40.0000 mg | Freq: Once | INTRAMUSCULAR | Status: AC
  Administered 2022-08-26: 40 mg via INTRAMUSCULAR

## 2022-08-26 MED ORDER — PREDNISONE 10 MG PO TABS: ORAL_TABLET | ORAL | 0 refills | Status: DC

## 2022-08-26 MED ORDER — FLUCONAZOLE 150 MG PO TABS: 150.0000 mg | ORAL_TABLET | Freq: Every day | ORAL | 0 refills | Status: DC

## 2022-08-26 NOTE — Telephone Encounter (Signed)
Called pt back. Informed her of message nurse Katie sent. Pt said she can't walk and that's why she needs to be seen earlier. I informed pt I could add her to the wait-list and she would be notified if something sooner came available.

## 2022-08-26 NOTE — Telephone Encounter (Signed)
Pt called stating that she is wanting to come in sooner than her scheduled appt. I informed pt there was nothing left sooner than April 4th  She is requesting for RN to call back to be fit in.

## 2022-08-26 NOTE — Patient Instructions (Signed)
1. Relapsing remitting multiple sclerosis (HCC)  - predniSONE (DELTASONE) 10 MG tablet; Take 4 tabs for 2 days, then 3 tabs for 2 days, then 2 tabs for 2 days, then 1 tab for 2 days, then stop  Dispense: 20 tablet; Refill: 0 - start tomorrow   - methylPREDNISolone sodium succinate (SOLU-MEDROL) 40 mg/mL injection 40 mg   Please follow with neurology  Follow up:  Follow up as scheduled

## 2022-08-26 NOTE — Assessment & Plan Note (Signed)
-  predniSONE (DELTASONE) 10 MG tablet; Take 4 tabs for 2 days, then 3 tabs for 2 days, then 2 tabs for 2 days, then 1 tab for 2 days, then stop  Dispense: 20 tablet; Refill: 0 - start tomorrow   - methylPREDNISolone sodium succinate (SOLU-MEDROL) 40 mg/mL injection 40 mg   Please follow with neurology  Follow up:  Follow up as scheduled

## 2022-08-26 NOTE — Progress Notes (Signed)
$'@Patient'k$  ID: Denise Moses, female    DOB: [birth year], 42 y.o.   MRN: [id number]  Chief Complaint  Patient presents with   Follow-up    MS Flare up, just now able to move at all in a lot of pain    Referring provider: Bo Merino I, NP   HPI  Patient presents today to follow-up with MS flare.  Patient was seen in the ED yesterday and was given Solu-Medrol injection and sent home with steroid prescription.  1 prednisone this morning.  We will give her another Solu-Medrol injection today.  We will give her a taper dose of prednisone to start tomorrow. Denies f/c/s, n/v/d, hemoptysis, PND, leg swelling Denies chest pain or edema     No Known Allergies  Immunization History  Administered Date(s) Administered   Tdap 03/02/2020    Past Medical History:  Diagnosis Date   Abnormal MRI, cervical spine    Anemia    Elective abortion    Heart murmur    Herpes    Infection    UTI   Multiple sclerosis ([name])    Pregnancy induced hypertension    2001   Vitamin D deficiency     Tobacco History: Social History   Tobacco Use  Smoking Status Former   Packs/day: 0.25   Years: 10.00   Total pack years: 2.50   Types: Cigarettes   Quit date: 03/2019   Years since quitting: 3.4  Smokeless Tobacco Never  Tobacco Comments   2-3 cigs/day   Counseling given: Not Answered Tobacco comments: 2-3 cigs/day   Outpatient Encounter Medications as of 08/26/2022  Medication Sig   amoxicillin (AMOXIL) 500 MG capsule Take 500 mg by mouth 3 (three) times daily.   fluconazole (DIFLUCAN) 150 MG tablet Take 1 tablet (150 mg total) by mouth daily.   predniSONE (DELTASONE) 10 MG tablet Take 4 tabs for 2 days, then 3 tabs for 2 days, then 2 tabs for 2 days, then 1 tab for 2 days, then stop   [DISCONTINUED] methylPREDNISolone (MEDROL DOSEPAK) 4 MG TBPK tablet Take as directed on packaging   cephALEXin (KEFLEX) 500 MG capsule Take 1 capsule (500 mg total) by mouth 4 (four) times  daily. (Patient not taking: Reported on 08/23/2022)   doxycycline (VIBRAMYCIN) 100 MG capsule Take 1 capsule (100 mg total) by mouth 2 (two) times daily for 7 days. (Patient not taking: Reported on 08/23/2022)   Ferrous Sulfate (IRON PO) Take by mouth. (Patient not taking: Reported on 04/28/2021)   gabapentin (NEURONTIN) 300 MG capsule Take 1 capsule (300 mg total) by mouth 3 (three) times daily. (Patient not taking: Reported on 04/28/2021)   Multiple Vitamin (MULTIVITAMIN ADULT PO) Take by mouth. (Patient not taking: Reported on 04/28/2021)   NIFEdipine (PROCARDIA XL/NIFEDICAL-XL) 90 MG 24 hr tablet Take 1 tablet (90 mg total) by mouth daily. (Patient not taking: Reported on 04/28/2021)   TECFIDERA 240 MG CPDR Take 240 mg by mouth 2 (two) times daily. (Patient not taking: Reported on 04/28/2021)   Vitamin D, Ergocalciferol, (DRISDOL) 1.25 MG (50000 UNIT) CAPS capsule Take 1 capsule (50,000 Units total) by mouth every 7 (seven) days. (Patient not taking: Reported on 04/28/2021)   Zoster Vaccine Adjuvanted [facility]) injection Please complete the shingrix according to protocol prior to Ocrelizumab infusion (Patient not taking: Reported on 04/28/2021)   [EXPIRED] methylPREDNISolone sodium succinate (SOLU-MEDROL) 40 mg/mL injection 40 mg    No facility-administered encounter medications on file as of 08/26/2022.     Review  of Systems  Review of Systems  Constitutional: Negative.   HENT: Negative.    Cardiovascular: Negative.   Gastrointestinal: Negative.   Musculoskeletal:        Left leg pain  Allergic/Immunologic: Negative.   Neurological: Negative.   Psychiatric/Behavioral: Negative.         Physical Exam  BP (!) 162/88   Pulse 86   Temp 97.7 F (36.5 C) (Temporal)   Ht '5\' 6"'$  (1.676 m)   Wt 190 lb (86.2 kg) Comment: pt guestimation  LMP 08/20/2022   SpO2 100%   BMI 30.67 kg/m   Wt Readings from Last 5 Encounters:  08/26/22 190 lb (86.2 kg)  08/20/22 190 lb (86.2 kg)  12/24/21 200  lb 6.4 oz (90.9 kg)  06/16/21 214 lb (97.1 kg)  04/28/21 222 lb (100.7 kg)     Physical Exam Vitals and nursing note reviewed.  Constitutional:      General: She is not in acute distress.    Appearance: She is well-developed.  Cardiovascular:     Rate and Rhythm: Normal rate and regular rhythm.  Pulmonary:     Effort: Pulmonary effort is normal.     Breath sounds: Normal breath sounds.  Neurological:     Mental Status: She is alert and oriented to person, place, and time.      Lab Results:  CBC    Component Value Date/Time   WBC 7.5 08/25/2022 0934   RBC 3.89 08/25/2022 0934   HGB 9.1 (L) 08/25/2022 0934   HGB 10.0 (L) 04/28/2021 1203   HCT 29.1 (L) 08/25/2022 0934   HCT 31.9 (L) 04/28/2021 1203   PLT 372 08/25/2022 0934   PLT 300 04/28/2021 1203   MCV 74.8 (L) 08/25/2022 0934   MCV 79 04/28/2021 1203   MCH 23.4 (L) 08/25/2022 0934   MCHC 31.3 08/25/2022 0934   RDW 18.6 (H) 08/25/2022 0934   RDW 15.9 (H) 04/28/2021 1203   LYMPHSABS 2.1 08/25/2022 0934   LYMPHSABS 2.3 04/28/2021 1203   MONOABS 0.4 08/25/2022 0934   EOSABS 0.2 08/25/2022 0934   EOSABS 0.2 04/28/2021 1203   BASOSABS 0.1 08/25/2022 0934   BASOSABS 0.1 04/28/2021 1203    BMET    Component Value Date/Time   NA 139 04/28/2021 1203   K 3.9 04/28/2021 1203   CL 104 04/28/2021 1203   CO2 22 04/28/2021 1203   GLUCOSE 80 04/28/2021 1203   GLUCOSE 92 03/24/2020 1302   BUN 8 04/28/2021 1203   CREATININE 0.86 04/28/2021 1203   CREATININE 0.74 05/22/2015 1631   CALCIUM 9.2 04/28/2021 1203   GFRNONAA 102 09/11/2020 0827   GFRAA 117 09/11/2020 0827    BNP No results found for: "BNP"  ProBNP No results found for: "PROBNP"  Imaging: DG Hip Unilat With Pelvis 2-3 Views Left  Result Date: 08/25/2022 CLINICAL DATA:  Left hip and knee pain with inability to walk over the last 2 days. History of recent axillary abscess drainage. EXAM: DG HIP (WITH OR WITHOUT PELVIS) 2-3V LEFT COMPARISON:  Pelvic and  right hip radiographs 05/12/2014. FINDINGS: The mineralization and alignment are normal. There is no evidence of acute fracture or dislocation. No evidence of femoral head osteonecrosis or osteomyelitis. The hip joint spaces are preserved. Stable asymmetric sclerosis around the right sacroiliac joint. Stable scattered bone islands and bilateral pelvic phleboliths. The soft tissues appear unremarkable. IMPRESSION: No acute osseous findings or significant arthropathic changes in the left hip. Stable chronic asymmetric sclerosis around the right sacroiliac  joint. Electronically Signed   By: Richardean Sale M.D.   On: 08/25/2022 09:47     Assessment & Plan:   Relapsing remitting multiple sclerosis (HCC) - predniSONE (DELTASONE) 10 MG tablet; Take 4 tabs for 2 days, then 3 tabs for 2 days, then 2 tabs for 2 days, then 1 tab for 2 days, then stop  Dispense: 20 tablet; Refill: 0 - start tomorrow   - methylPREDNISolone sodium succinate (SOLU-MEDROL) 40 mg/mL injection 40 mg   Please follow with neurology  Follow up:  Follow up as scheduled     Fenton Foy, NP 08/26/2022

## 2022-09-06 ENCOUNTER — Emergency Department (HOSPITAL_COMMUNITY): Payer: Medicaid Other

## 2022-09-06 ENCOUNTER — Encounter (HOSPITAL_COMMUNITY): Payer: Self-pay

## 2022-09-06 ENCOUNTER — Other Ambulatory Visit: Payer: Self-pay

## 2022-09-06 ENCOUNTER — Emergency Department (HOSPITAL_COMMUNITY)
Admission: EM | Admit: 2022-09-06 | Discharge: 2022-09-06 | Disposition: A | Discharge status: Home / Self Care | Payer: Medicaid Other | Attending: Emergency Medicine | Admitting: Emergency Medicine

## 2022-09-06 DIAGNOSIS — G35 Multiple sclerosis: Secondary | ICD-10-CM | POA: Diagnosis not present

## 2022-09-06 DIAGNOSIS — G9389 Other specified disorders of brain: Secondary | ICD-10-CM | POA: Diagnosis not present

## 2022-09-06 DIAGNOSIS — M5432 Sciatica, left side: Secondary | ICD-10-CM | POA: Diagnosis present

## 2022-09-06 DIAGNOSIS — M5124 Other intervertebral disc displacement, thoracic region: Secondary | ICD-10-CM | POA: Diagnosis not present

## 2022-09-06 DIAGNOSIS — R531 Weakness: Secondary | ICD-10-CM | POA: Diagnosis not present

## 2022-09-06 DIAGNOSIS — D1800 Hemangioma unspecified site: Secondary | ICD-10-CM | POA: Diagnosis not present

## 2022-09-06 DIAGNOSIS — M5442 Lumbago with sciatica, left side: Secondary | ICD-10-CM | POA: Diagnosis not present

## 2022-09-06 DIAGNOSIS — M5126 Other intervertebral disc displacement, lumbar region: Secondary | ICD-10-CM | POA: Diagnosis not present

## 2022-09-06 LAB — URINALYSIS, ROUTINE W REFLEX MICROSCOPIC
Bacteria, UA: NONE SEEN
Bilirubin Urine: NEGATIVE
Glucose, UA: NEGATIVE mg/dL
Hgb urine dipstick: NEGATIVE
Ketones, ur: NEGATIVE mg/dL
Nitrite: NEGATIVE
Protein, ur: NEGATIVE mg/dL
Specific Gravity, Urine: 1.017 (ref 1.005–1.030)
pH: 7 (ref 5.0–8.0)

## 2022-09-06 LAB — COMPREHENSIVE METABOLIC PANEL
ALT: 14 U/L (ref 0–44)
AST: 18 U/L (ref 15–41)
Albumin: 4.1 g/dL (ref 3.5–5.0)
Alkaline Phosphatase: 51 U/L (ref 38–126)
Anion gap: 9 (ref 5–15)
BUN: 8 mg/dL (ref 6–20)
CO2: 21 mmol/L — ABNORMAL LOW (ref 22–32)
Calcium: 9.5 mg/dL (ref 8.9–10.3)
Chloride: 104 mmol/L (ref 98–111)
Creatinine, Ser: 0.74 mg/dL (ref 0.44–1.00)
GFR, Estimated: >60 mL/min (ref >60)
Glucose, Bld: 94 mg/dL (ref 70–99)
Potassium: 4.2 mmol/L (ref 3.5–5.1)
Sodium: 134 mmol/L — ABNORMAL LOW (ref 135–145)
Total Bilirubin: 0.2 mg/dL — ABNORMAL LOW (ref 0.3–1.2)
Total Protein: 7.6 g/dL (ref 6.5–8.1)

## 2022-09-06 LAB — CBC
HCT: 31.6 % — ABNORMAL LOW (ref 36.0–46.0)
Hemoglobin: 9.9 g/dL — ABNORMAL LOW (ref 12.0–15.0)
MCH: 23.5 pg — ABNORMAL LOW (ref 26.0–34.0)
MCHC: 31.3 g/dL (ref 30.0–36.0)
MCV: 74.9 fL — ABNORMAL LOW (ref 80.0–100.0)
Platelets: 427 10*3/uL — ABNORMAL HIGH (ref 150–400)
RBC: 4.22 MIL/uL (ref 3.87–5.11)
RDW: 19.7 % — ABNORMAL HIGH (ref 11.5–15.5)
WBC: 10.8 10*3/uL — ABNORMAL HIGH (ref 4.0–10.5)
nRBC: 0 % (ref 0.0–0.2)

## 2022-09-06 LAB — DIFFERENTIAL
Abs Immature Granulocytes: 0.04 10*3/uL (ref 0.00–0.07)
Basophils Absolute: 0.1 10*3/uL (ref 0.0–0.1)
Basophils Relative: 1 %
Eosinophils Absolute: 0 10*3/uL (ref 0.0–0.5)
Eosinophils Relative: 0 %
Immature Granulocytes: 0 %
Lymphocytes Relative: 10 %
Lymphs Abs: 1 10*3/uL (ref 0.7–4.0)
Monocytes Absolute: 0.2 10*3/uL (ref 0.1–1.0)
Monocytes Relative: 2 %
Neutro Abs: 9.4 10*3/uL — ABNORMAL HIGH (ref 1.7–7.7)
Neutrophils Relative %: 87 %

## 2022-09-06 LAB — PROTIME-INR
INR: 1 (ref 0.8–1.2)
Prothrombin Time: 13.2 seconds (ref 11.4–15.2)

## 2022-09-06 LAB — POC URINE PREG, ED: Preg Test, Ur: NEGATIVE

## 2022-09-06 LAB — APTT: aPTT: 28 seconds (ref 24–36)

## 2022-09-06 MED ORDER — DICLOFENAC EPOLAMINE 1.3 % EX PTCH: 1.0000 | MEDICATED_PATCH | Freq: Two times a day (BID) | CUTANEOUS | 0 refills | Status: DC

## 2022-09-06 MED ORDER — GADOBUTROL 1 MMOL/ML IV SOLN
8.5000 mL | Freq: Once | INTRAVENOUS | Status: AC | PRN
  Administered 2022-09-06: 8.5 mL via INTRAVENOUS

## 2022-09-06 MED ORDER — HYDROCODONE-ACETAMINOPHEN 5-325 MG PO TABS
2.0000 | ORAL_TABLET | Freq: Once | ORAL | Status: AC
  Administered 2022-09-06: 2 via ORAL
  Filled 2022-09-06: qty 2

## 2022-09-06 MED ORDER — KETOROLAC TROMETHAMINE 15 MG/ML IJ SOLN
15.0000 mg | Freq: Once | INTRAMUSCULAR | Status: AC
  Administered 2022-09-06: 15 mg via INTRAVENOUS
  Filled 2022-09-06: qty 1

## 2022-09-06 MED ORDER — FENTANYL CITRATE PF 50 MCG/ML IJ SOSY
50.0000 ug | PREFILLED_SYRINGE | Freq: Once | INTRAMUSCULAR | Status: AC
  Administered 2022-09-06: 50 ug via INTRAVENOUS
  Filled 2022-09-06: qty 1

## 2022-09-06 MED ORDER — PREDNISONE 10 MG PO TABS: 40.0000 mg | ORAL_TABLET | Freq: Every day | ORAL | 0 refills | Status: AC

## 2022-09-06 MED ORDER — OXYCODONE HCL 5 MG PO TABS
5.0000 mg | ORAL_TABLET | Freq: Once | ORAL | Status: AC
  Administered 2022-09-06: 5 mg via ORAL
  Filled 2022-09-06: qty 1

## 2022-09-06 MED ORDER — NAPROXEN 500 MG PO TABS: 500.0000 mg | ORAL_TABLET | Freq: Two times a day (BID) | ORAL | 0 refills | Status: DC

## 2022-09-06 NOTE — ED Notes (Signed)
Patient transported to MRI 

## 2022-09-06 NOTE — Discharge Instructions (Addendum)
You were seen in the emergency today for your back pain.  While in the emergency department completed a full MRI which showed no evidence of acute fracture or spinal cord impingement however it does show that you have slightly bulging disks.  Please see the above exercises you may begin doing these 3 times daily please refrain from maintaining total bedrest as this will tighten your back and make your back pain worse.  If you notice worsening symptoms especially with inability to urinate, numbness in your legs or your groin region please return the emergency department as soon as possible. Sincerely, Clydie Braun, PGY-2

## 2022-09-06 NOTE — ED Provider Notes (Signed)
[facility] Provider Note   CSN: [id number] Arrival date & time: 09/06/22  1242     History  Chief Complaint  Patient presents with   Weakness    Denise Moses is a 42 y.o. female.  Denise Moses is a 42 year old female past medical history of documented MS and follows with [facility] neurolog[facility].  She is not currently on any biologic medications for her MS.  She recently presented to the emergency department on 01/24 for what appeared to be an MS flare with pain in her bilateral lower extremities she was therefore placed on a steroid taper and recently completed the taper the day before yesterday.  States that her pain is a spasm with a pulling sensation over the last 2 weeks in her left lower extremity.  She states that it is worse with ambulation.  She states the pain starts in her back runs down her buttock down the posterior portion of her left leg and down to her foot.  Patient works at [employer] she denies any acute trauma to her lower back.  She denies any fevers or chills she denies any urinary retention or fecal incontinence.  She denies any paresthesias or numbness to her groin area.  She has no prior history of IV drug use.    Weakness      Home Medications Prior to Admission medications   Medication Sig Start Date End Date Taking? Authorizing Provider  diclofenac (FLECTOR) 1.3 % PTCH Place 1 patch onto the skin 2 (two) times daily. 09/06/22  Yes Donzetta Matters, MD  naproxen (NAPROSYN) 500 MG tablet Take 1 tablet (500 mg total) by mouth 2 (two) times daily with a meal. 09/06/22  Yes Donzetta Matters, MD  predniSONE (DELTASONE) 10 MG tablet Take 4 tablets (40 mg total) by mouth daily for 4 days. 09/06/22 09/10/22 Yes Donzetta Matters, MD  amoxicillin (AMOXIL) 500 MG capsule Take 500 mg by mouth 3 (three) times daily.    [provider]  cephALEXin (KEFLEX) 500 MG capsule Take 1 capsule (500 mg total) by mouth 4 (four) times  daily. Patient not taking: Reported on 08/23/2022 04/23/22   Marney Setting, NP  Ferrous Sulfate (IRON PO) Take by mouth. Patient not taking: Reported on 04/28/2021    [provider]  fluconazole (DIFLUCAN) 150 MG tablet Take 1 tablet (150 mg total) by mouth daily. 08/26/22   Fenton Foy, NP  gabapentin (NEURONTIN) 300 MG capsule Take 1 capsule (300 mg total) by mouth 3 (three) times daily. Patient not taking: Reported on 04/28/2021 09/11/20   [name], MD  Multiple Vitamin (MULTIVITAMIN ADULT PO) Take by mouth. Patient not taking: Reported on 04/28/2021    [provider]  NIFEdipine (PROCARDIA XL/NIFEDICAL-XL) 90 MG 24 hr tablet Take 1 tablet (90 mg total) by mouth daily. Patient not taking: Reported on 04/28/2021 05/08/20 05/08/21  Vevelyn Francois, NP  predniSONE (DELTASONE) 10 MG tablet Take 4 tabs for 2 days, then 3 tabs for 2 days, then 2 tabs for 2 days, then 1 tab for 2 days, then stop 08/26/22   Fenton Foy, NP  TECFIDERA 240 MG CPDR Take 240 mg by mouth 2 (two) times daily. Patient not taking: Reported on 04/28/2021    [provider]  Vitamin D, Ergocalciferol, (DRISDOL) 1.25 MG (50000 UNIT) CAPS capsule Take 1 capsule (50,000 Units total) by mouth every 7 (seven) days. Patient not taking: Reported on 04/28/2021 09/15/20  [name], MD  Zoster Vaccine Adjuvanted [facility]) injection Please complete the shingrix according to protocol prior to Ocrelizumab infusion Patient not taking: Reported on 04/28/2021 09/11/20   [name], MD      Allergies    Patient has no known allergies.    Review of Systems   Review of Systems  Neurological:  Positive for weakness.    Physical Exam Updated Vital Signs BP (!) 163/97 (BP Location: Right Arm)   Pulse 63   Temp (!) 97.4 F (36.3 C) (Oral)   Resp 18   Ht '5\' 6"'$  (1.676 m)   Wt 86.2 kg   LMP 08/20/2022   SpO2 100%   BMI 30.67 kg/m  Physical Exam Vitals and nursing note reviewed.  Constitutional:       General: She is not in acute distress.    Appearance: She is well-developed.  HENT:     Head: Normocephalic and atraumatic.  Eyes:     Conjunctiva/sclera: Conjunctivae normal.  Cardiovascular:     Rate and Rhythm: Normal rate and regular rhythm.     Heart sounds: No murmur heard. Pulmonary:     Effort: Pulmonary effort is normal. No respiratory distress.     Breath sounds: Normal breath sounds.  Abdominal:     Palpations: Abdomen is soft.     Tenderness: There is no abdominal tenderness.  Musculoskeletal:        General: No swelling.     Cervical back: Neck supple.  Skin:    General: Skin is warm and dry.     Capillary Refill: Capillary refill takes less than 2 seconds.  Neurological:     Mental Status: She is alert.     GCS: GCS eye subscore is 4. GCS verbal subscore is 5. GCS motor subscore is 6.     Cranial Nerves: Cranial nerves 2-12 are intact.     Sensory: Sensation is intact.     Motor: Motor function is intact.     Coordination: Coordination is intact.     Gait: Gait is intact.     Comments: Left sided straight leg raise positive  Cross straight leg raise positive with R straight leg raise  Psychiatric:        Mood and Affect: Mood normal.     ED Results / Procedures / Treatments   Labs (all labs ordered are listed, but only abnormal results are displayed) Labs Reviewed  CBC - Abnormal; Notable for the following components:      Result Value   WBC 10.8 (*)    Hemoglobin 9.9 (*)    HCT 31.6 (*)    MCV 74.9 (*)    MCH 23.5 (*)    RDW 19.7 (*)    Platelets 427 (*)    All other components within normal limits  DIFFERENTIAL - Abnormal; Notable for the following components:   Neutro Abs 9.4 (*)    All other components within normal limits  COMPREHENSIVE METABOLIC PANEL - Abnormal; Notable for the following components:   Sodium 134 (*)    CO2 21 (*)    Total Bilirubin 0.2 (*)    All other components within normal limits  URINALYSIS, ROUTINE W REFLEX  MICROSCOPIC - Abnormal; Notable for the following components:   APPearance TURBID (*)    Leukocytes,Ua MODERATE (*)    All other components within normal limits  PROTIME-INR  APTT  POC URINE PREG, ED    EKG None  Radiology MR Lumbar Spine W Wo Contrast  Result Date: 09/06/2022 CLINICAL DATA:  Initial evaluation for acute myelopathy. EXAM: MRI LUMBAR SPINE WITHOUT AND WITH CONTRAST TECHNIQUE: Multiplanar and multiecho pulse sequences of the lumbar spine were obtained without and with intravenous contrast. CONTRAST:  8.17m GADAVIST GADOBUTROL 1 MMOL/ML IV SOLN COMPARISON:  None Available. FINDINGS: Segmentation: Standard. Lowest well-formed disc space labeled the L5-S1 level. Alignment: Physiologic with preservation of the normal lumbar lordosis. No listhesis. Vertebrae: Vertebral body height maintained without acute or chronic fracture. Bone marrow signal intensity within normal limits. Few scattered benign hemangiomata noted. No worrisome osseous lesions. No abnormal marrow edema or enhancement. Conus medullaris and cauda equina: Conus extends to the L1 level. Conus and cauda equina appear normal. Paraspinal and other soft tissues: Paraspinous soft tissues within normal limits. Subcentimeter simple T2 hyperintense cyst noted within the left kidney, benign in appearance, no follow-up imaging recommended. Disc levels: L1-2: Normal interspace. Mild bilateral facet hypertrophy. No canal or foraminal stenosis. L2-3: Normal interspace. Mild bilateral facet hypertrophy. No canal or foraminal stenosis. L3-4: Disc desiccation with mild disc bulge. Superimposed left foraminal to extraforaminal disc protrusion contacts the exiting left L3 nerve root (series 22, image 25). Mild bilateral facet hypertrophy. Borderline mild narrowing of the left lateral recess without significant spinal stenosis. Moderate left L3 foraminal narrowing. Right neural foramen remains patent. L4-5: Minimal disc bulge. Mild facet  hypertrophy. No spinal stenosis. Foramina remain patent. L5-S1: Minimal disc bulge. Mild facet hypertrophy. No spinal stenosis. Foramina remain patent. IMPRESSION: 1. Normal MRI appearance of the conus medullaris and nerve roots of the cauda equina. No evidence for cord compression. 2. Left foraminal to extraforaminal disc protrusion at L3-4, contacting the exiting left L3 nerve root. 3. Additional minimal noncompressive disc bulging at L4-5 and L5-S1 without stenosis or impingement. 4. Mild bilateral facet hypertrophy throughout the lumbar spine. Electronically Signed   By: BJeannine BogaM.D.   On: 09/06/2022 19:56   MR THORACIC SPINE W WO CONTRAST  Result Date: 09/06/2022 CLINICAL DATA:  Acute thoracic myelopathy. History multiple sclerosis. EXAM: MRI THORACIC WITHOUT AND WITH CONTRAST TECHNIQUE: Multiplanar and multiecho pulse sequences of the thoracic spine were obtained without and with intravenous contrast. CONTRAST:  8.512mGADAVIST GADOBUTROL 1 MMOL/ML IV SOLN COMPARISON:  No prior thoracic imaging. FINDINGS: Alignment:  Normal Vertebrae: Few scattered benign hemangiomas, not clinically significant. Cord: No thoracic cord lesion seen. No convincing demonstration of demyelination in the thoracic region. No abnormal cord enhancement. Paraspinal and other soft tissues: Normal Disc levels: No significant degenerative change. Minimal, non-compressive disc bulges from T7-8 through T11-12. Canal and foramina widely patent. IMPRESSION: 1. No evidence of demyelination in the thoracic region. 2. Minimal, non-compressive disc bulges from T7-8 through T11-12. Electronically Signed   By: MaNelson Chimes.D.   On: 09/06/2022 19:45   MR Brain W and Wo Contrast  Result Date: 09/06/2022 CLINICAL DATA:  Multiple sclerosis. EXAM: MRI HEAD WITHOUT AND WITH CONTRAST TECHNIQUE: Multiplanar, multiecho pulse sequences of the brain and surrounding structures were obtained without and with intravenous contrast. CONTRAST:   8.60m61mADAVIST GADOBUTROL 1 MMOL/ML IV SOLN COMPARISON:  10/02/2020.  04/29/2019. FINDINGS: Brain: Diffusion imaging does not show any convincing true restricted diffusion. Few small areas of T2 shine through. Mild chronic changes of demyelination noted affecting the brainstem and cerebellum, most notable within the medial aspect of the middle cerebellar peduncle on the right. There is progressive disease affecting the cerebral hemispheres. New foci of T2 and FLAIR signal are seen within the white matter of the right  temporal lobe, adjacent to the superior margin of the body of the right lateral ventricle, and within the deep white matter of the left parietal and occipital lobe. New lesions are slightly more conspicuous on the sagittal cube FLAIR imaging, more so than the axial standard FLAIR imaging. After contrast administration, no lesions show abnormal enhancement. No evidence of mass, hydrocephalus or extra-axial collection. Vascular: Flow Skull and upper cervical spine: Negative Sinuses/Orbits: Clear/normal Other: None IMPRESSION: Progressive changes of demyelination affecting the brain as outlined above, when compared to the most recent study of 10/02/2020. No lesions show restricted diffusion or contrast enhancement however. New lesions are seen in the right temporal lobe, adjacent to the superior margin of the body of the right lateral ventricle, and within the deep white matter of the left parietal and occipital lobe. New lesions are slightly more conspicuous on the sagittal cube FLAIR imaging, more so than the axial standard FLAIR imaging. Electronically Signed   By: Nelson Chimes M.D.   On: 09/06/2022 19:41   CT HEAD WO CONTRAST  Result Date: 09/06/2022 CLINICAL DATA:  Neuro deficit, stroke suspected. EXAM: CT HEAD WITHOUT CONTRAST TECHNIQUE: Contiguous axial images were obtained from the base of the skull through the vertex without intravenous contrast. RADIATION DOSE REDUCTION: This exam was  performed according to the departmental dose-optimization program which includes automated exposure control, adjustment of the mA and/or kV according to patient size and/or use of iterative reconstruction technique. COMPARISON:  Brain MRI dated 10/02/2020. FINDINGS: Brain: Ventricles are within normal limits in size and configuration. There is no mass, hemorrhage, edema or other evidence of acute parenchymal abnormality. No extra-axial hemorrhage. Vascular: No hyperdense vessel or unexpected calcification. Skull: Normal. Negative for fracture or focal lesion. Sinuses/Orbits: No acute finding. Other: None. IMPRESSION: Negative head CT. No intracranial mass, hemorrhage or edema. Electronically Signed   By: Franki Cabot M.D.   On: 09/06/2022 14:48    Procedures Procedures    Medications Ordered in ED Medications  HYDROcodone-acetaminophen (NORCO/VICODIN) 5-325 MG per tablet 2 tablet (2 tablets Oral Given 09/06/22 1322)  oxyCODONE (Oxy IR/ROXICODONE) immediate release tablet 5 mg (5 mg Oral Given 09/06/22 1648)  ketorolac (TORADOL) 15 MG/ML injection 15 mg (15 mg Intravenous Given 09/06/22 1648)  gadobutrol (GADAVIST) 1 MMOL/ML injection 8.5 mL (8.5 mLs Intravenous Contrast Given 09/06/22 1920)  gadobutrol (GADAVIST) 1 MMOL/ML injection 8.5 mL (8.5 mLs Intravenous Contrast Given 09/06/22 1924)  gadobutrol (GADAVIST) 1 MMOL/ML injection 8.5 mL (8.5 mLs Intravenous Contrast Given 09/06/22 1927)  fentaNYL (SUBLIMAZE) injection 50 mcg (50 mcg Intravenous Given 09/06/22 2200)    ED Course/ Medical Decision Making/ A&P                             Medical Decision Making Denise Moses is a 42 year old female past medical history as documented above presenting to the emergency department with left lower extremity pain.  On arrival to the emergency department she is tensive blood pressure 129/87, temperature 98.9 pulse 8615 respirations a minute satting 100% on room air.  Her history of MS this was initially  on the differential although on my physical exam with the patient I believe that her pain is more sciatic in nature given the distribution of the pain with the radiation from her buttock down to her foot especially with left-sided straight leg raise and especially since she is craw straight leg raise positive I feel that it is most likely sciatica.  I do not believe the patient is suffering from cauda equina as she has no urinary retention, no fecal incontinence no saddle anesthesias.  Patient has no evidence of acute transverse myelitis or other central nervous system abnormalities the reflex in her left lower extremity is grossly normal patient has no prior history of recent back procedure IV drug use etc. and she does not appear acutely septic at this time.  We obtained labs all of which were grossly within normal limits.  CT head was obtained in triage showing no acute abnormalities I did opt to obtain a MRI of her T and L-spine as well as MRI brain.  I wonder rule out acute spinal compression or disc protrusion onto the nerve root.  Patient's MRI brain shows recurrent signs of her known MS with no acute advancement of disease patient's thoracic spine shows no evidence of acute pathology lumbar spine shows no evidence of acute pathology however there is various levels of disc bulging which could correlate with her symptoms.  No evidence of cauda equina on MRI.  I treated patient's pain initially in the emergency department with [place] and [place].  I also gave her a dose of Toradol.  On returning from MRI patient was able to ambulate without difficulty I gave her 1 more dose of IV fentanyl in the emergency department we had a long discussion about back pain management including that she should no longer have 100% bedrest but begin performing back stretching exercises I gave her the phone number for physical therapy that she can call and begin being seen.  I also wrote her prescription for diclofenac gel  as well as naproxen that she can begin taking twice daily.  Patient was in agreement with this plan and discharged home to self-care.   Amount and/or Complexity of Data Reviewed Radiology: ordered.  Risk Prescription drug management.           Final Clinical Impression(s) / ED Diagnoses Final diagnoses:  Sciatica of left side    Rx / DC Orders ED Discharge Orders          Ordered    naproxen (NAPROSYN) 500 MG tablet  2 times daily with meals        09/06/22 2151    diclofenac (FLECTOR) 1.3 % PTCH  2 times daily        09/06/22 2151    predniSONE (DELTASONE) 10 MG tablet  Daily        09/06/22 2151              Donzetta Matters, MD 09/06/22 Minus Breeding    Carmin Muskrat, MD 09/07/22 [id number]

## 2022-09-06 NOTE — ED Triage Notes (Signed)
Pt arrived POV from home stating she is having a flare-up of her MS. Pt states for 2 weeks now she has not been able to walk and has had pain all over. Pt states she went to urgent care and her PCP. Pt was given steroids and a shot at urgent care. Pt states she finished the steroids yesterday and still feels the same.

## 2022-09-06 NOTE — ED Provider Triage Note (Signed)
Emergency Medicine Provider Triage Evaluation Note  Denise Moses , a 42 y.o. female  was evaluated in triage.  Pt complains of leg weakness and inability to walk for the last 2 weeks.  Patient states that she is weak in the left leg and normally is ambulatory at baseline without assistance.  Reports that she was evaluated at urgent care for MS flareup and given Toradol shot and Medrol Dosepak and told to follow-up with her PCP which she did who changed her to a different steroid which she completed yesterday but has not had any improvement in her symptoms.  Occasional bilateral frontal headaches and multiple episodes of diarrhea over the last few days.  No fever, chills, chest pain, shortness of breath, vision changes, confusion, nausea, vomiting, abdominal pain or other symptoms.  States that her insurance denied the medication that her neurologist wanted her to be on daily so they are now in the process of trying to get another medication approved.  She denies being on any other pain medication.  States that the symptoms are atypical for her usual MS flareups.  Review of Systems  Positive: See HPI Negative: See HPI  Physical Exam  BP 129/87   Pulse 86   Temp 98.9 F (37.2 C) (Oral)   Resp 15   Ht '5\' 6"'$  (1.676 m)   Wt 86.2 kg   LMP 08/20/2022   SpO2 100%   BMI 30.67 kg/m  Gen:   Awake, mild distress secondary to pain Resp:  Normal effort  MSK:   Moves extremities without difficulty yes yes Other:  Alert and oriented, cranial nerves intact, 4+ out of 5 strength of the right lower extremity, 4/5 strength in the left lower extremity, 5/5 strength in bilateral upper extremities, normal speech, lower extremities are nontender to palpation, patient tearful stating she is in a lot of pain  Medical Decision Making  Medically screening exam initiated at 1:17 PM.  Appropriate orders placed.  Denise Moses was informed that the remainder of the evaluation will be completed by another  provider, this initial triage assessment does not replace that evaluation, and the importance of remaining in the ED until their evaluation is complete.     Suzzette Righter, PA-C 09/06/22 1321

## 2022-09-22 ENCOUNTER — Telehealth: Payer: Self-pay | Admitting: Licensed Clinical Social Worker

## 2022-09-22 NOTE — Transitions of Care (Post Inpatient/ED Visit) (Signed)
   09/22/2022  Name: Denise Moses MRN: [id number] DOB: 05-29-1981  Today's TOC FU Call Status: Today's TOC FU Call Status:: Unsuccessul Call (1st Attempt) Unsuccessful Call (1st Attempt) Date: 09/22/22  Transition Care Management Follow-up Telephone Call Date of Discharge: 09/06/22 [name] Type of Discharge: Emergency Department Reason for ED Visit: Orthopedic Conditions, Other: (PAIN IN BACK) How have you been since you were released from the hospital?: Same Any questions or concerns?: No  Items Reviewed: Did you receive and understand the discharge instructions provided?: Yes Medications obtained and verified?: Yes (Medications Reviewed) Any new allergies since your discharge?: No Dietary orders reviewed?: No Do you have support at home?: Yes  Home Care and Equipment/Supplies: [place]?: No Any new equipment or medical supplies ordered?: No  Functional Questionnaire: Do you need assistance with bathing/showering or dressing?: No Do you need assistance with meal preparation?: No Do you need assistance with eating?: No Do you have difficulty maintaining continence: No Do you need assistance with getting out of bed/getting out of a chair/moving?: No Do you have difficulty managing or taking your medications?: No  Folllow up appointments reviewed: PCP Follow-up appointment confirmed?: Yes Date of PCP follow-up appointment?: 11/25/22 Follow-up Provider: PCP [facility] Follow-up appointment confirmed?: Yes Date of Specialist follow-up appointment?: 11/04/22 Follow-Up Specialty Provider:: Dr Krista Blue Do you need transportation to your follow-up appointment?: No Do you understand care options if your condition(s) worsen?: Yes-patient verbalized understanding  SDOH Interventions Today    Flowsheet Row Most Recent Value  SDOH Interventions   Transportation Interventions Intervention Not Indicated      Eula Fried, BSW, MSW, [employer] Managed Medicaid [email or web address] Phone: [phone]

## 2022-11-01 ENCOUNTER — Encounter (HOSPITAL_COMMUNITY): Payer: Self-pay

## 2022-11-01 ENCOUNTER — Other Ambulatory Visit: Payer: Self-pay

## 2022-11-01 ENCOUNTER — Emergency Department (HOSPITAL_COMMUNITY)
Admission: EM | Admit: 2022-11-01 | Discharge: 2022-11-01 | Discharge status: Against Medical Advice | Payer: Medicaid Other | Attending: Emergency Medicine | Admitting: Emergency Medicine

## 2022-11-01 DIAGNOSIS — Z5321 Procedure and treatment not carried out due to patient leaving prior to being seen by health care provider: Secondary | ICD-10-CM | POA: Diagnosis not present

## 2022-11-01 DIAGNOSIS — L02412 Cutaneous abscess of left axilla: Secondary | ICD-10-CM | POA: Diagnosis not present

## 2022-11-01 MED ORDER — OXYCODONE-ACETAMINOPHEN 5-325 MG PO TABS
1.0000 | ORAL_TABLET | Freq: Once | ORAL | Status: AC
  Administered 2022-11-01: 1 via ORAL
  Filled 2022-11-01: qty 1

## 2022-11-01 NOTE — ED Provider Triage Note (Signed)
Emergency Medicine Provider Triage Evaluation Note  Denise Moses , a 42 y.o. female  was evaluated in triage.  Pt complains of abscess under her left arm for the last 2 days.  Patient endorses history of same.  She denies known history of hidradenitis suppurativa, reports that it is quite painful.  She did not drive to the emergency department today.  She denies any abscesses in other locations, systemic fever, chills.  Review of Systems  Positive: abscess Negative: Fever, chills  Physical Exam  BP 137/70 (BP Location: Right Arm)   Pulse 70   Temp 99.1 F (37.3 C) (Oral)   Resp 16   Ht 5\' 6"  (1.676 m)   Wt 86.2 kg   SpO2 100%   BMI 30.67 kg/m  Gen:   Awake, no distress   Resp:  Normal effort  MSK:   Moves extremities without difficulty  Other:  Abscess x 2 on left axilla with some surrounding skin erythema  Medical Decision Making  Medically screening exam initiated at 1:51 PM.  Appropriate orders placed.  Nizhoni Truong was informed that the remainder of the evaluation will be completed by another provider, this initial triage assessment does not replace that evaluation, and the importance of remaining in the ED until their evaluation is complete.  Workup initiated in triage    Anselmo Pickler, [place] 11/01/22 1352

## 2022-11-01 NOTE — ED Triage Notes (Signed)
Abscess under left axilla.  Reports recurrent abscess and this one started approx 2 days ago.

## 2022-11-01 NOTE — ED Notes (Signed)
Patient called for room with no response.  

## 2022-11-04 ENCOUNTER — Encounter: Payer: Self-pay | Admitting: Neurology

## 2022-11-04 ENCOUNTER — Ambulatory Visit: Payer: Medicaid Other | Admitting: Neurology

## 2022-11-04 ENCOUNTER — Telehealth: Payer: Self-pay | Admitting: Neurology

## 2022-11-04 ENCOUNTER — Telehealth: Payer: Self-pay | Admitting: *Deleted

## 2022-11-04 VITALS — BP 136/85 | HR 57 | Ht 66.0 in | Wt 190.0 lb

## 2022-11-04 DIAGNOSIS — R799 Abnormal finding of blood chemistry, unspecified: Secondary | ICD-10-CM | POA: Diagnosis not present

## 2022-11-04 DIAGNOSIS — G35 Multiple sclerosis: Secondary | ICD-10-CM | POA: Diagnosis not present

## 2022-11-04 DIAGNOSIS — R5383 Other fatigue: Secondary | ICD-10-CM

## 2022-11-04 DIAGNOSIS — R7989 Other specified abnormal findings of blood chemistry: Secondary | ICD-10-CM | POA: Diagnosis not present

## 2022-11-04 DIAGNOSIS — E538 Deficiency of other specified B group vitamins: Secondary | ICD-10-CM | POA: Diagnosis not present

## 2022-11-04 DIAGNOSIS — R3915 Urgency of urination: Secondary | ICD-10-CM

## 2022-11-04 DIAGNOSIS — Z1329 Encounter for screening for other suspected endocrine disorder: Secondary | ICD-10-CM | POA: Diagnosis not present

## 2022-11-04 DIAGNOSIS — R7982 Elevated C-reactive protein (CRP): Secondary | ICD-10-CM | POA: Diagnosis not present

## 2022-11-04 DIAGNOSIS — D649 Anemia, unspecified: Secondary | ICD-10-CM

## 2022-11-04 DIAGNOSIS — Z79899 Other long term (current) drug therapy: Secondary | ICD-10-CM | POA: Diagnosis not present

## 2022-11-04 DIAGNOSIS — R700 Elevated erythrocyte sedimentation rate: Secondary | ICD-10-CM | POA: Diagnosis not present

## 2022-11-04 DIAGNOSIS — E079 Disorder of thyroid, unspecified: Secondary | ICD-10-CM | POA: Diagnosis not present

## 2022-11-04 DIAGNOSIS — R269 Unspecified abnormalities of gait and mobility: Secondary | ICD-10-CM | POA: Diagnosis not present

## 2022-11-04 MED ORDER — DULOXETINE HCL 60 MG PO CPEP
60.0000 mg | ORAL_CAPSULE | Freq: Every day | ORAL | 3 refills | Status: DC
Start: 2022-11-04 — End: 2024-02-16

## 2022-11-04 MED ORDER — OXYBUTYNIN CHLORIDE ER 10 MG PO TB24: 10.0000 mg | ORAL_TABLET | Freq: Every day | ORAL | 11 refills | Status: DC

## 2022-11-04 MED ORDER — DULOXETINE HCL 30 MG PO CPEP
30.0000 mg | ORAL_CAPSULE | Freq: Every day | ORAL | 0 refills | Status: DC
Start: 2022-11-04 — End: 2023-05-11

## 2022-11-04 NOTE — Telephone Encounter (Signed)
[employer] medicaid Josem [id number] exp. 11/04/22-12/19/22 sent to [facility] [id number]

## 2022-11-04 NOTE — Progress Notes (Addendum)
Chief Complaint  Patient presents with   Follow-up    Rm 14 Right sided pain, ms fu      ASSESSMENT AND PLAN  Denise Moses is a 42 y.o. female Relapsing Remitting multiple sclerosis   Gait abnormality  Previous MRIs showed evidence of cervical involvement Discussed with patient, decided on Ocrelizumab Lab evaluation MRI cervical w/wo Cymbalta titrating to 60mg  for body achy pain, depression/anxiety   insurance will not cover ocrelizumab, switch to Briumvi according to the protocol.  DIAGNOSTIC DATA (LABS, IMAGING, TESTING) - I reviewed patient records, labs, notes, testing and imaging myself where available. JC virus antibody was positive with titer 2.96 on November 04, 2022  MEDICAL HISTORY:  Denise Moses is 42 year old female, seen in request by orthopedic surgeon Dr. Margarita Rana for evaluation of possible multiple sclerosis, her primary care physician is PA Arrie Senate, she is accompanied by her significant others at today's clinic visit on Sept 20, 2020   I have reviewed and summarized the referring note from the referring physician.  She is currently [redacted] weeks pregnant, per patient, she is going to have a elective abortion,   In July 2020, she had a sudden onset neck pain, radiating pain to bilateral shoulder, bilateral fingertips mainly 2, 3, fourth fingertips, also developed mild weak grip, at the peak of her difficulty, she also described difficulty walking, urinary urgency, incontinence, she was given a prescription of steroid, and gabapentin, which has helped her symptoms some, but still not back to her baseline, she still has bilateral upper extremity paresthesia, no longer have significant gait abnormality, lower extremity paresthesia, she denies visual loss.   I personally reviewed MRI of cervical spine on February 27, 2019, signal abnormality within the cord at C2,   UPDATE Sept 30 2020: She is scheduled for abortion on May 04, 2019, there  is no new neurological symptoms,   We personally reviewed MRI of the brain without contrast April 29, 2019, multiple T2/flair hyperintensity in the pons, hemisphere, consistent with relapsing remitting multiple sclerosis,   Laboratory evaluation on April 12, 2019 showed decreased vitamin D 14.4, normal or negative angiotensin-converting enzyme level, ANA, 10, iron level, copper level was mildly elevated 225, hepatitis panel, A1c was 5.5, normal electrophoresis CPK was 24, ESR was 112, normal TSH, C-reactive protein was mildly elevated 11, normal folic acid, HIV, RPR, B12,   UPDATE Jun 12 2019: She had a elective abortion C-section on May 04, 2019, we also called her primary care physician, she has lost the follow-up since 2018, missed multiple scheduled visit, was actually discharged by her primary care physician   She complains of worsening bilateral lower extremity swelling, burning pain, denies significant gait abnormality   We had extensive discussion with patient, and her significant others, she preferred to started ocrelizumab, emphasized with patient the importance to stay on contraceptives, will refer her to OB/GYN for options.   UPDATE Sep 11 2020: She was pregnant again, never started Ocrelizumab as scheduled. She had a C-section with healthy baby on March 23 2021, she is no longer breast-feeding, she , does complains of intermittent blurry vision, seems to noticed more difficulty walking post pattern, she denies falling bladder incontinence, she is now ready to receive treatment, had tubal ligation during C-section   We again personally reviewed MRI of the brain without contrast September 2020, multiple T2/flair hyperintensity foci in the pons, hemisphere, also with C2 involvement   UPDATE October 09 2020: She started on Tecfidera since  Feb 2022. She complains of intermittent right-sided throbbing pain, gabapentin 300 mg 3 times daily works well, mild unsteady gait,  continue have urinary urgency, but much improved   She is tolerating Tecfidera, with some flash, denies significant GI side effect, insurance will only pay ocrelizumab as second tier,   We personally reviewed MRI of brain, cervical with without contrast in March 2022, compared to previous scan, multiple T2/FLAIR hyperintensity foci in cerebral hemisphere, consistent with chronic demyelinating plaque, compared to previous scan in 2020, there was 2-3 new supratentorium lesions, no contrast-enhancement.  MRI of cervical spine showed lesion within the central posterior spinal cord adjacent to C2, no significant canal foraminal narrowing,   UPDATE November 04 2022: She lost follow up since 2022, stop Tecfidera since 2022.  She is accompanied by her daughter at today's clinical visit, She has relapse in Feb 2024, she has trouble walking, urinary and bowel incontinence, did not do well for one month, could not walk,  multiple ER visit, was given solumedrol and  prednisone tapering, now has some improvement, ambulate with cane.  This flareup has really alarmed her, she wants to continue her neurological care and treatments   Personally reviewed MRI of brain w/wo in Feb 2024. Progressive changes of demyelination affecting the brain as outlined above, when compared to the most recent study of 10/02/2020. No lesions show restricted diffusion or contrast enhancement however. New lesions are seen in the right temporal lobe, adjacent to the superior margin of the body of the right lateral ventricle, and within the deep white matter of the left parietal and occipital lobe. New lesions are slightly more conspicuous on the sagittal cube FLAIR imaging, more so than the axial standard FLAIR imaging.   MRI thoracic spine w/wo 1. No evidence of demyelination in the thoracic region. 2. Minimal, non-compressive disc bulges from T7-8 through T11-12.   MRI of lumbar in Feb 2024 1. Normal MRI appearance of the conus  medullaris and nerve roots of the cauda equina. No evidence for cord compression. 2. Left foraminal to extraforaminal disc protrusion at L3-4, contacting the exiting left L3 nerve root. 3. Additional minimal noncompressive disc bulging at L4-5 and L5-S1 without stenosis or impingement. 4. Mild bilateral facet hypertrophy throughout the lumbar spine.  PHYSICAL EXAM:   Vitals:   11/04/22 1046  BP: 136/85  Pulse: (!) 57  Weight: 190 lb (86.2 kg)  Height: 5\' 6"  (1.676 m)     Body mass index is 30.67 kg/m.  PHYSICAL EXAMNIATION:  Gen: NAD, conversant, well nourised, well groomed                     Cardiovascular: Regular rate rhythm, no peripheral edema, warm, nontender. Eyes: Conjunctivae clear without exudates or hemorrhage Neck: Supple, no carotid bruits. Pulmonary: Clear to auscultation bilaterally   NEUROLOGICAL EXAM:  MENTAL STATUS: Speech/cognition: Awake, alert, oriented to history taking and casual conversation CRANIAL NERVES: CN II: Visual fields are full to confrontation. Pupils are round equal and briskly reactive to light. CN III, IV, VI: extraocular movement are normal. No ptosis. CN V: Facial sensation is intact to light touch CN VII: Face is symmetric with normal eye closure  CN VIII: Hearing is normal to causal conversation. CN IX, X: Phonation is normal. CN XI: Head turning and shoulder shrug are intact  MOTOR: There is no pronator drift of out-stretched arms. Muscle bulk and tone are normal. Muscle strength is normal.  REFLEXES: Reflexes are 2+ and symmetric at the  biceps, triceps, knees, and ankles. Plantar responses are flexor.  SENSORY: Intact to light touch, pinprick and vibratory sensation are intact in fingers and toes.  COORDINATION: There is no trunk or limb dysmetria noted.  GAIT/STANCE: need to push up to get up from seated position, stiff, unsteady gait.  REVIEW OF SYSTEMS:  Full 14 system review of systems performed and notable  only for as above All other review of systems were negative.   ALLERGIES: No Known Allergies  HOME MEDICATIONS: Current Outpatient Medications  Medication Sig Dispense Refill   naproxen (NAPROSYN) 500 MG tablet Take 500 mg by mouth 2 (two) times daily with a meal.     predniSONE (DELTASONE) 10 MG tablet Take 10 mg by mouth daily with breakfast.     NIFEdipine (PROCARDIA XL/NIFEDICAL-XL) 90 MG 24 hr tablet Take 1 tablet (90 mg total) by mouth daily. (Patient not taking: Reported on 04/28/2021) 90 tablet 3   No current facility-administered medications for this visit.    PAST MEDICAL HISTORY: Past Medical History:  Diagnosis Date   Abnormal MRI, cervical spine    Anemia    Elective abortion    Heart murmur    Herpes    Infection    UTI   Multiple sclerosis    Pregnancy induced hypertension    2001   Vitamin D deficiency     PAST SURGICAL HISTORY: Past Surgical History:  Procedure Laterality Date   CESAREAN SECTION N/A 03/23/2020   Procedure: CESAREAN SECTION;  Surgeon: Tilda Burrow, MD;  Location: [facility];  Service: Obstetrics;  Laterality: N/A;  Arrest of Dilation   CHOLECYSTECTOMY     LAPAROSCOPY N/A 06/09/2016   Procedure: LAPAROSCOPY OPERATIVE;  Surgeon: Tereso Newcomer, MD;  Location: [facility];  Service: Gynecology;  Laterality: N/A;  ectopic    THERAPEUTIC ABORTION     x 4   TUBAL LIGATION     UNILATERAL SALPINGECTOMY Left 06/09/2016   Procedure: UNILATERAL SALPINGECTOMY, REMOVAL OF IUD;  Surgeon: Tereso Newcomer, MD;  Location: [facility];  Service: Gynecology;  Laterality: Left;   WISDOM TOOTH EXTRACTION      FAMILY HISTORY: Family History  Problem Relation Age of Onset   Breast cancer Mother 37       was a smoker, alcohol drinker in early life    Breast cancer Maternal Aunt    Cancer Maternal Grandfather        unsure of type   Other Father        unsure of history   Breast cancer Maternal Aunt     SOCIAL HISTORY: Social History   Socioeconomic  History   Marital status: Single    Spouse name: Not on file   Number of children: 6   Years of education: some college   Highest education level: Not on file  Occupational History   Occupation: Unemployed  Tobacco Use   Smoking status: Former    Packs/day: 0.25    Years: 10.00    Additional pack years: 0.00    Total pack years: 2.50    Types: Cigarettes    Quit date: 03/2019    Years since quitting: 3.6   Smokeless tobacco: Never   Tobacco comments:    2-3 cigs/day  Vaping Use   Vaping Use: Never used  Substance and Sexual Activity   Alcohol use: Not Currently    Comment: social drinker   Drug use: Yes    Frequency: 5.0 times per week  Types: Marijuana    Comment: last used before delivery   Sexual activity: Not Currently    Partners: Male    Birth control/protection: Surgical  Other Topics Concern   Not on file  Social History Narrative   Lives at home with children.   Right-handed.   No daily caffeine use.   Social Determinants of Health   Financial Resource Strain: Low Risk  (04/08/2020)   Overall Financial Resource Strain (CARDIA)    Difficulty of Paying Living Expenses: Not hard at all  Food Insecurity: Food Insecurity Present (05/20/2020)   Hunger Vital Sign    Worried About Running Out of Food in the Last Year: Sometimes true    Ran Out of Food in the Last Year: Sometimes true  Transportation Needs: No Transportation Needs (09/22/2022)   PRAPARE - Administrator, Civil Service (Medical): No    Lack of Transportation (Non-Medical): No  Physical Activity: Insufficiently Active (05/20/2020)   Exercise Vital Sign    Days of Exercise per Week: 2 days    Minutes of Exercise per Session: 30 min  Stress: Stress Concern Present (04/08/2020)   [employer] of Occupational Health - Occupational Stress Questionnaire    Feeling of Stress : To some extent  Social Connections: Moderately Isolated (04/08/2020)   Social Connection and Isolation Panel  [NHANES]    Frequency of Communication with Friends and Family: More than three times a week    Frequency of Social Gatherings with Friends and Family: More than three times a week    Attends Religious Services: More than 4 times per year    Active Member of [employer] or Organizations: No    Attends Banker Meetings: Never    Marital Status: Never married  Intimate Partner Violence: Not At Risk (04/08/2020)   Humiliation, Afraid, Rape, and Kick questionnaire    Fear of Current or Ex-Partner: No    Emotionally Abused: No    Physically Abused: No    Sexually Abused: No      Levert Feinstein, M.D. Ph.D.  [facility] [address] [place], [place] [address] Ph: [phone] Fax: [phone]  CC:  Kathrynn Speed, NP [address] [place],  [place] [address]  Ivonne Andrew, NP    Total time spent reviewing the chart, obtaining history, examined patient, ordering tests, documentation, consultations and family, care coordination was  60 minutes

## 2022-11-04 NOTE — Telephone Encounter (Signed)
Placed [facility] lab in [facility] lock box for routine lab pick up. Results pending. 

## 2022-11-05 LAB — URINALYSIS, ROUTINE W REFLEX MICROSCOPIC
Bilirubin, UA: NEGATIVE
Glucose, UA: NEGATIVE
Ketones, UA: NEGATIVE
Nitrite, UA: NEGATIVE
Protein,UA: NEGATIVE
RBC, UA: NEGATIVE
Specific Gravity, UA: 1.02 (ref 1.005–1.030)
Urobilinogen, Ur: 0.2 mg/dL (ref 0.2–1.0)
pH, UA: 6.5 (ref 5.0–7.5)

## 2022-11-05 LAB — MICROSCOPIC EXAMINATION
Casts: NONE SEEN /lpf
Epithelial Cells (non renal): >10 /hpf — AB (ref 0–10)
RBC, Urine: NONE SEEN /hpf (ref 0–2)

## 2022-11-06 LAB — URINE CULTURE

## 2022-11-08 ENCOUNTER — Telehealth: Payer: Self-pay

## 2022-11-08 NOTE — Telephone Encounter (Signed)
Ocrevus start form faxed to [facility].

## 2022-11-09 ENCOUNTER — Encounter: Payer: Self-pay | Admitting: Neurology

## 2022-11-11 LAB — AMB RESULTS CONSOLE CBG: Glucose: 105

## 2022-11-11 NOTE — Progress Notes (Signed)
Patient currently has no SDOH needs.

## 2022-11-12 ENCOUNTER — Telehealth: Payer: Self-pay | Admitting: Neurology

## 2022-11-12 ENCOUNTER — Ambulatory Visit: Payer: Medicaid Other | Attending: Neurology

## 2022-11-12 DIAGNOSIS — R2689 Other abnormalities of gait and mobility: Secondary | ICD-10-CM | POA: Insufficient documentation

## 2022-11-12 DIAGNOSIS — M6281 Muscle weakness (generalized): Secondary | ICD-10-CM | POA: Insufficient documentation

## 2022-11-12 DIAGNOSIS — R2681 Unsteadiness on feet: Secondary | ICD-10-CM | POA: Insufficient documentation

## 2022-11-12 LAB — CBC WITH DIFFERENTIAL
Basophils Absolute: 0.1 10*3/uL (ref 0.0–0.2)
Basos: 2 %
EOS (ABSOLUTE): 0.1 10*3/uL (ref 0.0–0.4)
Eos: 2 %
Hematocrit: 31 % — ABNORMAL LOW (ref 34.0–46.6)
Hemoglobin: 9.1 g/dL — ABNORMAL LOW (ref 11.1–15.9)
Immature Grans (Abs): 0 10*3/uL (ref 0.0–0.1)
Immature Granulocytes: 0 %
Lymphocytes Absolute: 2.1 10*3/uL (ref 0.7–3.1)
Lymphs: 34 %
MCH: 22.6 pg — ABNORMAL LOW (ref 26.6–33.0)
MCHC: 29.4 g/dL — ABNORMAL LOW (ref 31.5–35.7)
MCV: 77 fL — ABNORMAL LOW (ref 79–97)
Monocytes Absolute: 0.4 10*3/uL (ref 0.1–0.9)
Monocytes: 6 %
Neutrophils Absolute: 3.4 10*3/uL (ref 1.4–7.0)
Neutrophils: 56 %
RBC: 4.02 x10E6/uL (ref 3.77–5.28)
RDW: 17.4 % — ABNORMAL HIGH (ref 11.7–15.4)
WBC: 6.2 10*3/uL (ref 3.4–10.8)

## 2022-11-12 LAB — QUANTIFERON-TB GOLD PLUS
QuantiFERON Mitogen Value: >10 IU/mL
QuantiFERON Nil Value: 0.16 IU/mL
QuantiFERON TB1 Ag Value: 0.28 IU/mL
QuantiFERON TB2 Ag Value: 0.45 IU/mL
QuantiFERON-TB Gold Plus: NEGATIVE

## 2022-11-12 LAB — COMPREHENSIVE METABOLIC PANEL
ALT: 13 IU/L (ref 0–32)
AST: 11 IU/L (ref 0–40)
Albumin/Globulin Ratio: 1.2 (ref 1.2–2.2)
Albumin: 4.3 g/dL (ref 3.9–4.9)
Alkaline Phosphatase: 75 IU/L (ref 44–121)
BUN/Creatinine Ratio: 11 (ref 9–23)
BUN: 7 mg/dL (ref 6–24)
Bilirubin Total: 0.3 mg/dL (ref 0.0–1.2)
CO2: 23 mmol/L (ref 20–29)
Calcium: 9.5 mg/dL (ref 8.7–10.2)
Chloride: 103 mmol/L (ref 96–106)
Creatinine, Ser: 0.66 mg/dL (ref 0.57–1.00)
Globulin, Total: 3.5 g/dL (ref 1.5–4.5)
Glucose: 75 mg/dL (ref 70–99)
Potassium: 4.2 mmol/L (ref 3.5–5.2)
Sodium: 137 mmol/L (ref 134–144)
Total Protein: 7.8 g/dL (ref 6.0–8.5)
eGFR: 112 mL/min/{1.73_m2} (ref >59)

## 2022-11-12 LAB — VITAMIN D 25 HYDROXY (VIT D DEFICIENCY, FRACTURES): Vit D, 25-Hydroxy: 11.6 ng/mL — ABNORMAL LOW (ref 30.0–100.0)

## 2022-11-12 LAB — IRON,TIBC AND FERRITIN PANEL
Ferritin: 12 ng/mL — ABNORMAL LOW (ref 15–150)
Iron Saturation: 4 % — CL (ref 15–55)
Iron: 14 ug/dL — ABNORMAL LOW (ref 27–159)
Total Iron Binding Capacity: 341 ug/dL (ref 250–450)
UIBC: 327 ug/dL (ref 131–425)

## 2022-11-12 LAB — VITAMIN B12: Vitamin B-12: 318 pg/mL (ref 232–1245)

## 2022-11-12 LAB — FOLATE: Folate: 6.7 ng/mL (ref >3.0)

## 2022-11-12 LAB — TSH: TSH: 1.23 u[IU]/mL (ref 0.450–4.500)

## 2022-11-12 LAB — SEDIMENTATION RATE: Sed Rate: 35 mm/hr — ABNORMAL HIGH (ref 0–32)

## 2022-11-12 LAB — HEPATITIS C ANTIBODY: Hep C Virus Ab: NONREACTIVE

## 2022-11-12 LAB — HEPATITIS B SURFACE ANTIGEN: Hepatitis B Surface Ag: NEGATIVE

## 2022-11-12 LAB — C-REACTIVE PROTEIN: CRP: 22 mg/L — ABNORMAL HIGH (ref 0–10)

## 2022-11-12 LAB — HEPATITIS B SURFACE ANTIBODY,QUALITATIVE: Hep B Surface Ab, Qual: NONREACTIVE

## 2022-11-12 LAB — HIV ANTIBODY (ROUTINE TESTING W REFLEX): HIV Screen 4th Generation wRfx: NONREACTIVE

## 2022-11-12 LAB — RPR: RPR Ser Ql: NONREACTIVE

## 2022-11-12 LAB — HEPATITIS B CORE ANTIBODY, TOTAL: Hep B Core Total Ab: NEGATIVE

## 2022-11-12 LAB — IGG, IGA, IGM
IgA/Immunoglobulin A, Serum: 257 mg/dL (ref 87–352)
IgG (Immunoglobin G), Serum: 1530 mg/dL (ref 586–1602)
IgM (Immunoglobulin M), Srm: 183 mg/dL (ref 26–217)

## 2022-11-12 MED ORDER — VITAMIN D (ERGOCALCIFEROL) 50000 UNITS PO CAPS: 1.0000 | ORAL_CAPSULE | ORAL | 0 refills | Status: DC

## 2022-11-12 MED ORDER — TANDEM PLUS 162-115.2-1 MG PO CAPS: 1.0000 | ORAL_CAPSULE | Freq: Every day | ORAL | 3 refills | Status: AC

## 2022-11-12 NOTE — Telephone Encounter (Signed)
Please call patient, laboratory evaluation showed vitamin D deficiency level was 11.6  Also evidence of iron deficiency, ferritin was 12  I have written prescription supplement to her pharmacy  Meds ordered this encounter  Medications   FeFum-FePo-FA-B Cmp-C-Zn-Mn-Cu (TANDEM PLUS) 162-115.2-1 MG CAPS    Sig: Take 1 tablet by mouth daily.    Dispense:  90 capsule    Refill:  3   Vitamin D, Ergocalciferol, 50000 units CAPS    Sig: Take 1 tablet by mouth once a week.    Dispense:  10 capsule    Refill:  0      Please also encourage her to increase water intake, mild elevation of ESR C-reactive protein unknown clinical significance, can be related to her anemia as well, may consider repeat laboratory evaluation again

## 2022-11-15 NOTE — Telephone Encounter (Signed)
Called and spoke with the patient and relayed results/recommendations. Pt voiced understanding to start iron and vitamin d that yan sent to pharmacy for them and they voiced understanding.

## 2022-11-17 ENCOUNTER — Encounter: Payer: Self-pay | Admitting: *Deleted

## 2022-11-17 NOTE — Progress Notes (Signed)
Pt seen at 11/11/22 screening event where screening b/p was 145/85, and pt received copy of these results to share with her PCP. Pt did not identify any SDOH insecurities at the event and confirmed Angus Seller, NP, as her PCP. In-basket message sent to PCP with 11/11/22 screening results also. Chart revieiw does indicate that pt has seen PCP this year and has future appt on 11/25/22 and also has ongoing neurology and rehab follow up. No additional health equity team support indicated at this time.

## 2022-11-22 ENCOUNTER — Other Ambulatory Visit: Payer: Self-pay

## 2022-11-22 ENCOUNTER — Encounter: Payer: Self-pay | Admitting: Physical Therapy

## 2022-11-22 ENCOUNTER — Ambulatory Visit: Payer: Medicaid Other | Admitting: Physical Therapy

## 2022-11-22 VITALS — BP 161/92 | HR 62

## 2022-11-22 DIAGNOSIS — R2689 Other abnormalities of gait and mobility: Secondary | ICD-10-CM | POA: Diagnosis present

## 2022-11-22 DIAGNOSIS — R2681 Unsteadiness on feet: Secondary | ICD-10-CM | POA: Diagnosis present

## 2022-11-22 DIAGNOSIS — M6281 Muscle weakness (generalized): Secondary | ICD-10-CM

## 2022-11-22 NOTE — Therapy (Unsigned)
OUTPATIENT PHYSICAL THERAPY NEURO EVALUATION   Patient Name: Denise Moses MRN: [id number] DOB:January 11, 1981, 42 y.o., female Today's Date: 11/22/2022   PCP: Ivonne Andrew, NP REFERRING PROVIDER: Levert Feinstein, MD  END OF SESSION:  PT End of Session - 11/22/22 1414     Visit Number 1    Number of Visits 9   8 + eval   Authorization Type [place] MEDICAID PREPAID HEALTH PLAN    PT Start Time 1403    PT Stop Time 1445    PT Time Calculation (min) 42 min    Equipment Utilized During Treatment Gait belt    Behavior During Therapy WFL for tasks assessed/performed             Past Medical History:  Diagnosis Date   Abnormal MRI, cervical spine    Anemia    Elective abortion    Heart murmur    Herpes    Infection    UTI   Multiple sclerosis    Pregnancy induced hypertension    2001   Vitamin D deficiency    Past Surgical History:  Procedure Laterality Date   CESAREAN SECTION N/A 03/23/2020   Procedure: CESAREAN SECTION;  Surgeon: Tilda Burrow, MD;  Location: [facility];  Service: Obstetrics;  Laterality: N/A;  Arrest of Dilation   CHOLECYSTECTOMY     LAPAROSCOPY N/A 06/09/2016   Procedure: LAPAROSCOPY OPERATIVE;  Surgeon: Tereso Newcomer, MD;  Location: [facility];  Service: Gynecology;  Laterality: N/A;  ectopic    THERAPEUTIC ABORTION     x 4   TUBAL LIGATION     UNILATERAL SALPINGECTOMY Left 06/09/2016   Procedure: UNILATERAL SALPINGECTOMY, REMOVAL OF IUD;  Surgeon: Tereso Newcomer, MD;  Location: [facility];  Service: Gynecology;  Laterality: Left;   WISDOM TOOTH EXTRACTION     Patient Active Problem List   Diagnosis Date Noted   Urinary urgency 11/04/2022   Gait abnormality 11/04/2022   Other fatigue 11/04/2022   Anemia 11/04/2022   Relapsing remitting multiple sclerosis 08/26/2022   Neuropathic pain 09/11/2020   Cesarean delivery, delivered, current hospitalization 03/23/2020   Alpha thalassemia silent carrier 12/18/2019   AMA (advanced maternal age)  multigravida 35+ 09/17/2019   Maternal obesity affecting pregnancy, antepartum 09/17/2019   Chronic hypertension affecting pregnancy 09/17/2019   Supervision of other normal pregnancy, antepartum 09/10/2019   GBS bacteriuria 08/19/2019   Multiple sclerosis 04/30/2019   Cervical myelopathy 04/12/2019   Paresthesia 04/12/2019   Vitamin D deficiency 07/05/2017   Iron deficiency anemia 07/05/2017   Herpes simplex antibody positive 04/15/2016   Recurrent boils 04/13/2016   Family history of breast cancer in first degree relative 04/13/2016   Smoker 07/03/2014    ONSET DATE: Diagnosed w/ RRMS in 2020  REFERRING DIAG: G35 (ICD-10-CM) - Relapsing remitting multiple sclerosis R26.9 (ICD-10-CM) - Gait abnormality R39.15 (ICD-10-CM) - Urinary urgency R53.83 (ICD-10-CM) - Other fatigue D64.9 (ICD-10-CM) - Anemia, unspecified type  THERAPY DIAG:  Other abnormalities of gait and mobility  Muscle weakness (generalized)  Unsteadiness on feet  Rationale for Evaluation and Treatment: Rehabilitation  SUBJECTIVE:  SUBJECTIVE STATEMENT: "I'm just now starting back to walk.  I had a relapse that took away my mobility from February until the end of March.  I could not walk or anything.  Bed bound.  I'm just now starting to walk with the cane." Pt accompanied by: self-patient has just returned to driving  PERTINENT HISTORY: RRMS-to start Ocrevus, tobacco use, HSV  PAIN:  Are you having pain? Yes: NPRS scale: 7/10 Pain location: primarily LUE Pain description: aching, throbbing Aggravating factors: stretching arm out Relieving factors: nothing really  PRECAUTIONS: Fall  WEIGHT BEARING RESTRICTIONS: No  FALLS: Has patient fallen in last 6 months? No  LIVING ENVIRONMENT: Lives with: lives with their family-2  adult children that live with her full time Lives in: House/apartment Stairs: Yes: Internal: 14 steps; on left going up Has following equipment at home: Single point cane, Walker - 4 wheeled, shower chair, and bed side commode  PLOF: Independent with household mobility with device, Independent with community mobility with device, Needs assistance with ADLs, Needs assistance with homemaking, and Needs assistance with transfers  PATIENT GOALS: "To let this cane go and all the assistance that I keep beside the bed."  OBJECTIVE:   DIAGNOSTIC FINDINGS: MRI of brain w/wo in Feb 2024. Progressive changes of demyelination affecting the brain as outlined above, when compared to the most recent study of 10/02/2020. No lesions show restricted diffusion or contrast enhancement however. New lesions are seen in the right temporal lobe, adjacent to the superior margin of the body of the right lateral ventricle, and within the deep white matter of the left parietal and occipital lobe. New lesions are slightly more conspicuous on the sagittal cube FLAIR imaging, more so than the axial standard FLAIR imaging.   MRI thoracic spine w/wo 1. No evidence of demyelination in the thoracic region. 2. Minimal, non-compressive disc bulges from T7-8 through T11-12.   MRI of lumbar in Feb 2024 1. Normal MRI appearance of the conus medullaris and nerve roots of the cauda equina. No evidence for cord compression. 2. Left foraminal to extraforaminal disc protrusion at L3-4, contacting the exiting left L3 nerve root. 3. Additional minimal noncompressive disc bulging at L4-5 and L5-S1 without stenosis or impingement. 4. Mild bilateral facet hypertrophy throughout the lumbar spine.   Pt has MRI of cervical spine scheduled 11/24/2022.  COGNITION: Overall cognitive status: Within functional limits for tasks assessed and pt reports feeling her speech and thinking is "delayed". -pt does not desire ST referral at this  time as she feels this will resolve with time.   SENSATION: Light touch: WFL  COORDINATION: LE RAMS:  slow, but coordinated Bilateral Heel-to-shin:  WFL  EDEMA:  None significant in BLE  MUSCLE TONE: None noted in BLE during functional assessments.  POSTURE: No Significant postural limitations  LOWER EXTREMITY ROM:     Active  Right Eval Left Eval  Hip flexion [facility]  Hip extension    Hip abduction    Hip adduction " "  Hip internal rotation    Hip external rotation " "  Knee flexion " "  Knee extension " "  Ankle dorsiflexion " "  Ankle plantarflexion " "  Ankle inversion    Ankle eversion     (Blank rows = not tested)  LOWER EXTREMITY MMT:    MMT Right Eval Left Eval  Hip flexion 4/5 3+/5  Hip extension    Hip abduction    Hip adduction 4/5 3/5  Hip internal rotation  Hip external rotation    Knee flexion    Knee extension 4+/5 3+/5  Ankle dorsiflexion 5/5 4/5  Ankle plantarflexion    Ankle inversion    Ankle eversion    (Blank rows = not tested)  BED MOBILITY:  Sit to supine Modified independence and Mod A Supine to sit Modified independence and Mod A Varies depending on the day and weather per report, pt states some days she can use the chair next to her bed as a bed rail and get up herself.  TRANSFERS: Assistive device utilized: None  Sit to stand: SBA Stand to sit: SBA Chair to chair: SBA  GAIT: Gait pattern: step through pattern, decreased arm swing- Left, decreased stance time- Left, decreased stride length, decreased hip/knee flexion- Left, and wide BOS Distance walked: various clinic distances Assistive device utilized: Single point cane Level of assistance: SBA Comments: Functional weakness of LLE noted during gait, pt uses SPC correctly on contralateral side with good sequencing.  FUNCTIONAL TESTS:  5 times sit to stand: 41.81 seconds w/ hands on knees Timed up and go (TUG): 22.47 seconds w/ SPC 10 meter walk test: 14.66  seconds w/ SPC = 0.68 m/sec OR 2.25 ft/sec Dynamic Gait Index: To be assessed.  PATIENT SURVEYS:  None relevant to CC.  TODAY'S TREATMENT:                                                                                                                              DATE: N/A    PATIENT EDUCATION: Education details: PT POC, assessments used and to be used, and goals to be set. Person educated: Patient Education method: Explanation Education comprehension: verbalized understanding  HOME EXERCISE PROGRAM: To be established.  GOALS: Goals reviewed with patient? Yes  SHORT TERM GOALS: Target date: ***  *** Baseline: Goal status: {GOALSTATUS:25110}  2.  *** Baseline:  Goal status: {GOALSTATUS:25110}  3.  *** Baseline:  Goal status: {GOALSTATUS:25110}  4.  *** Baseline:  Goal status: {GOALSTATUS:25110}  5.  *** Baseline:  Goal status: {GOALSTATUS:25110}  6.  *** Baseline:  Goal status: {GOALSTATUS:25110}  LONG TERM GOALS: Target date: ***  *** Baseline:  Goal status: {GOALSTATUS:25110}  2.  *** Baseline:  Goal status: {GOALSTATUS:25110}  3.  *** Baseline:  Goal status: {GOALSTATUS:25110}  4.  *** Baseline:  Goal status: {GOALSTATUS:25110}  5.  *** Baseline:  Goal status: {GOALSTATUS:25110}  6.  *** Baseline:  Goal status: {GOALSTATUS:25110}  ASSESSMENT:  CLINICAL IMPRESSION: Patient is a *** y.o. *** who was seen today for physical therapy evaluation and treatment for ***.  Pt has a significant PMH of ***.  Identified impairments include ***.  Evaluation via the following assessment tools: *** indicate fall risk.  They would benefit from skilled PT to address impairments as noted and progress towards long term goals.   OBJECTIVE IMPAIRMENTS: {opptimpairments:25111}.   ACTIVITY LIMITATIONS: {activitylimitations:27494}  PARTICIPATION LIMITATIONS: {participationrestrictions:25113}  PERSONAL FACTORS: {Personal factors:25162} are also affecting  patient's functional outcome.   REHAB POTENTIAL: {rehabpotential:25112}  CLINICAL DECISION MAKING: {clinical decision making:25114}  EVALUATION COMPLEXITY: {Evaluation complexity:25115}  PLAN:  PT FREQUENCY: 1x/week (pt preference)  PT DURATION: 8 weeks  PLANNED INTERVENTIONS: {rehab planned interventions:25118::"Therapeutic exercises","Therapeutic activity","Neuromuscular re-education","Balance training","Gait training","Patient/Family education","Self Care","Joint mobilization"}  PLAN FOR NEXT SESSION: Sadie Haber, PT, DPT 11/22/2022, 2:46 PM

## 2022-11-24 ENCOUNTER — Ambulatory Visit
Admission: RE | Admit: 2022-11-24 | Discharge: 2022-11-24 | Disposition: A | Discharge status: Home / Self Care | Payer: Medicaid Other | Source: Ambulatory Visit | Attending: Neurology | Admitting: Neurology

## 2022-11-24 DIAGNOSIS — R5383 Other fatigue: Secondary | ICD-10-CM

## 2022-11-24 DIAGNOSIS — G35 Multiple sclerosis: Secondary | ICD-10-CM

## 2022-11-24 DIAGNOSIS — D649 Anemia, unspecified: Secondary | ICD-10-CM | POA: Diagnosis not present

## 2022-11-24 DIAGNOSIS — R269 Unspecified abnormalities of gait and mobility: Secondary | ICD-10-CM

## 2022-11-24 DIAGNOSIS — R3915 Urgency of urination: Secondary | ICD-10-CM | POA: Diagnosis not present

## 2022-11-24 MED ORDER — GADOPICLENOL 0.5 MMOL/ML IV SOLN
9.0000 mL | Freq: Once | INTRAVENOUS | Status: AC | PRN
  Administered 2022-11-24: 9 mL via INTRAVENOUS

## 2022-11-25 ENCOUNTER — Ambulatory Visit: Payer: Medicaid Other | Admitting: Nurse Practitioner

## 2022-12-01 ENCOUNTER — Telehealth: Payer: Self-pay | Admitting: Neurology

## 2022-12-01 NOTE — Telephone Encounter (Signed)
Jc virus titer was drawn on April 4th, please find result

## 2022-12-02 ENCOUNTER — Ambulatory Visit: Payer: 59 | Attending: Neurology | Admitting: Physical Therapy

## 2022-12-02 ENCOUNTER — Telehealth: Payer: Self-pay | Admitting: Physical Therapy

## 2022-12-02 DIAGNOSIS — M6281 Muscle weakness (generalized): Secondary | ICD-10-CM | POA: Insufficient documentation

## 2022-12-02 DIAGNOSIS — R2689 Other abnormalities of gait and mobility: Secondary | ICD-10-CM | POA: Insufficient documentation

## 2022-12-02 DIAGNOSIS — R2681 Unsteadiness on feet: Secondary | ICD-10-CM | POA: Insufficient documentation

## 2022-12-02 NOTE — Telephone Encounter (Signed)
Called and spoke to [facility] who are faxing over results at this time.

## 2022-12-02 NOTE — Telephone Encounter (Signed)
Placed results in office for MD review

## 2022-12-02 NOTE — Telephone Encounter (Signed)
Jc virus titer was high 2.96, not a good candidate for Tysarbri.  Need to get a list of MS treatment from [facility]

## 2022-12-02 NOTE — Telephone Encounter (Signed)
Called patient and LVM regarding 3-cancellation/no-show policy. This is patient 2nd no show for PT. Explained that per policy if patient has another cancellation/no-show will have to discharge and have new referral to PT. Reminded patient of upcoming appointment.   Maryruth Eve, PT, DPT

## 2022-12-03 NOTE — Telephone Encounter (Signed)
Shawn called from [facility] access solution. Asking if infusion order can be faxed to [phone].

## 2022-12-06 ENCOUNTER — Telehealth: Payer: Self-pay | Admitting: Neurology

## 2022-12-06 NOTE — Telephone Encounter (Signed)
Relapsing Remitting multiple sclerosis   Gait abnormality             Previous MRIs showed evidence of cervical involvement Discussed with patient, decided on Ocrelizumab Lab evaluation MRI cervical w/wo Cymbalta titrating to 60mg  for body achy pain, depression/anxiety              insurance will not cover ocrelizumab, switch to Briumvi according to the protocol.  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

## 2022-12-06 NOTE — Telephone Encounter (Signed)
Dr. Terrace Arabia is currently reviewing other medications for patient, Lab results warranted a change in the meds. Please advise if call comes in again that patient may not get medication, but if something changes the number has been noted to send the order. Thanks

## 2022-12-09 ENCOUNTER — Ambulatory Visit: Payer: 59 | Admitting: Physical Therapy

## 2022-12-10 ENCOUNTER — Ambulatory Visit (HOSPITAL_COMMUNITY)
Admission: EM | Admit: 2022-12-10 | Discharge: 2022-12-10 | Disposition: A | Discharge status: Home / Self Care | Payer: Medicaid Other | Attending: Family Medicine | Admitting: Family Medicine

## 2022-12-10 ENCOUNTER — Encounter (HOSPITAL_COMMUNITY): Payer: Self-pay

## 2022-12-10 DIAGNOSIS — N12 Tubulo-interstitial nephritis, not specified as acute or chronic: Secondary | ICD-10-CM | POA: Insufficient documentation

## 2022-12-10 DIAGNOSIS — R509 Fever, unspecified: Secondary | ICD-10-CM | POA: Diagnosis not present

## 2022-12-10 DIAGNOSIS — E876 Hypokalemia: Secondary | ICD-10-CM | POA: Diagnosis not present

## 2022-12-10 DIAGNOSIS — D649 Anemia, unspecified: Secondary | ICD-10-CM | POA: Insufficient documentation

## 2022-12-10 LAB — CBC WITH DIFFERENTIAL/PLATELET
Abs Immature Granulocytes: 0.12 10*3/uL — ABNORMAL HIGH (ref 0.00–0.07)
Basophils Absolute: 0.1 10*3/uL (ref 0.0–0.1)
Basophils Relative: 0 %
Eosinophils Absolute: 0 10*3/uL (ref 0.0–0.5)
Eosinophils Relative: 0 %
HCT: 27.4 % — ABNORMAL LOW (ref 36.0–46.0)
Hemoglobin: 8.3 g/dL — ABNORMAL LOW (ref 12.0–15.0)
Immature Granulocytes: 1 %
Lymphocytes Relative: 5 %
Lymphs Abs: 1 10*3/uL (ref 0.7–4.0)
MCH: 22 pg — ABNORMAL LOW (ref 26.0–34.0)
MCHC: 30.3 g/dL (ref 30.0–36.0)
MCV: 72.7 fL — ABNORMAL LOW (ref 80.0–100.0)
Monocytes Absolute: 1.4 10*3/uL — ABNORMAL HIGH (ref 0.1–1.0)
Monocytes Relative: 8 %
Neutro Abs: 15.7 10*3/uL — ABNORMAL HIGH (ref 1.7–7.7)
Neutrophils Relative %: 86 %
Platelets: 296 10*3/uL (ref 150–400)
RBC: 3.77 MIL/uL — ABNORMAL LOW (ref 3.87–5.11)
RDW: 18.2 % — ABNORMAL HIGH (ref 11.5–15.5)
WBC: 18.2 10*3/uL — ABNORMAL HIGH (ref 4.0–10.5)
nRBC: 0 % (ref 0.0–0.2)

## 2022-12-10 LAB — COMPREHENSIVE METABOLIC PANEL
ALT: 32 U/L (ref 0–44)
AST: 19 U/L (ref 15–41)
Albumin: 3.5 g/dL (ref 3.5–5.0)
Alkaline Phosphatase: 73 U/L (ref 38–126)
Anion gap: 11 (ref 5–15)
BUN: 5 mg/dL — ABNORMAL LOW (ref 6–20)
CO2: 19 mmol/L — ABNORMAL LOW (ref 22–32)
Calcium: 8.9 mg/dL (ref 8.9–10.3)
Chloride: 105 mmol/L (ref 98–111)
Creatinine, Ser: 0.81 mg/dL (ref 0.44–1.00)
GFR, Estimated: >60 mL/min (ref >60)
Glucose, Bld: 88 mg/dL (ref 70–99)
Potassium: 2.9 mmol/L — ABNORMAL LOW (ref 3.5–5.1)
Sodium: 135 mmol/L (ref 135–145)
Total Bilirubin: 1.2 mg/dL (ref 0.3–1.2)
Total Protein: 7.3 g/dL (ref 6.5–8.1)

## 2022-12-10 LAB — POCT URINALYSIS DIP (MANUAL ENTRY)
Bilirubin, UA: NEGATIVE
Glucose, UA: 100 mg/dL — AB
Nitrite, UA: POSITIVE — AB
Protein Ur, POC: >=300 mg/dL — AB
Spec Grav, UA: 1.025 (ref 1.010–1.025)
Urobilinogen, UA: 1 E.U./dL
pH, UA: 6 (ref 5.0–8.0)

## 2022-12-10 LAB — POCT URINE PREGNANCY: Preg Test, Ur: NEGATIVE

## 2022-12-10 MED ORDER — ACETAMINOPHEN 325 MG PO TABS
650.0000 mg | ORAL_TABLET | Freq: Once | ORAL | Status: AC
  Administered 2022-12-10: 650 mg via ORAL

## 2022-12-10 MED ORDER — CEFTRIAXONE SODIUM 1 G IJ SOLR
INTRAMUSCULAR | Status: AC
  Filled 2022-12-10: qty 10

## 2022-12-10 MED ORDER — CEFTRIAXONE SODIUM 1 G IJ SOLR
1.0000 g | INTRAMUSCULAR | Status: AC
  Administered 2022-12-10: 1 g via INTRAMUSCULAR

## 2022-12-10 MED ORDER — ACETAMINOPHEN 325 MG PO TABS
ORAL_TABLET | ORAL | Status: AC
  Filled 2022-12-10: qty 2

## 2022-12-10 MED ORDER — SULFAMETHOXAZOLE-TRIMETHOPRIM 800-160 MG PO TABS: 1.0000 | ORAL_TABLET | Freq: Two times a day (BID) | ORAL | 0 refills | Status: AC

## 2022-12-10 MED ORDER — ONDANSETRON 4 MG PO TBDP
8.0000 mg | ORAL_TABLET | ORAL | Status: AC
  Administered 2022-12-10: 8 mg via ORAL

## 2022-12-10 MED ORDER — LIDOCAINE HCL (PF) 1 % IJ SOLN
INTRAMUSCULAR | Status: AC
  Filled 2022-12-10: qty 2

## 2022-12-10 MED ORDER — SODIUM CHLORIDE 0.9 % IV SOLN
Freq: Once | INTRAVENOUS | Status: AC
  Administered 2022-12-10: 999 mL/h via INTRAVENOUS

## 2022-12-10 MED ORDER — ONDANSETRON 4 MG PO TBDP
ORAL_TABLET | ORAL | Status: AC
  Filled 2022-12-10: qty 2

## 2022-12-10 MED ORDER — ONDANSETRON HCL 4 MG PO TABS: 8.0000 mg | ORAL_TABLET | Freq: Three times a day (TID) | ORAL | 0 refills | Status: AC | PRN

## 2022-12-10 NOTE — ED Triage Notes (Signed)
Patient reports that she has had urinary frequency and dysuria x 5 days.  Patient also reports that she has had dizziness, N/v, and a headache since yesterday.  Patient states she took 2 Azo tabs at 0700 today.

## 2022-12-10 NOTE — Discharge Instructions (Addendum)
You were treated today pyelonephritis she has a complicated UTI.  You received Rocephin 1000 mg in the form of an injection, Zofran for nausea, Tylenol, and a liter of fluids.  At home continue Tylenol as needed for fever.  If you continue to have a fever over the next 24 hours this indication to go immediately to the emergency department as this means that the medications that are given to treat your infection is not working appropriately. Start double strength Bactrim to take twice daily for 14 days.  Follow-up here or at your primary care provider in 2 weeks after completion of your antibiotics for repeat urine culture.

## 2022-12-10 NOTE — ED Provider Notes (Signed)
[facility]    CSN: [id number] Arrival date & time: 12/10/22  1233      History   Chief Complaint Chief Complaint  Patient presents with   Emesis   Headache   Generalized Body Aches   Urinary Frequency   Dysuria   Fever    HPI Denise Moses is a 42 y.o. female.   HPI Patient with a history of relapsing remitting MS, chronic low back pain, cystitis, abnormal gait, presents today with a 5-day history of dysuria which has progressed to chills, nausea.  Patient reports thinking that she had a UTI and had been treating symptoms with cranberry juice, Azo's and reports over the last 2 days she felt so bad that she has been laying in bed.  Today she awakened with chills and on arrival had a fever of 103. She denies a history of recurrent urinary tract infections although it according to chart review she does have a history of recurrent cystitis.  Endorses nausea without abdominal pain.  She denies any significant flank pain.  Past Medical History:  Diagnosis Date   Abnormal MRI, cervical spine    Anemia    Elective abortion    Heart murmur    Herpes    Infection    UTI   Multiple sclerosis (HCC)    Pregnancy induced hypertension    2001   Vitamin D deficiency     Patient Active Problem List   Diagnosis Date Noted   Urinary urgency 11/04/2022   Gait abnormality 11/04/2022   Other fatigue 11/04/2022   Anemia 11/04/2022   Relapsing remitting multiple sclerosis (HCC) 08/26/2022   Neuropathic pain 09/11/2020   Cesarean delivery, delivered, current hospitalization 03/23/2020   Alpha thalassemia silent carrier 12/18/2019   AMA (advanced maternal age) multigravida 35+ 09/17/2019   Maternal obesity affecting pregnancy, antepartum 09/17/2019   Chronic hypertension affecting pregnancy 09/17/2019   Supervision of other normal pregnancy, antepartum 09/10/2019   GBS bacteriuria 08/19/2019   Multiple sclerosis (HCC) 04/30/2019   Cervical myelopathy (HCC)  04/12/2019   Paresthesia 04/12/2019   Vitamin D deficiency 07/05/2017   Iron deficiency anemia 07/05/2017   Herpes simplex antibody positive 04/15/2016   Recurrent boils 04/13/2016   Family history of breast cancer in first degree relative 04/13/2016   Smoker 07/03/2014    Past Surgical History:  Procedure Laterality Date   CESAREAN SECTION N/A 03/23/2020   Procedure: CESAREAN SECTION;  Surgeon: Tilda Burrow, MD;  Location: [facility];  Service: Obstetrics;  Laterality: N/A;  Arrest of Dilation   CHOLECYSTECTOMY     LAPAROSCOPY N/A 06/09/2016   Procedure: LAPAROSCOPY OPERATIVE;  Surgeon: Tereso Newcomer, MD;  Location: [facility];  Service: Gynecology;  Laterality: N/A;  ectopic    THERAPEUTIC ABORTION     x 4   TUBAL LIGATION     UNILATERAL SALPINGECTOMY Left 06/09/2016   Procedure: UNILATERAL SALPINGECTOMY, REMOVAL OF IUD;  Surgeon: Tereso Newcomer, MD;  Location: [facility];  Service: Gynecology;  Laterality: Left;   WISDOM TOOTH EXTRACTION      OB History     Gravida  12   Para  6   Term  5   Preterm  1   AB  6   Living  6      SAB  2   IAB  3   Ectopic  1   Multiple  0   Live Births  6  Home Medications    Prior to Admission medications   Medication Sig Start Date End Date Taking? Authorizing Provider  ondansetron (ZOFRAN) 4 MG tablet Take 2 tablets (8 mg total) by mouth every 8 (eight) hours as needed for nausea or vomiting. 12/10/22  Yes Bing Neighbors, NP  sulfamethoxazole-trimethoprim (BACTRIM DS) 800-160 MG tablet Take 1 tablet by mouth 2 (two) times daily for 14 days. 12/10/22 12/24/22 Yes Bing Neighbors, NP  DULoxetine (CYMBALTA) 30 MG capsule Take 1 capsule (30 mg total) by mouth daily. 11/04/22   Levert Feinstein, MD  DULoxetine (CYMBALTA) 60 MG capsule Take 1 capsule (60 mg total) by mouth daily. 11/04/22   Levert Feinstein, MD  FeFum-FePo-FA-B Cmp-C-Zn-Mn-Cu (TANDEM PLUS) 161-096.0-4 MG CAPS Take 1 tablet by mouth daily. 11/12/22   Levert Feinstein,  MD  naproxen (NAPROSYN) 500 MG tablet Take 500 mg by mouth 2 (two) times daily with a meal.    [provider]  NIFEdipine (PROCARDIA XL/NIFEDICAL-XL) 90 MG 24 hr tablet Take 1 tablet (90 mg total) by mouth daily. Patient not taking: Reported on 04/28/2021 05/08/20 05/08/21  Barbette Merino, NP  oxybutynin (DITROPAN XL) 10 MG 24 hr tablet Take 1 tablet (10 mg total) by mouth at bedtime. 11/04/22   Levert Feinstein, MD  predniSONE (DELTASONE) 10 MG tablet Take 10 mg by mouth daily with breakfast.    [provider]  Vitamin D, Ergocalciferol, 50000 units CAPS Take 1 tablet by mouth once a week. 11/12/22   Levert Feinstein, MD    Family History Family History  Problem Relation Age of Onset   Breast cancer Mother 84       was a smoker, alcohol drinker in early life    Breast cancer Maternal Aunt    Cancer Maternal Grandfather        unsure of type   Other Father        unsure of history   Breast cancer Maternal Aunt     Social History Social History   Tobacco Use   Smoking status: Former    Packs/day: 0.25    Years: 10.00    Additional pack years: 0.00    Total pack years: 2.50    Types: Cigarettes    Quit date: 03/2019    Years since quitting: 3.7   Smokeless tobacco: Never   Tobacco comments:    2-3 cigs/day  Vaping Use   Vaping Use: Never used  Substance Use Topics   Alcohol use: Not Currently    Comment: social drinker   Drug use: Yes    Frequency: 5.0 times per week    Types: Marijuana    Comment: last used before delivery     Allergies   Patient has no known allergies.   Review of Systems Review of Systems Pertinent negatives listed in HPI   Physical Exam Triage Vital Signs ED Triage Vitals [12/10/22 1252]  Enc Vitals Group     BP 138/82     Pulse Rate (!) 115     Resp 18     Temp (!) 103 F (39.4 C)     Temp Source Oral     SpO2 95 %     Weight      Height      Head Circumference      Peak Flow      Pain Score 9     Pain Loc      Pain Edu?       Excl. in  GC?    No data found.  Updated Vital Signs BP 125/78 (BP Location: Left Arm)   Pulse 95   Temp 99.7 F (37.6 C) (Oral)   Resp 16   LMP 11/21/2022 (Approximate)   SpO2 96%   Visual Acuity Right Eye Distance:   Left Eye Distance:   Bilateral Distance:    Right Eye Near:   Left Eye Near:    Bilateral Near:     Physical Exam Constitutional:      Appearance: She is ill-appearing. She is not toxic-appearing or diaphoretic.  HENT:     Head: Normocephalic and atraumatic.     Mouth/Throat:     Mouth: Mucous membranes are moist.  Eyes:     Extraocular Movements: Extraocular movements intact.     Pupils: Pupils are equal, round, and reactive to light.  Cardiovascular:     Rate and Rhythm: Regular rhythm. Tachycardia present.  Pulmonary:     Effort: Pulmonary effort is normal.     Breath sounds: Normal breath sounds.  Abdominal:     General: Bowel sounds are normal.     Palpations: Abdomen is soft.     Tenderness: There is abdominal tenderness in the suprapubic area. There is no right CVA tenderness or left CVA tenderness.  Musculoskeletal:        General: Normal range of motion.     Cervical back: Normal range of motion.  Skin:    General: Skin is warm.  Neurological:     Mental Status: She is alert.     GCS: GCS eye subscore is 4. GCS verbal subscore is 5. GCS motor subscore is 6.      UC Treatments / Results  Labs (all labs ordered are listed, but only abnormal results are displayed) Labs Reviewed  CBC WITH DIFFERENTIAL/PLATELET - Abnormal; Notable for the following components:      Result Value   WBC 18.2 (*)    RBC 3.77 (*)    Hemoglobin 8.3 (*)    HCT 27.4 (*)    MCV 72.7 (*)    MCH 22.0 (*)    RDW 18.2 (*)    Neutro Abs 15.7 (*)    Monocytes Absolute 1.4 (*)    Abs Immature Granulocytes 0.12 (*)    All other components within normal limits  COMPREHENSIVE METABOLIC PANEL - Abnormal; Notable for the following components:   Potassium 2.9  (*)    CO2 19 (*)    BUN 5 (*)    All other components within normal limits  POCT URINALYSIS DIP (MANUAL ENTRY) - Abnormal; Notable for the following components:   Color, UA orange (*)    Clarity, UA cloudy (*)    Glucose, UA =100 (*)    Ketones, POC UA moderate (40) (*)    Blood, UA large (*)    Protein Ur, POC >=300 (*)    Nitrite, UA Positive (*)    Leukocytes, UA Large (3+) (*)    All other components within normal limits  URINE CULTURE  POCT URINE PREGNANCY    EKG   Radiology No results found.  Procedures Procedures (including critical care time)  Medications Ordered in UC Medications  acetaminophen (TYLENOL) tablet 650 mg (650 mg Oral Given 12/10/22 1303)  cefTRIAXone (ROCEPHIN) injection 1 g (1 g Intramuscular Given 12/10/22 1344)  0.9 %  sodium chloride infusion (0 mLs Intravenous Stopped 12/10/22 1520)  ondansetron (ZOFRAN-ODT) disintegrating tablet 8 mg (8 mg Oral Given 12/10/22 1402)    Initial  Impression / Assessment and Plan / UC Course  I have reviewed the triage vital signs and the nursing notes.  Pertinent labs & imaging results that were available during my care of the patient were reviewed by me and considered in my medical decision making (see chart for details).   Based on patient's presentation, urinalysis, and symptoms x 5 days, patient was treated for pyelonephritis in clinic initiated fluid hydration- 1000 mL bolus administered to improve hydrate and treat tachycardia, tachycardia and fever resolved with fluid rehydration.  Patient was also given acetaminophen 650 mg on arrival as her temperature was 103.5.  On evaluation patient is ill-appearing,  although not toxic appearing.  Patient received Rocephin 500 mg IM and was continued on Bactrim double strength twice daily for 14 days.  Zofran was given for nausea and patient was prescribed Zofran to continue at home if any nausea symptoms persisted.  CMP, CBC, urine culture pending patient given very strict  instructions to go to the emergency department if her symptoms worsen despite treatment during clinic and when she started her oral antibiotics as this would be indication that she is septic.  Patient verbalized understanding and agreement with today's plan and was discharged. Final Clinical Impressions(s) / UC Diagnoses   Final diagnoses:  Pyelonephritis  Fever and chills     Discharge Instructions      You were treated today pyelonephritis she has a complicated UTI.  You received Rocephin 1000 mg in the form of an injection, Zofran for nausea, Tylenol, and a liter of fluids.  At home continue Tylenol as needed for fever.  If you continue to have a fever over the next 24 hours this indication to go immediately to the emergency department as this means that the medications that are given to treat your infection is not working appropriately. Start double strength Bactrim to take twice daily for 14 days.  Follow-up here or at your primary care provider in 2 weeks after completion of your antibiotics for repeat urine culture.     ED Prescriptions     Medication Sig Dispense Auth. Provider   sulfamethoxazole-trimethoprim (BACTRIM DS) 800-160 MG tablet Take 1 tablet by mouth 2 (two) times daily for 14 days. 28 tablet Bing Neighbors, NP   ondansetron (ZOFRAN) 4 MG tablet Take 2 tablets (8 mg total) by mouth every 8 (eight) hours as needed for nausea or vomiting. 12 tablet Bing Neighbors, NP      PDMP not reviewed this encounter.   Bing Neighbors, NP 12/11/22 1310

## 2022-12-11 ENCOUNTER — Telehealth (HOSPITAL_COMMUNITY): Payer: Self-pay | Admitting: Family Medicine

## 2022-12-11 LAB — URINE CULTURE

## 2022-12-11 MED ORDER — POTASSIUM CHLORIDE CRYS ER 20 MEQ PO TBCR: 20.0000 meq | EXTENDED_RELEASE_TABLET | Freq: Two times a day (BID) | ORAL | 0 refills | Status: AC

## 2022-12-11 NOTE — Telephone Encounter (Signed)
Reviewed lab results, Low K+ 2.9, replaced potassium 40 mg daily x 3 days and advised patient to follow-up for potassium recheck in 3 days. Mychart message sent advising of abnormal lab, follow-up and treatment

## 2022-12-12 LAB — URINE CULTURE: Culture: >=100000 — AB

## 2022-12-13 ENCOUNTER — Telehealth: Payer: Self-pay | Admitting: Neurology

## 2022-12-13 NOTE — Telephone Encounter (Signed)
I have sent a message to infusion to return call to patient about infusion concerns

## 2022-12-13 NOTE — Telephone Encounter (Signed)
Pt is asking for a call to discuss where things stand with her Ocrevus treatment

## 2022-12-15 NOTE — Telephone Encounter (Signed)
Pt called. Requesting a call back from nurse.  

## 2022-12-16 ENCOUNTER — Ambulatory Visit: Payer: 59 | Admitting: Physical Therapy

## 2022-12-16 ENCOUNTER — Encounter: Payer: Self-pay | Admitting: Physical Therapy

## 2022-12-16 VITALS — BP 144/76 | HR 64

## 2022-12-16 DIAGNOSIS — R2681 Unsteadiness on feet: Secondary | ICD-10-CM | POA: Diagnosis not present

## 2022-12-16 DIAGNOSIS — R2689 Other abnormalities of gait and mobility: Secondary | ICD-10-CM | POA: Diagnosis not present

## 2022-12-16 DIAGNOSIS — M6281 Muscle weakness (generalized): Secondary | ICD-10-CM

## 2022-12-16 NOTE — Therapy (Signed)
OUTPATIENT PHYSICAL THERAPY NEURO TREATMENT   Patient Name: Denise Moses MRN: [id number] DOB:17-Feb-1981, 42 y.o., female Today's Date: 12/16/2022   PCP: Ivonne Andrew, NP REFERRING PROVIDER: Levert Feinstein, MD  END OF SESSION:  PT End of Session - 12/16/22 1405     Visit Number 2    Number of Visits 9   8 + eval   Date for PT Re-Evaluation 01/28/23   pushed out due to potential need for re-scheduling   Authorization Type [place] MEDICAID PREPAID HEALTH PLAN    PT Start Time 1402    PT Stop Time 1442    PT Time Calculation (min) 40 min    Equipment Utilized During Treatment Gait belt    Activity Tolerance Patient tolerated treatment well;Patient limited by fatigue    Behavior During Therapy WFL for tasks assessed/performed            Past Medical History:  Diagnosis Date   Abnormal MRI, cervical spine    Anemia    Elective abortion    Heart murmur    Herpes    Infection    UTI   Multiple sclerosis (HCC)    Pregnancy induced hypertension    2001   Vitamin D deficiency    Past Surgical History:  Procedure Laterality Date   CESAREAN SECTION N/A 03/23/2020   Procedure: CESAREAN SECTION;  Surgeon: Tilda Burrow, MD;  Location: [facility] ORS;  Service: Obstetrics;  Laterality: N/A;  Arrest of Dilation   CHOLECYSTECTOMY     LAPAROSCOPY N/A 06/09/2016   Procedure: LAPAROSCOPY OPERATIVE;  Surgeon: Tereso Newcomer, MD;  Location: [facility] ORS;  Service: Gynecology;  Laterality: N/A;  ectopic    THERAPEUTIC ABORTION     x 4   TUBAL LIGATION     UNILATERAL SALPINGECTOMY Left 06/09/2016   Procedure: UNILATERAL SALPINGECTOMY, REMOVAL OF IUD;  Surgeon: Tereso Newcomer, MD;  Location: [facility] ORS;  Service: Gynecology;  Laterality: Left;   WISDOM TOOTH EXTRACTION     Patient Active Problem List   Diagnosis Date Noted   Urinary urgency 11/04/2022   Gait abnormality 11/04/2022   Other fatigue 11/04/2022   Anemia 11/04/2022   Relapsing remitting multiple sclerosis (HCC) 08/26/2022    Neuropathic pain 09/11/2020   Cesarean delivery, delivered, current hospitalization 03/23/2020   Alpha thalassemia silent carrier 12/18/2019   AMA (advanced maternal age) multigravida 35+ 09/17/2019   Maternal obesity affecting pregnancy, antepartum 09/17/2019   Chronic hypertension affecting pregnancy 09/17/2019   Supervision of other normal pregnancy, antepartum 09/10/2019   GBS bacteriuria 08/19/2019   Multiple sclerosis (HCC) 04/30/2019   Cervical myelopathy (HCC) 04/12/2019   Paresthesia 04/12/2019   Vitamin D deficiency 07/05/2017   Iron deficiency anemia 07/05/2017   Herpes simplex antibody positive 04/15/2016   Recurrent boils 04/13/2016   Family history of breast cancer in first degree relative 04/13/2016   Smoker 07/03/2014    ONSET DATE: Diagnosed w/ RRMS in 2020  REFERRING DIAG: G35 (ICD-10-CM) - Relapsing remitting multiple sclerosis R26.9 (ICD-10-CM) - Gait abnormality R39.15 (ICD-10-CM) - Urinary urgency R53.83 (ICD-10-CM) - Other fatigue D64.9 (ICD-10-CM) - Anemia, unspecified type  THERAPY DIAG:  Other abnormalities of gait and mobility  Muscle weakness (generalized)  Unsteadiness on feet  Rationale for Evaluation and Treatment: Rehabilitation  SUBJECTIVE:  SUBJECTIVE STATEMENT: Pt states she was recently put on an antibiotic in the ED for pyelonephritis that does not agree with her stomach.  She is to start Ocrevus infusions next Thursday, 5/23 at 9am.  She states her fatigue today is 5/10.  She denies recent ralls and continues use of SPC pretty much full-time.  She reports removing shower chair and taking standing showers.  She denies other acute changes and has children helping intermittently based on day-to-day needs. Pt accompanied by: self-patient has just returned to  driving  PERTINENT HISTORY: RRMS-to start Ocrevus, tobacco use, HSV  PAIN:  Are you having pain? No  PRECAUTIONS: Fall  WEIGHT BEARING RESTRICTIONS: No  FALLS: Has patient fallen in last 6 months? No  LIVING ENVIRONMENT: Lives with: lives with their family-2 adult children that live with her full time Lives in: House/apartment Stairs: Yes: Internal: 14 steps; on left going up Has following equipment at home: Single point cane, Walker - 4 wheeled, shower chair, and bed side commode  PLOF: Independent with household mobility with device, Independent with community mobility with device, Needs assistance with ADLs, Needs assistance with homemaking, and Needs assistance with transfers  PATIENT GOALS: "To let this cane go and all the assistance that I keep beside the bed."  OBJECTIVE:   DIAGNOSTIC FINDINGS: MRI of brain w/wo in Feb 2024. Progressive changes of demyelination affecting the brain as outlined above, when compared to the most recent study of 10/02/2020. No lesions show restricted diffusion or contrast enhancement however. New lesions are seen in the right temporal lobe, adjacent to the superior margin of the body of the right lateral ventricle, and within the deep white matter of the left parietal and occipital lobe. New lesions are slightly more conspicuous on the sagittal cube FLAIR imaging, more so than the axial standard FLAIR imaging.   MRI thoracic spine w/wo 1. No evidence of demyelination in the thoracic region. 2. Minimal, non-compressive disc bulges from T7-8 through T11-12.   MRI of lumbar in Feb 2024 1. Normal MRI appearance of the conus medullaris and nerve roots of the cauda equina. No evidence for cord compression. 2. Left foraminal to extraforaminal disc protrusion at L3-4, contacting the exiting left L3 nerve root. 3. Additional minimal noncompressive disc bulging at L4-5 and L5-S1 without stenosis or impingement. 4. Mild bilateral facet  hypertrophy throughout the lumbar spine.   Pt has MRI of cervical spine scheduled 11/24/2022.  COGNITION: Overall cognitive status: Within functional limits for tasks assessed and pt reports feeling her speech and thinking is "delayed". -pt does not desire ST referral at this time as she feels this will resolve with time.   SENSATION: Light touch: WFL  COORDINATION: LE RAMS:  slow, but coordinated Bilateral Heel-to-shin:  WFL  EDEMA:  None significant in BLE  MUSCLE TONE: None noted in BLE during functional assessments.  POSTURE: No Significant postural limitations  LOWER EXTREMITY ROM:     Active  Right Eval Left Eval  Hip flexion [facility]  Hip extension    Hip abduction    Hip adduction " "  Hip internal rotation    Hip external rotation " "  Knee flexion " "  Knee extension " "  Ankle dorsiflexion " "  Ankle plantarflexion " "  Ankle inversion    Ankle eversion     (Blank rows = not tested)  LOWER EXTREMITY MMT:    MMT Right Eval Left Eval  Hip flexion 4/5 3+/5  Hip extension  Hip abduction    Hip adduction 4/5 3/5  Hip internal rotation    Hip external rotation    Knee flexion    Knee extension 4+/5 3+/5  Ankle dorsiflexion 5/5 4/5  Ankle plantarflexion    Ankle inversion    Ankle eversion    (Blank rows = not tested)  BED MOBILITY:  Sit to supine Modified independence and Mod A Supine to sit Modified independence and Mod A Varies depending on the day and weather per report, pt states some days she can use the chair next to her bed as a bed rail and get up herself.  TRANSFERS: Assistive device utilized: None  Sit to stand: SBA Stand to sit: SBA Chair to chair: SBA  GAIT: Gait pattern: step through pattern, decreased arm swing- Left, decreased stance time- Left, decreased stride length, decreased hip/knee flexion- Left, and wide BOS Distance walked: various clinic distances Assistive device utilized: Single point cane Level of assistance:  SBA Comments: Functional weakness of LLE noted during gait, pt uses SPC correctly on contralateral side with good sequencing.  FUNCTIONAL TESTS:  5 times sit to stand: 41.81 seconds w/ hands on knees Timed up and go (TUG): 22.47 seconds w/ SPC 10 meter walk test: 14.66 seconds w/ SPC = 0.68 m/sec OR 2.25 ft/sec Dynamic Gait Index: To be assessed.  PATIENT SURVEYS:  None relevant to CC.  TODAY'S TREATMENT:                                                                                                                              DATE: 12/16/2022 -DGI:  [facility] PT Assessment - 12/16/22 1412       Standardized Balance Assessment   Standardized Balance Assessment Dynamic Gait Index      Dynamic Gait Index   Level Surface Mild Impairment    Change in Gait Speed Moderate Impairment    Gait with Horizontal Head Turns Mild Impairment    Gait with Vertical Head Turns Moderate Impairment    Gait and Pivot Turn Mild Impairment    Step Over Obstacle Moderate Impairment    Step Around Obstacles Mild Impairment    Steps Moderate Impairment    Total Score 12    DGI comment: 12/24 = high fall risk            You Can Walk For A Certain Length Of Time Each Day                          Walk 2 minutes 3-4 times per day.             Increase 1-2  minutes every 7 days              Work up to 20 minutes (1 times per day).               Example:  Day 1-2           4-5 minutes     3 times per day                         Day 7-8           10-12 minutes 2-3 times per day                         Day 13-14       20-22 minutes 1-2 times per day  Initial HEP: -STS x10 w/ BUE support -Heel raises 2x8 w/ eccentric lower; pt requiring prolonged standing rest b/w sets  PATIENT EDUCATION: Education details: Walking program, DGI results/interpretation/areas of improvement, and initial HEP.  Discussed walking w/ family using cane as needed and doing so during cooler hours of the  day.  Provided verbal info on obtaining cooling vest from [employer] as desired. Person educated: Patient Education method: Explanation Education comprehension: verbalized understanding  HOME EXERCISE PROGRAM: You Can Walk For A Certain Length Of Time Each Day                          Walk 2 minutes 3-4 times per day.             Increase 1-2  minutes every 7 days              Work up to 20 minutes (1 times per day).               Example:                         Day 1-2           4-5 minutes     3 times per day                         Day 7-8           10-12 minutes 2-3 times per day                         Day 13-14       20-22 minutes 1-2 times per day   Access Code: [facility] URL: [email or web address] Date: 12/16/2022 Prepared by: Camille Bal  Exercises - Proper Sit to Stand Technique  - 1 x daily - 7 x weekly - 1-2 sets - 10 reps - Heel Raises with Counter Support  - 1 x daily - 7 x weekly - 2-3 sets - 6-8 reps  GOALS: Goals reviewed with patient? Yes  SHORT TERM GOALS: Target date: 12/24/2022  Pt will be independent with strength and balance HEP for maintained progress in home setting. Baseline:  To be established. Goal status: INITIAL  2.  Pt will decrease 5xSTS to </=31.81 seconds w/o UE support in order to demonstrate decreased risk for falls and improved functional bilateral LE strength and power. Baseline: 41.81 seconds w/ hands on knees Goal status: INITIAL  3.  Pt will demonstrate TUG of </=15 seconds in order to decrease risk of falls and improve functional mobility using LRAD. Baseline:  22.47 seconds w/ SPC Goal status: INITIAL  4.  Pt will demonstrate a gait speed of >/=2.65 feet/sec in order to decrease risk for falls using LRAD. Baseline: 2.25 ft/sec w/ [facility]  Goal status: INITIAL  5.  DGI to be assessed w/ STG/LTG set as appropriate. Baseline: LTG only set due to length of POC and upcoming goal assessment. Goal status:  REVISED-5/16  LONG TERM GOALS: Target date: 01/21/2023  Patient will manage 14 stairs using left rail OR SPC at ModI level to improve safety with accessing home environment. Baseline: reports needing varying SBA-minA at home Goal status: INITIAL  2.  Pt will decrease 5xSTS to </=21.81 seconds w/o UE support in order to demonstrate decreased risk for falls and improved functional bilateral LE strength and power. Baseline: 41.81 seconds w/ hands on knees Goal status: INITIAL  3.  Pt will demonstrate a gait speed of >/=3.05 feet/sec in order to decrease risk for falls using LRAD. Baseline: 2.25 ft/sec w/ SPC Goal status: INITIAL  4.  Pt will improve DGI score to >/=16/24 in order to demonstrate improved balance and decreased fall risk. Baseline: 12/24 (5/16) Goal status: INITIAL  5.  Walking program to be established using LRAD as necessary for safety to encourage patient to maintain strength and endurance upon discharge. Baseline: To be established. Goal status: INITIAL  ASSESSMENT:  CLINICAL IMPRESSION: Emphasis of skilled physical therapy session on assessing DGI and establishing initial HEP.  Pt somewhat limited by fatigue with general activity this session.  She scores 12/24 on the DGI indicating high dynamic fall risk with most difficulty with changing gait speed, vertical head movements, and obstacle navigation including stair management.  Established introductory walking program to gradually improve tolerance to upright mobility.  Will continue per POC.  OBJECTIVE IMPAIRMENTS: Abnormal gait, decreased activity tolerance, decreased balance, decreased endurance, decreased knowledge of use of DME, difficulty walking, and decreased strength.   ACTIVITY LIMITATIONS: carrying, lifting, bending, squatting, stairs, transfers, locomotion level, and caring for others  PARTICIPATION LIMITATIONS: meal prep, cleaning, laundry, and community activity  PERSONAL FACTORS: Fitness, Past/current  experiences, Time since onset of injury/illness/exacerbation, and 1 comorbidity: RRMS  are also affecting patient's functional outcome.   REHAB POTENTIAL: Good  CLINICAL DECISION MAKING: Evolving/moderate complexity  EVALUATION COMPLEXITY: Moderate  PLAN:  PT FREQUENCY: 1x/week (pt preference)  PT DURATION: 8 weeks  PLANNED INTERVENTIONS: Therapeutic exercises, Therapeutic activity, Neuromuscular re-education, Balance training, Gait training, Patient/Family education, Self Care, Stair training, Vestibular training, DME instructions, Manual therapy, and Re-evaluation  PLAN FOR NEXT SESSION: ASSESS STGs, modify HEP prn for LLE NMR/strength, stair training w/ left rail (home setup) or SPC, Blaze pods for reaction time, SciFit for tolerance, head movements static and dynamically  Check all possible CPT codes: 16109 - PT Re-evaluation, 97110- Therapeutic Exercise, [phone]- Neuro Re-education, [phone] - Gait Training, [phone] - Manual Therapy, 97530 - Therapeutic Activities, and 97535 - Self Care    Check all conditions that are expected to impact treatment: Neurological condition and/or seizures   If treatment provided at initial evaluation, no treatment charged due to lack of authorization.    Sadie Haber, PT, DPT 12/16/2022, 2:47 PM

## 2022-12-16 NOTE — Patient Instructions (Addendum)
You Can Walk For A Certain Length Of Time Each Day                          Walk 2 minutes 3-4 times per day.             Increase 1-2  minutes every 7 days              Work up to 20 minutes (1 times per day).               Example:                         Day 1-2           4-5 minutes     3 times per day                         Day 7-8           10-12 minutes 2-3 times per day                         Day 13-14       20-22 minutes 1-2 times per day  Access Code: [facility] URL: [email or web address] Date: 12/16/2022 Prepared by: Camille Bal  Exercises - Proper Sit to Stand Technique  - 1 x daily - 7 x weekly - 1-2 sets - 10 reps - Heel Raises with Counter Support  - 1 x daily - 7 x weekly - 2-3 sets - 6-8 reps

## 2022-12-16 NOTE — Telephone Encounter (Signed)
I spoke with [facility] in infusion and she tried to reach the patient yesterday and LVM. She states she will return call this morning

## 2022-12-23 ENCOUNTER — Encounter: Payer: Self-pay | Admitting: Physical Therapy

## 2022-12-23 ENCOUNTER — Ambulatory Visit: Payer: 59 | Admitting: Physical Therapy

## 2022-12-23 DIAGNOSIS — R2681 Unsteadiness on feet: Secondary | ICD-10-CM

## 2022-12-23 DIAGNOSIS — M6281 Muscle weakness (generalized): Secondary | ICD-10-CM

## 2022-12-23 DIAGNOSIS — R2689 Other abnormalities of gait and mobility: Secondary | ICD-10-CM

## 2022-12-23 NOTE — Therapy (Signed)
OUTPATIENT PHYSICAL THERAPY NEURO TREATMENT   Patient Name: Denise Moses MRN: [id number] DOB:March 23, 1981, 42 y.o., female Today's Date: 12/23/2022   PCP: Ivonne Andrew, NP REFERRING PROVIDER: Levert Feinstein, MD  END OF SESSION:  PT End of Session - 12/23/22 1405     Visit Number 3    Number of Visits 9   8 + eval   Date for PT Re-Evaluation 01/28/23   pushed out due to potential need for re-scheduling   Authorization Type [place] MEDICAID PREPAID HEALTH PLAN    PT Start Time 1404    PT Stop Time 1445    PT Time Calculation (min) 41 min    Equipment Utilized During Treatment Gait belt    Activity Tolerance Patient tolerated treatment well;Patient limited by fatigue    Behavior During Therapy WFL for tasks assessed/performed            Past Medical History:  Diagnosis Date   Abnormal MRI, cervical spine    Anemia    Elective abortion    Heart murmur    Herpes    Infection    UTI   Multiple sclerosis (HCC)    Pregnancy induced hypertension    2001   Vitamin D deficiency    Past Surgical History:  Procedure Laterality Date   CESAREAN SECTION N/A 03/23/2020   Procedure: CESAREAN SECTION;  Surgeon: Tilda Burrow, MD;  Location: [facility];  Service: Obstetrics;  Laterality: N/A;  Arrest of Dilation   CHOLECYSTECTOMY     LAPAROSCOPY N/A 06/09/2016   Procedure: LAPAROSCOPY OPERATIVE;  Surgeon: Tereso Newcomer, MD;  Location: [facility];  Service: Gynecology;  Laterality: N/A;  ectopic    THERAPEUTIC ABORTION     x 4   TUBAL LIGATION     UNILATERAL SALPINGECTOMY Left 06/09/2016   Procedure: UNILATERAL SALPINGECTOMY, REMOVAL OF IUD;  Surgeon: Tereso Newcomer, MD;  Location: [facility];  Service: Gynecology;  Laterality: Left;   WISDOM TOOTH EXTRACTION     Patient Active Problem List   Diagnosis Date Noted   Urinary urgency 11/04/2022   Gait abnormality 11/04/2022   Other fatigue 11/04/2022   Anemia 11/04/2022   Relapsing remitting multiple sclerosis (HCC) 08/26/2022    Neuropathic pain 09/11/2020   Cesarean delivery, delivered, current hospitalization 03/23/2020   Alpha thalassemia silent carrier 12/18/2019   AMA (advanced maternal age) multigravida 35+ 09/17/2019   Maternal obesity affecting pregnancy, antepartum 09/17/2019   Chronic hypertension affecting pregnancy 09/17/2019   Supervision of other normal pregnancy, antepartum 09/10/2019   GBS bacteriuria 08/19/2019   Multiple sclerosis (HCC) 04/30/2019   Cervical myelopathy (HCC) 04/12/2019   Paresthesia 04/12/2019   Vitamin D deficiency 07/05/2017   Iron deficiency anemia 07/05/2017   Herpes simplex antibody positive 04/15/2016   Recurrent boils 04/13/2016   Family history of breast cancer in first degree relative 04/13/2016   Smoker 07/03/2014    ONSET DATE: Diagnosed w/ RRMS in 2020  REFERRING DIAG: G35 (ICD-10-CM) - Relapsing remitting multiple sclerosis R26.9 (ICD-10-CM) - Gait abnormality R39.15 (ICD-10-CM) - Urinary urgency R53.83 (ICD-10-CM) - Other fatigue D64.9 (ICD-10-CM) - Anemia, unspecified type  THERAPY DIAG:  Other abnormalities of gait and mobility  Muscle weakness (generalized)  Unsteadiness on feet  Rationale for Evaluation and Treatment: Rehabilitation  SUBJECTIVE:  SUBJECTIVE STATEMENT: She states her first infusion was pushed to next Thursday.  She has been trying to get out with family more and her kids have been pushing her to do her HEP so she is feeling some better.  She questions if she should have made appts in the morning vs the afternoon and PT instructs pt on how to go about adjusting appts with front office if she would like. Pt accompanied by: self-patient has just returned to driving  PERTINENT HISTORY: RRMS-to start Ocrevus, tobacco use, HSV  PAIN:  Are you having  pain? No  PRECAUTIONS: Fall  WEIGHT BEARING RESTRICTIONS: No  FALLS: Has patient fallen in last 6 months? No  LIVING ENVIRONMENT: Lives with: lives with their family-2 adult children that live with her full time Lives in: House/apartment Stairs: Yes: Internal: 14 steps; on left going up Has following equipment at home: Single point cane, Walker - 4 wheeled, shower chair, and bed side commode  PLOF: Independent with household mobility with device, Independent with community mobility with device, Needs assistance with ADLs, Needs assistance with homemaking, and Needs assistance with transfers  PATIENT GOALS: "To let this cane go and all the assistance that I keep beside the bed."  OBJECTIVE:   DIAGNOSTIC FINDINGS: MRI of brain w/wo in Feb 2024. Progressive changes of demyelination affecting the brain as outlined above, when compared to the most recent study of 10/02/2020. No lesions show restricted diffusion or contrast enhancement however. New lesions are seen in the right temporal lobe, adjacent to the superior margin of the body of the right lateral ventricle, and within the deep white matter of the left parietal and occipital lobe. New lesions are slightly more conspicuous on the sagittal cube FLAIR imaging, more so than the axial standard FLAIR imaging.   MRI thoracic spine w/wo 1. No evidence of demyelination in the thoracic region. 2. Minimal, non-compressive disc bulges from T7-8 through T11-12.   MRI of lumbar in Feb 2024 1. Normal MRI appearance of the conus medullaris and nerve roots of the cauda equina. No evidence for cord compression. 2. Left foraminal to extraforaminal disc protrusion at L3-4, contacting the exiting left L3 nerve root. 3. Additional minimal noncompressive disc bulging at L4-5 and L5-S1 without stenosis or impingement. 4. Mild bilateral facet hypertrophy throughout the lumbar spine.   Pt has MRI of cervical spine scheduled  11/24/2022.  COGNITION: Overall cognitive status: Within functional limits for tasks assessed and pt reports feeling her speech and thinking is "delayed". -pt does not desire ST referral at this time as she feels this will resolve with time.   SENSATION: Light touch: WFL  COORDINATION: LE RAMS:  slow, but coordinated Bilateral Heel-to-shin:  WFL  EDEMA:  None significant in BLE  MUSCLE TONE: None noted in BLE during functional assessments.  POSTURE: No Significant postural limitations  LOWER EXTREMITY ROM:     Active  Right Eval Left Eval  Hip flexion [facility]  Hip extension    Hip abduction    Hip adduction " "  Hip internal rotation    Hip external rotation " "  Knee flexion " "  Knee extension " "  Ankle dorsiflexion " "  Ankle plantarflexion " "  Ankle inversion    Ankle eversion     (Blank rows = not tested)  LOWER EXTREMITY MMT:    MMT Right Eval Left Eval  Hip flexion 4/5 3+/5  Hip extension    Hip abduction    Hip adduction  4/5 3/5  Hip internal rotation    Hip external rotation    Knee flexion    Knee extension 4+/5 3+/5  Ankle dorsiflexion 5/5 4/5  Ankle plantarflexion    Ankle inversion    Ankle eversion    (Blank rows = not tested)  BED MOBILITY:  Sit to supine Modified independence and Mod A Supine to sit Modified independence and Mod A Varies depending on the day and weather per report, pt states some days she can use the chair next to her bed as a bed rail and get up herself.  TRANSFERS: Assistive device utilized: None  Sit to stand: SBA Stand to sit: SBA Chair to chair: SBA  GAIT: Gait pattern: step through pattern, decreased arm swing- Left, decreased stance time- Left, decreased stride length, decreased hip/knee flexion- Left, and wide BOS Distance walked: various clinic distances Assistive device utilized: Single point cane Level of assistance: SBA Comments: Functional weakness of LLE noted during gait, pt uses SPC correctly on  contralateral side with good sequencing.  FUNCTIONAL TESTS:  5 times sit to stand: 41.81 seconds w/ hands on knees Timed up and go (TUG): 22.47 seconds w/ SPC 10 meter walk test: 14.66 seconds w/ SPC = 0.68 m/sec OR 2.25 ft/sec Dynamic Gait Index: To be assessed.  PATIENT SURVEYS:  None relevant to CC.  TODAY'S TREATMENT:                                                                                                                              DATE: 12/23/2022 -Verbally reviewed HEP and walking program, pt does not need additional print and states independence w/ children's encouragement to perform each day. -5xSTS:  34.04 seconds w/ hands on knees -TUG w/ SPC:  15.22 seconds w/ SPC - w/ SPC:  13.53 seconds = 0.74 m/sec OR 2.44 ft/sec  RAMP:  Level of Assistance: SBA Assistive device utilized: Single point cane Ramp Comments: Performed x 1 w/ single instruction for performance, pt does well pacing especially on decline, no LOB.  CURB:  Level of Assistance: SBA and CGA Assistive device utilized: Single point cane Curb Comments: Performed x3, cues to sequence task similar to stairs ("up w/ the good LE, down w/ the bad LE"), single instance of CGA on initial attempt at descent using RLE leading w/ improvement to SBA on second attempt.  STAIRS:  Level of Assistance: SBA  Stair Negotiation Technique: Step to Pattern Forwards With use of AD: SPC  with Single Rail on Left  Number of Stairs: 5x4   Height of Stairs: 6"  Comments: Cues for sequencing to improve stability.  Discussed safety w/ use of cane for improved fear of falling as pt rates this a 6-7/10 on stairs especially descent.  Discussed using cane at all times to practice stairs at home instead of holding son's shoulders.  -Forward tandem w/ unilateral countertop support 6x10', pt reports little challenge w/ task -Forward walking march unsupported 4x10',  pt reports increased challenge w/ task so added to HEP -SLS alt LE  2x30 seconds, PT facilitates hip abductor engagement w/ edu on trendelenburg deficit; added to HEP as pt reports challenge  PATIENT EDUCATION: Education details: Continue walking program and HEP with additions. Person educated: Patient Education method: Explanation Education comprehension: verbalized understanding  HOME EXERCISE PROGRAM: You Can Walk For A Certain Length Of Time Each Day                          Walk 2 minutes 3-4 times per day.             Increase 1-2  minutes every 7 days              Work up to 20 minutes (1 times per day).               Example:                         Day 1-2           4-5 minutes     3 times per day                         Day 7-8           10-12 minutes 2-3 times per day                         Day 13-14       20-22 minutes 1-2 times per day   Access Code: [facility] URL: [email or web address] Date: 12/16/2022 Prepared by: Camille Bal  Exercises - Proper Sit to Stand Technique  - 1 x daily - 7 x weekly - 1-2 sets - 10 reps - Heel Raises with Counter Support  - 1 x daily - 7 x weekly - 2-3 sets - 6-8 reps - Standing Single Leg Stance with Counter Support  - 1 x daily - 7 x weekly - 1 sets - 2 reps -  30 seconds hold - Walking March  - 1 x daily - 7 x weekly - 3 sets - 10 reps  GOALS: Goals reviewed with patient? Yes  SHORT TERM GOALS: Target date: 12/24/2022  Pt will be independent with strength and balance HEP for maintained progress in home setting. Baseline:  Pt independent with HEP per report (5/23) Goal status: MET  2.  Pt will decrease 5xSTS to </=31.81 seconds w/o UE support in order to demonstrate decreased risk for falls and improved functional bilateral LE strength and power. Baseline: 41.81 seconds w/ hands on knees; 34.04 seconds w/ hands on knees (5/23) Goal status: IN PROGRESS  3.  Pt will demonstrate TUG of </=15 seconds in order to decrease risk of falls and improve functional mobility using  LRAD. Baseline:  22.47 seconds w/ SPC; 15.22 seconds w/ SPC Goal status: IN PROGRESS  4.  Pt will demonstrate a gait speed of >/=2.65 feet/sec in order to decrease risk for falls using LRAD. Baseline: 2.25 ft/sec w/ SPC; 2.44 ft/sec w/ SPC (5/23) Goal status: IN PROGRESS  5.  DGI to be assessed w/ STG/LTG set as appropriate. Baseline: LTG only set due to length of POC and upcoming goal assessment. Goal status: REVISED-5/16  LONG TERM GOALS: Target date: 01/21/2023  Patient will manage 14 stairs using left rail OR SPC at ModI level to improve  safety with accessing home environment. Baseline: reports needing varying SBA-minA at home Goal status: INITIAL  2.  Pt will decrease 5xSTS to </=21.81 seconds w/o UE support in order to demonstrate decreased risk for falls and improved functional bilateral LE strength and power. Baseline: 41.81 seconds w/ hands on knees Goal status: INITIAL  3.  Pt will demonstrate a gait speed of >/=3.05 feet/sec in order to decrease risk for falls using LRAD. Baseline: 2.25 ft/sec w/ SPC Goal status: INITIAL  4.  Pt will improve DGI score to >/=16/24 in order to demonstrate improved balance and decreased fall risk. Baseline: 12/24 (5/16) Goal status: INITIAL  5.  Walking program to be established using LRAD as necessary for safety to encourage patient to maintain strength and endurance upon discharge. Baseline: To be established. Goal status: INITIAL  ASSESSMENT:  CLINICAL IMPRESSION: Emphasis of skilled session on assessment of STGs and despite missed appts pt has shown uptick in activity outside the clinic and progress towards all goals this visit.  Additions made to HEP to further challenge hip strength, balance in SLS, and tolerance to movement.  Her 5xSTS improved to 34.04 seconds, but she still requires hand support.  Her TUG progressed to 15.22 seconds using a SPC demonstrating trend towards a lower fall risk.  She ambulates at a speed of 2.44 ft/sec  which is significantly improved from 2.25 ft/sec on evaluation with continued use of SPC.  She continues to benefit from skilled PT to progress towards LTGs and independence with all upright mobility as tolerated.  OBJECTIVE IMPAIRMENTS: Abnormal gait, decreased activity tolerance, decreased balance, decreased endurance, decreased knowledge of use of DME, difficulty walking, and decreased strength.   ACTIVITY LIMITATIONS: carrying, lifting, bending, squatting, stairs, transfers, locomotion level, and caring for others  PARTICIPATION LIMITATIONS: meal prep, cleaning, laundry, and community activity  PERSONAL FACTORS: Fitness, Past/current experiences, Time since onset of injury/illness/exacerbation, and 1 comorbidity: RRMS  are also affecting patient's functional outcome.   REHAB POTENTIAL: Good  CLINICAL DECISION MAKING: Evolving/moderate complexity  EVALUATION COMPLEXITY: Moderate  PLAN:  PT FREQUENCY: 1x/week (pt preference)  PT DURATION: 8 weeks  PLANNED INTERVENTIONS: Therapeutic exercises, Therapeutic activity, Neuromuscular re-education, Balance training, Gait training, Patient/Family education, Self Care, Stair training, Vestibular training, DME instructions, Manual therapy, and Re-evaluation  PLAN FOR NEXT SESSION: modify HEP prn for LLE NMR/strength, stair training w/ left rail (home setup) or SPC, Blaze pods for reaction time, SciFit for tolerance, head movements static and dynamically, begin working away from [facility]  Check all possible CPT codes: 16109 - PT Re-evaluation, 97110- Therapeutic Exercise, [phone]- Neuro Re-education, [phone] - Gait Training, [phone] - Manual Therapy, 97530 - Therapeutic Activities, and 97535 - Self Care    Check all conditions that are expected to impact treatment: Neurological condition and/or seizures   If treatment provided at initial evaluation, no treatment charged due to lack of authorization.   Sadie Haber, PT, DPT 12/23/2022, 2:47  PM

## 2022-12-23 NOTE — Patient Instructions (Signed)
Access Code: [facility] URL: [email or web address] Date: 12/23/2022 Prepared by: Camille Bal  Exercises - Proper Sit to Stand Technique  - 1 x daily - 7 x weekly - 1-2 sets - 10 reps - Heel Raises with Counter Support  - 1 x daily - 7 x weekly - 2-3 sets - 6-8 reps - Standing Single Leg Stance with Counter Support  - 1 x daily - 7 x weekly - 1 sets - 2 reps -  30 seconds hold - Walking March  - 1 x daily - 7 x weekly - 3 sets - 10 reps

## 2022-12-30 ENCOUNTER — Ambulatory Visit: Payer: 59 | Admitting: Physical Therapy

## 2022-12-30 DIAGNOSIS — G35 Multiple sclerosis: Secondary | ICD-10-CM | POA: Diagnosis not present

## 2022-12-31 ENCOUNTER — Ambulatory Visit: Payer: 59 | Admitting: Physical Therapy

## 2023-01-06 ENCOUNTER — Ambulatory Visit: Payer: Medicaid Other | Attending: Neurology | Admitting: Physical Therapy

## 2023-01-06 ENCOUNTER — Encounter: Payer: Self-pay | Admitting: Physical Therapy

## 2023-01-06 ENCOUNTER — Telehealth: Payer: Self-pay | Admitting: Physical Therapy

## 2023-01-06 NOTE — Telephone Encounter (Signed)
Patient has had 3 no shows to physical therapy. Attempted to call to discharge but patient phone number not working. Will go ahead and D/C patient. She will require new referral in order to return to PT.   Maryruth Eve, PT, DPT

## 2023-01-06 NOTE — Therapy (Signed)
[facility] [address] [place], [place], [address] Phone: [phone]   Fax:  [phone]  Patient Details  Name: Denise Moses MRN: [id number] Date of Birth: Aug 23, 1980 Referring Provider:  Levert Feinstein, MD Encounter Date: 01/06/2023  PHYSICAL THERAPY DISCHARGE SUMMARY  Visits from Start of Care: 3  Current functional level related to goals / functional outcomes: Unable to assess, not returned since last visit   Remaining deficits: Unable to assess, not returned since last visit   Education / Equipment: Will follow up with PCP of need for new referral if patient wishes to return as unable to get through call line   Patient agrees to discharge. Patient goals were not met. Patient is being discharged due to not returning since the last visit.  Carmelia Bake, PT, DPT 01/06/2023, 4:02 PM  [facility] [address] [place], [place], [address] Phone: [phone]   Fax:  [phone]

## 2023-01-13 ENCOUNTER — Ambulatory Visit: Payer: Medicaid Other | Admitting: Physical Therapy

## 2023-01-13 DIAGNOSIS — G35 Multiple sclerosis: Secondary | ICD-10-CM | POA: Diagnosis not present

## 2023-01-20 ENCOUNTER — Ambulatory Visit: Payer: Medicaid Other | Admitting: Physical Therapy

## 2023-05-09 NOTE — Patient Instructions (Signed)

## 2023-05-09 NOTE — Progress Notes (Unsigned)
No chief complaint on file.   HISTORY OF PRESENT ILLNESS:  05/09/23 ALL:  Denise Moses is a 42 y.o. female here today for follow up for RRMS. She was last seen by Dr Terrace Arabia 11/2022 after relapse. She had been lost to follow up since 2022. Previously on Tecfidera but after relapse 09/2022, she was switched to Briumvi. Multiple new lesions on MRI brain, no new lesions on cervical imaging. She was started on Cymbalta for pain management. Vitamin D was 11.6 and Ferratin 12. Supplements prescribed at that time.   Since,   Duloxetine  Vitamin D  Tandem Plus?    HISTORY (copied from Dr Zannie Cove previous note)  Denise Moses is 42 year old female, seen in request by orthopedic surgeon Dr. Margarita Rana for evaluation of possible multiple sclerosis, her primary care physician is PA Arrie Senate, she is accompanied by her significant others at today's clinic visit on Sept 20, 2020   I have reviewed and summarized the referring note from the referring physician.  She is currently [redacted] weeks pregnant, per patient, she is going to have a elective abortion,   In July 2020, she had a sudden onset neck pain, radiating pain to bilateral shoulder, bilateral fingertips mainly 2, 3, fourth fingertips, also developed mild weak grip, at the peak of her difficulty, she also described difficulty walking, urinary urgency, incontinence, she was given a prescription of steroid, and gabapentin, which has helped her symptoms some, but still not back to her baseline, she still has bilateral upper extremity paresthesia, no longer have significant gait abnormality, lower extremity paresthesia, she denies visual loss.   I personally reviewed MRI of cervical spine on February 27, 2019, signal abnormality within the cord at C2,   UPDATE Sept 30 2020: She is scheduled for abortion on May 04, 2019, there is no new neurological symptoms,   We personally reviewed MRI of the brain without contrast April 29, 2019, multiple T2/flair hyperintensity in the pons, hemisphere, consistent with relapsing remitting multiple sclerosis,   Laboratory evaluation on April 12, 2019 showed decreased vitamin D 14.4, normal or negative angiotensin-converting enzyme level, ANA, 10, iron level, copper level was mildly elevated 225, hepatitis panel, A1c was 5.5, normal electrophoresis CPK was 24, ESR was 112, normal TSH, C-reactive protein was mildly elevated 11, normal folic acid, HIV, RPR, B12,   UPDATE Jun 12 2019: She had a elective abortion C-section on May 04, 2019, we also called her primary care physician, she has lost the follow-up since 2018, missed multiple scheduled visit, was actually discharged by her primary care physician   She complains of worsening bilateral lower extremity swelling, burning pain, denies significant gait abnormality   We had extensive discussion with patient, and her significant others, she preferred to started ocrelizumab, emphasized with patient the importance to stay on contraceptives, will refer her to OB/GYN for options.   UPDATE Sep 11 2020: She was pregnant again, never started Ocrelizumab as scheduled. She had a C-section with healthy baby on March 23 2021, she is no longer breast-feeding, she , does complains of intermittent blurry vision, seems to noticed more difficulty walking post pattern, she denies falling bladder incontinence, she is now ready to receive treatment, had tubal ligation during C-section   We again personally reviewed MRI of the brain without contrast September 2020, multiple T2/flair hyperintensity foci in the pons, hemisphere, also with C2 involvement   UPDATE October 09 2020: She started on Tecfidera since Feb 2022. She complains  of intermittent right-sided throbbing pain, gabapentin 300 mg 3 times daily works well, mild unsteady gait, continue have urinary urgency, but much improved   She is tolerating Tecfidera, with some flash, denies  significant GI side effect, insurance will only pay ocrelizumab as second tier,   We personally reviewed MRI of brain, cervical with without contrast in March 2022, compared to previous scan, multiple T2/FLAIR hyperintensity foci in cerebral hemisphere, consistent with chronic demyelinating plaque, compared to previous scan in 2020, there was 2-3 new supratentorium lesions, no contrast-enhancement.  MRI of cervical spine showed lesion within the central posterior spinal cord adjacent to C2, no significant canal foraminal narrowing,    UPDATE November 04 2022: She lost follow up since 2022, stop Tecfidera since 2022.  She is accompanied by her daughter at today's clinical visit, She has relapse in Feb 2024, she has trouble walking, urinary and bowel incontinence, did not do well for one month, could not walk,  multiple ER visit, was given solumedrol and  prednisone tapering, now has some improvement, ambulate with cane.  This flareup has really alarmed her, she wants to continue her neurological care and treatments     Personally reviewed MRI of brain w/wo in Feb 2024. Progressive changes of demyelination affecting the brain as outlined above, when compared to the most recent study of 10/02/2020. No lesions show restricted diffusion or contrast enhancement however. New lesions are seen in the right temporal lobe, adjacent to the superior margin of the body of the right lateral ventricle, and within the deep white matter of the left parietal and occipital lobe. New lesions are slightly more conspicuous on the sagittal cube FLAIR imaging, more so than the axial standard FLAIR imaging.   MRI thoracic spine w/wo 1. No evidence of demyelination in the thoracic region. 2. Minimal, non-compressive disc bulges from T7-8 through T11-12.   MRI of lumbar in Feb 2024 1. Normal MRI appearance of the conus medullaris and nerve roots of the cauda equina. No evidence for cord compression. 2. Left foraminal to  extraforaminal disc protrusion at L3-4, contacting the exiting left L3 nerve root. 3. Additional minimal noncompressive disc bulging at L4-5 and L5-S1 without stenosis or impingement. 4. Mild bilateral facet hypertrophy throughout the lumbar spine.   REVIEW OF SYSTEMS: Out of a complete 14 system review of symptoms, the patient complains only of the following symptoms, and all other reviewed systems are negative.   ALLERGIES: No Known Allergies   HOME MEDICATIONS: Outpatient Medications Prior to Visit  Medication Sig Dispense Refill   DULoxetine (CYMBALTA) 30 MG capsule Take 1 capsule (30 mg total) by mouth daily. 30 capsule 0   DULoxetine (CYMBALTA) 60 MG capsule Take 1 capsule (60 mg total) by mouth daily. 90 capsule 3   FeFum-FePo-FA-B Cmp-C-Zn-Mn-Cu (TANDEM PLUS) 162-115.2-1 MG CAPS Take 1 tablet by mouth daily. 90 capsule 3   naproxen (NAPROSYN) 500 MG tablet Take 500 mg by mouth 2 (two) times daily with a meal.     NIFEdipine (PROCARDIA XL/NIFEDICAL-XL) 90 MG 24 hr tablet Take 1 tablet (90 mg total) by mouth daily. (Patient not taking: Reported on 04/28/2021) 90 tablet 3   ondansetron (ZOFRAN) 4 MG tablet Take 2 tablets (8 mg total) by mouth every 8 (eight) hours as needed for nausea or vomiting. 12 tablet 0   oxybutynin (DITROPAN XL) 10 MG 24 hr tablet Take 1 tablet (10 mg total) by mouth at bedtime. 30 tablet 11   potassium chloride SA (KLOR-CON M) 20 MEQ  tablet Take 1 tablet (20 mEq total) by mouth 2 (two) times daily for 3 days. 6 tablet 0   predniSONE (DELTASONE) 10 MG tablet Take 10 mg by mouth daily with breakfast.     Vitamin D, Ergocalciferol, 50000 units CAPS Take 1 tablet by mouth once a week. 10 capsule 0   No facility-administered medications prior to visit.     PAST MEDICAL HISTORY: Past Medical History:  Diagnosis Date   Abnormal MRI, cervical spine    Anemia    Elective abortion    Heart murmur    Herpes    Infection    UTI   Multiple sclerosis (HCC)     Pregnancy induced hypertension    2001   Vitamin D deficiency      PAST SURGICAL HISTORY: Past Surgical History:  Procedure Laterality Date   CESAREAN SECTION N/A 03/23/2020   Procedure: CESAREAN SECTION;  Surgeon: Tilda Burrow, MD;  Location: [facility];  Service: Obstetrics;  Laterality: N/A;  Arrest of Dilation   CHOLECYSTECTOMY     LAPAROSCOPY N/A 06/09/2016   Procedure: LAPAROSCOPY OPERATIVE;  Surgeon: Tereso Newcomer, MD;  Location: [facility];  Service: Gynecology;  Laterality: N/A;  ectopic    THERAPEUTIC ABORTION     x 4   TUBAL LIGATION     UNILATERAL SALPINGECTOMY Left 06/09/2016   Procedure: UNILATERAL SALPINGECTOMY, REMOVAL OF IUD;  Surgeon: Tereso Newcomer, MD;  Location: [facility];  Service: Gynecology;  Laterality: Left;   WISDOM TOOTH EXTRACTION       FAMILY HISTORY: Family History  Problem Relation Age of Onset   Breast cancer Mother 68       was a smoker, alcohol drinker in early life    Breast cancer Maternal Aunt    Cancer Maternal Grandfather        unsure of type   Other Father        unsure of history   Breast cancer Maternal Aunt      SOCIAL HISTORY: Social History   Socioeconomic History   Marital status: Single    Spouse name: Not on file   Number of children: 6   Years of education: some college   Highest education level: Not on file  Occupational History   Occupation: Unemployed  Tobacco Use   Smoking status: Former    Current packs/day: 0.00    Average packs/day: 0.3 packs/day for 10.0 years (2.5 ttl pk-yrs)    Types: Cigarettes    Start date: 03/2009    Quit date: 03/2019    Years since quitting: 4.1   Smokeless tobacco: Never   Tobacco comments:    2-3 cigs/day  Vaping Use   Vaping status: Never Used  Substance and Sexual Activity   Alcohol use: Not Currently    Comment: social drinker   Drug use: Yes    Frequency: 5.0 times per week    Types: Marijuana    Comment: last used before delivery   Sexual activity: Not Currently     Partners: Male    Birth control/protection: Surgical  Other Topics Concern   Not on file  Social History Narrative   Lives at home with children.   Right-handed.   No daily caffeine use.   Social Determinants of Health   Financial Resource Strain: Low Risk  (04/08/2020)   Overall Financial Resource Strain (CARDIA)    Difficulty of Paying Living Expenses: Not hard at all  Food Insecurity: No Food Insecurity (11/11/2022)  Hunger Vital Sign    Worried About Running Out of Food in the Last Year: Never true    Ran Out of Food in the Last Year: Never true  Transportation Needs: No Transportation Needs (11/11/2022)   PRAPARE - Administrator, Civil Service (Medical): No    Lack of Transportation (Non-Medical): No  Physical Activity: Insufficiently Active (05/20/2020)   Exercise Vital Sign    Days of Exercise per Week: 2 days    Minutes of Exercise per Session: 30 min  Stress: Stress Concern Present (04/08/2020)   [employer] of Occupational Health - Occupational Stress Questionnaire    Feeling of Stress : To some extent  Social Connections: Moderately Isolated (04/08/2020)   Social Connection and Isolation Panel [NHANES]    Frequency of Communication with Friends and Family: More than three times a week    Frequency of Social Gatherings with Friends and Family: More than three times a week    Attends Religious Services: More than 4 times per year    Active Member of [employer] or Organizations: No    Attends Banker Meetings: Never    Marital Status: Never married  Intimate Partner Violence: Not At Risk (11/11/2022)   Humiliation, Afraid, Rape, and Kick questionnaire    Fear of Current or Ex-Partner: No    Emotionally Abused: No    Physically Abused: No    Sexually Abused: No     PHYSICAL EXAM  There were no vitals filed for this visit. There is no height or weight on file to calculate BMI.  Generalized: Well developed, in no acute  distress  Cardiology: normal rate and rhythm, no murmur auscultated  Respiratory: clear to auscultation bilaterally    Neurological examination  Mentation: Alert oriented to time, place, history taking. Follows all commands speech and language fluent Cranial nerve II-XII: Pupils were equal round reactive to light. Extraocular movements were full, visual field were full on confrontational test. Facial sensation and strength were normal. Uvula tongue midline. Head turning and shoulder shrug  were normal and symmetric. Motor: The motor testing reveals 5 over 5 strength of all 4 extremities. Good symmetric motor tone is noted throughout.  Sensory: Sensory testing is intact to soft touch on all 4 extremities. No evidence of extinction is noted.  Coordination: Cerebellar testing reveals good finger-nose-finger and heel-to-shin bilaterally.  Gait and station: Gait is normal. Tandem gait is normal. Romberg is negative. No drift is seen.  Reflexes: Deep tendon reflexes are symmetric and normal bilaterally.    DIAGNOSTIC DATA (LABS, IMAGING, TESTING) - I reviewed patient records, labs, notes, testing and imaging myself where available.  Lab Results  Component Value Date   WBC 18.2 (H) 12/10/2022   HGB 8.3 (L) 12/10/2022   HCT 27.4 (L) 12/10/2022   MCV 72.7 (L) 12/10/2022   PLT 296 12/10/2022      Component Value Date/Time   NA 135 12/10/2022 1327   NA 137 11/04/2022 1157   K 2.9 (L) 12/10/2022 1327   CL 105 12/10/2022 1327   CO2 19 (L) 12/10/2022 1327   GLUCOSE 88 12/10/2022 1327   BUN 5 (L) 12/10/2022 1327   BUN 7 11/04/2022 1157   CREATININE 0.81 12/10/2022 1327   CREATININE 0.74 05/22/2015 1631   CALCIUM 8.9 12/10/2022 1327   PROT 7.3 12/10/2022 1327   PROT 7.8 11/04/2022 1157   ALBUMIN 3.5 12/10/2022 1327   ALBUMIN 4.3 11/04/2022 1157   ALBUMIN 4.3 06/18/2019 1142   AST  19 12/10/2022 1327   ALT 32 12/10/2022 1327   ALKPHOS 73 12/10/2022 1327   BILITOT 1.2 12/10/2022 1327    BILITOT 0.3 11/04/2022 1157   GFRNONAA >60 12/10/2022 1327   GFRAA 117 09/11/2020 0827   Lab Results  Component Value Date   CHOL 140 04/28/2021   HDL 54 04/28/2021   LDLCALC 65 04/28/2021   TRIG 119 04/28/2021   CHOLHDL 2.6 04/28/2021   Lab Results  Component Value Date   HGBA1C 5.3 04/28/2021   Lab Results  Component Value Date   VITAMINB12 318 11/04/2022   Lab Results  Component Value Date   TSH 1.230 11/04/2022        No data to display               No data to display           ASSESSMENT AND PLAN  42 y.o. year old female  has a past medical history of Abnormal MRI, cervical spine, Anemia, Elective abortion, Heart murmur, Herpes, Infection, Multiple sclerosis (HCC), Pregnancy induced hypertension, and Vitamin D deficiency. here with    No diagnosis found.  Jonni Sanger ***.  Healthy lifestyle habits encouraged. *** will follow up with PCP as directed. *** will return to see me in ***, sooner if needed. *** verbalizes understanding and agreement with this plan.   No orders of the defined types were placed in this encounter.    No orders of the defined types were placed in this encounter.    Shawnie Dapper, MSN, FNP-C 05/09/2023, 9:53 AM  [facility] [address], [address] [place], [place] [address] [phone]

## 2023-05-11 ENCOUNTER — Encounter: Payer: Self-pay | Admitting: Family Medicine

## 2023-05-11 ENCOUNTER — Ambulatory Visit (INDEPENDENT_AMBULATORY_CARE_PROVIDER_SITE_OTHER): Payer: Medicaid Other | Admitting: Family Medicine

## 2023-05-11 VITALS — BP 143/77 | HR 74 | Ht 66.0 in | Wt 210.0 lb

## 2023-05-11 DIAGNOSIS — D649 Anemia, unspecified: Secondary | ICD-10-CM | POA: Diagnosis not present

## 2023-05-11 DIAGNOSIS — R269 Unspecified abnormalities of gait and mobility: Secondary | ICD-10-CM | POA: Diagnosis not present

## 2023-05-11 DIAGNOSIS — R3915 Urgency of urination: Secondary | ICD-10-CM

## 2023-05-11 DIAGNOSIS — E559 Vitamin D deficiency, unspecified: Secondary | ICD-10-CM

## 2023-05-11 DIAGNOSIS — G35 Multiple sclerosis: Secondary | ICD-10-CM | POA: Diagnosis not present

## 2023-05-11 DIAGNOSIS — R5383 Other fatigue: Secondary | ICD-10-CM

## 2023-05-12 ENCOUNTER — Other Ambulatory Visit: Payer: Self-pay | Admitting: Family Medicine

## 2023-05-12 DIAGNOSIS — E559 Vitamin D deficiency, unspecified: Secondary | ICD-10-CM

## 2023-05-12 DIAGNOSIS — D509 Iron deficiency anemia, unspecified: Secondary | ICD-10-CM

## 2023-05-12 LAB — CBC WITH DIFFERENTIAL/PLATELET
Basophils Absolute: 0.1 10*3/uL (ref 0.0–0.2)
Basos: 1 %
EOS (ABSOLUTE): 0.1 10*3/uL (ref 0.0–0.4)
Eos: 1 %
Hematocrit: 29.9 % — ABNORMAL LOW (ref 34.0–46.6)
Hemoglobin: 8.8 g/dL — ABNORMAL LOW (ref 11.1–15.9)
Immature Grans (Abs): 0 10*3/uL (ref 0.0–0.1)
Immature Granulocytes: 0 %
Lymphocytes Absolute: 2.1 10*3/uL (ref 0.7–3.1)
Lymphs: 32 %
MCH: 21.5 pg — ABNORMAL LOW (ref 26.6–33.0)
MCHC: 29.4 g/dL — ABNORMAL LOW (ref 31.5–35.7)
MCV: 73 fL — ABNORMAL LOW (ref 79–97)
Monocytes Absolute: 0.4 10*3/uL (ref 0.1–0.9)
Monocytes: 6 %
Neutrophils Absolute: 3.9 10*3/uL (ref 1.4–7.0)
Neutrophils: 60 %
Platelets: 359 10*3/uL (ref 150–450)
RBC: 4.09 x10E6/uL (ref 3.77–5.28)
RDW: 16.3 % — ABNORMAL HIGH (ref 11.7–15.4)
WBC: 6.6 10*3/uL (ref 3.4–10.8)

## 2023-05-12 LAB — VITAMIN D 25 HYDROXY (VIT D DEFICIENCY, FRACTURES): Vit D, 25-Hydroxy: 18.3 ng/mL — ABNORMAL LOW (ref 30.0–100.0)

## 2023-05-12 LAB — COMPREHENSIVE METABOLIC PANEL
ALT: 6 [IU]/L (ref 0–32)
AST: 11 [IU]/L (ref 0–40)
Albumin: 4.4 g/dL (ref 3.9–4.9)
Alkaline Phosphatase: 70 [IU]/L (ref 44–121)
BUN/Creatinine Ratio: 11 (ref 9–23)
BUN: 8 mg/dL (ref 6–24)
Bilirubin Total: 0.4 mg/dL (ref 0.0–1.2)
CO2: 18 mmol/L — ABNORMAL LOW (ref 20–29)
Calcium: 9.3 mg/dL (ref 8.7–10.2)
Chloride: 104 mmol/L (ref 96–106)
Creatinine, Ser: 0.7 mg/dL (ref 0.57–1.00)
Globulin, Total: 3 g/dL (ref 1.5–4.5)
Glucose: 83 mg/dL (ref 70–99)
Potassium: 4.1 mmol/L (ref 3.5–5.2)
Sodium: 138 mmol/L (ref 134–144)
Total Protein: 7.4 g/dL (ref 6.0–8.5)
eGFR: 110 mL/min/{1.73_m2} (ref >59)

## 2023-05-12 LAB — IRON,TIBC AND FERRITIN PANEL
Ferritin: 6 ng/mL — ABNORMAL LOW (ref 15–150)
Iron Saturation: 5 % — CL (ref 15–55)
Iron: 19 ug/dL — ABNORMAL LOW (ref 27–159)
Total Iron Binding Capacity: 396 ug/dL (ref 250–450)
UIBC: 377 ug/dL (ref 131–425)

## 2023-05-12 LAB — IGG, IGA, IGM
IgA/Immunoglobulin A, Serum: 263 mg/dL (ref 87–352)
IgG (Immunoglobin G), Serum: 1604 mg/dL — ABNORMAL HIGH (ref 586–1602)
IgM (Immunoglobulin M), Srm: 189 mg/dL (ref 26–217)

## 2023-05-12 MED ORDER — VITAMIN D (ERGOCALCIFEROL) 50000 UNITS PO CAPS: 1.0000 | ORAL_CAPSULE | ORAL | 0 refills | Status: DC

## 2023-05-17 ENCOUNTER — Telehealth: Payer: Self-pay | Admitting: Family Medicine

## 2023-05-17 ENCOUNTER — Telehealth: Payer: Self-pay | Admitting: *Deleted

## 2023-05-17 NOTE — Telephone Encounter (Signed)
Referral for Hematology/Oncology. Phone: [phone], Fax: [phone]

## 2023-05-17 NOTE — Telephone Encounter (Signed)
Received notification in epic that pt did not read mychart message Amy Lomax,NP sent him about lab results on 05/12/23: " Hey there, Ms Tatlock! Your labs are back and I am still concerned of anemia with you. I am going to send a referral to hematology to see if they will help identify possible causes and treatment options. I do not think the supplements are helping enough. Your vitamin D is still low. I am going to call in a prescription of vitamin D that you will take once weekly for 13 weeks. After you complete the prescription, we can recheck your levels to see if they have improved. I will place orders for labs in our system and you can come by the office to have those drawn in about 3 months. Please listen for a call to schedule an appointment with hematology. Let me know if you need me! "  Amy Lomax,NP asked that we call pt to discuss instead. LVM for pt to call to go over results.

## 2023-05-18 ENCOUNTER — Telehealth: Payer: Self-pay | Admitting: Hematology and Oncology

## 2023-05-18 NOTE — Telephone Encounter (Signed)
Called and left VM for patient to call back to schedule an appointment.

## 2023-05-19 ENCOUNTER — Telehealth: Payer: Self-pay | Admitting: Hematology

## 2023-05-19 ENCOUNTER — Ambulatory Visit (INDEPENDENT_AMBULATORY_CARE_PROVIDER_SITE_OTHER): Payer: Medicaid Other | Admitting: Nurse Practitioner

## 2023-05-19 ENCOUNTER — Ambulatory Visit (HOSPITAL_COMMUNITY)
Admission: RE | Admit: 2023-05-19 | Discharge: 2023-05-19 | Disposition: A | Discharge status: Home / Self Care | Payer: Medicaid Other | Source: Ambulatory Visit | Attending: Nurse Practitioner | Admitting: Nurse Practitioner

## 2023-05-19 ENCOUNTER — Encounter: Payer: Self-pay | Admitting: Nurse Practitioner

## 2023-05-19 VITALS — BP 130/63 | HR 64 | Ht 66.0 in | Wt 212.0 lb

## 2023-05-19 DIAGNOSIS — M25561 Pain in right knee: Secondary | ICD-10-CM | POA: Diagnosis not present

## 2023-05-19 DIAGNOSIS — I10 Essential (primary) hypertension: Secondary | ICD-10-CM

## 2023-05-19 DIAGNOSIS — G8929 Other chronic pain: Secondary | ICD-10-CM | POA: Insufficient documentation

## 2023-05-19 DIAGNOSIS — M25562 Pain in left knee: Secondary | ICD-10-CM | POA: Insufficient documentation

## 2023-05-19 MED ORDER — PREDNISONE 20 MG PO TABS
20.0000 mg | ORAL_TABLET | Freq: Every day | ORAL | 0 refills | Status: DC
Start: 2023-05-19 — End: 2023-11-22

## 2023-05-19 MED ORDER — HYDROCHLOROTHIAZIDE 12.5 MG PO CAPS
12.5000 mg | ORAL_CAPSULE | Freq: Every day | ORAL | 2 refills | Status: AC
Start: 2023-05-19 — End: ?

## 2023-05-19 NOTE — Telephone Encounter (Signed)
Scheduled appointments per referral. Patient is aware of the appointment time and date as well as the address. Patient was informed to arrive 10-15 minutes prior with updated insurance information. All questions were answered.

## 2023-05-19 NOTE — Progress Notes (Signed)
Subjective   Patient ID: Denise Moses, female    DOB: 1981-07-03, 42 y.o.   MRN: [id number]  Chief Complaint  Patient presents with   Follow-up    Referring provider: Ivonne Andrew, NP  Denise Moses is a 42 y.o. female with Past Medical History: No date: Abnormal MRI, cervical spine No date: Anemia No date: Elective abortion No date: Heart murmur No date: Herpes No date: Infection     Comment:  UTI No date: Multiple sclerosis (HCC) No date: Pregnancy induced hypertension     Comment:  2001 No date: Vitamin D deficiency   HPI  Patient presents today with concerns of elevated blood pressure.  She states that her last couple doctor visits at different specialist and has been noted that her blood pressure was elevated.  We will start her on low-dose HCTZ today.  Also she is complaining of bilateral knee pain today.  She states that she does hear and feel a cracking and popping sensation when walking.  She does not notice significant swelling.  She has not had any recent injury.  We will check x-rays and trial prednisone.    No Known Allergies  Immunization History  Administered Date(s) Administered   Tdap 03/02/2020    Tobacco History: Social History   Tobacco Use  Smoking Status Former   Current packs/day: 0.00   Average packs/day: 0.3 packs/day for 10.0 years (2.5 ttl pk-yrs)   Types: Cigarettes   Start date: 03/2009   Quit date: 03/2019   Years since quitting: 4.2  Smokeless Tobacco Never  Tobacco Comments   2-3 cigs/day   Counseling given: Not Answered Tobacco comments: 2-3 cigs/day   Outpatient Encounter Medications as of 05/19/2023  Medication Sig   DULoxetine (CYMBALTA) 60 MG capsule Take 1 capsule (60 mg total) by mouth daily.   FeFum-FePo-FA-B Cmp-C-Zn-Mn-Cu (TANDEM PLUS) 162-115.2-1 MG CAPS Take 1 tablet by mouth daily.   hydrochlorothiazide (MICROZIDE) 12.5 MG capsule Take 1 capsule (12.5 mg total) by mouth daily.   naproxen  (NAPROSYN) 500 MG tablet Take 500 mg by mouth 2 (two) times daily with a meal.   ondansetron (ZOFRAN) 4 MG tablet Take 2 tablets (8 mg total) by mouth every 8 (eight) hours as needed for nausea or vomiting.   oxybutynin (DITROPAN XL) 10 MG 24 hr tablet Take 1 tablet (10 mg total) by mouth at bedtime.   predniSONE (DELTASONE) 20 MG tablet Take 1 tablet (20 mg total) by mouth daily with breakfast.   Vitamin D, Ergocalciferol, 50000 units CAPS Take 1 tablet by mouth once a week.   NIFEdipine (PROCARDIA XL/NIFEDICAL-XL) 90 MG 24 hr tablet Take 1 tablet (90 mg total) by mouth daily. (Patient not taking: Reported on 04/28/2021)   potassium chloride SA (KLOR-CON M) 20 MEQ tablet Take 1 tablet (20 mEq total) by mouth 2 (two) times daily for 3 days.   No facility-administered encounter medications on file as of 05/19/2023.    Review of Systems  Review of Systems  Constitutional: Negative.   HENT: Negative.    Cardiovascular: Negative.   Gastrointestinal: Negative.   Musculoskeletal:        Knee pain  Allergic/Immunologic: Negative.   Neurological: Negative.   Psychiatric/Behavioral: Negative.       Objective:   BP 130/63 (BP Location: Right Arm, Patient Position: Sitting, Cuff Size: Large)   Pulse 64   Ht 5\' 6"  (1.676 m)   Wt 212 lb (96.2 kg)   SpO2 98%  BMI 34.22 kg/m   Wt Readings from Last 5 Encounters:  05/19/23 212 lb (96.2 kg)  05/11/23 210 lb (95.3 kg)  11/04/22 190 lb (86.2 kg)  11/01/22 190 lb (86.2 kg)  09/06/22 190 lb (86.2 kg)     Physical Exam Vitals and nursing note reviewed.  Constitutional:      General: She is not in acute distress.    Appearance: She is well-developed.  Cardiovascular:     Rate and Rhythm: Normal rate and regular rhythm.  Pulmonary:     Effort: Pulmonary effort is normal.     Breath sounds: Normal breath sounds.  Musculoskeletal:     Right knee: Decreased range of motion. Tenderness present.     Left knee: Decreased range of motion.  Tenderness present.  Neurological:     Mental Status: She is alert and oriented to person, place, and time.       Assessment & Plan:   Chronic pain of both knees -     DG Knee Complete 4 Views Right -     DG Knee Complete 4 Views Left -     Ambulatory referral to Orthopedics  Primary hypertension -     hydroCHLOROthiazide; Take 1 capsule (12.5 mg total) by mouth daily.  Dispense: 90 capsule; Refill: 2  Other orders -     predniSONE; Take 1 tablet (20 mg total) by mouth daily with breakfast.  Dispense: 5 tablet; Refill: 0     Return in about 6 months (around 11/17/2023).   Ivonne Andrew, NP 05/19/2023

## 2023-05-19 NOTE — Patient Instructions (Signed)
1. Chronic pain of both knees  - DG Knee Complete 4 Views Right - DG Knee Complete 4 Views Left - AMB referral to orthopedics   2. Primary hypertension  - hydrochlorothiazide (MICROZIDE) 12.5 MG capsule; Take 1 capsule (12.5 mg total) by mouth daily.  Dispense: 90 capsule; Refill: 2   Follow up:  Follow up in 3 months

## 2023-05-24 NOTE — Telephone Encounter (Signed)
Called pt again at [phone] since no return call yet. She states she did receive Amy's mychart message. She picked up Vit D prescription and made appt with Hematology for next Monday for further evaluation

## 2023-05-26 ENCOUNTER — Ambulatory Visit: Payer: Medicaid Other | Admitting: Physician Assistant

## 2023-05-29 ENCOUNTER — Emergency Department (HOSPITAL_COMMUNITY): Payer: Medicaid Other

## 2023-05-29 ENCOUNTER — Other Ambulatory Visit: Payer: Self-pay

## 2023-05-29 ENCOUNTER — Emergency Department (HOSPITAL_COMMUNITY)
Admission: EM | Admit: 2023-05-29 | Discharge: 2023-05-29 | Disposition: A | Discharge status: Home / Self Care | Payer: Medicaid Other | Attending: Emergency Medicine | Admitting: Emergency Medicine

## 2023-05-29 DIAGNOSIS — S93401A Sprain of unspecified ligament of right ankle, initial encounter: Secondary | ICD-10-CM | POA: Insufficient documentation

## 2023-05-29 DIAGNOSIS — W108XXA Fall (on) (from) other stairs and steps, initial encounter: Secondary | ICD-10-CM | POA: Diagnosis not present

## 2023-05-29 DIAGNOSIS — S93491A Sprain of other ligament of right ankle, initial encounter: Secondary | ICD-10-CM | POA: Diagnosis not present

## 2023-05-29 DIAGNOSIS — S99911A Unspecified injury of right ankle, initial encounter: Secondary | ICD-10-CM | POA: Diagnosis present

## 2023-05-29 NOTE — ED Triage Notes (Signed)
Pt here POV with complaints of falling down some stairs on Friday afternoon. Pt states today pain has increased and endorses swelling to R foot/ankle.

## 2023-05-29 NOTE — Progress Notes (Signed)
Orthopedic Tech Progress Note Patient Details:  Denise Moses 1980-11-28 [id number]  Ortho Devices Type of Ortho Device: ASO, Crutches Ortho Device/Splint Location: RLE, crutches at bedside Ortho Device/Splint Interventions: Ordered, Application, Adjustment   Post Interventions Patient Tolerated: Well Instructions Provided: Poper ambulation with device, Care of device, Adjustment of device  [name] [name] 05/29/2023, 12:33 PM

## 2023-05-29 NOTE — ED Provider Notes (Signed)
[place] EMERGENCY DEPARTMENT AT [facility] Provider Note   CSN: [id number] Arrival date & time: 05/29/23  4098     History  Chief Complaint  Patient presents with   Denise Moses is a 42 y.o. female.   Fall     Pt presented to the ED for evaluation after a fall.  Pt fell down stairs on Friday.  She is able to walk but the pain increased this am so she came to the ED.  No head injury.  No loc.  Patient denies any other injuries other than her right lower leg  Home Medications Prior to Admission medications   Medication Sig Start Date End Date Taking? Authorizing Provider  DULoxetine (CYMBALTA) 60 MG capsule Take 1 capsule (60 mg total) by mouth daily. 11/04/22   Levert Feinstein, MD  FeFum-FePo-FA-B Cmp-C-Zn-Mn-Cu (TANDEM PLUS) 119-147.8-2 MG CAPS Take 1 tablet by mouth daily. 11/12/22   Levert Feinstein, MD  hydrochlorothiazide (MICROZIDE) 12.5 MG capsule Take 1 capsule (12.5 mg total) by mouth daily. 05/19/23   Ivonne Andrew, NP  naproxen (NAPROSYN) 500 MG tablet Take 500 mg by mouth 2 (two) times daily with a meal.    [provider]  NIFEdipine (PROCARDIA XL/NIFEDICAL-XL) 90 MG 24 hr tablet Take 1 tablet (90 mg total) by mouth daily. Patient not taking: Reported on 04/28/2021 05/08/20 05/08/21  Barbette Merino, NP  ondansetron (ZOFRAN) 4 MG tablet Take 2 tablets (8 mg total) by mouth every 8 (eight) hours as needed for nausea or vomiting. 12/10/22   Bing Neighbors, NP  oxybutynin (DITROPAN XL) 10 MG 24 hr tablet Take 1 tablet (10 mg total) by mouth at bedtime. 11/04/22   Levert Feinstein, MD  potassium chloride SA (KLOR-CON M) 20 MEQ tablet Take 1 tablet (20 mEq total) by mouth 2 (two) times daily for 3 days. 12/11/22 12/14/22  Bing Neighbors, NP  predniSONE (DELTASONE) 20 MG tablet Take 1 tablet (20 mg total) by mouth daily with breakfast. 05/19/23   Ivonne Andrew, NP  Vitamin D, Ergocalciferol, 50000 units CAPS Take 1 tablet by mouth once a week.  05/12/23   Lomax, Amy, NP      Allergies    Patient has no known allergies.    Review of Systems   Review of Systems  Physical Exam Updated Vital Signs BP 136/72 (BP Location: Right Arm)   Pulse 70   Temp 98.4 F (36.9 C)   Resp 15   SpO2 100%  Physical Exam Vitals and nursing note reviewed.  Constitutional:      General: She is not in acute distress.    Appearance: She is well-developed.  HENT:     Head: Normocephalic and atraumatic.     Right Ear: External ear normal.     Left Ear: External ear normal.  Eyes:     General: No scleral icterus.       Right eye: No discharge.        Left eye: No discharge.     Conjunctiva/sclera: Conjunctivae normal.  Neck:     Trachea: No tracheal deviation.  Cardiovascular:     Rate and Rhythm: Normal rate.  Pulmonary:     Effort: Pulmonary effort is normal. No respiratory distress.     Breath sounds: No stridor.  Abdominal:     General: There is no distension.  Musculoskeletal:        General: Tenderness present. No swelling or deformity.  Cervical back: Neck supple.     Comments: Tenderness to palpation right lateral malleolus, mild edema and tenderness in the foot, no proximal fibula tenderness  Skin:    General: Skin is warm and dry.     Findings: No rash.  Neurological:     Mental Status: She is alert. Mental status is at baseline.     Cranial Nerves: No dysarthria or facial asymmetry.     Motor: No seizure activity.     ED Results / Procedures / Treatments   Labs (all labs ordered are listed, but only abnormal results are displayed) Labs Reviewed - No data to display  EKG None  Radiology DG Ankle Complete Right  Result Date: 05/29/2023 CLINICAL DATA:  Right foot and ankle pain and swelling.  Fall. EXAM: RIGHT ANKLE - COMPLETE 3+ VIEW COMPARISON:  None Available. FINDINGS: Plafond and talar dome appear intact. No fracture or acute bony findings. Plantar calcaneal spur. IMPRESSION: 1. No acute bony findings. 2.  Plantar calcaneal spur. Electronically Signed   By: Gaylyn Rong M.D.   On: 05/29/2023 10:10   DG Foot Complete Right  Result Date: 05/29/2023 CLINICAL DATA:  Fall down stairs.  Foot/ankle pain and swelling. EXAM: RIGHT FOOT COMPLETE - 3+ VIEW COMPARISON:  None Available. FINDINGS: Small plantar calcaneal spur. No fracture or malalignment is identified. Mild dorsal soft tissue swelling along the forefoot. IMPRESSION: 1. Mild dorsal soft tissue swelling along the forefoot. No fracture or malalignment is identified. 2. Small plantar calcaneal spur. Electronically Signed   By: Gaylyn Rong M.D.   On: 05/29/2023 10:09    Procedures Procedures    Medications Ordered in ED Medications - No data to display  ED Course/ Medical Decision Making/ A&P                                 Medical Decision Making Problems Addressed: Sprain of right ankle, unspecified ligament, initial encounter: acute illness or injury that poses a threat to life or bodily functions  Amount and/or Complexity of Data Reviewed Radiology: ordered and independent interpretation performed.   Patient's x-rays are consistent with an ankle sprain.  No evidence of fracture or dislocation we will provide crutches and a brace.  Outpatient follow-up with orthopedics as needed.        Final Clinical Impression(s) / ED Diagnoses Final diagnoses:  Sprain of right ankle, unspecified ligament, initial encounter    Rx / DC Orders ED Discharge Orders     None         Linwood Dibbles, MD 05/29/23 1111

## 2023-05-29 NOTE — Discharge Instructions (Addendum)
Use the crutches and the splint for comfort.  Take over-the-counter medications as needed for pain.  Follow-up with the orthopedic doctor to be rechecked as we discussed

## 2023-05-31 ENCOUNTER — Encounter: Payer: Self-pay | Admitting: Physician Assistant

## 2023-05-31 ENCOUNTER — Ambulatory Visit (INDEPENDENT_AMBULATORY_CARE_PROVIDER_SITE_OTHER): Payer: Medicaid Other | Admitting: Physician Assistant

## 2023-05-31 DIAGNOSIS — G35 Multiple sclerosis: Secondary | ICD-10-CM

## 2023-05-31 DIAGNOSIS — M25561 Pain in right knee: Secondary | ICD-10-CM | POA: Diagnosis not present

## 2023-05-31 DIAGNOSIS — M25562 Pain in left knee: Secondary | ICD-10-CM | POA: Diagnosis not present

## 2023-05-31 DIAGNOSIS — G8929 Other chronic pain: Secondary | ICD-10-CM | POA: Diagnosis not present

## 2023-05-31 NOTE — Progress Notes (Signed)
Office Visit Note   Patient: Denise Moses           Date of Birth: 1980/11/25           MRN: [id number] Visit Date: 05/31/2023              Requested by: Denise Andrew, NP [phone] [address] [place],  [place] [address] PCP: Denise Andrew, NP   Assessment & Plan: Visit Diagnoses:  1. Multiple sclerosis (HCC)   2. Chronic pain of both knees     Plan: [name] is a pleasant 42 year old woman with a history of MS diagnosed about 5 to 6 years ago.[name]  She was referred by her primary care physician for bilateral knee pain and weakness.  Because of this she stumbled and in the meantime sprained her ankle and was seen in the emergency room where she was given a brace.  She does says she has had intermittent problems with her knees and weakness and did do therapy but had to discontinue it because she had a flare in her MS.  Most of her pain is anterior and patellofemoral.  Exam coordinates to this and overall she is just generally weak.  I would recommend good physical therapy for both strengthening and have given her also some exercises for quadricep strengthening she could start on her own.  Also gave her information about Voltaren gel they may also work on her ankle for rehab.  She may follow-up with me for a month  Follow-Up Instructions: Return in about 1 month (around 07/01/2023).   Orders:  Orders Placed This Encounter  Procedures   Ambulatory referral to Physical Therapy   No orders of the defined types were placed in this encounter.     Procedures: No procedures performed   Clinical Data: No additional findings.   Subjective: Chief Complaint  Patient presents with   Right Knee - Pain   Left Knee - Pain    HPI patient is a pleasant 42 year old woman with a chief complaint of bilateral knee pain.  She has a history of MS.  She has had a recent flare.  She was doing therapy for her knees but had to discontinue this.  Denies any fever or chills.  Describes  her knees pain as moderate especially with descending stairs no particular injury.  Review of Systems  All other systems reviewed and are negative.    Objective: Vital Signs: There were no vitals taken for this visit.  Physical Exam Constitutional:      Appearance: Normal appearance.  Pulmonary:     Effort: Pulmonary effort is normal.  Skin:    General: Skin is warm and dry.  Neurological:     General: No focal deficit present.     Mental Status: She is alert.     Ortho Exam Examination of the bilateral knees no erythema no effusion she is neurovascularly intact compartments are soft and nontender she has good range of motion no crepitus of the patellofemoral joint.  No real medial lateral joint line tenderness good varus valgus stability she does have some pain over the patellofemoral area of both of her knees.  Right greater than left.  She has some weakness in both of her lower legs. Specialty Comments:  No specialty comments available.  Imaging: No results found.   PMFS History: Patient Active Problem List   Diagnosis Date Noted   Urinary urgency 11/04/2022   Gait abnormality 11/04/2022   Other  fatigue 11/04/2022   Anemia 11/04/2022   Relapsing remitting multiple sclerosis (HCC) 08/26/2022   Neuropathic pain 09/11/2020   Cesarean delivery, delivered, current hospitalization 03/23/2020   Alpha thalassemia silent carrier 12/18/2019   AMA (advanced maternal age) multigravida 35+ 09/17/2019   Maternal obesity affecting pregnancy, antepartum 09/17/2019   Chronic hypertension affecting pregnancy 09/17/2019   Supervision of other normal pregnancy, antepartum 09/10/2019   GBS bacteriuria 08/19/2019   Multiple sclerosis (HCC) 04/30/2019   Cervical myelopathy (HCC) 04/12/2019   Paresthesia 04/12/2019   Vitamin D deficiency 07/05/2017   Iron deficiency anemia 07/05/2017   Herpes simplex antibody positive 04/15/2016   Recurrent boils 04/13/2016   Family history of  breast cancer in first degree relative 04/13/2016   Smoker 07/03/2014   Past Medical History:  Diagnosis Date   Abnormal MRI, cervical spine    Anemia    Elective abortion    Heart murmur    Herpes    Infection    UTI   Multiple sclerosis (HCC)    Pregnancy induced hypertension    2001   Vitamin D deficiency     Family History  Problem Relation Age of Onset   Breast cancer Mother 82       was a smoker, alcohol drinker in early life    Breast cancer Maternal Aunt    Cancer Maternal Grandfather        unsure of type   Other Father        unsure of history   Breast cancer Maternal Aunt     Past Surgical History:  Procedure Laterality Date   CESAREAN SECTION N/A 03/23/2020   Procedure: CESAREAN SECTION;  Surgeon: Tilda Burrow, MD;  Location: [facility] ORS;  Service: Obstetrics;  Laterality: N/A;  Arrest of Dilation   CHOLECYSTECTOMY     LAPAROSCOPY N/A 06/09/2016   Procedure: LAPAROSCOPY OPERATIVE;  Surgeon: Tereso Newcomer, MD;  Location: [facility] ORS;  Service: Gynecology;  Laterality: N/A;  ectopic    THERAPEUTIC ABORTION     x 4   TUBAL LIGATION     UNILATERAL SALPINGECTOMY Left 06/09/2016   Procedure: UNILATERAL SALPINGECTOMY, REMOVAL OF IUD;  Surgeon: Tereso Newcomer, MD;  Location: [facility] ORS;  Service: Gynecology;  Laterality: Left;   WISDOM TOOTH EXTRACTION     Social History   Occupational History   Occupation: Unemployed  Tobacco Use   Smoking status: Former    Current packs/day: 0.00    Average packs/day: 0.3 packs/day for 10.0 years (2.5 ttl pk-yrs)    Types: Cigarettes    Start date: 03/2009    Quit date: 03/2019    Years since quitting: 4.2   Smokeless tobacco: Never   Tobacco comments:    2-3 cigs/day  Vaping Use   Vaping status: Never Used  Substance and Sexual Activity   Alcohol use: Not Currently    Comment: social drinker   Drug use: Yes    Frequency: 5.0 times per week    Types: Marijuana    Comment: last used before delivery   Sexual activity:  Not Currently    Partners: Male    Birth control/protection: Surgical

## 2023-06-01 ENCOUNTER — Inpatient Hospital Stay: Payer: Medicaid Other

## 2023-06-01 ENCOUNTER — Telehealth: Payer: Self-pay | Admitting: Pharmacy Technician

## 2023-06-01 ENCOUNTER — Inpatient Hospital Stay: Payer: Medicaid Other | Attending: Hematology | Admitting: Hematology

## 2023-06-01 VITALS — BP 134/77 | HR 88 | Temp 97.5°F | Resp 20 | Wt 209.5 lb

## 2023-06-01 DIAGNOSIS — D509 Iron deficiency anemia, unspecified: Secondary | ICD-10-CM | POA: Diagnosis not present

## 2023-06-01 DIAGNOSIS — Z87891 Personal history of nicotine dependence: Secondary | ICD-10-CM | POA: Insufficient documentation

## 2023-06-01 DIAGNOSIS — E559 Vitamin D deficiency, unspecified: Secondary | ICD-10-CM | POA: Diagnosis not present

## 2023-06-01 DIAGNOSIS — D563 Thalassemia minor: Secondary | ICD-10-CM | POA: Insufficient documentation

## 2023-06-01 DIAGNOSIS — Z79899 Other long term (current) drug therapy: Secondary | ICD-10-CM | POA: Insufficient documentation

## 2023-06-01 LAB — VITAMIN D 25 HYDROXY (VIT D DEFICIENCY, FRACTURES): Vit D, 25-Hydroxy: 20.28 ng/mL — ABNORMAL LOW (ref 30–100)

## 2023-06-01 LAB — IRON AND IRON BINDING CAPACITY (CC-WL,HP ONLY)
Iron: 19 ug/dL — ABNORMAL LOW (ref 28–170)
Saturation Ratios: 4 % — ABNORMAL LOW (ref 10.4–31.8)
TIBC: 501 ug/dL — ABNORMAL HIGH (ref 250–450)
UIBC: 482 ug/dL — ABNORMAL HIGH (ref 148–442)

## 2023-06-01 LAB — HCG, SERUM, QUALITATIVE: Preg, Serum: NEGATIVE

## 2023-06-01 LAB — VITAMIN B12: Vitamin B-12: 211 pg/mL (ref 180–914)

## 2023-06-01 LAB — FERRITIN: Ferritin: 4 ng/mL — ABNORMAL LOW (ref 11–307)

## 2023-06-01 MED ORDER — VITAMIN D (ERGOCALCIFEROL) 50000 UNITS PO CAPS: 1.0000 | ORAL_CAPSULE | ORAL | 0 refills | Status: AC

## 2023-06-01 MED ORDER — B COMPLEX VITAMINS PO CAPS
1.0000 | ORAL_CAPSULE | Freq: Every day | ORAL | Status: AC
Start: 2023-06-01 — End: ?

## 2023-06-01 NOTE — Telephone Encounter (Signed)
Dr. Candise Che, [facility] note:  Patient will be scheduled as soon as possible.  Auth Submission: NO AUTH NEEDED Site of care: Site of care: [facility] Payer: [employer] Medication & CPT/J Code(s) submitted: Monoferric (Ferrci derisomaltose) [phone] Route of submission (phone, fax, portal):  Phone # Fax # Auth type: Buy/Bill PB Units/visits requested: 1 Reference number:  Approval from: 06/01/23 to 08/01/23

## 2023-06-01 NOTE — Progress Notes (Signed)
HEMATOLOGY/ONCOLOGY CONSULTATION NOTE  Date of Service: 06/01/2023  Patient Care Team: Ivonne Andrew, NP as PCP - General (Pulmonary Disease) Sheral Apley, MD as Attending Physician (Orthopedic Surgery)  CHIEF COMPLAINTS/PURPOSE OF CONSULTATION:  evaluation and management of iron deficiency anemia.   HISTORY OF PRESENTING ILLNESS:  Denise Moses is a wonderful 42 y.o. female who has been referred to [place] by Shawnie Dapper, NP for evaluation and management of iron deficiency anemia.   Patient's labs show that she has had a history of anemia since at least 2021. She reports that she has been anemic since adulthood.   Patient reports a history of heavy menstrual cycles. She reports that she was previously on birth control when she was younger, which did improve her menstrual flow. However, her periods have become heavier as she got older. Patient has 6 children and has a history of 4 abortions. Her youngest child is 12 years old and her oldest is 22 years old. Patient was last pregnant 4 years ago. She has not had any abortions or pregnancies since 3 years ago.   Her periods have been intermittent since her last pregnancy. She reports fluctuating heaviness in her menstrual cycles. Her periods are usually heavy with blood clotting and she reports needing to change pads frequently. Patient does endorse cramping with her heavier periods causing her to stay in bed for some time. She notes that if her periods are heavy, they would typically be heavier during the first couple of days and periods last 5-6 days. However, if her menstrual cycles are not as heavy, her periods may be shorter.   Patient was last seen by an OB/GYN when she was last pregnant. There was no suggestion of any particular reason for her heavy periods at that time. Patient denies any history of fibroids.   She is not on any medications to control her periods at this time such as IUD or contraceptives. Patient was  previously taking oral iron 3 years ago, but has not been on iron replacement since. She has not required IV iron or blood transfusions previously.   Besides her menstrual losses, patient has no other issues of blood loss. She denies any black stools, blood in the stools, blood in the urine, or recent surgeries.   Patient has a history of MS and is followed by a neurologist. She does have MS relapses from time to time. Her last MS relapse was in March 2024, which presented with immobility on both sides of her body. She was treated with steroids and worked towards learning to walk again. She was recently switched to [facility] after her relapse in March after previously being on Tecfidera. Patient reports that her pain is controlled to some extent with medication. Patient was most recently seen by neurology yesterday for management of knee issues and weakness in her legs. Patient does use a cane.   She repeorts plenty of fatigue and does eat ice frequently. Her diet is fairly balanced and she denies any major restrictions such as vegetarian or gluten intolerance.   She was found to be vitamin D deficient recently and takes one capsule of vitamin D replacement once a week. Patient is not on any other multivitamins.   Patient denies any abdominal pain or change in bowel habits. She has not had a colonoscopy previously. She denies any fhx of blood disorders such as thalassemia or sickle cell disease. She denies any fhx of any other medical issues. She denies any medication  allergies or other allergic tendencies such as asthma or skin rashes.   Patient denies any bowel issues, stomach issues, or any reason to be on acid suppressants for a long period of time.   Patient reports having a recent fall down the stairs on Friday, 05/27/2023 and subsequently twisted her ankle and endorsed swelling. She used an ankle brace to manage symptoms.   She has no other significant medical issues that are bothersome at this  time.   MEDICAL HISTORY:  Past Medical History:  Diagnosis Date   Abnormal MRI, cervical spine    Anemia    Elective abortion    Heart murmur    Herpes    Infection    UTI   Multiple sclerosis (HCC)    Pregnancy induced hypertension    2001   Vitamin D deficiency     SURGICAL HISTORY: Past Surgical History:  Procedure Laterality Date   CESAREAN SECTION N/A 03/23/2020   Procedure: CESAREAN SECTION;  Surgeon: Tilda Burrow, MD;  Location: [facility];  Service: Obstetrics;  Laterality: N/A;  Arrest of Dilation   CHOLECYSTECTOMY     LAPAROSCOPY N/A 06/09/2016   Procedure: LAPAROSCOPY OPERATIVE;  Surgeon: Tereso Newcomer, MD;  Location: [facility] ORS;  Service: Gynecology;  Laterality: N/A;  ectopic    THERAPEUTIC ABORTION     x 4   TUBAL LIGATION     UNILATERAL SALPINGECTOMY Left 06/09/2016   Procedure: UNILATERAL SALPINGECTOMY, REMOVAL OF IUD;  Surgeon: Tereso Newcomer, MD;  Location: [facility] ORS;  Service: Gynecology;  Laterality: Left;   WISDOM TOOTH EXTRACTION      SOCIAL HISTORY: Social History   Socioeconomic History   Marital status: Single    Spouse name: Not on file   Number of children: 6   Years of education: some college   Highest education level: Not on file  Occupational History   Occupation: Unemployed  Tobacco Use   Smoking status: Former    Current packs/day: 0.00    Average packs/day: 0.3 packs/day for 10.0 years (2.5 ttl pk-yrs)    Types: Cigarettes    Start date: 03/2009    Quit date: 03/2019    Years since quitting: 4.2   Smokeless tobacco: Never   Tobacco comments:    2-3 cigs/day  Vaping Use   Vaping status: Never Used  Substance and Sexual Activity   Alcohol use: Not Currently    Comment: social drinker   Drug use: Yes    Frequency: 5.0 times per week    Types: Marijuana    Comment: last used before delivery   Sexual activity: Not Currently    Partners: Male    Birth control/protection: Surgical  Other Topics Concern   Not on file  Social  History Narrative   Lives at home with children.   Right-handed.   No daily caffeine use.   Social Determinants of Health   Financial Resource Strain: Low Risk  (04/08/2020)   Overall Financial Resource Strain (CARDIA)    Difficulty of Paying Living Expenses: Not hard at all  Food Insecurity: No Food Insecurity (11/11/2022)   Hunger Vital Sign    Worried About Running Out of Food in the Last Year: Never true    Ran Out of Food in the Last Year: Never true  Transportation Needs: No Transportation Needs (11/11/2022)   PRAPARE - Administrator, Civil Service (Medical): No    Lack of Transportation (Non-Medical): No  Physical Activity: Insufficiently Active (05/20/2020)  Exercise Vital Sign    Days of Exercise per Week: 2 days    Minutes of Exercise per Session: 30 min  Stress: Stress Concern Present (04/08/2020)   [employer] of Occupational Health - Occupational Stress Questionnaire    Feeling of Stress : To some extent  Social Connections: Moderately Isolated (04/08/2020)   Social Connection and Isolation Panel [NHANES]    Frequency of Communication with Friends and Family: More than three times a week    Frequency of Social Gatherings with Friends and Family: More than three times a week    Attends Religious Services: More than 4 times per year    Active Member of [employer] or Organizations: No    Attends Banker Meetings: Never    Marital Status: Never married  Intimate Partner Violence: Not At Risk (11/11/2022)   Humiliation, Afraid, Rape, and Kick questionnaire    Fear of Current or Ex-Partner: No    Emotionally Abused: No    Physically Abused: No    Sexually Abused: No    FAMILY HISTORY: Family History  Problem Relation Age of Onset   Breast cancer Mother 52       was a smoker, alcohol drinker in early life    Breast cancer Maternal Aunt    Cancer Maternal Grandfather        unsure of type   Other Father        unsure of history   Breast cancer  Maternal Aunt     ALLERGIES:  has No Known Allergies.  MEDICATIONS:  Current Outpatient Medications  Medication Sig Dispense Refill   DULoxetine (CYMBALTA) 60 MG capsule Take 1 capsule (60 mg total) by mouth daily. 90 capsule 3   FeFum-FePo-FA-B Cmp-C-Zn-Mn-Cu (TANDEM PLUS) 162-115.2-1 MG CAPS Take 1 tablet by mouth daily. 90 capsule 3   hydrochlorothiazide (MICROZIDE) 12.5 MG capsule Take 1 capsule (12.5 mg total) by mouth daily. 90 capsule 2   naproxen (NAPROSYN) 500 MG tablet Take 500 mg by mouth 2 (two) times daily with a meal.     NIFEdipine (PROCARDIA XL/NIFEDICAL-XL) 90 MG 24 hr tablet Take 1 tablet (90 mg total) by mouth daily. (Patient not taking: Reported on 04/28/2021) 90 tablet 3   ondansetron (ZOFRAN) 4 MG tablet Take 2 tablets (8 mg total) by mouth every 8 (eight) hours as needed for nausea or vomiting. 12 tablet 0   oxybutynin (DITROPAN XL) 10 MG 24 hr tablet Take 1 tablet (10 mg total) by mouth at bedtime. 30 tablet 11   potassium chloride SA (KLOR-CON M) 20 MEQ tablet Take 1 tablet (20 mEq total) by mouth 2 (two) times daily for 3 days. 6 tablet 0   predniSONE (DELTASONE) 20 MG tablet Take 1 tablet (20 mg total) by mouth daily with breakfast. 5 tablet 0   Vitamin D, Ergocalciferol, 50000 units CAPS Take 1 tablet by mouth once a week. 12 capsule 0   No current facility-administered medications for this visit.    REVIEW OF SYSTEMS:    10 Point review of Systems was done is negative except as noted above.  PHYSICAL EXAMINATION: ECOG PERFORMANCE STATUS: 1 - Symptomatic but completely ambulatory  . Vitals:   06/01/23 1105  BP: 134/77  Pulse: 88  Resp: 20  Temp: (!) 97.5 F (36.4 C)  SpO2: 100%   Filed Weights   06/01/23 1105  Weight: 209 lb 8 oz (95 kg)   .Body mass index is 33.81 kg/m.  GENERAL:alert, in no acute distress and  comfortable SKIN: no acute rashes, no significant lesions EYES: conjunctiva are pink and non-injected, sclera anicteric OROPHARYNX:  MMM, no exudates, no oropharyngeal erythema or ulceration NECK: supple, no JVD LYMPH:  no palpable lymphadenopathy in the cervical, axillary or inguinal regions LUNGS: clear to auscultation b/l with normal respiratory effort HEART: regular rate & rhythm ABDOMEN:  normoactive bowel sounds , non tender, not distended. Extremity: no pedal edema PSYCH: alert & oriented x 3 with fluent speech NEURO: no focal motor/sensory deficits  LABORATORY DATA:  I have reviewed the data as listed  .    Latest Ref Rng & Units 05/11/2023    2:10 PM 12/10/2022    1:27 PM 11/04/2022   11:57 AM  CBC  WBC 3.4 - 10.8 x10E3/uL 6.6  18.2  6.2   Hemoglobin 11.1 - 15.9 g/dL 8.8  8.3  9.1   Hematocrit 34.0 - 46.6 % 29.9  27.4  31.0   Platelets 150 - 450 x10E3/uL 359  296          Latest Ref Rng & Units 05/11/2023    2:10 PM 12/10/2022    1:27 PM 11/04/2022   11:57 AM  CMP  Glucose 70 - 99 mg/dL 83  88  75   BUN 6 - 24 mg/dL 8  5  7    Creatinine 0.57 - 1.00 mg/dL 4.09  8.11  9.14   Sodium 134 - 144 mmol/L 138  135  137   Potassium 3.5 - 5.2 mmol/L 4.1  2.9  4.2   Chloride 96 - 106 mmol/L 104  105  103   CO2 20 - 29 mmol/L 18  19  23    Calcium 8.7 - 10.2 mg/dL 9.3  8.9  9.5   Total Protein 6.0 - 8.5 g/dL 7.4  7.3  7.8   Total Bilirubin 0.0 - 1.2 mg/dL 0.4  1.2  0.3   Alkaline Phos 44 - 121 IU/L 70  73  75   AST 0 - 40 IU/L 11  19  11    ALT 0 - 32 IU/L 6  32  13        RADIOGRAPHIC STUDIES: I have personally reviewed the radiological images as listed and agreed with the findings in the report. DG Ankle Complete Right  Result Date: 05/29/2023 CLINICAL DATA:  Right foot and ankle pain and swelling.  Fall. EXAM: RIGHT ANKLE - COMPLETE 3+ VIEW COMPARISON:  None Available. FINDINGS: Plafond and talar dome appear intact. No fracture or acute bony findings. Plantar calcaneal spur. IMPRESSION: 1. No acute bony findings. 2. Plantar calcaneal spur. Electronically Signed   By: Gaylyn Rong M.D.   On:  05/29/2023 10:10   DG Foot Complete Right  Result Date: 05/29/2023 CLINICAL DATA:  Fall down stairs.  Foot/ankle pain and swelling. EXAM: RIGHT FOOT COMPLETE - 3+ VIEW COMPARISON:  None Available. FINDINGS: Small plantar calcaneal spur. No fracture or malalignment is identified. Mild dorsal soft tissue swelling along the forefoot. IMPRESSION: 1. Mild dorsal soft tissue swelling along the forefoot. No fracture or malalignment is identified. 2. Small plantar calcaneal spur. Electronically Signed   By: Gaylyn Rong M.D.   On: 05/29/2023 10:09   DG Knee Complete 4 Views Right  Result Date: 05/19/2023 CLINICAL DATA:  Bilateral knee pain for 3 months. Chronic bilateral knee pain. EXAM: RIGHT KNEE - COMPLETE 4+ VIEW COMPARISON:  None Available. FINDINGS: No evidence of fracture, dislocation, or joint effusion. The alignment and joint spaces are normal. No evidence of arthropathy  or other focal bone abnormality. Soft tissues are unremarkable. IMPRESSION: Negative radiographs of the right knee. Electronically Signed   By: Narda Rutherford M.D.   On: 05/19/2023 17:09   DG Knee Complete 4 Views Left  Result Date: 05/19/2023 CLINICAL DATA:  Bilateral knee pain for 3 months. Chronic pain of both knees. EXAM: LEFT KNEE - COMPLETE 4+ VIEW COMPARISON:  None Available. FINDINGS: No evidence of fracture, dislocation, or joint effusion. The alignment and joint spaces are preserved. No evidence of arthropathy or other focal bone abnormality. Soft tissues are unremarkable. IMPRESSION: Negative radiographs of the left knee. Electronically Signed   By: Narda Rutherford M.D.   On: 05/19/2023 17:08    ASSESSMENT & PLAN:  42 y.o. female with:  Iron deficiency anemia  PLAN:  -CBC from 05/11/2023 showed WBC of 6.6K, hemoglobin of 8.8, and platelets of 359K. Iron panel showed iron saturation of 5% and ferritin level of 6.  -Her RBCS are slightly higher than expected with her hgb levels  -She is fairly anemic at  this time with hgb 8.8. Educated patient that if her hgb is less than 7, there would be considerations of blood transfusions  -no baseline microcytosis -majority of her anemia is from iron deficiency -vitamin D deficient at 18.3 ng/mL -recommend patient to take OTC Vitamin B complex for at least once a day for 3 months -will evaluate to rule out thalassemia trait Patient has no overt disease of thalassemia from hgb.  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  -will order blood tests for further analysis  FOLLOW-UP: Labs today IV Monoferric [address] infusion as soon as possible Return to clinic with Dr. Candise Che with labs in 3 months  The total time spent in the appointment was 50 minutes* .  All of the patient's questions were answered with apparent satisfaction. The patient knows to call the clinic with any problems, questions or concerns.   Wyvonnia Lora MD MS AAHIVMS [facility] Hematology/Oncology Physician [facility]  .*Total Encounter Time as defined by the Centers for Medicare and Medicaid Services includes, in addition to the face-to-face time of a patient visit (documented in the note above) non-face-to-face time: obtaining and reviewing outside history, ordering and reviewing medications, tests or procedures, care coordination (communications with other health care  professionals or caregivers) and documentation in the medical record.    I,Mitra Faeizi,acting as a Neurosurgeon for Wyvonnia Lora, MD.,have documented all relevant documentation on the behalf of Wyvonnia Lora, MD,as directed by  Wyvonnia Lora, MD while in the presence of Wyvonnia Lora, MD.  .I have reviewed the above documentation for accuracy and completeness, and I agree with the above. Johney Maine MD

## 2023-06-02 MED ORDER — ACETAMINOPHEN 325 MG PO TABS: 650.0000 mg | ORAL_TABLET | Freq: Once | ORAL | Status: DC

## 2023-06-02 MED ORDER — LORATADINE 10 MG PO TABS: 10.0000 mg | ORAL_TABLET | Freq: Once | ORAL | Status: DC

## 2023-06-02 MED ORDER — FERRIC DERISOMALTOSE(ONE DOSE) 1000 MG/10ML IV SOLN
1000.0000 mg | Freq: Once | INTRAVENOUS | Status: DC
  Filled 2023-06-02: qty 10

## 2023-06-08 ENCOUNTER — Encounter: Payer: Self-pay | Admitting: Hematology

## 2023-06-13 ENCOUNTER — Ambulatory Visit (INDEPENDENT_AMBULATORY_CARE_PROVIDER_SITE_OTHER): Payer: Medicaid Other

## 2023-06-13 VITALS — BP 121/76 | HR 73 | Temp 98.1°F | Resp 16 | Ht 66.0 in | Wt 213.2 lb

## 2023-06-13 DIAGNOSIS — D509 Iron deficiency anemia, unspecified: Secondary | ICD-10-CM | POA: Diagnosis not present

## 2023-06-13 MED ORDER — SODIUM CHLORIDE 0.9 % IV SOLN
1000.0000 mg | Freq: Once | INTRAVENOUS | Status: AC
  Administered 2023-06-13: 1000 mg via INTRAVENOUS
  Filled 2023-06-13: qty 10

## 2023-06-13 MED ORDER — ACETAMINOPHEN 325 MG PO TABS
650.0000 mg | ORAL_TABLET | Freq: Once | ORAL | Status: AC
  Administered 2023-06-13: 650 mg via ORAL
  Filled 2023-06-13: qty 2

## 2023-06-13 MED ORDER — LORATADINE 10 MG PO TABS
10.0000 mg | ORAL_TABLET | Freq: Once | ORAL | Status: AC
  Administered 2023-06-13: 10 mg via ORAL
  Filled 2023-06-13: qty 1

## 2023-06-13 NOTE — Progress Notes (Signed)
Diagnosis: Iron Deficiency Anemia  Provider:  Chilton Greathouse MD  Procedure: IV Infusion  IV Type: Peripheral, IV Location: L Hand  Monoferric (Ferric Derisomaltose), Dose: 1000 mg  Infusion Start Time: 0908  Infusion Stop Time: 0931  Post Infusion IV Care: Observation period completed and Peripheral IV Discontinued  Discharge: Condition: Good, Destination: Home . AVS Declined  Performed by:  Rico Ala, LPN

## 2023-06-22 ENCOUNTER — Ambulatory Visit: Payer: Medicaid Other | Attending: Nurse Practitioner | Admitting: Physical Therapy

## 2023-06-22 DIAGNOSIS — M25562 Pain in left knee: Secondary | ICD-10-CM | POA: Diagnosis present

## 2023-06-22 DIAGNOSIS — G35 Multiple sclerosis: Secondary | ICD-10-CM | POA: Insufficient documentation

## 2023-06-22 DIAGNOSIS — M6281 Muscle weakness (generalized): Secondary | ICD-10-CM | POA: Insufficient documentation

## 2023-06-22 DIAGNOSIS — R2681 Unsteadiness on feet: Secondary | ICD-10-CM | POA: Insufficient documentation

## 2023-06-22 DIAGNOSIS — M25561 Pain in right knee: Secondary | ICD-10-CM | POA: Insufficient documentation

## 2023-06-22 DIAGNOSIS — R2689 Other abnormalities of gait and mobility: Secondary | ICD-10-CM | POA: Insufficient documentation

## 2023-06-22 DIAGNOSIS — G8929 Other chronic pain: Secondary | ICD-10-CM | POA: Insufficient documentation

## 2023-06-22 NOTE — Addendum Note (Signed)
Addended by: Peter Congo on: 06/22/2023 04:06 PM   Modules accepted: Orders

## 2023-06-22 NOTE — Therapy (Addendum)
OUTPATIENT PHYSICAL THERAPY NEURO EVALUATION   Patient Name: Denise Moses MRN: [id number] DOB:1981/06/02, 42 y.o., female Today's Date: 06/22/2023   PCP: Ivonne Andrew, NP REFERRING PROVIDER: Persons, [place], [place]  END OF SESSION:  PT End of Session - 06/22/23 1146     Visit Number 1    Number of Visits 13   including eval   Date for PT Re-Evaluation 08/17/23   in case of scheduling delays   Authorization Type [place] MEDICA[employer]    Progress Note Due on Visit 10    PT Start Time 1148    PT Stop Time 1232    PT Time Calculation (min) 44 min    Activity Tolerance Patient tolerated treatment well;Patient limited by pain    Behavior During Therapy [facility] for tasks assessed/performed             Past Medical History:  Diagnosis Date   Abnormal MRI, cervical spine    Anemia    Elective abortion    Heart murmur    Herpes    Infection    UTI   Multiple sclerosis (HCC)    Pregnancy induced hypertension    2001   Vitamin D deficiency    Past Surgical History:  Procedure Laterality Date   CESAREAN SECTION N/A 03/23/2020   Procedure: CESAREAN SECTION;  Surgeon: Tilda Burrow, MD;  Location: [facility] LD ORS;  Service: Obstetrics;  Laterality: N/A;  Arrest of Dilation   CHOLECYSTECTOMY     LAPAROSCOPY N/A 06/09/2016   Procedure: LAPAROSCOPY OPERATIVE;  Surgeon: Tereso Newcomer, MD;  Location: [facility] ORS;  Service: Gynecology;  Laterality: N/A;  ectopic    THERAPEUTIC ABORTION     x 4   TUBAL LIGATION     UNILATERAL SALPINGECTOMY Left 06/09/2016   Procedure: UNILATERAL SALPINGECTOMY, REMOVAL OF IUD;  Surgeon: Tereso Newcomer, MD;  Location: [facility] ORS;  Service: Gynecology;  Laterality: Left;   WISDOM TOOTH EXTRACTION     Patient Active Problem List   Diagnosis Date Noted   Urinary urgency 11/04/2022   Gait abnormality 11/04/2022   Other fatigue 11/04/2022   Anemia 11/04/2022   Relapsing remitting multiple sclerosis (HCC) 08/26/2022   Neuropathic  pain 09/11/2020   Cesarean delivery, delivered, current hospitalization 03/23/2020   Alpha thalassemia silent carrier 12/18/2019   AMA (advanced maternal age) multigravida 35+ 09/17/2019   Maternal obesity affecting pregnancy, antepartum 09/17/2019   Chronic hypertension affecting pregnancy 09/17/2019   Supervision of other normal pregnancy, antepartum 09/10/2019   GBS bacteriuria 08/19/2019   Multiple sclerosis (HCC) 04/30/2019   Cervical myelopathy (HCC) 04/12/2019   Paresthesia 04/12/2019   Vitamin D deficiency 07/05/2017   Iron deficiency anemia 07/05/2017   Herpes simplex antibody positive 04/15/2016   Recurrent boils 04/13/2016   Family history of breast cancer in first degree relative 04/13/2016   Smoker 07/03/2014    ONSET DATE: 05/31/2023 (referral date)  REFERRING DIAG: G35 (ICD-10-CM) - Multiple sclerosis (HCC) M25.561,M25.562,G89.29 (ICD-10-CM) - Chronic pain of both knees  THERAPY DIAG:  Unsteadiness on feet  Muscle weakness (generalized)  Other abnormalities of gait and mobility  Chronic pain of left knee  Chronic pain of right knee  Rationale for Evaluation and Treatment: Rehabilitation  SUBJECTIVE:  SUBJECTIVE STATEMENT: Pt reports popping and pain in both knees. Pain has been going on "for a while". Pain is not present when sitting, only when active. Pt has been putting on a topical arthritis cream which is somewhat helpful. "My right side is the worst side", when BLEs are aggravated they can take ~30 minutes to recover.   Pt accompanied by: self  PERTINENT HISTORY: Relapsing-remitting MS diagnosed 5-6 years ago, fatigue, anemia, urinary urgency, cervical myelopathy, cholecystectomy, hx of cesarean section, tubal ligation  PAIN:  Are you having pain? No; not when  sitting  PRECAUTIONS: Fall  RED FLAGS: Bowel or bladder incontinence: No   WEIGHT BEARING RESTRICTIONS: No  FALLS: Has patient fallen in last 6 months? Yes. Number of falls 1; October 19th or 20th, coming down steps at home, was moving too fast, had never fallen down the steps before, R foot may have fallen asleep, did not hit head, twisted ankle, went to ED, received a boot and crutches  LIVING ENVIRONMENT: Lives with: lives with their family; 6 children, 2 adults 13 and 6 Lives in: House/apartment Stairs: Yes: Internal: 1 flight steps; on left going up and External: 1 steps; none Has following equipment at home: Single point cane, Walker - 2 wheeled, Crutches, and shower chair  PLOF: Requires assistive device for independence and Needs assistance with ADLs "it depends on the day", has not worked since MS relapse in January  PATIENT GOALS: "get stronger", "get off this cane"  OBJECTIVE:  Note: Objective measures were completed at Evaluation unless otherwise noted.  DIAGNOSTIC FINDINGS:  DG Knee Complete 4 Views Right 05/19/23 FINDINGS: No evidence of fracture, dislocation, or joint effusion. The alignment and joint spaces are normal. No evidence of arthropathy or other focal bone abnormality. Soft tissues are unremarkable.  DG Knee Complete 4 Views Left 05/19/23 FINDINGS: No evidence of fracture, dislocation, or joint effusion. The alignment and joint spaces are preserved. No evidence of arthropathy or other focal bone abnormality. Soft tissues are unremarkable.  DG Foot Complete Right 05/29/23 IMPRESSION: 1. Mild dorsal soft tissue swelling along the forefoot. No fracture or malalignment is identified. 2. Small plantar calcaneal spur.  DG Ankle Complete Right 05/29/23 IMPRESSION: 1. No acute bony findings. 2. Plantar calcaneal spur.   COGNITION: Overall cognitive status: Within functional limits for tasks assessed   SENSATION: Light touch: Impaired "I can  barely feel the touch on the R side"  LOWER EXTREMITY ROM:     Passive : measured in supine, hip flexed to 90* Right Eval Left Eval  Hip flexion    Hip extension    Hip abduction    Hip adduction    Hip internal rotation Very limited, ~5* Limited, ~10*  Hip external rotation Very limited, ~10* Limited, ~20*  Knee flexion    Knee extension    Ankle dorsiflexion    Ankle plantarflexion    Ankle inversion    Ankle eversion     (Blank rows = not tested)  LOWER EXTREMITY MMT:    MMT Right Eval Left Eval  Hip flexion 3+, painful 3  Hip extension    Hip abduction 2 2  Hip adduction    Hip internal rotation    Hip external rotation    Knee flexion 3, painful 3  Knee extension 3, painful 3  Ankle dorsiflexion 3 4  Ankle plantarflexion    Ankle inversion    Ankle eversion    (Blank rows = not tested)  BED MOBILITY:  Sit to  supine Complete Independence Supine to sit Min A Rolling to Right Modified independence Rolling to Left Modified independence  TRANSFERS: Assistive device utilized: Single point cane  Sit to stand: Modified independence Stand to sit: Modified independence Floor:  TBA  GAIT: Gait pattern: step through pattern, antalgic, and wide BOS Distance walked: various clinical distances Assistive device utilized: Single point cane Level of assistance: Modified independence Comments: slowly  FUNCTIONAL TESTS:  5 times sit to stand: 72.31 seconds, BUE support, CGA, pt's pain 8/10 Timed up and go (TUG): TBA 10 meter walk test: 32.8 ft over 35.84 seconds= 0.92 ft/sec, SPC, close SBA  PATIENT SURVEYS:  WOMAC TBA  TODAY'S TREATMENT:                                                                                                                              N/a, eval only   PATIENT EDUCATION: Education details: POC, exam findings, xray findings Person educated: Patient Education method: Medical illustrator Education comprehension: verbalized  understanding and needs further education  HOME EXERCISE PROGRAM: To be initiated  GOALS: Goals reviewed with patient? No  SHORT TERM GOALS: Target date: 07/13/2023   Pt will be independent with initial HEP for improved strength, balance, transfers and gait.  Baseline: Goal status: INITIAL  2.  Pt will improve 5 x STS to less than or equal to 60 seconds to demonstrate improved functional strength and transfer efficiency.   Baseline: 72.31 seconds, BUE support, CGA, pt's pain 8/10 (06/22/23) Goal status: INITIAL  3.  Pt will improve gait velocity to at least 1.24 ft/sec w/ LRAD for improved gait efficiency and performance at Mod-I level   Baseline:  0.92 ft/sec, SPC, close SBA (06/22/23) Goal status: INITIAL  4.  TUG to be assessed and STG set as appropriate Baseline:  Goal status: INITIAL  LONG TERM GOALS: Target date: 08/03/2023   Pt will be independent with final HEP for improved strength, balance, transfers and gait.  Baseline:  Goal status: INITIAL  2.  Pt will improve 5 x STS to less than or equal to 45 seconds to demonstrate improved functional strength and transfer efficiency.   Baseline: 72.31 seconds, BUE support, CGA, pt's pain 8/10 (06/22/23) Goal status: INITIAL  3.  Pt will improve gait velocity to at least 1.57 ft/sec w/ LRAD for improved gait efficiency and performance at Mod-I level   Baseline: 0.92 ft/sec, SPC, close SBA (06/22/23) Goal status: INITIAL  4.  TUG to be assessed and LTG set as appropriate Baseline:  Goal status: INITIAL  5.  WOMAC to be assessed and LTG set as appropriate Baseline:  Goal status: INITIAL  6.  Bilateral hip abduction MMT to improve to at least 3/5 to demonstrate improved functional strength and decreased torque to bilateral knees. Baseline: 2/5 bilaterally (06/22/23) Goal status: INITIAL  7.  Bilateral hip IR and ER ROM to improve by at least 10* to demonstrate improved functional ROM and muscle lengths.  Baseline: R IR  ~5*, R ER ~10*, L IR ~10*, L ER ~20* (06/22/23) Goal status: INITIAL  ASSESSMENT:  CLINICAL IMPRESSION: Patient is a 42 year old female referred to Neuro OPPT for Relapsing-remitting Multiple sclerosis and Chronic pain of both knees.   Pt's PMH is significant for: Relapsing-remitting MS diagnosed 5-6 years ago, fatigue, anemia, urinary urgency, cervical myelopathy, cholecystectomy, hx of cesarean section, tubal ligation. The following deficits were present during the exam: weakness of BLEs, limited ROM of BLEs, abnormal gait, impaired balance, impaired sensation, and pain. Based on gait speed of 0.92 ft/sec and 5x STS time of 72.31 seconds, pt is an incr risk for falls. Pt would benefit from skilled PT to address these impairments and functional limitations to maximize functional mobility independence   OBJECTIVE IMPAIRMENTS: Abnormal gait, decreased activity tolerance, decreased balance, decreased endurance, decreased knowledge of condition, decreased knowledge of use of DME, decreased mobility, difficulty walking, decreased ROM, decreased strength, hypomobility, impaired perceived functional ability, increased muscle spasms, impaired flexibility, impaired sensation, improper body mechanics, postural dysfunction, obesity, and pain.   ACTIVITY LIMITATIONS: carrying, lifting, bending, sitting, standing, squatting, sleeping, stairs, transfers, bathing, dressing, hygiene/grooming, locomotion level, and caring for others  PARTICIPATION LIMITATIONS: meal prep, cleaning, laundry, medication management, driving, shopping, community activity, and occupation  PERSONAL FACTORS: Age, Fitness, Sex, Time since onset of injury/illness/exacerbation, and 3+ comorbidities:   Relapsing-remitting MS diagnosed 5-6 years ago, fatigue, anemia, urinary urgency, cervical myelopathy, cholecystectomy, hx of cesarean section, tubal ligation are also affecting patient's functional outcome.   REHAB POTENTIAL: Fair time since  onset of pain, MS relapsing-remiting  CLINICAL DECISION MAKING: Stable/uncomplicated  EVALUATION COMPLEXITY: Low  PLAN:  PT FREQUENCY: 2x/week  PT DURATION: 6 weeks  PLANNED INTERVENTIONS: 97164- PT Re-evaluation, 97110-Therapeutic exercises, 97530- Therapeutic activity, 97112- Neuromuscular re-education, 97535- Self Care, 91478- Manual therapy, [id number]- Gait training, [phone]- Orthotic Fit/training, [phone]- Canalith repositioning, 97014- Electrical stimulation (unattended), [id number]- Electrical stimulation (manual), [id number]- Traction (mechanical), [id number]- Wound care (first 20 sq cm), 97598- Wound care (each additional 20 sq cm), Patient/Family education, Balance training, Stair training, Taping, Dry Needling, Joint mobilization, Joint manipulation, Spinal manipulation, Spinal mobilization, Scar mobilization, Vestibular training, Visual/preceptual remediation/compensation, DME instructions, Cryotherapy, and Moist heat  PLAN FOR NEXT SESSION: Initiate HEP for BLE and core strengthening, balance, NMR, emphasis on hip abduction strengthening (2/5 MMT) as well as mobility for hip IR and ER; assess WOMAC and write LTG, TUG and write STG+LTG; STS w/ anterior weight shift from elevated mat table, quad sets, SLR, prone hamstring curls, calf stretches, quad stretches, piriformis stretch, lumbar spine rotation, cycling    For all possible CPT codes, reference the Planned Interventions line above.     Check all conditions that are expected to impact treatment: {Conditions expected to impact treatment:Neurological condition and/or seizures and Presence of Medical Equipment   If treatment provided at initial evaluation, no treatment charged due to lack of authorization.        Peter Congo, PT 06/22/2023, 4:05 PM

## 2023-07-04 ENCOUNTER — Ambulatory Visit: Payer: Medicaid Other | Admitting: Physician Assistant

## 2023-07-06 ENCOUNTER — Ambulatory Visit: Payer: Medicaid Other | Attending: Nurse Practitioner | Admitting: Physical Therapy

## 2023-07-06 DIAGNOSIS — M25561 Pain in right knee: Secondary | ICD-10-CM | POA: Insufficient documentation

## 2023-07-06 DIAGNOSIS — G8929 Other chronic pain: Secondary | ICD-10-CM | POA: Insufficient documentation

## 2023-07-06 DIAGNOSIS — M6281 Muscle weakness (generalized): Secondary | ICD-10-CM | POA: Insufficient documentation

## 2023-07-06 DIAGNOSIS — M25562 Pain in left knee: Secondary | ICD-10-CM | POA: Insufficient documentation

## 2023-07-06 DIAGNOSIS — R2689 Other abnormalities of gait and mobility: Secondary | ICD-10-CM | POA: Insufficient documentation

## 2023-07-06 DIAGNOSIS — R2681 Unsteadiness on feet: Secondary | ICD-10-CM | POA: Insufficient documentation

## 2023-07-06 NOTE — Therapy (Incomplete)
OUTPATIENT PHYSICAL THERAPY NEURO TREATMENT   Patient Name: Denise Moses MRN: [id number] DOB:06/10/1981, 42 y.o., female Today's Date: 07/06/2023   PCP: Ivonne Andrew, NP REFERRING PROVIDER: Persons, [place], [place]  END OF SESSION:    Past Medical History:  Diagnosis Date   Abnormal MRI, cervical spine    Anemia    Elective abortion    Heart murmur    Herpes    Infection    UTI   Multiple sclerosis (HCC)    Pregnancy induced hypertension    2001   Vitamin D deficiency    Past Surgical History:  Procedure Laterality Date   CESAREAN SECTION N/A 03/23/2020   Procedure: CESAREAN SECTION;  Surgeon: Tilda Burrow, MD;  Location: [facility];  Service: Obstetrics;  Laterality: N/A;  Arrest of Dilation   CHOLECYSTECTOMY     LAPAROSCOPY N/A 06/09/2016   Procedure: LAPAROSCOPY OPERATIVE;  Surgeon: Tereso Newcomer, MD;  Location: [facility];  Service: Gynecology;  Laterality: N/A;  ectopic    THERAPEUTIC ABORTION     x 4   TUBAL LIGATION     UNILATERAL SALPINGECTOMY Left 06/09/2016   Procedure: UNILATERAL SALPINGECTOMY, REMOVAL OF IUD;  Surgeon: Tereso Newcomer, MD;  Location: [facility];  Service: Gynecology;  Laterality: Left;   WISDOM TOOTH EXTRACTION     Patient Active Problem List   Diagnosis Date Noted   Urinary urgency 11/04/2022   Gait abnormality 11/04/2022   Other fatigue 11/04/2022   Anemia 11/04/2022   Relapsing remitting multiple sclerosis (HCC) 08/26/2022   Neuropathic pain 09/11/2020   Cesarean delivery, delivered, current hospitalization 03/23/2020   Alpha thalassemia silent carrier 12/18/2019   AMA (advanced maternal age) multigravida 35+ 09/17/2019   Maternal obesity affecting pregnancy, antepartum 09/17/2019   Chronic hypertension affecting pregnancy 09/17/2019   Supervision of other normal pregnancy, antepartum 09/10/2019   GBS bacteriuria 08/19/2019   Multiple sclerosis (HCC) 04/30/2019   Cervical myelopathy (HCC) 04/12/2019   Paresthesia  04/12/2019   Vitamin D deficiency 07/05/2017   Iron deficiency anemia 07/05/2017   Herpes simplex antibody positive 04/15/2016   Recurrent boils 04/13/2016   Family history of breast cancer in first degree relative 04/13/2016   Smoker 07/03/2014    ONSET DATE: 05/31/2023 (referral date)  REFERRING DIAG: G35 (ICD-10-CM) - Multiple sclerosis (HCC) M25.561,M25.562,G89.29 (ICD-10-CM) - Chronic pain of both knees  THERAPY DIAG:  No diagnosis found.  Rationale for Evaluation and Treatment: Rehabilitation  SUBJECTIVE:  SUBJECTIVE STATEMENT: *** Pt reports popping and pain in both knees. Pain has been going on "for a while". Pain is not present when sitting, only when active. Pt has been putting on a topical arthritis cream which is somewhat helpful. "My right side is the worst side", when BLEs are aggravated they can take ~30 minutes to recover.   Pt accompanied by: self  PERTINENT HISTORY: Relapsing-remitting MS diagnosed 5-6 years ago, fatigue, anemia, urinary urgency, cervical myelopathy, cholecystectomy, hx of cesarean section, tubal ligation  PAIN: *** Are you having pain? No; not when sitting  PRECAUTIONS: Fall  RED FLAGS: Bowel or bladder incontinence: No   WEIGHT BEARING RESTRICTIONS: No  FALLS: Has patient fallen in last 6 months? Yes. Number of falls 1; October 19th or 20th, coming down steps at home, was moving too fast, had never fallen down the steps before, R foot may have fallen asleep, did not hit head, twisted ankle, went to ED, received a boot and crutches  LIVING ENVIRONMENT: Lives with: lives with their family; 6 children, 2 adults 2 and 72 Lives in: House/apartment Stairs: Yes: Internal: 1 flight steps; on left going up and External: 1 steps; none Has following equipment at  home: Single point cane, Walker - 2 wheeled, Crutches, and shower chair  PLOF: Requires assistive device for independence and Needs assistance with ADLs "it depends on the day", has not worked since MS relapse in January  PATIENT GOALS: "get stronger", "get off this cane"  OBJECTIVE:  Note: Objective measures were completed at Evaluation unless otherwise noted.  DIAGNOSTIC FINDINGS:  DG Knee Complete 4 Views Right 05/19/23 FINDINGS: No evidence of fracture, dislocation, or joint effusion. The alignment and joint spaces are normal. No evidence of arthropathy or other focal bone abnormality. Soft tissues are unremarkable.  DG Knee Complete 4 Views Left 05/19/23 FINDINGS: No evidence of fracture, dislocation, or joint effusion. The alignment and joint spaces are preserved. No evidence of arthropathy or other focal bone abnormality. Soft tissues are unremarkable.  DG Foot Complete Right 05/29/23 IMPRESSION: 1. Mild dorsal soft tissue swelling along the forefoot. No fracture or malalignment is identified. 2. Small plantar calcaneal spur.  DG Ankle Complete Right 05/29/23 IMPRESSION: 1. No acute bony findings. 2. Plantar calcaneal spur.   SENSATION: Light touch: Impaired "I can barely feel the touch on the R side"  LOWER EXTREMITY ROM:     Passive : measured in supine, hip flexed to 90* Right Eval Left Eval  Hip flexion    Hip extension    Hip abduction    Hip adduction    Hip internal rotation Very limited, ~5* Limited, ~10*  Hip external rotation Very limited, ~10* Limited, ~20*  Knee flexion    Knee extension    Ankle dorsiflexion    Ankle plantarflexion    Ankle inversion    Ankle eversion     (Blank rows = not tested)  LOWER EXTREMITY MMT:    MMT Right Eval Left Eval  Hip flexion 3+, painful 3  Hip extension    Hip abduction 2 2  Hip adduction    Hip internal rotation    Hip external rotation    Knee flexion 3, painful 3  Knee extension 3, painful 3   Ankle dorsiflexion 3 4  Ankle plantarflexion    Ankle inversion    Ankle eversion    (Blank rows = not tested)  BED MOBILITY:  Sit to supine Complete Independence Supine to sit Min A Rolling to Right  Modified independence Rolling to Left Modified independence  TRANSFERS: Assistive device utilized: Single point cane  Sit to stand: Modified independence Stand to sit: Modified independence Floor:  TBA  GAIT: Gait pattern: step through pattern, antalgic, and wide BOS Distance walked: various clinical distances Assistive device utilized: Single point cane Level of assistance: Modified independence Comments: slowly  FUNCTIONAL TESTS:  5 times sit to stand: 72.31 seconds, BUE support, CGA, pt's pain 8/10 Timed up and go (TUG): TBA 10 meter walk test: 32.8 ft over 35.84 seconds= 0.92 ft/sec, SPC, close SBA *** PATIENT SURVEYS:  WOMAC TBA  ***  TODAY'S TREATMENT:                                                                                                                              N/a, eval only ***   PATIENT EDUCATION: *** Education details: POC, exam findings, xray findings Person educated: Patient Education method: Medical illustrator Education comprehension: verbalized understanding and needs further education  HOME EXERCISE PROGRAM: To be initiated ***  GOALS: Goals reviewed with patient? No  SHORT TERM GOALS: Target date: 07/13/2023   Pt will be independent with initial HEP for improved strength, balance, transfers and gait.  Baseline: Goal status: INITIAL  2.  Pt will improve 5 x STS to less than or equal to 60 seconds to demonstrate improved functional strength and transfer efficiency.   Baseline: 72.31 seconds, BUE support, CGA, pt's pain 8/10 (06/22/23) Goal status: INITIAL  3.  Pt will improve gait velocity to at least 1.24 ft/sec w/ LRAD for improved gait efficiency and performance at Mod-I level   Baseline:  0.92 ft/sec, SPC, close SBA  (06/22/23) Goal status: INITIAL  4.  TUG to be assessed and STG set as appropriate *** Baseline:  Goal status: INITIAL  LONG TERM GOALS: Target date: 08/03/2023   Pt will be independent with final HEP for improved strength, balance, transfers and gait.  Baseline:  Goal status: INITIAL  2.  Pt will improve 5 x STS to less than or equal to 45 seconds to demonstrate improved functional strength and transfer efficiency.   Baseline: 72.31 seconds, BUE support, CGA, pt's pain 8/10 (06/22/23) Goal status: INITIAL  3.  Pt will improve gait velocity to at least 1.57 ft/sec w/ LRAD for improved gait efficiency and performance at Mod-I level   Baseline: 0.92 ft/sec, SPC, close SBA (06/22/23) Goal status: INITIAL  4.  TUG to be assessed and LTG set as appropriate *** Baseline:  Goal status: INITIAL  5.  WOMAC to be assessed and LTG set as appropriate *** Baseline:  Goal status: INITIAL  6.  Bilateral hip abduction MMT to improve to at least 3/5 to demonstrate improved functional strength and decreased torque to bilateral knees. Baseline: 2/5 bilaterally (06/22/23) Goal status: INITIAL  7.  Bilateral hip IR and ER ROM to improve by at least 10* to demonstrate improved functional ROM and muscle lengths. Baseline: R IR ~  5*, R ER ~10*, L IR ~10*, L ER ~20* (06/22/23) Goal status: INITIAL  ASSESSMENT:  CLINICAL IMPRESSION: *** Emphasis of skilled PT session on ***. Continue POC.  OBJECTIVE IMPAIRMENTS: Abnormal gait, decreased activity tolerance, decreased balance, decreased endurance, decreased knowledge of condition, decreased knowledge of use of DME, decreased mobility, difficulty walking, decreased ROM, decreased strength, hypomobility, impaired perceived functional ability, increased muscle spasms, impaired flexibility, impaired sensation, improper body mechanics, postural dysfunction, obesity, and pain.   ACTIVITY LIMITATIONS: carrying, lifting, bending, sitting, standing, squatting,  sleeping, stairs, transfers, bathing, dressing, hygiene/grooming, locomotion level, and caring for others  PARTICIPATION LIMITATIONS: meal prep, cleaning, laundry, medication management, driving, shopping, community activity, and occupation  PERSONAL FACTORS: Age, Fitness, Sex, Time since onset of injury/illness/exacerbation, and 3+ comorbidities:   Relapsing-remitting MS diagnosed 5-6 years ago, fatigue, anemia, urinary urgency, cervical myelopathy, cholecystectomy, hx of cesarean section, tubal ligation are also affecting patient's functional outcome.   REHAB POTENTIAL: Fair time since onset of pain, MS relapsing-remiting  CLINICAL DECISION MAKING: Stable/uncomplicated  EVALUATION COMPLEXITY: Low  PLAN:  PT FREQUENCY: 2x/week  PT DURATION: 6 weeks  PLANNED INTERVENTIONS: 97164- PT Re-evaluation, 97110-Therapeutic exercises, 97530- Therapeutic activity, 97112- Neuromuscular re-education, 97535- Self Care, 84696- Manual therapy, [id number]- Gait training, [phone]- Orthotic Fit/training, [phone]- Canalith repositioning, 97014- Electrical stimulation (unattended), [id number]- Electrical stimulation (manual), [id number]- Traction (mechanical), [id number]- Wound care (first 20 sq cm), 97598- Wound care (each additional 20 sq cm), Patient/Family education, Balance training, Stair training, Taping, Dry Needling, Joint mobilization, Joint manipulation, Spinal manipulation, Spinal mobilization, Scar mobilization, Vestibular training, Visual/preceptual remediation/compensation, DME instructions, Cryotherapy, and Moist heat  PLAN FOR NEXT SESSION: Initiate HEP for BLE and core strengthening, balance, NMR, emphasis on hip abduction strengthening (2/5 MMT) as well as mobility for hip IR and ER; assess WOMAC and write LTG, TUG and write STG+LTG; STS w/ anterior weight shift from elevated mat table, quad sets, SLR, prone hamstring curls, calf stretches, quad stretches, piriformis stretch, lumbar spine rotation, cycling ***   For  all possible CPT codes, reference the Planned Interventions line above.     Check all conditions that are expected to impact treatment: {Conditions expected to impact treatment:Neurological condition and/or seizures and Presence of Medical Equipment   If treatment provided at initial evaluation, no treatment charged due to lack of authorization.        Beverely Low, Student-PT 07/06/2023, 8:56 AM

## 2023-07-08 ENCOUNTER — Ambulatory Visit: Payer: Medicaid Other | Admitting: Physical Therapy

## 2023-07-08 ENCOUNTER — Encounter: Payer: Self-pay | Admitting: Physical Therapy

## 2023-07-08 VITALS — BP 134/78 | HR 77

## 2023-07-08 DIAGNOSIS — M6281 Muscle weakness (generalized): Secondary | ICD-10-CM

## 2023-07-08 DIAGNOSIS — M25562 Pain in left knee: Secondary | ICD-10-CM | POA: Diagnosis present

## 2023-07-08 DIAGNOSIS — G8929 Other chronic pain: Secondary | ICD-10-CM | POA: Diagnosis present

## 2023-07-08 DIAGNOSIS — R2689 Other abnormalities of gait and mobility: Secondary | ICD-10-CM | POA: Diagnosis present

## 2023-07-08 DIAGNOSIS — R2681 Unsteadiness on feet: Secondary | ICD-10-CM

## 2023-07-08 DIAGNOSIS — M25561 Pain in right knee: Secondary | ICD-10-CM | POA: Diagnosis present

## 2023-07-08 NOTE — Therapy (Signed)
OUTPATIENT PHYSICAL THERAPY NEURO TREATMENT   Patient Name: Denise Moses MRN: [id number] DOB:29-Sep-1980, 42 y.o., female Today's Date: 07/08/2023   PCP: Ivonne Andrew, NP REFERRING PROVIDER: Persons, [place], [place]  END OF SESSION:  PT End of Session - 07/08/23 1152     Visit Number 2    Number of Visits 13    Date for PT Re-Evaluation 08/17/23    Authorization Type [place] MEDICAID [employer]    Progress Note Due on Visit 10    PT Start Time 1150    PT Stop Time 1233    PT Time Calculation (min) 43 min    Equipment Utilized During Treatment Gait belt    Activity Tolerance Patient tolerated treatment well;Patient limited by pain    Behavior During Therapy WFL for tasks assessed/performed             Past Medical History:  Diagnosis Date   Abnormal MRI, cervical spine    Anemia    Elective abortion    Heart murmur    Herpes    Infection    UTI   Multiple sclerosis (HCC)    Pregnancy induced hypertension    2001   Vitamin D deficiency    Past Surgical History:  Procedure Laterality Date   CESAREAN SECTION N/A 03/23/2020   Procedure: CESAREAN SECTION;  Surgeon: Tilda Burrow, MD;  Location: [facility];  Service: Obstetrics;  Laterality: N/A;  Arrest of Dilation   CHOLECYSTECTOMY     LAPAROSCOPY N/A 06/09/2016   Procedure: LAPAROSCOPY OPERATIVE;  Surgeon: Tereso Newcomer, MD;  Location: [facility];  Service: Gynecology;  Laterality: N/A;  ectopic    THERAPEUTIC ABORTION     x 4   TUBAL LIGATION     UNILATERAL SALPINGECTOMY Left 06/09/2016   Procedure: UNILATERAL SALPINGECTOMY, REMOVAL OF IUD;  Surgeon: Tereso Newcomer, MD;  Location: [facility];  Service: Gynecology;  Laterality: Left;   WISDOM TOOTH EXTRACTION     Patient Active Problem List   Diagnosis Date Noted   Urinary urgency 11/04/2022   Gait abnormality 11/04/2022   Other fatigue 11/04/2022   Anemia 11/04/2022   Relapsing remitting multiple sclerosis (HCC) 08/26/2022   Neuropathic  pain 09/11/2020   Cesarean delivery, delivered, current hospitalization 03/23/2020   Alpha thalassemia silent carrier 12/18/2019   AMA (advanced maternal age) multigravida 35+ 09/17/2019   Maternal obesity affecting pregnancy, antepartum 09/17/2019   Chronic hypertension affecting pregnancy 09/17/2019   Supervision of other normal pregnancy, antepartum 09/10/2019   GBS bacteriuria 08/19/2019   Multiple sclerosis (HCC) 04/30/2019   Cervical myelopathy (HCC) 04/12/2019   Paresthesia 04/12/2019   Vitamin D deficiency 07/05/2017   Iron deficiency anemia 07/05/2017   Herpes simplex antibody positive 04/15/2016   Recurrent boils 04/13/2016   Family history of breast cancer in first degree relative 04/13/2016   Smoker 07/03/2014    ONSET DATE: 05/31/2023 (referral date)  REFERRING DIAG: G35 (ICD-10-CM) - Multiple sclerosis (HCC) M25.561,M25.562,G89.29 (ICD-10-CM) - Chronic pain of both knees  THERAPY DIAG:  Unsteadiness on feet  Other abnormalities of gait and mobility  Muscle weakness (generalized)  Chronic pain of left knee  Chronic pain of right knee  Rationale for Evaluation and Treatment: Rehabilitation  SUBJECTIVE:  SUBJECTIVE STATEMENT: Pt reporting that she is hurting pretty bad today. She denies no new falls or near falls since last here for PT.   Pt accompanied by: self  PERTINENT HISTORY: Relapsing-remitting MS diagnosed 5-6 years ago, fatigue, anemia, urinary urgency, cervical myelopathy, cholecystectomy, hx of cesarean section, tubal ligation  PAIN:  Are you having pain? Yes: NPRS scale: 7.5-8/10 Pain location: low back and R hip Pain description: achy, throbbing Aggravating factors: unsure Relieving factors: when I take my medication ; not when sitting  PRECAUTIONS:  Fall  RED FLAGS: Bowel or bladder incontinence: No   WEIGHT BEARING RESTRICTIONS: No  FALLS: Has patient fallen in last 6 months? Yes. Number of falls 1; October 19th or 20th, coming down steps at home, was moving too fast, had never fallen down the steps before, R foot may have fallen asleep, did not hit head, twisted ankle, went to ED, received a boot and crutches  LIVING ENVIRONMENT: Lives with: lives with their family; 6 children, 2 adults 62 and 62 Lives in: House/apartment Stairs: Yes: Internal: 1 flight steps; on left going up and External: 1 steps; none Has following equipment at home: Single point cane, Walker - 2 wheeled, Crutches, and shower chair  PLOF: Requires assistive device for independence and Needs assistance with ADLs "it depends on the day", has not worked since MS relapse in January  PATIENT GOALS: "get stronger", "get off this cane"  OBJECTIVE:  Note: Objective measures were completed at Evaluation unless otherwise noted.  DIAGNOSTIC FINDINGS:  DG Knee Complete 4 Views Right 05/19/23 FINDINGS: No evidence of fracture, dislocation, or joint effusion. The alignment and joint spaces are normal. No evidence of arthropathy or other focal bone abnormality. Soft tissues are unremarkable.  DG Knee Complete 4 Views Left 05/19/23 FINDINGS: No evidence of fracture, dislocation, or joint effusion. The alignment and joint spaces are preserved. No evidence of arthropathy or other focal bone abnormality. Soft tissues are unremarkable.  DG Foot Complete Right 05/29/23 IMPRESSION: 1. Mild dorsal soft tissue swelling along the forefoot. No fracture or malalignment is identified. 2. Small plantar calcaneal spur.  DG Ankle Complete Right 05/29/23 IMPRESSION: 1. No acute bony findings. 2. Plantar calcaneal spur.   COGNITION: Overall cognitive status: Within functional limits for tasks assessed  TODAY'S TREATMENT:                                                                                                                                Vitals:   07/08/23 1158  BP: 134/78  Pulse: 77  Seated at rest on RUE; elevated but [facility] for therapy   TherAct: Assessed vitals as noted above Discussed MS association cooling and application process in preparation for warmer weather as patient expresses not being able to go outside in the sun at all [facility]: 78% Impairment = 75/96 Functional outcome measure  [facility] PT Assessment - 07/08/23 0001       Standardized Balance Assessment  Standardized Balance Assessment Timed Up and Go Test      Timed Up and Go Test   TUG Normal TUG    Normal TUG (seconds) 35.72   seconds with SPC cane (CGA)            TherEx: SciFit at level 1 x 5 min for gentle ROM LE mobility with trunk support  Reports fatigue 6/10 upon completion Reports good stretch but once completed reporting increased pain  Exercises for HEP: Seated Hamstring Stretch - 3 sets - 30 seconds hold Seated Piriformis Stretch - 3 sets - 30 seconds hold Seated Long Arc Quad - 3 sets - 10 reps  PATIENT EDUCATION: Education details: Initial HEP + cooling vest  Person educated: Patient Education method: Medical illustrator Education comprehension: verbalized understanding and needs further education  HOME EXERCISE PROGRAM: Access Code: [email or web address] URL: [email or web address] Date: 07/08/2023 Prepared by: Maryruth Eve  Exercises - Seated Hamstring Stretch  - 1 x daily - 7 x weekly - 3 sets - 30 seconds hold - Seated Piriformis Stretch  - 1 x daily - 7 x weekly - 3 sets - 30 seconds hold - Seated Long Arc Quad  - 1 x daily - 7 x weekly - 3 sets - 10 reps  GOALS: Goals reviewed with patient? No  SHORT TERM GOALS: Target date: 07/13/2023   Pt will be independent with initial HEP for improved strength, balance, transfers and gait.  Baseline: Goal status: INITIAL  2.  Pt will improve 5 x STS to less than or equal to 60 seconds  to demonstrate improved functional strength and transfer efficiency.   Baseline: 72.31 seconds, BUE support, CGA, pt's pain 8/10 (06/22/23) Goal status: INITIAL  3.  Pt will improve gait velocity to at least 1.24 ft/sec w/ LRAD for improved gait efficiency and performance at Mod-I level   Baseline:  0.92 ft/sec, SPC, close SBA (06/22/23) Goal status: INITIAL  4.  Patient will improve TUG score with LRAD to 30 seconds to indicate clinically significant progress towards a decreased risk of falls and improved mobility.  Baseline: 35.72 seconds with SPC + CGA Goal status: INITIAL  LONG TERM GOALS: Target date: 08/03/2023   Pt will be independent with final HEP for improved strength, balance, transfers and gait.  Baseline:  Goal status: INITIAL  2.  Pt will improve 5 x STS to less than or equal to 45 seconds to demonstrate improved functional strength and transfer efficiency.   Baseline: 72.31 seconds, BUE support, CGA, pt's pain 8/10 (06/22/23) Goal status: INITIAL  3.  Pt will improve gait velocity to at least 1.57 ft/sec w/ LRAD for improved gait efficiency and performance at Mod-I level   Baseline: 0.92 ft/sec, SPC, close SBA (06/22/23) Goal status: INITIAL  4.  Patient will improve TUG score to 27 seconds with LRAD or less to indicate a decreased risk of falls and demonstrate improved overall mobility.   Baseline: 35.72 seconds with SPC + CGA Goal status: INITIAL  5. Patient will improve WOMAC score too 67% impairment or less indicate a clinically important improvement in LE function.   Baseline: 78% impairment Goal status: INITIAL  6.  Bilateral hip abduction MMT to improve to at least 3/5 to demonstrate improved functional strength and decreased torque to bilateral knees. Baseline: 2/5 bilaterally (06/22/23) Goal status: INITIAL  7.  Bilateral hip IR and ER ROM to improve by at least 10* to demonstrate improved functional ROM and muscle lengths. Baseline: R IR ~5*, R ER ~  10*,  L IR ~10*, L ER ~20* (06/22/23) Goal status: INITIAL  ASSESSMENT:  CLINICAL IMPRESSION: Patient seen for skilled PT session with emphasis on LE ROM to manage pain in addition to creation of initial HEP, assessment of outcomes measures, and recommendations on symptom management with use of cooling vest when weather warms up. Patient limited by pain but reports improvements by end of session. Patient at a high risk for falls as indicated by TUG and high level of impairment on WOMAC. Continue POC to progress towards LTGs.   OBJECTIVE IMPAIRMENTS: Abnormal gait, decreased activity tolerance, decreased balance, decreased endurance, decreased knowledge of condition, decreased knowledge of use of DME, decreased mobility, difficulty walking, decreased ROM, decreased strength, hypomobility, impaired perceived functional ability, increased muscle spasms, impaired flexibility, impaired sensation, improper body mechanics, postural dysfunction, obesity, and pain.   ACTIVITY LIMITATIONS: carrying, lifting, bending, sitting, standing, squatting, sleeping, stairs, transfers, bathing, dressing, hygiene/grooming, locomotion level, and caring for others  PARTICIPATION LIMITATIONS: meal prep, cleaning, laundry, medication management, driving, shopping, community activity, and occupation  PERSONAL FACTORS: Age, Fitness, Sex, Time since onset of injury/illness/exacerbation, and 3+ comorbidities:   Relapsing-remitting MS diagnosed 5-6 years ago, fatigue, anemia, urinary urgency, cervical myelopathy, cholecystectomy, hx of cesarean section, tubal ligation are also affecting patient's functional outcome.   REHAB POTENTIAL: Fair time since onset of pain, MS relapsing-remiting  CLINICAL DECISION MAKING: Stable/uncomplicated  EVALUATION COMPLEXITY: Low  PLAN:  PT FREQUENCY: 2x/week  PT DURATION: 6 weeks  PLANNED INTERVENTIONS: 97164- PT Re-evaluation, 97110-Therapeutic exercises, 97530- Therapeutic activity, 97112-  Neuromuscular re-education, 97535- Self Care, 16109- Manual therapy, [id number]- Gait training, [phone]- Orthotic Fit/training, [phone]- Canalith repositioning, 97014- Electrical stimulation (unattended), [id number]- Electrical stimulation (manual), [id number]- Traction (mechanical), [id number]- Wound care (first 20 sq cm), 97598- Wound care (each additional 20 sq cm), Patient/Family education, Balance training, Stair training, Taping, Dry Needling, Joint mobilization, Joint manipulation, Spinal manipulation, Spinal mobilization, Scar mobilization, Vestibular training, Visual/preceptual remediation/compensation, DME instructions, Cryotherapy, and Moist heat  PLAN FOR NEXT SESSION: Progress HEP for BLE and core strengthening, balance, NMR, emphasis on hip abduction strengthening (2/5 MMT) as well as mobility for hip IR and ER; STS w/ anterior weight shift from elevated mat table, quad sets, SLR, prone hamstring curls, calf stretches, quad stretches, piriformis stretch, lumbar spine rotation, cycling    For all possible CPT codes, reference the Planned Interventions line above.     Check all conditions that are expected to impact treatment: {Conditions expected to impact treatment:Neurological condition and/or seizures and Presence of Medical Equipment   If treatment provided at initial evaluation, no treatment charged due to lack of authorization.      Carmelia Bake, PT, DPT 07/08/2023, 12:44 PM

## 2023-07-13 ENCOUNTER — Ambulatory Visit: Payer: Medicaid Other | Admitting: Physical Therapy

## 2023-07-13 VITALS — BP 137/95 | HR 77

## 2023-07-13 DIAGNOSIS — R2681 Unsteadiness on feet: Secondary | ICD-10-CM | POA: Diagnosis not present

## 2023-07-13 DIAGNOSIS — G8929 Other chronic pain: Secondary | ICD-10-CM

## 2023-07-13 DIAGNOSIS — M6281 Muscle weakness (generalized): Secondary | ICD-10-CM

## 2023-07-13 DIAGNOSIS — R2689 Other abnormalities of gait and mobility: Secondary | ICD-10-CM

## 2023-07-13 NOTE — Therapy (Signed)
OUTPATIENT PHYSICAL THERAPY NEURO TREATMENT   Patient Name: Denise Moses MRN: [id number] DOB:September 27, 1980, 42 y.o., female Today's Date: 07/13/2023   PCP: Ivonne Andrew, NP REFERRING PROVIDER: Persons, [place], [place]  END OF SESSION:  PT End of Session - 07/13/23 1147     Visit Number 3    Number of Visits 13    Date for PT Re-Evaluation 08/17/23    Authorization Type [place] MEDICAID [employer]    Progress Note Due on Visit 10    PT Start Time 1145    PT Stop Time 1224    PT Time Calculation (min) 39 min    Equipment Utilized During Treatment Gait belt    Activity Tolerance Patient tolerated treatment well;Patient limited by pain    Behavior During Therapy WFL for tasks assessed/performed              Past Medical History:  Diagnosis Date   Abnormal MRI, cervical spine    Anemia    Elective abortion    Heart murmur    Herpes    Infection    UTI   Multiple sclerosis (HCC)    Pregnancy induced hypertension    2001   Vitamin D deficiency    Past Surgical History:  Procedure Laterality Date   CESAREAN SECTION N/A 03/23/2020   Procedure: CESAREAN SECTION;  Surgeon: Tilda Burrow, MD;  Location: [facility];  Service: Obstetrics;  Laterality: N/A;  Arrest of Dilation   CHOLECYSTECTOMY     LAPAROSCOPY N/A 06/09/2016   Procedure: LAPAROSCOPY OPERATIVE;  Surgeon: Tereso Newcomer, MD;  Location: [facility];  Service: Gynecology;  Laterality: N/A;  ectopic    THERAPEUTIC ABORTION     x 4   TUBAL LIGATION     UNILATERAL SALPINGECTOMY Left 06/09/2016   Procedure: UNILATERAL SALPINGECTOMY, REMOVAL OF IUD;  Surgeon: Tereso Newcomer, MD;  Location: [facility];  Service: Gynecology;  Laterality: Left;   WISDOM TOOTH EXTRACTION     Patient Active Problem List   Diagnosis Date Noted   Urinary urgency 11/04/2022   Gait abnormality 11/04/2022   Other fatigue 11/04/2022   Anemia 11/04/2022   Relapsing remitting multiple sclerosis (HCC) 08/26/2022    Neuropathic pain 09/11/2020   Cesarean delivery, delivered, current hospitalization 03/23/2020   Alpha thalassemia silent carrier 12/18/2019   AMA (advanced maternal age) multigravida 35+ 09/17/2019   Maternal obesity affecting pregnancy, antepartum 09/17/2019   Chronic hypertension affecting pregnancy 09/17/2019   Supervision of other normal pregnancy, antepartum 09/10/2019   GBS bacteriuria 08/19/2019   Multiple sclerosis (HCC) 04/30/2019   Cervical myelopathy (HCC) 04/12/2019   Paresthesia 04/12/2019   Vitamin D deficiency 07/05/2017   Iron deficiency anemia 07/05/2017   Herpes simplex antibody positive 04/15/2016   Recurrent boils 04/13/2016   Family history of breast cancer in first degree relative 04/13/2016   Smoker 07/03/2014    ONSET DATE: 05/31/2023 (referral date)  REFERRING DIAG: G35 (ICD-10-CM) - Multiple sclerosis (HCC) M25.561,M25.562,G89.29 (ICD-10-CM) - Chronic pain of both knees  THERAPY DIAG:  Unsteadiness on feet  Other abnormalities of gait and mobility  Muscle weakness (generalized)  Chronic pain of left knee  Chronic pain of right knee  Rationale for Evaluation and Treatment: Rehabilitation  SUBJECTIVE:  SUBJECTIVE STATEMENT: Pt initially reports that she is waiting on insurance approval to get her MS treatment/medication that she typically gets overy 6 months. However, pt gets a call during this session from [facility] letting her know that she will be able to have her treatment on 12/31. Pt reports that not having had her MS medication and she can "feel it in her body". Pt reports that her pain has not been doing good, she has pain mostly on her R side which is the side affected by MS.  Pt reports no recent falls. She reports she has been able to work on her HEP a little  bit.  Pt reports she has put in her application for a cooling vest.  Pt accompanied by: self  PERTINENT HISTORY: Relapsing-remitting MS diagnosed 5-6 years ago, fatigue, anemia, urinary urgency, cervical myelopathy, cholecystectomy, hx of cesarean section, tubal ligation  PAIN:  Are you having pain? Yes: NPRS scale: 6.5/10 Pain location: low back and R hip Pain description: achy, throbbing Aggravating factors: unsure Relieving factors: when I take my medication ; not when sitting  PRECAUTIONS: Fall  RED FLAGS: Bowel or bladder incontinence: No   WEIGHT BEARING RESTRICTIONS: No  FALLS: Has patient fallen in last 6 months? Yes. Number of falls 1; October 19th or 20th, coming down steps at home, was moving too fast, had never fallen down the steps before, R foot may have fallen asleep, did not hit head, twisted ankle, went to ED, received a boot and crutches  LIVING ENVIRONMENT: Lives with: lives with their family; 6 children, 2 adults 75 and 45 Lives in: House/apartment Stairs: Yes: Internal: 1 flight steps; on left going up and External: 1 steps; none Has following equipment at home: Single point cane, Walker - 2 wheeled, Crutches, and shower chair  PLOF: Requires assistive device for independence and Needs assistance with ADLs "it depends on the day", has not worked since MS relapse in January  PATIENT GOALS: "get stronger", "get off this cane"  OBJECTIVE:  Note: Objective measures were completed at Evaluation unless otherwise noted.  DIAGNOSTIC FINDINGS:  DG Knee Complete 4 Views Right 05/19/23 FINDINGS: No evidence of fracture, dislocation, or joint effusion. The alignment and joint spaces are normal. No evidence of arthropathy or other focal bone abnormality. Soft tissues are unremarkable.  DG Knee Complete 4 Views Left 05/19/23 FINDINGS: No evidence of fracture, dislocation, or joint effusion. The alignment and joint spaces are preserved. No evidence of  arthropathy or other focal bone abnormality. Soft tissues are unremarkable.  DG Foot Complete Right 05/29/23 IMPRESSION: 1. Mild dorsal soft tissue swelling along the forefoot. No fracture or malalignment is identified. 2. Small plantar calcaneal spur.  DG Ankle Complete Right 05/29/23 IMPRESSION: 1. No acute bony findings. 2. Plantar calcaneal spur.   COGNITION: Overall cognitive status: Within functional limits for tasks assessed  TODAY'S TREATMENT:  TherAct Vitals:   07/13/23 1153  BP: (!) 137/95  Pulse: 77  Assessed on RUE while seated at start of therapy session. BP elevated but within limits for participation in therapy session.  For STG assessment:  [facility] PT Assessment - 07/13/23 1156       Ambulation/Gait   Gait velocity 32.8 ft over 40.75 sec = 0.8 ft/sec      Standardized Balance Assessment   Standardized Balance Assessment Timed Up and Go Test;Five Times Sit to Stand    Five times sit to stand comments  41.66 sec   BUE support     Timed Up and Go Test   TUG Normal TUG    Normal TUG (seconds) 40.25   with SPC and SBA             TherEx: Sidelying clams x 10 reps B Manual cues to keep hips from rotating backwards Supine marches with AAROM from sheet x 10 reps B Supine bridges x 5 reps, very difficult  PATIENT EDUCATION: Education details: continue HEP, added to HEP Person educated: Patient Education method: Programmer, multimedia, Demonstration, Tactile cues, Verbal cues, and Handouts Education comprehension: verbalized understanding, returned demonstration, and needs further education  HOME EXERCISE PROGRAM: Access Code: [email or web address] URL: [email or web address] Date: 07/08/2023 Prepared by: Maryruth Eve  Exercises - Seated Hamstring Stretch  - 1 x daily - 7 x weekly - 3 sets - 30 seconds hold - Seated Piriformis Stretch   - 1 x daily - 7 x weekly - 3 sets - 30 seconds hold - Seated Long Arc Quad  - 1 x daily - 7 x weekly - 3 sets - 10 reps - Clamshell  - 1 x daily - 7 x weekly - 3 sets - 5-10 reps - Supine March  - 1 x daily - 7 x weekly - 3 sets - 10 reps  GOALS: Goals reviewed with patient? No  SHORT TERM GOALS: Target date: 07/13/2023  Pt will be independent with initial HEP for improved strength, balance, transfers and gait.  Baseline: Goal status: MET  2.  Pt will improve 5 x STS to less than or equal to 60 seconds to demonstrate improved functional strength and transfer efficiency.   Baseline: 72.31 seconds, BUE support, CGA, pt's pain 8/10 (06/22/23), 41.66 sec BUE support (12/11) Goal status: MET  3.  Pt will improve gait velocity to at least 1.24 ft/sec w/ LRAD for improved gait efficiency and performance at Mod-I level   Baseline:  0.92 ft/sec, SPC, close SBA (06/22/23), 0.8 ft/sec SPC SBA (12/11) Goal status: NOT MET  4.  Patient will improve TUG score with LRAD to 30 seconds to indicate clinically significant progress towards a decreased risk of falls and improved mobility.  Baseline: 35.72 seconds with SPC + CGA, 40.25 sec with SPC and SBA (12/11) Goal status: NOT MET  LONG TERM GOALS: Target date: 08/03/2023   Pt will be independent with final HEP for improved strength, balance, transfers and gait.  Baseline:  Goal status: INITIAL  2.  Pt will improve 5 x STS to less than or equal to 45 seconds to demonstrate improved functional strength and transfer efficiency.   Baseline: 72.31 seconds, BUE support, CGA, pt's pain 8/10 (06/22/23), 41.66 sec BUE support (12/11) Goal status: INITIAL  3.  Pt will improve gait velocity to at least 1.57 ft/sec w/ LRAD for improved gait efficiency and performance at Mod-I level   Baseline: 0.92 ft/sec, SPC, close SBA (06/22/23), 0.8 ft/sec SPC SBA (12/11) Goal  status: INITIAL  4.  Patient will improve TUG score to 27 seconds with LRAD or less to  indicate a decreased risk of falls and demonstrate improved overall mobility.   Baseline: 35.72 seconds with SPC + CGA, 40.25 sec with SPC and SBA (12/11) Goal status: INITIAL  5. Patient will improve WOMAC score too 67% impairment or less indicate a clinically important improvement in LE function.   Baseline: 78% impairment Goal status: INITIAL  6.  Bilateral hip abduction MMT to improve to at least 3/5 to demonstrate improved functional strength and decreased torque to bilateral knees. Baseline: 2/5 bilaterally (06/22/23) Goal status: INITIAL  7.  Bilateral hip IR and ER ROM to improve by at least 10* to demonstrate improved functional ROM and muscle lengths. Baseline: R IR ~5*, R ER ~10*, L IR ~10*, L ER ~20* (06/22/23) Goal status: INITIAL  ASSESSMENT:  CLINICAL IMPRESSION: Emphasis of skilled PT session on assessing STG and adding strengthening exercises to HEP. Pt has met 2/4 STG due to being independent with her initial HEP and demonstrating improved BLE strength based on improved 5xSTS score this date. Pt exhibits ongoing increased fall risk based on her decreased gait speed and increased time needed to complete TUG this date, however pt is also having increased pain in her joints and due to her MS this date as well as she has not been able to receive her MS medication in a timely manner due to insurance issues. Pt continues to benefit from skilled therapy services to work towards increased independence with management of her pain symptoms. Continue POC.   OBJECTIVE IMPAIRMENTS: Abnormal gait, decreased activity tolerance, decreased balance, decreased endurance, decreased knowledge of condition, decreased knowledge of use of DME, decreased mobility, difficulty walking, decreased ROM, decreased strength, hypomobility, impaired perceived functional ability, increased muscle spasms, impaired flexibility, impaired sensation, improper body mechanics, postural dysfunction, obesity, and pain.    ACTIVITY LIMITATIONS: carrying, lifting, bending, sitting, standing, squatting, sleeping, stairs, transfers, bathing, dressing, hygiene/grooming, locomotion level, and caring for others  PARTICIPATION LIMITATIONS: meal prep, cleaning, laundry, medication management, driving, shopping, community activity, and occupation  PERSONAL FACTORS: Age, Fitness, Sex, Time since onset of injury/illness/exacerbation, and 3+ comorbidities:   Relapsing-remitting MS diagnosed 5-6 years ago, fatigue, anemia, urinary urgency, cervical myelopathy, cholecystectomy, hx of cesarean section, tubal ligation are also affecting patient's functional outcome.   REHAB POTENTIAL: Fair time since onset of pain, MS relapsing-remiting  CLINICAL DECISION MAKING: Stable/uncomplicated  EVALUATION COMPLEXITY: Low  PLAN:  PT FREQUENCY: 2x/week  PT DURATION: 6 weeks  PLANNED INTERVENTIONS: 97164- PT Re-evaluation, 97110-Therapeutic exercises, 97530- Therapeutic activity, 97112- Neuromuscular re-education, 97535- Self Care, 95188- Manual therapy, [id number]- Gait training, [phone]- Orthotic Fit/training, [phone]- Canalith repositioning, 97014- Electrical stimulation (unattended), [id number]- Electrical stimulation (manual), [id number]- Traction (mechanical), [id number]- Wound care (first 20 sq cm), 97598- Wound care (each additional 20 sq cm), Patient/Family education, Balance training, Stair training, Taping, Dry Needling, Joint mobilization, Joint manipulation, Spinal manipulation, Spinal mobilization, Scar mobilization, Vestibular training, Visual/preceptual remediation/compensation, DME instructions, Cryotherapy, and Moist heat  PLAN FOR NEXT SESSION: Progress HEP for BLE and core strengthening, balance, NMR, emphasis on hip abduction strengthening (2/5 MMT) as well as mobility for hip IR and ER; STS w/ anterior weight shift from elevated mat table, quad sets, SLR, prone hamstring curls, calf stretches, quad stretches, piriformis stretch, lumbar spine  rotation, cycling    For all possible CPT codes, reference the Planned Interventions line above.     Check all conditions that are expected to impact  treatment: {Conditions expected to impact treatment:Neurological condition and/or seizures and Presence of Medical Equipment   If treatment provided at initial evaluation, no treatment charged due to lack of authorization.      Peter Congo, PT Peter Congo, PT, DPT, CSRS  07/13/2023, 12:25 PM

## 2023-07-15 ENCOUNTER — Ambulatory Visit: Payer: Medicaid Other | Admitting: Physical Therapy

## 2023-07-18 ENCOUNTER — Telehealth: Payer: Self-pay | Admitting: Physician Assistant

## 2023-07-18 NOTE — Telephone Encounter (Signed)
Left vm that provider PA Persons will not be I office this week and pt need to reschedule Persons earliest availability

## 2023-07-19 ENCOUNTER — Ambulatory Visit: Payer: Medicaid Other | Admitting: Physician Assistant

## 2023-07-20 ENCOUNTER — Ambulatory Visit: Payer: Medicaid Other | Admitting: Physical Therapy

## 2023-07-20 VITALS — BP 137/96 | HR 74

## 2023-07-20 DIAGNOSIS — R2681 Unsteadiness on feet: Secondary | ICD-10-CM | POA: Diagnosis not present

## 2023-07-20 DIAGNOSIS — R2689 Other abnormalities of gait and mobility: Secondary | ICD-10-CM

## 2023-07-20 DIAGNOSIS — G8929 Other chronic pain: Secondary | ICD-10-CM

## 2023-07-20 DIAGNOSIS — M6281 Muscle weakness (generalized): Secondary | ICD-10-CM

## 2023-07-20 NOTE — Therapy (Signed)
OUTPATIENT PHYSICAL THERAPY NEURO TREATMENT   Patient Name: Denise Moses MRN: [id number] DOB:1980/10/21, 42 y.o., female Today's Date: 07/20/2023   PCP: Ivonne Andrew, NP REFERRING PROVIDER: Persons, [place], [place]  END OF SESSION:  PT End of Session - 07/20/23 1151     Visit Number 4    Number of Visits 13    Date for PT Re-Evaluation 08/17/23    Authorization Type [place] MEDICAID [employer]    Progress Note Due on Visit 10    PT Start Time 1150   pt arrived late   PT Stop Time 1228    PT Time Calculation (min) 38 min    Equipment Utilized During Treatment Gait belt    Activity Tolerance Patient tolerated treatment well;Patient limited by pain    Behavior During Therapy WFL for tasks assessed/performed               Past Medical History:  Diagnosis Date   Abnormal MRI, cervical spine    Anemia    Elective abortion    Heart murmur    Herpes    Infection    UTI   Multiple sclerosis (HCC)    Pregnancy induced hypertension    2001   Vitamin D deficiency    Past Surgical History:  Procedure Laterality Date   CESAREAN SECTION N/A 03/23/2020   Procedure: CESAREAN SECTION;  Surgeon: Tilda Burrow, MD;  Location: [facility];  Service: Obstetrics;  Laterality: N/A;  Arrest of Dilation   CHOLECYSTECTOMY     LAPAROSCOPY N/A 06/09/2016   Procedure: LAPAROSCOPY OPERATIVE;  Surgeon: Tereso Newcomer, MD;  Location: [facility];  Service: Gynecology;  Laterality: N/A;  ectopic    THERAPEUTIC ABORTION     x 4   TUBAL LIGATION     UNILATERAL SALPINGECTOMY Left 06/09/2016   Procedure: UNILATERAL SALPINGECTOMY, REMOVAL OF IUD;  Surgeon: Tereso Newcomer, MD;  Location: [facility];  Service: Gynecology;  Laterality: Left;   WISDOM TOOTH EXTRACTION     Patient Active Problem List   Diagnosis Date Noted   Urinary urgency 11/04/2022   Gait abnormality 11/04/2022   Other fatigue 11/04/2022   Anemia 11/04/2022   Relapsing remitting multiple sclerosis (HCC)  08/26/2022   Neuropathic pain 09/11/2020   Cesarean delivery, delivered, current hospitalization 03/23/2020   Alpha thalassemia silent carrier 12/18/2019   AMA (advanced maternal age) multigravida 35+ 09/17/2019   Maternal obesity affecting pregnancy, antepartum 09/17/2019   Chronic hypertension affecting pregnancy 09/17/2019   Supervision of other normal pregnancy, antepartum 09/10/2019   GBS bacteriuria 08/19/2019   Multiple sclerosis (HCC) 04/30/2019   Cervical myelopathy (HCC) 04/12/2019   Paresthesia 04/12/2019   Vitamin D deficiency 07/05/2017   Iron deficiency anemia 07/05/2017   Herpes simplex antibody positive 04/15/2016   Recurrent boils 04/13/2016   Family history of breast cancer in first degree relative 04/13/2016   Smoker 07/03/2014    ONSET DATE: 05/31/2023 (referral date)  REFERRING DIAG: G35 (ICD-10-CM) - Multiple sclerosis (HCC) M25.561,M25.562,G89.29 (ICD-10-CM) - Chronic pain of both knees  THERAPY DIAG:  Unsteadiness on feet  Other abnormalities of gait and mobility  Muscle weakness (generalized)  Chronic pain of left knee  Chronic pain of right knee  Rationale for Evaluation and Treatment: Rehabilitation  SUBJECTIVE:  SUBJECTIVE STATEMENT: Pt's aunt passed away this weekend, feeling very down and states it has been hard to do much of anything. Pt reports she was very exhausted after last PT session.  Pt did receive her cooling vest and some other items from [employer].  Pt accompanied by: self  PERTINENT HISTORY: Relapsing-remitting MS diagnosed 5-6 years ago, fatigue, anemia, urinary urgency, cervical myelopathy, cholecystectomy, hx of cesarean section, tubal ligation  PAIN:  Are you having pain? Yes: NPRS scale: 7/10 Pain location: low back and R hip Pain  description: achy, throbbing Aggravating factors: unsure Relieving factors: when I take my medication ; not when sitting  PRECAUTIONS: Fall  RED FLAGS: Bowel or bladder incontinence: No   WEIGHT BEARING RESTRICTIONS: No  FALLS: Has patient fallen in last 6 months? Yes. Number of falls 1; October 19th or 20th, coming down steps at home, was moving too fast, had never fallen down the steps before, R foot may have fallen asleep, did not hit head, twisted ankle, went to ED, received a boot and crutches  LIVING ENVIRONMENT: Lives with: lives with their family; 6 children, 2 adults 19 and 36 Lives in: House/apartment Stairs: Yes: Internal: 1 flight steps; on left going up and External: 1 steps; none Has following equipment at home: Single point cane, Walker - 2 wheeled, Crutches, and shower chair  PLOF: Requires assistive device for independence and Needs assistance with ADLs "it depends on the day", has not worked since MS relapse in January  PATIENT GOALS: "get stronger", "get off this cane"  OBJECTIVE:  Note: Objective measures were completed at Evaluation unless otherwise noted.  DIAGNOSTIC FINDINGS:  DG Knee Complete 4 Views Right 05/19/23 FINDINGS: No evidence of fracture, dislocation, or joint effusion. The alignment and joint spaces are normal. No evidence of arthropathy or other focal bone abnormality. Soft tissues are unremarkable.  DG Knee Complete 4 Views Left 05/19/23 FINDINGS: No evidence of fracture, dislocation, or joint effusion. The alignment and joint spaces are preserved. No evidence of arthropathy or other focal bone abnormality. Soft tissues are unremarkable.  DG Foot Complete Right 05/29/23 IMPRESSION: 1. Mild dorsal soft tissue swelling along the forefoot. No fracture or malalignment is identified. 2. Small plantar calcaneal spur.  DG Ankle Complete Right 05/29/23 IMPRESSION: 1. No acute bony findings. 2. Plantar calcaneal  spur.   COGNITION: Overall cognitive status: Within functional limits for tasks assessed  TODAY'S TREATMENT:                                                                                                                               TherAct Vitals:   07/20/23 1156  BP: (!) 137/96  Pulse: 74   Assessed on LUE while seated at start of therapy session. BP elevated but within limits for participation in therapy session.  TherEx: SciFit level 1 for 5 minutes using BUE/BLEs for neural priming for reciprocal movement, dynamic cardiovascular warmup and increased amplitude of stepping. RPE  of 6/10 following activity.  Seated anterior leans on blue therapy ball 5 x 30 sec each + anteriolateral leans x 5 reps each direction Long-sitting butterfly stretch 3 x 30 sec B Supine hip IR/ER windshield wipers 2 x 10 reps  PATIENT EDUCATION: Education details: continue HEP, added to HEP Person educated: Patient Education method: Programmer, multimedia, Demonstration, Actor cues, Verbal cues, and Handouts Education comprehension: verbalized understanding, returned demonstration, and needs further education  HOME EXERCISE PROGRAM: Access Code: [email or web address] URL: [email or web address] Date: 07/08/2023 Prepared by: Maryruth Eve  Exercises - Seated Hamstring Stretch  - 1 x daily - 7 x weekly - 3 sets - 30 seconds hold - Seated Piriformis Stretch  - 1 x daily - 7 x weekly - 3 sets - 30 seconds hold - Seated Long Arc Quad  - 1 x daily - 7 x weekly - 3 sets - 10 reps - Clamshell  - 1 x daily - 7 x weekly - 3 sets - 5-10 reps - Supine March  - 1 x daily - 7 x weekly - 3 sets - 10 reps - Seated Flexion Stretch with Swiss Ball  - 1 x daily - 7 x weekly - 1 sets - 5 reps - 30 sec hold - Seated Thoracic Flexion and Rotation with Swiss Ball  - 1 x daily - 7 x weekly - 1 sets - 5 reps - 30 sec hold  GOALS: Goals reviewed with patient? No  SHORT TERM GOALS: Target date: 07/13/2023  Pt will be  independent with initial HEP for improved strength, balance, transfers and gait.  Baseline: Goal status: MET  2.  Pt will improve 5 x STS to less than or equal to 60 seconds to demonstrate improved functional strength and transfer efficiency.   Baseline: 72.31 seconds, BUE support, CGA, pt's pain 8/10 (06/22/23), 41.66 sec BUE support (12/11) Goal status: MET  3.  Pt will improve gait velocity to at least 1.24 ft/sec w/ LRAD for improved gait efficiency and performance at Mod-I level   Baseline:  0.92 ft/sec, SPC, close SBA (06/22/23), 0.8 ft/sec SPC SBA (12/11) Goal status: NOT MET  4.  Patient will improve TUG score with LRAD to 30 seconds to indicate clinically significant progress towards a decreased risk of falls and improved mobility.  Baseline: 35.72 seconds with SPC + CGA, 40.25 sec with SPC and SBA (12/11) Goal status: NOT MET  LONG TERM GOALS: Target date: 08/03/2023   Pt will be independent with final HEP for improved strength, balance, transfers and gait.  Baseline:  Goal status: INITIAL  2.  Pt will improve 5 x STS to less than or equal to 45 seconds to demonstrate improved functional strength and transfer efficiency.   Baseline: 72.31 seconds, BUE support, CGA, pt's pain 8/10 (06/22/23), 41.66 sec BUE support (12/11) Goal status: INITIAL  3.  Pt will improve gait velocity to at least 1.57 ft/sec w/ LRAD for improved gait efficiency and performance at Mod-I level   Baseline: 0.92 ft/sec, SPC, close SBA (06/22/23), 0.8 ft/sec SPC SBA (12/11) Goal status: INITIAL  4.  Patient will improve TUG score to 27 seconds with LRAD or less to indicate a decreased risk of falls and demonstrate improved overall mobility.   Baseline: 35.72 seconds with SPC + CGA, 40.25 sec with SPC and SBA (12/11) Goal status: INITIAL  5. Patient will improve WOMAC score too 67% impairment or less indicate a clinically important improvement in LE function.   Baseline: 78% impairment Goal status:  INITIAL  6.  Bilateral hip abduction MMT to improve to at least 3/5 to demonstrate improved functional strength and decreased torque to bilateral knees. Baseline: 2/5 bilaterally (06/22/23) Goal status: INITIAL  7.  Bilateral hip IR and ER ROM to improve by at least 10* to demonstrate improved functional ROM and muscle lengths. Baseline: R IR ~5*, R ER ~10*, L IR ~10*, L ER ~20* (06/22/23) Goal status: INITIAL  ASSESSMENT:  CLINICAL IMPRESSION: Emphasis of skilled PT session on continuing to work on gentle therex to address ongoing pain and general strengthening impairments. Pt again with fair tolerance for therapy and limited in her tolerance due to pain. Pt is willing and motivated to participate in her therapy session as able. Pt continues to benefit from skilled therapy services to work towards increased independence with management of her pain and her MS symptoms. Continue POC.   OBJECTIVE IMPAIRMENTS: Abnormal gait, decreased activity tolerance, decreased balance, decreased endurance, decreased knowledge of condition, decreased knowledge of use of DME, decreased mobility, difficulty walking, decreased ROM, decreased strength, hypomobility, impaired perceived functional ability, increased muscle spasms, impaired flexibility, impaired sensation, improper body mechanics, postural dysfunction, obesity, and pain.   ACTIVITY LIMITATIONS: carrying, lifting, bending, sitting, standing, squatting, sleeping, stairs, transfers, bathing, dressing, hygiene/grooming, locomotion level, and caring for others  PARTICIPATION LIMITATIONS: meal prep, cleaning, laundry, medication management, driving, shopping, community activity, and occupation  PERSONAL FACTORS: Age, Fitness, Sex, Time since onset of injury/illness/exacerbation, and 3+ comorbidities:   Relapsing-remitting MS diagnosed 5-6 years ago, fatigue, anemia, urinary urgency, cervical myelopathy, cholecystectomy, hx of cesarean section, tubal  ligation are also affecting patient's functional outcome.   REHAB POTENTIAL: Fair time since onset of pain, MS relapsing-remiting  CLINICAL DECISION MAKING: Stable/uncomplicated  EVALUATION COMPLEXITY: Low  PLAN:  PT FREQUENCY: 2x/week  PT DURATION: 6 weeks  PLANNED INTERVENTIONS: 97164- PT Re-evaluation, 97110-Therapeutic exercises, 97530- Therapeutic activity, 97112- Neuromuscular re-education, 97535- Self Care, 09811- Manual therapy, [id number]- Gait training, [phone]- Orthotic Fit/training, [phone]- Canalith repositioning, 97014- Electrical stimulation (unattended), [id number]- Electrical stimulation (manual), [id number]- Traction (mechanical), [id number]- Wound care (first 20 sq cm), 97598- Wound care (each additional 20 sq cm), Patient/Family education, Balance training, Stair training, Taping, Dry Needling, Joint mobilization, Joint manipulation, Spinal manipulation, Spinal mobilization, Scar mobilization, Vestibular training, Visual/preceptual remediation/compensation, DME instructions, Cryotherapy, and Moist heat  PLAN FOR NEXT SESSION: Progress HEP for BLE and core strengthening, balance, NMR, emphasis on hip abduction strengthening (2/5 MMT) as well as mobility for hip IR and ER; STS w/ anterior weight shift from elevated mat table, quad sets, SLR, prone hamstring curls, calf stretches, quad stretches, piriformis stretch, lumbar spine rotation, cycling    For all possible CPT codes, reference the Planned Interventions line above.     Check all conditions that are expected to impact treatment: {Conditions expected to impact treatment:Neurological condition and/or seizures and Presence of Medical Equipment   If treatment provided at initial evaluation, no treatment charged due to lack of authorization.      Peter Congo, PT Peter Congo, PT, DPT, CSRS  07/20/2023, 12:42 PM

## 2023-07-21 ENCOUNTER — Ambulatory Visit: Payer: Medicaid Other | Admitting: Physician Assistant

## 2023-07-22 ENCOUNTER — Ambulatory Visit: Payer: Medicaid Other | Admitting: Physical Therapy

## 2023-07-26 ENCOUNTER — Encounter: Payer: Self-pay | Admitting: Physical Therapy

## 2023-07-26 ENCOUNTER — Telehealth: Payer: Self-pay | Admitting: Physical Therapy

## 2023-07-26 ENCOUNTER — Ambulatory Visit: Payer: Medicaid Other | Admitting: Physical Therapy

## 2023-07-26 VITALS — BP 151/93 | HR 65

## 2023-07-26 DIAGNOSIS — G8929 Other chronic pain: Secondary | ICD-10-CM

## 2023-07-26 DIAGNOSIS — M6281 Muscle weakness (generalized): Secondary | ICD-10-CM

## 2023-07-26 DIAGNOSIS — R2689 Other abnormalities of gait and mobility: Secondary | ICD-10-CM

## 2023-07-26 DIAGNOSIS — R2681 Unsteadiness on feet: Secondary | ICD-10-CM

## 2023-07-26 NOTE — Therapy (Signed)
OUTPATIENT PHYSICAL THERAPY NEURO TREATMENT   Patient Name: Denise Moses MRN: [id number] DOB:1981/02/14, 42 y.o., female Today's Date: 07/26/2023   PCP: Ivonne Andrew, NP REFERRING PROVIDER: Persons, [place], [place]  END OF SESSION:  PT End of Session - 07/26/23 0940     Visit Number 5    Number of Visits 13    Date for PT Re-Evaluation 08/17/23    Authorization Type [place] MEDICAID [employer]    Progress Note Due on Visit 10    PT Start Time 0938    PT Stop Time 1013    PT Time Calculation (min) 35 min    Equipment Utilized During Treatment Other (comment)   performed at seated level, gait belt not indicated   Activity Tolerance Patient tolerated treatment well;Patient limited by pain    Behavior During Therapy [facility] for tasks assessed/performed            Past Medical History:  Diagnosis Date   Abnormal MRI, cervical spine    Anemia    Elective abortion    Heart murmur    Herpes    Infection    UTI   Multiple sclerosis (HCC)    Pregnancy induced hypertension    2001   Vitamin D deficiency    Past Surgical History:  Procedure Laterality Date   CESAREAN SECTION N/A 03/23/2020   Procedure: CESAREAN SECTION;  Surgeon: Tilda Burrow, MD;  Location: [facility] LD ORS;  Service: Obstetrics;  Laterality: N/A;  Arrest of Dilation   CHOLECYSTECTOMY     LAPAROSCOPY N/A 06/09/2016   Procedure: LAPAROSCOPY OPERATIVE;  Surgeon: Tereso Newcomer, MD;  Location: [facility] ORS;  Service: Gynecology;  Laterality: N/A;  ectopic    THERAPEUTIC ABORTION     x 4   TUBAL LIGATION     UNILATERAL SALPINGECTOMY Left 06/09/2016   Procedure: UNILATERAL SALPINGECTOMY, REMOVAL OF IUD;  Surgeon: Tereso Newcomer, MD;  Location: [facility] ORS;  Service: Gynecology;  Laterality: Left;   WISDOM TOOTH EXTRACTION     Patient Active Problem List   Diagnosis Date Noted   Urinary urgency 11/04/2022   Gait abnormality 11/04/2022   Other fatigue 11/04/2022   Anemia 11/04/2022   Relapsing  remitting multiple sclerosis (HCC) 08/26/2022   Neuropathic pain 09/11/2020   Cesarean delivery, delivered, current hospitalization 03/23/2020   Alpha thalassemia silent carrier 12/18/2019   AMA (advanced maternal age) multigravida 35+ 09/17/2019   Maternal obesity affecting pregnancy, antepartum 09/17/2019   Chronic hypertension affecting pregnancy 09/17/2019   Supervision of other normal pregnancy, antepartum 09/10/2019   GBS bacteriuria 08/19/2019   Multiple sclerosis (HCC) 04/30/2019   Cervical myelopathy (HCC) 04/12/2019   Paresthesia 04/12/2019   Vitamin D deficiency 07/05/2017   Iron deficiency anemia 07/05/2017   Herpes simplex antibody positive 04/15/2016   Recurrent boils 04/13/2016   Family history of breast cancer in first degree relative 04/13/2016   Smoker 07/03/2014    ONSET DATE: 05/31/2023 (referral date)  REFERRING DIAG: G35 (ICD-10-CM) - Multiple sclerosis (HCC) M25.561,M25.562,G89.29 (ICD-10-CM) - Chronic pain of both knees  THERAPY DIAG:  Unsteadiness on feet  Other abnormalities of gait and mobility  Muscle weakness (generalized)  Chronic pain of left knee  Chronic pain of right knee  Rationale for Evaluation and Treatment: Rehabilitation  SUBJECTIVE:  SUBJECTIVE STATEMENT: Patient reports that she has been going through it with a death in the family and other things. She has been laying around more because of this. Patient reports that she got a warm feeling in her hip like the dye feeling in an MRI; she's not sure what caused it. She reports that she is behind in her treatments due to insurance but she does have one coming up. Patient reports that she has had some diarehia over the last few days but no other bowel or bladder changes.   Pt accompanied by:  self  PERTINENT HISTORY: Relapsing-remitting MS diagnosed 5-6 years ago, fatigue, anemia, urinary urgency, cervical myelopathy, cholecystectomy, hx of cesarean section, tubal ligation  PAIN:  Are you having pain? Yes: NPRS scale: 6-7/10 Pain location: low back and R hip Pain description: achy, throbbing Aggravating factors: unsure Relieving factors: when I take my medication ; not when sitting  PRECAUTIONS: Fall  RED FLAGS: Bowel or bladder incontinence: No   WEIGHT BEARING RESTRICTIONS: No  FALLS: Has patient fallen in last 6 months? Yes. Number of falls 1; October 19th or 20th, coming down steps at home, was moving too fast, had never fallen down the steps before, R foot may have fallen asleep, did not hit head, twisted ankle, went to ED, received a boot and crutches  LIVING ENVIRONMENT: Lives with: lives with their family; 6 children, 2 adults 67 and 22 Lives in: House/apartment Stairs: Yes: Internal: 1 flight steps; on left going up and External: 1 steps; none Has following equipment at home: Single point cane, Walker - 2 wheeled, Crutches, and shower chair  PLOF: Requires assistive device for independence and Needs assistance with ADLs "it depends on the day", has not worked since MS relapse in January  PATIENT GOALS: "get stronger", "get off this cane"  OBJECTIVE:  Note: Objective measures were completed at Evaluation unless otherwise noted.  DIAGNOSTIC FINDINGS:  DG Knee Complete 4 Views Right 05/19/23 FINDINGS: No evidence of fracture, dislocation, or joint effusion. The alignment and joint spaces are normal. No evidence of arthropathy or other focal bone abnormality. Soft tissues are unremarkable.  DG Knee Complete 4 Views Left 05/19/23 FINDINGS: No evidence of fracture, dislocation, or joint effusion. The alignment and joint spaces are preserved. No evidence of arthropathy or other focal bone abnormality. Soft tissues are unremarkable.  DG Foot Complete Right  05/29/23 IMPRESSION: 1. Mild dorsal soft tissue swelling along the forefoot. No fracture or malalignment is identified. 2. Small plantar calcaneal spur.  DG Ankle Complete Right 05/29/23 IMPRESSION: 1. No acute bony findings. 2. Plantar calcaneal spur.   COGNITION: Overall cognitive status: Within functional limits for tasks assessed  TODAY'S TREATMENT:                                                                                                                               TherAct Vitals:   07/26/23 0944 07/26/23 1011  BP: (!) 151/94 [name])  151/93  Pulse: 75 65  Assessed on RUE while seated at start of therapy session. BP elevated but within limits for participation in therapy session; Discussed follow up with PCP for BP management, patient verbalized understanding  Discussed support groups or behavior health for patient; patient in agreement to therapist reaching out to patient and PCP about behavior health and BP  TherEx: Seated Hamstring Stretch - 3 sets - 30 seconds hold Seated Piriformis Stretch  - 3 sets - 30 seconds hold  Seated PWR!: Seated on low mat with 4" step under feet for increased stability, mirroring therapist, for trunk control and lumbar mobility PWR! up 1 x 10 reps PWR! Rock 2 x 5 reps PWR! twist 2 x 5 reps  PATIENT EDUCATION: Education details: continue HEP, added to HEP Person educated: Patient Education method: Explanation, Demonstration, Tactile cues, Verbal cues, and Handouts Education comprehension: verbalized understanding, returned demonstration, and needs further education  HOME EXERCISE PROGRAM: Access Code: [email or web address] URL: [email or web address] Date: 07/08/2023 Prepared by: Maryruth Eve  Exercises - Seated Hamstring Stretch  - 1 x daily - 7 x weekly - 3 sets - 30 seconds hold - Seated Piriformis Stretch  - 1 x daily - 7 x weekly - 3 sets - 30 seconds hold - Seated Long Arc Quad  - 1 x daily - 7 x weekly - 3 sets - 10  reps - Clamshell  - 1 x daily - 7 x weekly - 3 sets - 5-10 reps - Supine March  - 1 x daily - 7 x weekly - 3 sets - 10 reps - Seated Flexion Stretch with Swiss Ball  - 1 x daily - 7 x weekly - 1 sets - 5 reps - 30 sec hold - Seated Thoracic Flexion and Rotation with Swiss Ball  - 1 x daily - 7 x weekly - 1 sets - 5 reps - 30 sec hold  Seated PWR moves daily PWR! up 2 x 5 reps PWR! Rock 2 x 5 reps PWR! twist 2 x 5 reps  GOALS: Goals reviewed with patient? No  SHORT TERM GOALS: Target date: 07/13/2023  Pt will be independent with initial HEP for improved strength, balance, transfers and gait.  Baseline: Goal status: MET  2.  Pt will improve 5 x STS to less than or equal to 60 seconds to demonstrate improved functional strength and transfer efficiency.   Baseline: 72.31 seconds, BUE support, CGA, pt's pain 8/10 (06/22/23), 41.66 sec BUE support (12/11) Goal status: MET  3.  Pt will improve gait velocity to at least 1.24 ft/sec w/ LRAD for improved gait efficiency and performance at Mod-I level   Baseline:  0.92 ft/sec, SPC, close SBA (06/22/23), 0.8 ft/sec SPC SBA (12/11) Goal status: NOT MET  4.  Patient will improve TUG score with LRAD to 30 seconds to indicate clinically significant progress towards a decreased risk of falls and improved mobility.  Baseline: 35.72 seconds with SPC + CGA, 40.25 sec with SPC and SBA (12/11) Goal status: NOT MET  LONG TERM GOALS: Target date: 08/03/2023   Pt will be independent with final HEP for improved strength, balance, transfers and gait.  Baseline:  Goal status: INITIAL  2.  Pt will improve 5 x STS to less than or equal to 45 seconds to demonstrate improved functional strength and transfer efficiency.   Baseline: 72.31 seconds, BUE support, CGA, pt's pain 8/10 (06/22/23), 41.66 sec BUE support (12/11) Goal status: INITIAL  3.  Pt will improve gait velocity to  at least 1.57 ft/sec w/ LRAD for improved gait efficiency and performance at  Mod-I level   Baseline: 0.92 ft/sec, SPC, close SBA (06/22/23), 0.8 ft/sec SPC SBA (12/11) Goal status: INITIAL  4.  Patient will improve TUG score to 27 seconds with LRAD or less to indicate a decreased risk of falls and demonstrate improved overall mobility.   Baseline: 35.72 seconds with SPC + CGA, 40.25 sec with SPC and SBA (12/11) Goal status: INITIAL  5. Patient will improve WOMAC score too 67% impairment or less indicate a clinically important improvement in LE function.   Baseline: 78% impairment Goal status: INITIAL  6.  Bilateral hip abduction MMT to improve to at least 3/5 to demonstrate improved functional strength and decreased torque to bilateral knees. Baseline: 2/5 bilaterally (06/22/23) Goal status: INITIAL  7.  Bilateral hip IR and ER ROM to improve by at least 10* to demonstrate improved functional ROM and muscle lengths. Baseline: R IR ~5*, R ER ~10*, L IR ~10*, L ER ~20* (06/22/23) Goal status: INITIAL  ASSESSMENT:  CLINICAL IMPRESSION: Emphasis of skilled PT session on continuing to work on gentle therex for pain management. Patient continues to demonstrate elevated BP readings and recommended follow up with PCP for management. Introduced seated PWR! Moves for mobility and gentle stretch. Patient tolerated well with a few breaks for fatigue management. Continue POC.   OBJECTIVE IMPAIRMENTS: Abnormal gait, decreased activity tolerance, decreased balance, decreased endurance, decreased knowledge of condition, decreased knowledge of use of DME, decreased mobility, difficulty walking, decreased ROM, decreased strength, hypomobility, impaired perceived functional ability, increased muscle spasms, impaired flexibility, impaired sensation, improper body mechanics, postural dysfunction, obesity, and pain.   ACTIVITY LIMITATIONS: carrying, lifting, bending, sitting, standing, squatting, sleeping, stairs, transfers, bathing, dressing, hygiene/grooming, locomotion level, and  caring for others  PARTICIPATION LIMITATIONS: meal prep, cleaning, laundry, medication management, driving, shopping, community activity, and occupation  PERSONAL FACTORS: Age, Fitness, Sex, Time since onset of injury/illness/exacerbation, and 3+ comorbidities:   Relapsing-remitting MS diagnosed 5-6 years ago, fatigue, anemia, urinary urgency, cervical myelopathy, cholecystectomy, hx of cesarean section, tubal ligation are also affecting patient's functional outcome.   REHAB POTENTIAL: Fair time since onset of pain, MS relapsing-remiting  CLINICAL DECISION MAKING: Stable/uncomplicated  EVALUATION COMPLEXITY: Low  PLAN:  PT FREQUENCY: 2x/week  PT DURATION: 6 weeks  PLANNED INTERVENTIONS: 97164- PT Re-evaluation, 97110-Therapeutic exercises, 97530- Therapeutic activity, 97112- Neuromuscular re-education, 97535- Self Care, 40981- Manual therapy, [id number]- Gait training, [phone]- Orthotic Fit/training, [phone]- Canalith repositioning, 97014- Electrical stimulation (unattended), [id number]- Electrical stimulation (manual), [id number]- Traction (mechanical), [id number]- Wound care (first 20 sq cm), 97598- Wound care (each additional 20 sq cm), Patient/Family education, Balance training, Stair training, Taping, Dry Needling, Joint mobilization, Joint manipulation, Spinal manipulation, Spinal mobilization, Scar mobilization, Vestibular training, Visual/preceptual remediation/compensation, DME instructions, Cryotherapy, and Moist heat  PLAN FOR NEXT SESSION: Progress HEP for BLE and core strengthening, balance, NMR, emphasis on hip abduction strengthening (2/5 MMT) as well as mobility for hip IR and ER; STS w/ anterior weight shift from elevated mat table, quad sets, SLR, prone hamstring curls, calf stretches, quad stretches, piriformis stretch, lumbar spine rotation, cycling    For all possible CPT codes, reference the Planned Interventions line above.     Check all conditions that are expected to impact treatment:  {Conditions expected to impact treatment:Neurological condition and/or seizures and Presence of Medical Equipment   If treatment provided at initial evaluation, no treatment charged due to lack of authorization.      Carmelia Bake,  PT, DPT  07/26/2023, 10:16 AM

## 2023-07-26 NOTE — Telephone Encounter (Signed)
Dr. Tanda Rockers,  Zuli Mealor was treated for physical therapy on 12/23.  The patient would benefit from behavior health evaluation for reports of not feeling like getting out of bed and other emotional concerns outside of PT. Patient also presenting with consistently elevated BP readings in PT; if you see fit and could follow up with patient, that would be greatly appreciated.  If you agree, please place an order in [facility] in [facility] or fax the order to [phone].  Thank you, Maryruth Eve, PT, DPT   [facility] [address] [place], [place]  [address] Phone:  [phone] Fax:  [phone]

## 2023-07-29 ENCOUNTER — Ambulatory Visit: Payer: Medicaid Other | Admitting: Physical Therapy

## 2023-08-02 ENCOUNTER — Ambulatory Visit: Payer: Medicaid Other | Admitting: Physical Therapy

## 2023-08-02 DIAGNOSIS — G35 Multiple sclerosis: Secondary | ICD-10-CM | POA: Diagnosis not present

## 2023-08-05 ENCOUNTER — Ambulatory Visit: Payer: Medicaid Other | Attending: Nurse Practitioner | Admitting: Physical Therapy

## 2023-08-05 DIAGNOSIS — R2689 Other abnormalities of gait and mobility: Secondary | ICD-10-CM | POA: Insufficient documentation

## 2023-08-05 DIAGNOSIS — M25562 Pain in left knee: Secondary | ICD-10-CM | POA: Insufficient documentation

## 2023-08-05 DIAGNOSIS — G8929 Other chronic pain: Secondary | ICD-10-CM | POA: Insufficient documentation

## 2023-08-05 DIAGNOSIS — R2681 Unsteadiness on feet: Secondary | ICD-10-CM | POA: Insufficient documentation

## 2023-08-05 DIAGNOSIS — M6281 Muscle weakness (generalized): Secondary | ICD-10-CM | POA: Insufficient documentation

## 2023-08-05 DIAGNOSIS — M25561 Pain in right knee: Secondary | ICD-10-CM | POA: Insufficient documentation

## 2023-08-05 NOTE — Therapy (Signed)
 [facility] [address] [place], [place], [address] Phone: [phone]   Fax:  [phone]  Patient Details  Name: Denise Moses MRN: [id number] Date of Birth: 05-Jul-1981 Referring Provider:  Oley Bascom RAMAN, NP  Encounter Date: 08/05/2023  Arrived no charge. Pt arrives to her appointment and reports she is experiencing symptoms as side effects from having her MS infusion on 08/03/23. Pt reports that typically the infusion would be spread out over several visits but she had the infusion all at one time as she had to wait on insurance approval to get the infusion. Pt reports she is having a headache, rapid heart rate, feeling overly heat-sensitive, etc. Pt agreeable to mark session as arrived no charge and will return next week once she is feeling better, appointments added on to end of POC to make up for missed appointments this week.   Waddell Southgate, PT Waddell Southgate, PT, DPT, CSRS  08/05/2023, 11:11 AM  [facility] [address] [place], [place], [address] Phone: [phone]   Fax:  [phone]

## 2023-08-09 ENCOUNTER — Telehealth: Payer: Self-pay | Admitting: Family Medicine

## 2023-08-09 NOTE — Telephone Encounter (Signed)
 Pt said last infusion was push into one hour, side effects;, headaches, diarrhea for 6 days, rapid heart beat, hot flashes to the point feeling like going to pass out. These are the reactions from infusion being pushed into one hour. Do not want to do that again. Would like a call back

## 2023-08-09 NOTE — Telephone Encounter (Signed)
I called patient to discuss.  No answer, left a voicemail asking her to call us back. 

## 2023-08-10 ENCOUNTER — Ambulatory Visit: Payer: Medicaid Other | Admitting: Physical Therapy

## 2023-08-12 ENCOUNTER — Ambulatory Visit: Payer: Medicaid Other | Admitting: Physical Therapy

## 2023-08-16 NOTE — Telephone Encounter (Signed)
 Spoke with pt and she stated that her last Briumvi  450mg  infusion on 08/01/2023 was given to her too fast when she normally gets 3 hours worth of infusion. Pt states they ran her for 1 hour and due to that she hasn't been able to eat or keep anything down. Pt has been taking Ensure since 08/05/2023 due to not being able to eat and hold down food. Pt states that she has lost weight. Pt states that she has been weak. Pt wants to go back on her regular 3hr infusion she was on previously.

## 2023-08-16 NOTE — Telephone Encounter (Signed)
 Called pt again to discuss. LVM.

## 2023-08-17 ENCOUNTER — Ambulatory Visit: Payer: Medicaid Other | Admitting: Physical Therapy

## 2023-08-17 DIAGNOSIS — M6281 Muscle weakness (generalized): Secondary | ICD-10-CM

## 2023-08-17 DIAGNOSIS — R2689 Other abnormalities of gait and mobility: Secondary | ICD-10-CM

## 2023-08-17 DIAGNOSIS — G8929 Other chronic pain: Secondary | ICD-10-CM

## 2023-08-17 DIAGNOSIS — R2681 Unsteadiness on feet: Secondary | ICD-10-CM

## 2023-08-17 NOTE — Therapy (Signed)
 OUTPATIENT PHYSICAL THERAPY NEURO TREATMENT - ARRIVED NO CHARGE and DISCHARGE NOTE   Patient Name: Denise Moses MRN: [id number] DOB:July 10, 1981, 43 y.o., female Today's Date: 08/17/2023   PCP: Jerrlyn Morel, NP REFERRING PROVIDER: Persons, Norma Beckers, [place]  PHYSICAL THERAPY DISCHARGE SUMMARY  Visits from Start of Care: 5  Current functional level related to goals / functional outcomes: Mod I   Remaining deficits: Ongoing pain and impaired function   Education / Equipment: Educated pt to seek emergency medical care due to ongoing symptoms, can resume PT services once she is more medically stable   Patient agrees to discharge. Patient goals were not met. Patient is being discharged due to a change in medical status.   END OF SESSION:  PT End of Session - 08/17/23 1156     Visit Number 5    Number of Visits 13    Date for PT Re-Evaluation 08/17/23    Authorization Type [place] MEDICAID [employer]    Progress Note Due on Visit 10    PT Start Time 1156   pt arrived late   PT Stop Time 1201   arrived no charge   PT Time Calculation (min) 5 min    Equipment Utilized During Treatment --   performed at seated level, gait belt not indicated   Activity Tolerance Treatment limited secondary to medical complications (Comment);Other (comment)   experiencing side effects of MS medication infusion, recommended she go to ED   Behavior During Therapy [facility] for tasks assessed/performed             Past Medical History:  Diagnosis Date   Abnormal MRI, cervical spine    Anemia    Elective abortion    Heart murmur    Herpes    Infection    UTI   Multiple sclerosis (HCC)    Pregnancy induced hypertension    2001   Vitamin D  deficiency    Past Surgical History:  Procedure Laterality Date   CESAREAN SECTION N/A 03/23/2020   Procedure: CESAREAN SECTION;  Surgeon: Albino Hum, MD;  Location: [facility] LD ORS;  Service: Obstetrics;  Laterality: N/A;  Arrest of  Dilation   CHOLECYSTECTOMY     LAPAROSCOPY N/A 06/09/2016   Procedure: LAPAROSCOPY OPERATIVE;  Surgeon: Julianne Octave, MD;  Location: [facility] ORS;  Service: Gynecology;  Laterality: N/A;  ectopic    THERAPEUTIC ABORTION     x 4   TUBAL LIGATION     UNILATERAL SALPINGECTOMY Left 06/09/2016   Procedure: UNILATERAL SALPINGECTOMY, REMOVAL OF IUD;  Surgeon: Julianne Octave, MD;  Location: [facility] ORS;  Service: Gynecology;  Laterality: Left;   WISDOM TOOTH EXTRACTION     Patient Active Problem List   Diagnosis Date Noted   Urinary urgency 11/04/2022   Gait abnormality 11/04/2022   Other fatigue 11/04/2022   Anemia 11/04/2022   Relapsing remitting multiple sclerosis (HCC) 08/26/2022   Neuropathic pain 09/11/2020   Cesarean delivery, delivered, current hospitalization 03/23/2020   Alpha thalassemia silent carrier 12/18/2019   AMA (advanced maternal age) multigravida 35+ 09/17/2019   Maternal obesity affecting pregnancy, antepartum 09/17/2019   Chronic hypertension affecting pregnancy 09/17/2019   Supervision of other normal pregnancy, antepartum 09/10/2019   GBS bacteriuria 08/19/2019   Multiple sclerosis (HCC) 04/30/2019   Cervical myelopathy (HCC) 04/12/2019   Paresthesia 04/12/2019   Vitamin D  deficiency 07/05/2017   Iron deficiency anemia 07/05/2017   Herpes simplex antibody positive 04/15/2016   Recurrent boils 04/13/2016   Family history of  breast cancer in first degree relative 04/13/2016   Smoker 07/03/2014    ONSET DATE: 05/31/2023 (referral date)  REFERRING DIAG: G35 (ICD-10-CM) - Multiple sclerosis (HCC) M25.561,M25.562,G89.29 (ICD-10-CM) - Chronic pain of both knees  THERAPY DIAG:  Unsteadiness on feet  Other abnormalities of gait and mobility  Muscle weakness (generalized)  Chronic pain of left knee  Chronic pain of right knee  Rationale for Evaluation and Treatment: Rehabilitation  SUBJECTIVE:                                                                                                                                                                                              SUBJECTIVE STATEMENT: Pt still not feeling well from her MS IV infusion, spoke to Dr. Gracie Lav yesterday who recommended that she go to the hospital. Pt still not able to eat, taking Ensure and having ongoing diarrhea.  Pt accompanied by: self  PERTINENT HISTORY: Relapsing-remitting MS diagnosed 5-6 years ago, fatigue, anemia, urinary urgency, cervical myelopathy, cholecystectomy, hx of cesarean section, tubal ligation  PAIN:  Are you having pain? Yes: NPRS scale: 6-7/10 Pain location: low back and R hip Pain description: achy, throbbing Aggravating factors: unsure Relieving factors: when I take my medication ; not when sitting  PRECAUTIONS: Fall  RED FLAGS: Bowel or bladder incontinence: No   WEIGHT BEARING RESTRICTIONS: No  FALLS: Has patient fallen in last 6 months? Yes. Number of falls 1; October 19th or 20th, coming down steps at home, was moving too fast, had never fallen down the steps before, R foot may have fallen asleep, did not hit head, twisted ankle, went to ED, received a boot and crutches  LIVING ENVIRONMENT: Lives with: lives with their family; 6 children, 2 adults 80 and 37 Lives in: House/apartment Stairs: Yes: Internal: 1 flight steps; on left going up and External: 1 steps; none Has following equipment at home: Single point cane, Walker - 2 wheeled, Crutches, and shower chair  PLOF: Requires assistive device for independence and Needs assistance with ADLs "it depends on the day", has not worked since MS relapse in January  PATIENT GOALS: "get stronger", "get off this cane"  OBJECTIVE:  Note: Objective measures were completed at Evaluation unless otherwise noted.  DIAGNOSTIC FINDINGS:  DG Knee Complete 4 Views Right 05/19/23 FINDINGS: No evidence of fracture, dislocation, or joint effusion. The alignment and joint spaces are normal. No evidence of  arthropathy or other focal bone abnormality. Soft tissues are unremarkable.  DG Knee Complete 4 Views Left 05/19/23 FINDINGS: No evidence of fracture, dislocation, or joint effusion. The alignment and joint spaces are preserved. No evidence of  arthropathy or other focal bone abnormality. Soft tissues are unremarkable.  DG Foot Complete Right 05/29/23 IMPRESSION: 1. Mild dorsal soft tissue swelling along the forefoot. No fracture or malalignment is identified. 2. Small plantar calcaneal spur.  DG Ankle Complete Right 05/29/23 IMPRESSION: 1. No acute bony findings. 2. Plantar calcaneal spur.   COGNITION: Overall cognitive status: Within functional limits for tasks assessed  TODAY'S TREATMENT:                                                                                                                               TherAct Deferred any treatment this date as pt still experiencing ongoing side effects from her MS infusion a few weeks ago. This therapist 2nded Dr. Torrance Freestone recommendation that patient go to ED due to her ongoing diarrhea and inability to keep down solid food. Educated patient that she would be d/c from PT services at this time and she can obtain a new referral once she is medically stable and able to return.   PATIENT EDUCATION: Education details: got to ED due to ongoing nausea/diarrhea for several weeks; will d/c from PT at this time until medically stable Person educated: Patient Education method: Explanation Education comprehension: verbalized understanding  HOME EXERCISE PROGRAM: Access Code: [email or web address] URL: [email or web address] Date: 07/08/2023 Prepared by: Camella Cave  Exercises - Seated Hamstring Stretch  - 1 x daily - 7 x weekly - 3 sets - 30 seconds hold - Seated Piriformis Stretch  - 1 x daily - 7 x weekly - 3 sets - 30 seconds hold - Seated Long Arc Quad  - 1 x daily - 7 x weekly - 3 sets - 10 reps - Clamshell  - 1 x daily - 7 x weekly  - 3 sets - 5-10 reps - Supine March  - 1 x daily - 7 x weekly - 3 sets - 10 reps - Seated Flexion Stretch with Swiss Ball  - 1 x daily - 7 x weekly - 1 sets - 5 reps - 30 sec hold - Seated Thoracic Flexion and Rotation with Swiss Ball  - 1 x daily - 7 x weekly - 1 sets - 5 reps - 30 sec hold  Seated PWR moves daily PWR! up 2 x 5 reps PWR! Rock 2 x 5 reps PWR! twist 2 x 5 reps  GOALS: Goals reviewed with patient? No  SHORT TERM GOALS: Target date: 07/13/2023  Pt will be independent with initial HEP for improved strength, balance, transfers and gait.  Baseline: Goal status: MET  2.  Pt will improve 5 x STS to less than or equal to 60 seconds to demonstrate improved functional strength and transfer efficiency.   Baseline: 72.31 seconds, BUE support, CGA, pt's pain 8/10 (06/22/23), 41.66 sec BUE support (12/11) Goal status: MET  3.  Pt will improve gait velocity to at least 1.24 ft/sec w/ LRAD for improved gait efficiency and performance at Mod-I level   Baseline:  0.92 ft/sec, SPC, close SBA (06/22/23), 0.8 ft/sec SPC SBA (12/11) Goal status: NOT MET  4.  Patient will improve TUG score with LRAD to 30 seconds to indicate clinically significant progress towards a decreased risk of falls and improved mobility.  Baseline: 35.72 seconds with SPC + CGA, 40.25 sec with SPC and SBA (12/11) Goal status: NOT MET  LONG TERM GOALS: Target date: 08/03/2023   Pt will be independent with final HEP for improved strength, balance, transfers and gait.  Baseline:  Goal status: NOT MET  2.  Pt will improve 5 x STS to less than or equal to 45 seconds to demonstrate improved functional strength and transfer efficiency.   Baseline: 72.31 seconds, BUE support, CGA, pt's pain 8/10 (06/22/23), 41.66 sec BUE support (12/11) Goal status: NOT MET  3.  Pt will improve gait velocity to at least 1.57 ft/sec w/ LRAD for improved gait efficiency and performance at Mod-I level   Baseline: 0.92 ft/sec, SPC, close  SBA (06/22/23), 0.8 ft/sec SPC SBA (12/11) Goal status: NOT MET  4.  Patient will improve TUG score to 27 seconds with LRAD or less to indicate a decreased risk of falls and demonstrate improved overall mobility.   Baseline: 35.72 seconds with SPC + CGA, 40.25 sec with SPC and SBA (12/11) Goal status: NOT MET  5. Patient will improve WOMAC score too 67% impairment or less indicate a clinically important improvement in LE function.   Baseline: 78% impairment Goal status: NOT MET  6.  Bilateral hip abduction MMT to improve to at least 3/5 to demonstrate improved functional strength and decreased torque to bilateral knees. Baseline: 2/5 bilaterally (06/22/23) Goal status: NOT MET  7.  Bilateral hip IR and ER ROM to improve by at least 10* to demonstrate improved functional ROM and muscle lengths. Baseline: R IR ~5*, R ER ~10*, L IR ~10*, L ER ~20* (06/22/23) Goal status: NOT MET  *UNABLE TO REASSESS LONG TERM GOALS DUE TO ONGOING SIDE EFFECTS FROM MS INFUSION  ASSESSMENT:  CLINICAL IMPRESSION: Session arrived no charge as patient experiencing ongoing side effects from her MS infusion that she had a few weeks ago. Pt's neurologist has recommended that she go to the ED as she is still not able to eat solid food and is experiencing ongoing diarrhea, this therapist in agreement and encouraged patient to go to the ED. Pt met 0/7 LTG as they were unable to be assessed due to her ongoing symptoms. Pt to be d/c from OPPT services at this time and can obtain a new referral and return to PT once she is more medically stable.   OBJECTIVE IMPAIRMENTS: Abnormal gait, decreased activity tolerance, decreased balance, decreased endurance, decreased knowledge of condition, decreased knowledge of use of DME, decreased mobility, difficulty walking, decreased ROM, decreased strength, hypomobility, impaired perceived functional ability, increased muscle spasms, impaired flexibility, impaired sensation, improper  body mechanics, postural dysfunction, obesity, and pain.   ACTIVITY LIMITATIONS: carrying, lifting, bending, sitting, standing, squatting, sleeping, stairs, transfers, bathing, dressing, hygiene/grooming, locomotion level, and caring for others  PARTICIPATION LIMITATIONS: meal prep, cleaning, laundry, medication management, driving, shopping, community activity, and occupation  PERSONAL FACTORS: Age, Fitness, Sex, Time since onset of injury/illness/exacerbation, and 3+ comorbidities:   Relapsing-remitting MS diagnosed 5-6 years ago, fatigue, anemia, urinary urgency, cervical myelopathy, cholecystectomy, hx of cesarean section, tubal ligation are also affecting patient's functional outcome.   REHAB POTENTIAL: Fair time since onset of pain, MS relapsing-remiting  CLINICAL DECISION MAKING: Stable/uncomplicated  EVALUATION COMPLEXITY: Low  For all possible CPT codes, reference the Planned Interventions line above.     Check all conditions that are expected to impact treatment: {Conditions expected to impact treatment:Neurological condition and/or seizures and Presence of Medical Equipment   If treatment provided at initial evaluation, no treatment charged due to lack of authorization.      Gola Bribiesca, PT Lorita Rosa, PT, DPT, CSRS   08/17/2023, 12:02 PM

## 2023-08-18 ENCOUNTER — Ambulatory Visit: Payer: Self-pay | Admitting: Nurse Practitioner

## 2023-08-19 ENCOUNTER — Ambulatory Visit: Payer: Medicaid Other | Admitting: Physical Therapy

## 2023-08-29 ENCOUNTER — Other Ambulatory Visit: Payer: Self-pay

## 2023-08-29 DIAGNOSIS — D509 Iron deficiency anemia, unspecified: Secondary | ICD-10-CM

## 2023-08-30 ENCOUNTER — Inpatient Hospital Stay: Payer: Medicaid Other | Admitting: Hematology

## 2023-08-30 ENCOUNTER — Inpatient Hospital Stay: Payer: Medicaid Other

## 2023-09-28 ENCOUNTER — Ambulatory Visit: Payer: Self-pay | Admitting: Nurse Practitioner

## 2023-10-03 ENCOUNTER — Inpatient Hospital Stay: Payer: Medicaid Other | Admitting: Hematology

## 2023-10-03 ENCOUNTER — Inpatient Hospital Stay: Payer: Medicaid Other | Attending: Hematology

## 2023-11-17 ENCOUNTER — Ambulatory Visit: Payer: Medicaid Other | Admitting: Neurology

## 2023-11-22 ENCOUNTER — Ambulatory Visit (INDEPENDENT_AMBULATORY_CARE_PROVIDER_SITE_OTHER): Payer: Self-pay | Admitting: Nurse Practitioner

## 2023-11-22 ENCOUNTER — Encounter: Payer: Self-pay | Admitting: Nurse Practitioner

## 2023-11-22 VITALS — BP 137/85 | HR 72 | Temp 97.1°F | Wt 234.0 lb

## 2023-11-22 DIAGNOSIS — M25551 Pain in right hip: Secondary | ICD-10-CM | POA: Diagnosis not present

## 2023-11-22 DIAGNOSIS — G8929 Other chronic pain: Secondary | ICD-10-CM | POA: Diagnosis not present

## 2023-11-22 MED ORDER — PREDNISONE 10 MG (21) PO TBPK: ORAL_TABLET | ORAL | 0 refills | Status: AC

## 2023-11-22 MED ORDER — METHYLPREDNISOLONE NA SUC (PF) 40 MG IJ SOLR
40.0000 mg | Freq: Once | INTRAMUSCULAR | Status: AC
  Administered 2023-11-22: 40 mg via INTRAMUSCULAR

## 2023-11-22 MED ORDER — KETOROLAC TROMETHAMINE 30 MG/ML IJ SOLN
30.0000 mg | Freq: Once | INTRAMUSCULAR | Status: AC
  Administered 2023-11-22: 30 mg via INTRAMUSCULAR

## 2023-11-22 NOTE — Patient Instructions (Signed)
 You were given Toradol  30 mg injection and Solu-Medrol  40 mg injection in the office today Please start taking prednisone  from tomorrow, follow the instructions on the packaging and take medication with food.  Do not take Aleve  or ibuprofen  while taking prednisone  Also encourage the use of heating pad and Tylenol  650 mg every 6 hours as needed    1. Chronic right hip pain (Primary)  - predniSONE  (STERAPRED UNI-PAK 21 TAB) 10 MG (21) TBPK tablet; Take as Instructed on the packaging  Dispense: 1 each; Refill: 0   It is important that you exercise regularly at least 30 minutes 5 times a week as tolerated  Think about what you will eat, plan ahead. Choose " clean, green, fresh or frozen" over canned, processed or packaged foods which are more sugary, salty and fatty. 70 to 75% of food eaten should be vegetables and fruit. Three meals at set times with snacks allowed between meals, but they must be fruit or vegetables. Aim to eat over a 12 hour period , example 7 am to 7 pm, and STOP after  your last meal of the day. Drink water ,generally about 64 ounces per day, no other drink is as healthy. Fruit juice is best enjoyed in a healthy way, by EATING the fruit.  Thanks for choosing [facility] we consider it a privelige to serve you.
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
## 2023-11-22 NOTE — Addendum Note (Signed)
 Addended by: Julian Obey on: 11/22/2023 04:54 PM   Modules accepted: Orders

## 2023-11-22 NOTE — Assessment & Plan Note (Signed)
 Given Toradol  30 mg and Solu-Medrol  40 mg in the office today Start tapered prednisone  tomorrow, take medication with food Told to avoid taking Aleve  or ibuprofen  while taking prednisone  Follow-up with orthopedics if no improvement after completion of the full course of steroid   - predniSONE  (STERAPRED UNI-PAK 21 TAB) 10 MG (21) TBPK tablet; Take as Instructed on the packaging  Dispense: 1 each; Refill: 0

## 2023-12-19 ENCOUNTER — Telehealth: Payer: Self-pay | Admitting: Neurology

## 2023-12-19 ENCOUNTER — Encounter: Payer: Self-pay | Admitting: Neurology

## 2023-12-19 NOTE — Telephone Encounter (Signed)
 VM box full, sent MyChart msg and cx letter.

## 2023-12-19 NOTE — Telephone Encounter (Signed)
 Pt called back in regards to Schedule appt   Appt Scheduled

## 2024-01-20 DIAGNOSIS — G35 Multiple sclerosis: Secondary | ICD-10-CM | POA: Diagnosis not present

## 2024-01-26 ENCOUNTER — Ambulatory Visit: Admitting: Neurology

## 2024-01-30 ENCOUNTER — Emergency Department (HOSPITAL_COMMUNITY): Admission: EM | Admit: 2024-01-30 | Discharge: 2024-01-30 | Disposition: A | Discharge status: Home / Self Care

## 2024-01-30 ENCOUNTER — Encounter (HOSPITAL_COMMUNITY): Payer: Self-pay | Admitting: *Deleted

## 2024-01-30 ENCOUNTER — Other Ambulatory Visit: Payer: Self-pay

## 2024-01-30 DIAGNOSIS — M546 Pain in thoracic spine: Secondary | ICD-10-CM | POA: Diagnosis not present

## 2024-01-30 LAB — URINALYSIS, ROUTINE W REFLEX MICROSCOPIC
Bilirubin Urine: NEGATIVE
Glucose, UA: NEGATIVE mg/dL
Hgb urine dipstick: NEGATIVE
Ketones, ur: NEGATIVE mg/dL
Leukocytes,Ua: NEGATIVE
Nitrite: NEGATIVE
Protein, ur: NEGATIVE mg/dL
Specific Gravity, Urine: 1.018 (ref 1.005–1.030)
pH: 7 (ref 5.0–8.0)

## 2024-01-30 LAB — CBC
HCT: 39.1 % (ref 36.0–46.0)
Hemoglobin: 12.5 g/dL (ref 12.0–15.0)
MCH: 27.3 pg (ref 26.0–34.0)
MCHC: 32 g/dL (ref 30.0–36.0)
MCV: 85.4 fL (ref 80.0–100.0)
Platelets: 291 10*3/uL (ref 150–400)
RBC: 4.58 MIL/uL (ref 3.87–5.11)
RDW: 16.5 % — ABNORMAL HIGH (ref 11.5–15.5)
WBC: 8.1 10*3/uL (ref 4.0–10.5)
nRBC: 0 % (ref 0.0–0.2)

## 2024-01-30 LAB — BASIC METABOLIC PANEL WITH GFR
Anion gap: 6 (ref 5–15)
BUN: 10 mg/dL (ref 6–20)
CO2: 24 mmol/L (ref 22–32)
Calcium: 9.1 mg/dL (ref 8.9–10.3)
Chloride: 106 mmol/L (ref 98–111)
Creatinine, Ser: 0.73 mg/dL (ref 0.44–1.00)
GFR, Estimated: >60 mL/min (ref >60)
Glucose, Bld: 82 mg/dL (ref 70–99)
Potassium: 3.8 mmol/L (ref 3.5–5.1)
Sodium: 136 mmol/L (ref 135–145)

## 2024-01-30 MED ORDER — DEXAMETHASONE 4 MG PO TABS: 8.0000 mg | ORAL_TABLET | Freq: Once | ORAL | Status: DC

## 2024-01-30 MED ORDER — LIDOCAINE 5 % EX PTCH
1.0000 | MEDICATED_PATCH | Freq: Once | CUTANEOUS | Status: DC
  Administered 2024-01-30: 1 via TRANSDERMAL
  Filled 2024-01-30: qty 1

## 2024-01-30 MED ORDER — METHOCARBAMOL 500 MG PO TABS: 500.0000 mg | ORAL_TABLET | Freq: Two times a day (BID) | ORAL | 0 refills | Status: AC | PRN

## 2024-01-30 MED ORDER — OXYCODONE-ACETAMINOPHEN 5-325 MG PO TABS
1.0000 | ORAL_TABLET | Freq: Once | ORAL | Status: AC
  Administered 2024-01-30: 1 via ORAL
  Filled 2024-01-30: qty 1

## 2024-01-30 MED ORDER — DEXAMETHASONE 4 MG PO TABS
4.0000 mg | ORAL_TABLET | Freq: Once | ORAL | Status: AC
  Administered 2024-01-30: 4 mg via ORAL
  Filled 2024-01-30: qty 1

## 2024-01-30 MED ORDER — LIDOCAINE 5 % EX PTCH
1.0000 | MEDICATED_PATCH | CUTANEOUS | 0 refills | Status: AC
Start: 2024-01-30 — End: ?

## 2024-01-30 MED ORDER — KETOROLAC TROMETHAMINE 15 MG/ML IJ SOLN
15.0000 mg | Freq: Once | INTRAMUSCULAR | Status: AC
  Administered 2024-01-30: 15 mg via INTRAMUSCULAR
  Filled 2024-01-30: qty 1

## 2024-01-30 MED ORDER — METHOCARBAMOL 500 MG PO TABS
500.0000 mg | ORAL_TABLET | Freq: Once | ORAL | Status: AC
  Administered 2024-01-30: 500 mg via ORAL
  Filled 2024-01-30: qty 1

## 2024-01-30 MED ORDER — ACETAMINOPHEN 325 MG PO TABS
650.0000 mg | ORAL_TABLET | Freq: Once | ORAL | Status: AC
  Administered 2024-01-30: 650 mg via ORAL
  Filled 2024-01-30: qty 2

## 2024-01-30 NOTE — Discharge Instructions (Signed)
 Take Tylenol  alternating with ibuprofen  with dosages as directed on the packaging every 4 hours for baseline pain control.  We also prescribed lidocaine  patches and muscle relaxers that she can take for additional pain relief.  Please follow-up with your primary doctor.  Return immediately if develop fevers, chills, numbness in your genital area, difficulty voiding, urinary incontinence, weakness in your legs or any new or worsening symptoms that are concerning to you.

## 2024-01-30 NOTE — ED Triage Notes (Signed)
 C/o severe left lower back pain onset Fri. States she has  MS and she thinks this is a flare up , patient states its very painful to sit or lay down. Patient is tearful

## 2024-01-30 NOTE — ED Provider Notes (Signed)
 [facility] AT [facility] Provider Note   CSN: [id number] Arrival date & time: 01/30/24  9282     Patient presents with: Back Pain   Denise Moses is a 43 y.o. female.   Is a 43 year old female presents emergency department with left back pain that is gradually started on Friday and worsened over the weekend.  Reports that she sometimes have some issues on her right side, but never left.  No fevers.  No trauma.  No constitutional symptoms.  No neurodeficits.  No saddle anesthesia.  No difficulty voiding.  Was recently on steroids, but otherwise not on immunosuppressives.  Has tried taking gabapentin  and naproxen  without improvement.   Back Pain      Prior to Admission medications   Medication Sig Start Date End Date Taking? Authorizing Provider  lidocaine  (LIDODERM ) 5 % Place 1 patch onto the skin daily. Remove & Discard patch within 12 hours or as directed by MD 01/30/24  Yes Neysa Caron PARAS, DO  methocarbamol  (ROBAXIN ) 500 MG tablet Take 1 tablet (500 mg total) by mouth 2 (two) times daily as needed for muscle spasms. 01/30/24  Yes Neysa Caron PARAS, DO  b complex vitamins capsule Take 1 capsule by mouth daily. 06/01/23   Onesimo Emaline Brink, MD  DULoxetine  (CYMBALTA ) 60 MG capsule Take 1 capsule (60 mg total) by mouth daily. 11/04/22   Onita Duos, MD  FeFum-FePo-FA-B Cmp-C-Zn-Mn-Cu (TANDEM PLUS) 162-115.2-1 MG CAPS Take 1 tablet by mouth daily. Patient not taking: Reported on 11/22/2023 11/12/22   Onita Duos, MD  hydrochlorothiazide  (MICROZIDE ) 12.5 MG capsule Take 1 capsule (12.5 mg total) by mouth daily. 05/19/23   Oley Bascom RAMAN, NP  naproxen  (NAPROSYN ) 500 MG tablet Take 500 mg by mouth 2 (two) times daily with a meal. Patient not taking: Reported on 11/22/2023    [provider]  NIFEdipine  (PROCARDIA  XL/NIFEDICAL-XL) 90 MG 24 hr tablet Take 1 tablet (90 mg total) by mouth daily. Patient not taking: Reported on 04/28/2021 05/08/20  05/08/21  Myrna Camelia HERO, NP  ondansetron  (ZOFRAN ) 4 MG tablet Take 2 tablets (8 mg total) by mouth every 8 (eight) hours as needed for nausea or vomiting. Patient not taking: Reported on 11/22/2023 12/10/22   Arloa Suzen RAMAN, NP  oxybutynin  (DITROPAN  XL) 10 MG 24 hr tablet Take 1 tablet (10 mg total) by mouth at bedtime. Patient not taking: Reported on 11/22/2023 11/04/22   Onita Duos, MD  potassium chloride  SA (KLOR-CON  M) 20 MEQ tablet Take 1 tablet (20 mEq total) by mouth 2 (two) times daily for 3 days. Patient not taking: Reported on 11/22/2023 12/11/22 12/14/22  Arloa Suzen RAMAN, NP  predniSONE  (STERAPRED UNI-PAK 21 TAB) 10 MG (21) TBPK tablet Take as Instructed on the packaging 11/23/23   Paseda, Folashade R, FNP  Vitamin D , Ergocalciferol , 50000 units CAPS Take 1 tablet by mouth once a week. Patient not taking: Reported on 11/22/2023 06/01/23   Onesimo Emaline Brink, MD    Allergies: Patient has no known allergies.    Review of Systems  Musculoskeletal:  Positive for back pain.    Updated Vital Signs BP 126/67   Pulse 68   Temp 97.9 F (36.6 C) (Temporal)   Resp 15   Ht 5' 6 (1.676 m)   Wt 93 kg   SpO2 100%   BMI 33.09 kg/m   Physical Exam Vitals and nursing note reviewed.  Constitutional:      General: She is not in acute distress.  Appearance: She is obese. She is not toxic-appearing.  HENT:     Head: Normocephalic.     Mouth/Throat:     Mouth: Mucous membranes are moist.   Eyes:     Conjunctiva/sclera: Conjunctivae normal.    Cardiovascular:     Rate and Rhythm: Normal rate.  Pulmonary:     Effort: Pulmonary effort is normal.     Breath sounds: Normal breath sounds.  Abdominal:     General: Abdomen is flat. There is no distension.     Tenderness: There is no abdominal tenderness. There is no guarding or rebound.   Musculoskeletal:     Comments: No midline spinal tenderness.  Has some tenderness to the musculature at the thoracolumbar junction on the  right-hand side.  No rash.  5 out of 5 plantarflexion, dorsiflexion.  Increased pain with left straight leg raise   Skin:    Capillary Refill: Capillary refill takes less than 2 seconds.   Neurological:     Mental Status: She is alert.   Psychiatric:        Mood and Affect: Mood normal.        Behavior: Behavior normal.     (all labs ordered are listed, but only abnormal results are displayed) Labs Reviewed  CBC - Abnormal; Notable for the following components:      Result Value   RDW 16.5 (*)    All other components within normal limits  URINALYSIS, ROUTINE W REFLEX MICROSCOPIC - Abnormal; Notable for the following components:   APPearance HAZY (*)    All other components within normal limits  BASIC METABOLIC PANEL WITH GFR    EKG: None  Radiology: No results found.   Procedures   Medications Ordered in the ED  lidocaine  (LIDODERM ) 5 % 1 patch (1 patch Transdermal Patch Applied 01/30/24 0907)  acetaminophen  (TYLENOL ) tablet 650 mg (650 mg Oral Given 01/30/24 0903)  methocarbamol  (ROBAXIN ) tablet 500 mg (500 mg Oral Given 01/30/24 0903)  oxyCODONE -acetaminophen  (PERCOCET/ROXICET) 5-325 MG per tablet 1 tablet (1 tablet Oral Given 01/30/24 0902)  ketorolac  (TORADOL ) 15 MG/ML injection 15 mg (15 mg Intramuscular Given 01/30/24 0905)  dexamethasone  (DECADRON ) tablet 4 mg (4 mg Oral Given 01/30/24 0903)    Clinical Course as of 01/30/24 1558  Mon Jan 30, 2024  1100 Feeling improved.  Was able to ambulate to the bathroom with some assistance.  Lab workup reassuring, no fever tachycardia leukocytosis.  No significant metabolic derangement.  Normal kidney function.  UA not indicative of UTI/pyelonephritis or stone.  Low suspicion for cauda equina central cord.  Likely spasm.  Will discharge with lidocaine  patches and Robaxin .  Follow-up with primary doctors. [TY]    Clinical Course User Index [TY] Neysa Caron PARAS, DO                                 Medical Decision Making This  is a 43 year old female history of MS, obesity, chronic right hip pain presenting emergency department with left back pain.  She is afebrile nontachycardic, slightly hypertensive.  On exam appears uncomfortable, but nontoxic.  Reassuring exam without red flags.  Will get basic labs and UA.  Suspect MSK etiology.  Treated with multimodal pain medications.  See course for further MDM and final disposition  Amount and/or Complexity of Data Reviewed Labs: ordered. Radiology:     Details: Considered imaging, however low suspicion for cauda equina/conus medullaris based off exam.  No midline tenderness or fever.  Low suspicion for spinal epidural abscess  Risk OTC drugs. Prescription drug management. Decision regarding hospitalization. Diagnosis or treatment significantly limited by social determinants of health.      Final diagnoses:  Acute right-sided thoracic back pain    ED Discharge Orders          Ordered    lidocaine  (LIDODERM ) 5 %  Every 24 hours        01/30/24 1102    methocarbamol  (ROBAXIN ) 500 MG tablet  2 times daily PRN        01/30/24 1102               Kassia Demarinis, Caron PARAS, DO 01/30/24 1558

## 2024-01-30 NOTE — Discharge Planning (Signed)
 RNCM consulted regarding pt leaving cane in uber driver car and not being able to locate uber driver at time of discharge.  RNCM offered a donated cane to pt for use. Pt graciously accepted.  No additional RNCM needs identified.  Terez Montee J. Debarah, BSN, RN, NCM  Transitions of Care  Nurse Case Manager  [facility] Emergency Departments  Operative Services  [phone]

## 2024-02-07 ENCOUNTER — Telehealth: Payer: Self-pay

## 2024-02-07 NOTE — Telephone Encounter (Signed)
 Copied from CRM [id number]. Topic: Clinical - Medical Advice >> Feb 07, 2024  1:39 PM Sophia H wrote: Reason for CRM: Patient called in to schedule an ER follow up - was seen for back pain and was given meds while in the hospital but is still dealing with the pain. I scheduled her first available in clinic with her PCP 07/21, wanting to know if there is any advice or medication that can be called in to get her to that appointment.

## 2024-02-08 ENCOUNTER — Other Ambulatory Visit: Payer: Self-pay | Admitting: Nurse Practitioner

## 2024-02-08 MED ORDER — CYCLOBENZAPRINE HCL 10 MG PO TABS: 10.0000 mg | ORAL_TABLET | Freq: Three times a day (TID) | ORAL | 0 refills | Status: AC | PRN

## 2024-02-16 ENCOUNTER — Encounter: Payer: Self-pay | Admitting: Neurology

## 2024-02-16 ENCOUNTER — Ambulatory Visit (INDEPENDENT_AMBULATORY_CARE_PROVIDER_SITE_OTHER): Admitting: Neurology

## 2024-02-16 VITALS — BP 129/85 | HR 80 | Ht 66.0 in | Wt 237.0 lb

## 2024-02-16 DIAGNOSIS — G35 Multiple sclerosis: Secondary | ICD-10-CM | POA: Diagnosis not present

## 2024-02-16 DIAGNOSIS — M25512 Pain in left shoulder: Secondary | ICD-10-CM

## 2024-02-16 DIAGNOSIS — D649 Anemia, unspecified: Secondary | ICD-10-CM | POA: Diagnosis not present

## 2024-02-16 DIAGNOSIS — R3915 Urgency of urination: Secondary | ICD-10-CM | POA: Diagnosis not present

## 2024-02-16 DIAGNOSIS — R5383 Other fatigue: Secondary | ICD-10-CM | POA: Diagnosis not present

## 2024-02-16 DIAGNOSIS — R269 Unspecified abnormalities of gait and mobility: Secondary | ICD-10-CM

## 2024-02-16 MED ORDER — MELOXICAM 7.5 MG PO TABS: 7.5000 mg | ORAL_TABLET | Freq: Every day | ORAL | 3 refills | Status: AC | PRN

## 2024-02-16 MED ORDER — OXYBUTYNIN CHLORIDE ER 10 MG PO TB24: 10.0000 mg | ORAL_TABLET | Freq: Every day | ORAL | 3 refills | Status: AC

## 2024-02-16 MED ORDER — DULOXETINE HCL 60 MG PO CPEP: 60.0000 mg | ORAL_CAPSULE | Freq: Every day | ORAL | 3 refills | Status: AC

## 2024-02-16 NOTE — Progress Notes (Signed)
 Chief Complaint  Patient presents with   Follow-up    Room 15 Pt is alone Relapsing remitting multiple sclerosis , she is having pain on left side she stated pain given neck and back given muscle relax by PCP going on for about 1 week       ASSESSMENT AND PLAN  Denise Moses is a 43 y.o. female Relapsing Remitting multiple sclerosis   Gait abnormality Urinary urgency New onset left shoulder pain    Previous MRIs showed evidence of moderate brain and cervical involvement Has been on Briumvi since Dec 30 2022, last infusion was on January 20, 2024, no flare up since treatment Lab evaluation including CD 20 MRI brain and cervical w/wo Cymbalta  titrating to 60mg  for body achy pain, depression/anxiety Oxybutynin  xL refill X ray of left shoulder, mobic  as needed. Refer to physical therapy    insurance will not cover ocrelizumab, switch to Briumvi according to the protocol.  DIAGNOSTIC DATA (LABS, IMAGING, TESTING) - I reviewed patient records, labs, notes, testing and imaging myself where available. JC virus antibody was positive with titer 2.96 on November 04, 2022  MEDICAL HISTORY:  Denise Moses follow-up for relapsing remitting multiple sclerosis  In July 2020, she had a sudden onset neck pain, radiating pain to bilateral shoulder, bilateral fingertips mainly 2, 3, fourth fingertips, also developed mild weak grip, at the peak of her difficulty, she also described difficulty walking, urinary urgency, incontinence, she was given a prescription of steroid, and gabapentin , which has helped her symptoms some, but still not back to her baseline, she still has bilateral upper extremity paresthesia, no longer have significant gait abnormality, lower extremity paresthesia, she denies visual loss.   MRI of cervical spine on February 27, 2019, signal abnormality within the cord at C2,  MRI of the brain without contrast April 29, 2019, multiple T2/flair hyperintensity in the  pons, hemisphere, consistent with relapsing remitting multiple sclerosis,   Laboratory evaluation on April 12, 2019 showed decreased vitamin D  14.4, normal or negative angiotensin-converting enzyme level, ANA, 10, iron level, copper  level was mildly elevated 225, hepatitis panel, A1c was 5.5, normal electrophoresis CPK was 24, ESR was 112, normal TSH, C-reactive protein was mildly elevated 11, normal folic acid , HIV, RPR, B12,  Her follow-up and treatment was also interrupted by unexpected pregnancy, abortion on May 04, 2019, often lost follow-up, she was treated with Tecfidera for short period of time in February 2022, she become pregnant again in 2022, had a C-section with healthy boy on March 23, 2021  Repeat MRI of brain, cervical with without contrast in March 2022, compared to previous scan, multiple T2/FLAIR hyperintensity foci in cerebral hemisphere, consistent with chronic demyelinating plaque, compared to previous scan in 2020, there was 2-3 new supratentorium lesions, no contrast-enhancement.  MRI of cervical spine showed lesion within the central posterior spinal cord adjacent to C2, no significant canal foraminal narrowing,   She has relapse in Feb 2024, to the point of difficulty walking, urinary and bowel incontinence, multiple ER visit, was given solumedrol and  prednisone  tapering,  This flareup has really alarmed her, she wants to continue her neurological care and treatments  JC virus titer was 2.96 in April 2024, not a good candidate for Tysabri,  MRI of brain w/wo in Feb 2024. Progressive changes of demyelination affecting the brain as outlined above, when compared to the most recent study of 10/02/2020. No lesions show restricted diffusion or contrast enhancement however. New lesions are seen in the  right temporal lobe, adjacent to the superior margin of the body of the right lateral ventricle, and within the deep white matter of the left parietal and occipital lobe.  New lesions are slightly more conspicuous on the sagittal cube FLAIR imaging, more so than the axial standard FLAIR imaging.   MRI thoracic spine w/wo 1. No evidence of demyelination in the thoracic region. 2. Minimal, non-compressive disc bulges from T7-8 through T11-12.   MRI of lumbar in Feb 2024 1. Normal MRI appearance of the conus medullaris and nerve roots of the cauda equina. No evidence for cord compression. 2. Left foraminal to extraforaminal disc protrusion at L3-4, contacting the exiting left L3 nerve root. 3. Additional minimal noncompressive disc bulging at L4-5 and L5-S1 without stenosis or impingement. 4. Mild bilateral facet hypertrophy throughout the lumbar spine.  She was started on Briumvi infusion at [facility] since Dec 30 2022, overall tolerating it well, most recent infusion was in June 2025, complains some diarrhea afterwards, now recovered, she has 6 children, youngest is only 55 years old,   She has no clear flareup over the past 1 year, but complains diffuse body achy pain, new onset left shoulder pain, continue to deal with urinary and bowel bladder frequency  PHYSICAL EXAM:   Vitals:   02/16/24 0905  BP: 129/85  Pulse: 80  Weight: 237 lb (107.5 kg)  Height: 5' 6 (1.676 m)   Body mass index is 38.25 kg/m.  PHYSICAL EXAMNIATION:  Gen: NAD, conversant, well nourised, well groomed                     Cardiovascular: Regular rate rhythm, no peripheral edema, warm, nontender. Eyes: Conjunctivae clear without exudates or hemorrhage Neck: Supple, no carotid bruits. Pulmonary: Clear to auscultation bilaterally   NEUROLOGICAL EXAM:  MENTAL STATUS: Speech/cognition: Depressed looking middle-age female, awake, alert, oriented to history taking and casual conversation CRANIAL NERVES: CN II: Visual fields are full to confrontation. Pupils are round equal and briskly reactive to light. CN III, IV, VI: extraocular movement are normal. No ptosis. CN V:  Facial sensation is intact to light touch CN VII: Face is symmetric with normal eye closure  CN VIII: Hearing is normal to causal conversation. CN IX, X: Phonation is normal. CN XI: Head turning and shoulder shrug are intact  MOTOR: Limited range of motion of left shoulder, left shoulder tenderness upon deep palpitation  REFLEXES: Reflexes are 2+ and symmetric at the biceps, triceps, knees, and ankles. Plantar responses are flexor.  SENSORY: Intact to light touch, pinprick and vibratory sensation are intact in fingers and toes.  COORDINATION: There is no trunk or limb dysmetria noted.  GAIT/STANCE: need to push up to get up from seated position, stiff, unsteady gait.  REVIEW OF SYSTEMS:  Full 14 system review of systems performed and notable only for as above All other review of systems were negative.   ALLERGIES: No Known Allergies  HOME MEDICATIONS: Current Outpatient Medications  Medication Sig Dispense Refill   b complex vitamins capsule Take 1 capsule by mouth daily.     cyclobenzaprine  (FLEXERIL ) 10 MG tablet Take 1 tablet (10 mg total) by mouth 3 (three) times daily as needed for muscle spasms. 30 tablet 0   DULoxetine  (CYMBALTA ) 60 MG capsule Take 1 capsule (60 mg total) by mouth daily. 90 capsule 3   FeFum-FePo-FA-B Cmp-C-Zn-Mn-Cu (TANDEM PLUS) 162-115.2-1 MG CAPS Take 1 tablet by mouth daily. 90 capsule 3   hydrochlorothiazide  (MICROZIDE ) 12.5 MG capsule  Take 1 capsule (12.5 mg total) by mouth daily. 90 capsule 2   lidocaine  (LIDODERM ) 5 % Place 1 patch onto the skin daily. Remove & Discard patch within 12 hours or as directed by MD 14 patch 0   methocarbamol  (ROBAXIN ) 500 MG tablet Take 1 tablet (500 mg total) by mouth 2 (two) times daily as needed for muscle spasms. 20 tablet 0   naproxen  (NAPROSYN ) 500 MG tablet Take 500 mg by mouth 2 (two) times daily with a meal.     NIFEdipine  (PROCARDIA  XL/NIFEDICAL-XL) 90 MG 24 hr tablet Take 1 tablet (90 mg total) by mouth  daily. 90 tablet 3   ondansetron  (ZOFRAN ) 4 MG tablet Take 2 tablets (8 mg total) by mouth every 8 (eight) hours as needed for nausea or vomiting. 12 tablet 0   oxybutynin  (DITROPAN  XL) 10 MG 24 hr tablet Take 1 tablet (10 mg total) by mouth at bedtime. 30 tablet 11   potassium chloride  SA (KLOR-CON  M) 20 MEQ tablet Take 1 tablet (20 mEq total) by mouth 2 (two) times daily for 3 days. 6 tablet 0   predniSONE  (STERAPRED UNI-PAK 21 TAB) 10 MG (21) TBPK tablet Take as Instructed on the packaging 1 each 0   Vitamin D , Ergocalciferol , 50000 units CAPS Take 1 tablet by mouth once a week. 12 capsule 0   No current facility-administered medications for this visit.    PAST MEDICAL HISTORY: Past Medical History:  Diagnosis Date   Abnormal MRI, cervical spine    Anemia    Elective abortion    Heart murmur    Herpes    Infection    UTI   Multiple sclerosis (HCC)    Pregnancy induced hypertension    2001   Vitamin D  deficiency     PAST SURGICAL HISTORY: Past Surgical History:  Procedure Laterality Date   CESAREAN SECTION N/A 03/23/2020   Procedure: CESAREAN SECTION;  Surgeon: Edsel Norleen GAILS, MD;  Location: [facility] LD ORS;  Service: Obstetrics;  Laterality: N/A;  Arrest of Dilation   CHOLECYSTECTOMY     LAPAROSCOPY N/A 06/09/2016   Procedure: LAPAROSCOPY OPERATIVE;  Surgeon: Gloris DELENA Hugger, MD;  Location: [facility] ORS;  Service: Gynecology;  Laterality: N/A;  ectopic    THERAPEUTIC ABORTION     x 4   TUBAL LIGATION     UNILATERAL SALPINGECTOMY Left 06/09/2016   Procedure: UNILATERAL SALPINGECTOMY, REMOVAL OF IUD;  Surgeon: Gloris DELENA Hugger, MD;  Location: [facility] ORS;  Service: Gynecology;  Laterality: Left;   WISDOM TOOTH EXTRACTION      FAMILY HISTORY: Family History  Problem Relation Age of Onset   Breast cancer Mother 15       was a smoker, alcohol drinker in early life    Breast cancer Maternal Aunt    Cancer Maternal Grandfather        unsure of type   Other Father        unsure of  history   Breast cancer Maternal Aunt     SOCIAL HISTORY: Social History   Socioeconomic History   Marital status: Single    Spouse name: Not on file   Number of children: 6   Years of education: some college   Highest education level: Not on file  Occupational History   Occupation: Unemployed  Tobacco Use   Smoking status: Former    Current packs/day: 0.00    Average packs/day: 0.3 packs/day for 10.0 years (2.5 ttl pk-yrs)    Types: Cigarettes  Start date: 03/2009    Quit date: 03/2019    Years since quitting: 4.9   Smokeless tobacco: Never   Tobacco comments:    2-3 cigs/day  Vaping Use   Vaping status: Never Used  Substance and Sexual Activity   Alcohol use: Not Currently    Comment: social drinker   Drug use: Yes    Frequency: 5.0 times per week    Types: Marijuana    Comment: last used before delivery   Sexual activity: Not Currently    Partners: Male    Birth control/protection: Surgical  Other Topics Concern   Not on file  Social History Narrative   Lives at home with children.   Right-handed.   No daily caffeine use.   Social Drivers of Corporate investment banker Strain: Low Risk  (04/08/2020)   Overall Financial Resource Strain (CARDIA)    Difficulty of Paying Living Expenses: Not hard at all  Food Insecurity: No Food Insecurity (11/11/2022)   Hunger Vital Sign    Worried About Running Out of Food in the Last Year: Never true    Ran Out of Food in the Last Year: Never true  Transportation Needs: No Transportation Needs (11/11/2022)   PRAPARE - Administrator, Civil Service (Medical): No    Lack of Transportation (Non-Medical): No  Physical Activity: Insufficiently Active (05/20/2020)   Exercise Vital Sign    Days of Exercise per Week: 2 days    Minutes of Exercise per Session: 30 min  Stress: Stress Concern Present (04/08/2020)   [employer] of Occupational Health - Occupational Stress Questionnaire    Feeling of Stress : To some  extent  Social Connections: Moderately Isolated (04/08/2020)   Social Connection and Isolation Panel    Frequency of Communication with Friends and Family: More than three times a week    Frequency of Social Gatherings with Friends and Family: More than three times a week    Attends Religious Services: More than 4 times per year    Active Member of [employer] or Organizations: No    Attends Banker Meetings: Never    Marital Status: Never married  Intimate Partner Violence: Not At Risk (11/11/2022)   Humiliation, Afraid, Rape, and Kick questionnaire    Fear of Current or Ex-Partner: No    Emotionally Abused: No    Physically Abused: No    Sexually Abused: No      Modena Callander, M.D. Ph.D.  [facility] [address], [address] [place], [place] [address] Ph: [phone] Fax: [phone]  CC:  Oley Bascom RAMAN, NP [phone] [place],  [place] [address]  Nichols, Tonya S, NP

## 2024-02-20 ENCOUNTER — Telehealth: Payer: Self-pay | Admitting: Neurology

## 2024-02-20 ENCOUNTER — Inpatient Hospital Stay: Payer: Self-pay | Admitting: Nurse Practitioner

## 2024-02-20 NOTE — Telephone Encounter (Signed)
 MRI brain: [facility] medicaid auth: [id number] exp. 02/20/24-04/05/24  MRI cervical: [employer] medicaid shara: [id number] exp. 02/20/24-04/05/24 sent to GI [phone]

## 2024-02-24 ENCOUNTER — Inpatient Hospital Stay: Payer: Self-pay | Admitting: Nurse Practitioner

## 2024-03-30 ENCOUNTER — Other Ambulatory Visit

## 2024-04-12 ENCOUNTER — Ambulatory Visit

## 2024-04-19 ENCOUNTER — Ambulatory Visit: Attending: Neurology | Admitting: Physical Therapy

## 2024-04-19 NOTE — Therapy (Incomplete)
 OUTPATIENT PHYSICAL THERAPY NEURO EVALUATION   Patient Name: Denise Moses MRN: [id number] DOB:10/09/1980, 43 y.o., female Today's Date: 04/19/2024   PCP: [facility] REFERRING PROVIDER: Onita Duos, MD  END OF SESSION:   Past Medical History:  Diagnosis Date   Abnormal MRI, cervical spine    Anemia    Elective abortion    Heart murmur    Herpes    Infection    UTI   Multiple sclerosis (HCC)    Pregnancy induced hypertension    2001   Vitamin D  deficiency    Past Surgical History:  Procedure Laterality Date   CESAREAN SECTION N/A 03/23/2020   Procedure: CESAREAN SECTION;  Surgeon: Edsel Norleen GAILS, MD;  Location: [facility];  Service: Obstetrics;  Laterality: N/A;  Arrest of Dilation   CHOLECYSTECTOMY     LAPAROSCOPY N/A 06/09/2016   Procedure: LAPAROSCOPY OPERATIVE;  Surgeon: Gloris DELENA Hugger, MD;  Location: [facility];  Service: Gynecology;  Laterality: N/A;  ectopic    THERAPEUTIC ABORTION     x 4   TUBAL LIGATION     UNILATERAL SALPINGECTOMY Left 06/09/2016   Procedure: UNILATERAL SALPINGECTOMY, REMOVAL OF IUD;  Surgeon: Gloris DELENA Hugger, MD;  Location: [facility];  Service: Gynecology;  Laterality: Left;   WISDOM TOOTH EXTRACTION     Patient Active Problem List   Diagnosis Date Noted   Acute pain of left shoulder 02/16/2024   Chronic right hip pain 11/22/2023   Urinary urgency 11/04/2022   Gait abnormality 11/04/2022   Other fatigue 11/04/2022   Anemia 11/04/2022   Relapsing remitting multiple sclerosis (HCC) 08/26/2022   Neuropathic pain 09/11/2020   Cesarean delivery, delivered, current hospitalization 03/23/2020   Alpha thalassemia silent carrier 12/18/2019   AMA (advanced maternal age) multigravida 35+ 09/17/2019   Maternal obesity affecting pregnancy, antepartum 09/17/2019   Chronic hypertension affecting pregnancy 09/17/2019   Supervision of other normal pregnancy, antepartum 09/10/2019   GBS bacteriuria 08/19/2019   Multiple sclerosis (HCC) 04/30/2019    Cervical myelopathy (HCC) 04/12/2019   Paresthesia 04/12/2019   Vitamin D  deficiency 07/05/2017   Iron deficiency anemia 07/05/2017   Herpes simplex antibody positive 04/15/2016   Recurrent boils 04/13/2016   Family history of breast cancer in first degree relative 04/13/2016   Smoker 07/03/2014    ONSET DATE: 02/16/2024 (referral date)  REFERRING DIAG: G35 (ICD-10-CM) - Relapsing remitting multiple sclerosis (HCC) M25.512 (ICD-10-CM) - Acute pain of left shoulder R26.9 (ICD-10-CM) - Gait abnormality R39.15 (ICD-10-CM) - Urinary urgency R53.83 (ICD-10-CM) - Other fatigue D64.9 (ICD-10-CM) - Anemia, unspecified type  THERAPY DIAG:  No diagnosis found.  Rationale for Evaluation and Treatment: Rehabilitation  SUBJECTIVE:  SUBJECTIVE STATEMENT: ***  Pt accompanied by: {accompnied:27141}  PERTINENT HISTORY: ***  PAIN:  Are you having pain? {OPRCPAIN:27236}  PRECAUTIONS: {Therapy precautions:24002}  RED FLAGS: {PT Red Flags:29287}   WEIGHT BEARING RESTRICTIONS: {Yes ***/No:24003}  FALLS: Has patient fallen in last 6 months? {fallsyesno:27318}  LIVING ENVIRONMENT: Lives with: {[facility] lives with:25569::lives with their family} Lives in: {Lives in:25570} Stairs: {opstairs:27293} Has following equipment at home: {Assistive devices:23999}  PLOF: {PLOF:24004}  PATIENT GOALS: ***  OBJECTIVE:  Note: Objective measures were completed at Evaluation unless otherwise noted.  DIAGNOSTIC FINDINGS: ***  COGNITION: Overall cognitive status: {cognition:24006}   SENSATION: {sensation:27233}  COORDINATION: ***  EDEMA:  {edema:24020}  MUSCLE TONE: {LE tone:25568}  MUSCLE LENGTH: Hamstrings: Right *** deg; Left *** deg Debby test: Right *** deg; Left *** deg  DTRs:  {DTR  SITE:24025}  POSTURE: {posture:25561}  LOWER EXTREMITY ROM:     {AROM/PROM:27142}  Right Eval Left Eval  Hip flexion    Hip extension    Hip abduction    Hip adduction    Hip internal rotation    Hip external rotation    Knee flexion    Knee extension    Ankle dorsiflexion    Ankle plantarflexion    Ankle inversion    Ankle eversion     (Blank rows = not tested)  LOWER EXTREMITY MMT:    MMT Right Eval Left Eval  Hip flexion    Hip extension    Hip abduction    Hip adduction    Hip internal rotation    Hip external rotation    Knee flexion    Knee extension    Ankle dorsiflexion    Ankle plantarflexion    Ankle inversion    Ankle eversion    (Blank rows = not tested)  BED MOBILITY:  {bed mobility:32615:p}  TRANSFERS: {transfers eval:32620}  RAMP:  {ramp eval:32616}  CURB:  {curb eval:32617}  STAIRS: {stairs eval:32618} GAIT: Findings: {GaitneuroPT:32644::Distance walked: ***,Comments: ***}  FUNCTIONAL TESTS:  {Functional tests:24029}  PATIENT SURVEYS:  {rehab surveys:24030}                                                                                                                              TREATMENT DATE: *** PT Evaluation   PATIENT EDUCATION: Education details: *** Person educated: {Person educated:25204} Education method: {Education Method:25205} Education comprehension: {Education Comprehension:25206}  HOME EXERCISE PROGRAM: ***  GOALS: Goals reviewed with patient? {yes/no:20286}  SHORT TERM GOALS: Target date: ***  *** Baseline: Goal status: INITIAL  2.  *** Baseline:  Goal status: INITIAL  3.  *** Baseline:  Goal status: INITIAL  4.  *** Baseline:  Goal status: INITIAL  5.  *** Baseline:  Goal status: INITIAL  6.  *** Baseline:  Goal status: INITIAL  LONG TERM GOALS: Target date: ***  *** Baseline:  Goal status: INITIAL  2.  *** Baseline:  Goal status: INITIAL  3.  *** Baseline:  Goal  status:  INITIAL  4.  *** Baseline:  Goal status: INITIAL  5.  *** Baseline:  Goal status: INITIAL  6.  *** Baseline:  Goal status: INITIAL  ASSESSMENT:  CLINICAL IMPRESSION: Patient is a *** year old *** referred to Neuro [facility] for***.   Pt's PMH is significant for: *** The following deficits were present during the exam: ***. Based on ***, pt is an incr risk for falls. Pt would benefit from skilled PT to address these impairments and functional limitations to maximize functional mobility independence   OBJECTIVE IMPAIRMENTS: {opptimpairments:25111}.   ACTIVITY LIMITATIONS: {activitylimitations:27494}  PARTICIPATION LIMITATIONS: {participationrestrictions:25113}  PERSONAL FACTORS: {Personal factors:25162} are also affecting patient's functional outcome.   REHAB POTENTIAL: {rehabpotential:25112}  CLINICAL DECISION MAKING: {clinical decision making:25114}  EVALUATION COMPLEXITY: {Evaluation complexity:25115}  PLAN:  PT FREQUENCY: {rehab frequency:25116}  PT DURATION: {rehab duration:25117}  PLANNED INTERVENTIONS: {rehab planned interventions:25118::97110-Therapeutic exercises,97530- Therapeutic [phone]- Neuromuscular re-education,97535- Self Rjmz,02859- Manual therapy,Patient/Family education}  PLAN FOR NEXT SESSION: ***   Waddell Southgate, PT Waddell Southgate, PT, DPT, CSRS  04/19/2024, 7:42 AM

## 2024-04-28 ENCOUNTER — Other Ambulatory Visit

## 2024-06-04 ENCOUNTER — Encounter: Payer: Self-pay | Admitting: Radiology

## 2024-06-11 ENCOUNTER — Other Ambulatory Visit: Payer: Self-pay | Admitting: *Deleted

## 2024-06-11 ENCOUNTER — Telehealth: Payer: Self-pay | Admitting: *Deleted

## 2024-06-11 DIAGNOSIS — G35A Relapsing-remitting multiple sclerosis: Secondary | ICD-10-CM

## 2024-06-11 NOTE — Telephone Encounter (Signed)
 Orders placed to be done (which [facility] did not have any record of).  Holly with [facility] to relay to pt.

## 2024-06-11 NOTE — Progress Notes (Signed)
 This is a reorder of labs since previous 02-16-2024 labs were not collected and done.  ([facility] tech did call coporate office and check).

## 2024-06-11 NOTE — Telephone Encounter (Signed)
 Received message from [facility] that need labs on this pt.  Looks like they were collected 02-16-2024.  Sent message to [facility] to see status.

## 2024-08-09 ENCOUNTER — Other Ambulatory Visit: Payer: Self-pay | Admitting: *Deleted

## 2024-08-09 ENCOUNTER — Telehealth: Payer: Self-pay | Admitting: Neurology

## 2024-08-09 NOTE — Telephone Encounter (Signed)
" ° °  It appears these labs did not get drawn at [facility] on 02/16/24 patient on Briumvi .   Do labs need to be drawn on certain day?  Please advise  "

## 2024-08-09 NOTE — Telephone Encounter (Signed)
 Pt states she was told there are certain days of the week that her lab work can be done, pt is asking for a call from RN to know if today and possibly tomorrow are days she can come in for labs for her infusion.

## 2024-08-09 NOTE — Progress Notes (Signed)
 error

## 2024-08-10 NOTE — Telephone Encounter (Signed)
 Please have lab down before next infusion, ok to come in during working hours

## 2024-08-10 NOTE — Telephone Encounter (Signed)
 I called patient and informed her of the below. Pt states she was told by lab in July that she was not able to get them that day.   I spoke with lab tech in our office today and was informed that CD20 B cells can't be collected at the end of week. Pt said she can come on Monday 08/13/24.

## 2024-08-16 ENCOUNTER — Other Ambulatory Visit (INDEPENDENT_AMBULATORY_CARE_PROVIDER_SITE_OTHER): Payer: Self-pay

## 2024-08-16 ENCOUNTER — Other Ambulatory Visit: Payer: Self-pay

## 2024-08-16 DIAGNOSIS — G35A Relapsing-remitting multiple sclerosis: Secondary | ICD-10-CM

## 2024-08-16 DIAGNOSIS — Z0289 Encounter for other administrative examinations: Secondary | ICD-10-CM

## 2024-08-18 LAB — HEPATIC FUNCTION PANEL
ALT: 10 IU/L (ref 0–32)
AST: 12 IU/L (ref 0–40)
Albumin: 4.6 g/dL (ref 3.9–4.9)
Alkaline Phosphatase: 81 IU/L (ref 41–116)
Bilirubin Total: 0.4 mg/dL (ref 0.0–1.2)
Bilirubin, Direct: 0.14 mg/dL (ref 0.00–0.40)
Total Protein: 7.5 g/dL (ref 6.0–8.5)

## 2024-08-18 LAB — IGG, IGA, IGM
IgG (Immunoglobin G), Serum: 1571 mg/dL (ref 586–1602)
IgM (Immunoglobulin M), Srm: 128 mg/dL (ref 26–217)
Immunoglobulin A, (IgA) QN, Serum: 228 mg/dL (ref 87–352)

## 2024-08-18 LAB — CD20 B CELLS
% CD19-B Cells: 0 % — ABNORMAL LOW (ref 4.6–22.1)
% CD20-B Cells: 0 % — ABNORMAL LOW (ref 5.0–22.3)

## 2024-08-21 ENCOUNTER — Ambulatory Visit: Payer: Self-pay | Admitting: Neurology

## 2024-10-18 ENCOUNTER — Ambulatory Visit: Admitting: Neurology
